# Patient Record
Sex: Female | Born: 1955
Health system: Southern US, Community
[De-identification: ages and names within clinical notes are randomized; demographics above are authoritative.]

## PROBLEM LIST (undated history)

## (undated) DIAGNOSIS — J449 Chronic obstructive pulmonary disease, unspecified: Secondary | ICD-10-CM

## (undated) DIAGNOSIS — C3490 Malignant neoplasm of unspecified part of unspecified bronchus or lung: Secondary | ICD-10-CM

## (undated) DIAGNOSIS — Z923 Personal history of irradiation: Secondary | ICD-10-CM

## (undated) DIAGNOSIS — C801 Malignant (primary) neoplasm, unspecified: Secondary | ICD-10-CM

## (undated) DIAGNOSIS — K56609 Unspecified intestinal obstruction, unspecified as to partial versus complete obstruction: Secondary | ICD-10-CM

## (undated) HISTORY — DX: Malignant neoplasm of unspecified part of unspecified bronchus or lung: C34.90

## (undated) HISTORY — PX: COLON SURGERY: SHX602

---

## 2003-01-25 ENCOUNTER — Inpatient Hospital Stay (HOSPITAL_COMMUNITY): Admission: AD | Admit: 2003-01-25 | Discharge: 2003-01-29 | Payer: Self-pay | Admitting: Emergency Medicine

## 2003-12-09 ENCOUNTER — Ambulatory Visit: Payer: Self-pay

## 2004-01-12 ENCOUNTER — Emergency Department: Payer: Self-pay | Admitting: Emergency Medicine

## 2004-01-27 ENCOUNTER — Emergency Department: Payer: Self-pay | Admitting: Emergency Medicine

## 2004-04-07 ENCOUNTER — Emergency Department: Payer: Self-pay | Admitting: Emergency Medicine

## 2004-07-10 ENCOUNTER — Emergency Department: Payer: Self-pay | Admitting: Emergency Medicine

## 2004-07-10 ENCOUNTER — Other Ambulatory Visit: Payer: Self-pay

## 2004-10-28 ENCOUNTER — Emergency Department: Payer: Self-pay | Admitting: Emergency Medicine

## 2004-11-18 ENCOUNTER — Emergency Department: Payer: Self-pay | Admitting: Emergency Medicine

## 2005-02-01 ENCOUNTER — Ambulatory Visit: Payer: Self-pay

## 2005-04-05 ENCOUNTER — Emergency Department: Payer: Self-pay | Admitting: Emergency Medicine

## 2005-04-15 ENCOUNTER — Emergency Department: Payer: Self-pay | Admitting: Emergency Medicine

## 2005-05-29 ENCOUNTER — Emergency Department: Payer: Self-pay | Admitting: Unknown Physician Specialty

## 2005-12-12 ENCOUNTER — Emergency Department: Payer: Self-pay | Admitting: Emergency Medicine

## 2006-03-08 ENCOUNTER — Emergency Department: Payer: Self-pay | Admitting: Emergency Medicine

## 2006-05-08 ENCOUNTER — Emergency Department: Payer: Self-pay | Admitting: Emergency Medicine

## 2006-12-18 ENCOUNTER — Ambulatory Visit: Payer: Self-pay

## 2007-02-23 ENCOUNTER — Emergency Department: Payer: Self-pay | Admitting: Internal Medicine

## 2007-05-01 ENCOUNTER — Emergency Department: Payer: Self-pay | Admitting: Emergency Medicine

## 2007-05-01 ENCOUNTER — Other Ambulatory Visit: Payer: Self-pay

## 2009-03-25 ENCOUNTER — Emergency Department: Payer: Self-pay | Admitting: Emergency Medicine

## 2010-02-14 ENCOUNTER — Encounter: Payer: Self-pay | Admitting: Family Medicine

## 2010-06-11 NOTE — Consult Note (Signed)
NAME:  [name] [name], [name] [name]                            ACCOUNT NO.:  [id number]   MEDICAL RECORD NO.:  [id number]                   PATIENT TYPE:  INP   LOCATION:  5727                                 FACILITY:  [facility]   PHYSICIAN:  Iva Boop, M.D. [facility]           DATE OF BIRTH:  1955-02-14   DATE OF CONSULTATION:  01/27/2003  DATE OF DISCHARGE:                                   CONSULTATION   REQUESTING PHYSICIAN:  Medical teaching service.   REASON FOR CONSULTATION:  Right lower quadrant abdominal pain.   HISTORY:  This is a 55 year old African-American woman that has had about a  two-week history of worsening abdominal pain in the right lower quadrant.  Obvious diarrhea, though she is telling me she is moving her bowels three to  four times a day now and normally was two and formed, and her bowel  movements now are loose.  She was having some sweats at night.  Her appetite  has been okay.  There is nausea and vomiting most of the time, though  sometimes when she eats she is nauseous.  There has been no bleeding or  melena.  She has a history of arthritis of the knees, sickle cell trait, and  bilateral tubal ligation.  She was admitted to the hospital with these  problems on January 1.  The initial says blood-tinged diarrhea, however.  She denied bleeding to me.  It looks like the history is inconsistent.  The  initial admit note talks about six to seven watery bowel movements per day.   She has not traveled.  She has no sick contacts, no fever or chills reported  other than these sweats.  There is no constipation.  She has never had  problems like this before, and her family history is negative for  inflammatory bowel disease or colon cancer.   A CT scan has demonstrated inflammatory process of the cecum and terminal  ileum.  The appendix was visualized.  The radiologist did not think that  appendicitis was ruled out.  Dr. Johna Sheriff, of surgery, has reviewed that and  believes that  the appendix is normal.  He does not think she has  appendicitis.   Today. E. coli 157, Shiga toxin test has returned positive or at least EHEC  toxin has returned positive.   MEDICATIONS:  At home, she was on no medications.   DRUG ALLERGIES:  ASPIRIN and CELEBREX make her itch.   HOSPITAL MEDICATIONS:  IV Cipro and Flagyl, nicotine patch, IV Protonix,  morphine as needed, Phenergan as needed.   FAMILY HISTORY:  As above.  A sister died of cancer and had lupus  apparently.   REVIEW OF SYSTEMS:  No weight change.  No GU symptoms.  Chronic knee pain,  muscle pain in the thighs.  No respiratory difficulty.  All other systems  appear negative at this time.   PHYSICAL EXAMINATION:  GENERAL:  A middle-aged black woman in no acute  distress.  VITAL SIGNS:  Temperature 98.8, blood pressure 122/50, pulse 50.  HEENT:  Eyes anicteric.  Mouth free of lesions.  NECK:  Supple.  CHEST:  Clear.  HEART:  S1, S2.  ABDOMEN:  Soft.  She is tender to even light touch in the right lower  quadrant, right mid quadrant, and suprapubic area.  There is no mass.  The  abdomen is soft and otherwise benign to palpation.  RECTAL:  Not performed.  Heme positive on several specimens.  EXTREMITIES:  No edema.  I see no rash.  NEUROLOGIC:  She appears alert and oriented x 3.   LABORATORY DATA:  White count today 8.2, hemoglobin 11.6, hematocrit 33.  Normal LFTs.  Drug screen positive for THC and cocaine.  Stool C. difficile  negative thus far.  Fecal leukocytes negative.  EHEC toxin is positive as  mentioned.  Alcohol level was less than 5.   Initial laboratories showed a calcium of 8.2, albumin 3.2, total protein  5.6.  Initial white count 10.9, hemoglobin 14.3.  Amylase and lipase normal  or low.  Urinalysis: Small amount of ketones, 21 to 50 white cells, moderate  leukocyte esterase.   IMPRESSION:  Inflammatory changes of the ileum and cecum, etiology unclear.  Differential still includes an  enterohemorrhagic colitis.  It also includes  Crohn's disease or ischemia.  This is a somewhat unusual area for ischemia,  but given her cocaine use, I think that increases the risk.   RECOMMENDATIONS AND PLAN:  They re-incubated her culture, so would like to  see if the stool culture grows anything prior to any colonoscopy, though she  may need a colonoscopy to sort this out.  Will follow up tomorrow.                                               Iva Boop, M.D. [facility]    CEG/MEDQ  D:  01/27/2003  T:  01/27/2003  Job:  [id number]   cc:   Medical Teaching Service   Cliffton Asters, M.D.  [address] [place]  [place] [address]  Fax: [phone]

## 2010-06-11 NOTE — Discharge Summary (Signed)
NAME:  [name], [name]                            ACCOUNT NO.:  [id number]   MEDICAL RECORD NO.:  [id number]                   PATIENT TYPE:  INP   LOCATION:  5727                                 FACILITY:  [facility]   PHYSICIAN:  Donald Pore, MD                    DATE OF BIRTH:  09-25-1955   DATE OF ADMISSION:  01/25/2003  DATE OF DISCHARGE:  01/29/2003                                 DISCHARGE SUMMARY   DISCHARGE DIAGNOSES:  Escherichia coli infection.   DISCHARGE MEDICATIONS:  None.   HISTORY OF PRESENT ILLNESS:  A 55 year old female presented to the ED with  worsening abdominal pain x 2 weeks.  No vomiting.  Positive nausea.  Positive diarrhea x 1 day.   HOSPITAL COURSE:  1. Abdominal pain.  Upon admission to the ED, the patient was started     empirically on antibiotics including Cipro and Flagyl.  Stools were     cultured prior to antibiotics.  She was placed on bowel rest and given     morphine sulfate for pain.  She was hydrated with IV fluids and     electrolyte status was monitored.  She was also evaluated for GC, and     Chlamydia, and possible pregnancy.  On January 25, 2003, we obtained a CT     scan which showed a inflammatory process of her right lower quadrant,     questionable inflammatory bowel disease versus colitis.  Surgical consult     was obtained.  The patient was placed on a clear liquid diet and     consulted Dr. Johna Sheriff.  Additionally, a GI consult was obtained.  She     was seen by Dr. Leone Payor.  __________  .  January 1 - January 27, 2003, Ms.     [name]'s stools were positive for __________  1 and 2 and negative for E.     coli 157/H7.  On the day of discharge, [name] [name] was medically improved.     She denied any diarrhea, and her right lower quadrant pain was completely     resolved.  The antibiotics were discontinued, and she was discharged to     home in stable condition.   1. Polysubstance abuse.  During her hospitalization, [name] [name] tested     positive  for cocaine use.  She denied any use of the substance.  She was     counseled and encouraged to refrain from abusing cocaine.  [name] [name] did     affirm a use of tobacco products and smoking cessation consult was     obtained.  She is discharged to home with instructions regarding smoking     cessation and on a nicotine patch.  Donald Pore, MD    HP/MEDQ  D:  03/28/2003  T:  03/30/2003  Job:  [id number]

## 2010-06-11 NOTE — Consult Note (Signed)
NAME:  [name], [name]                            ACCOUNT NO.:  [id number]   MEDICAL RECORD NO.:  [id number]                   PATIENT TYPE:  INP   LOCATION:  5727                                 FACILITY:  [facility]   PHYSICIAN:  Sharlet Salina T. Hoxworth, M.D.          DATE OF BIRTH:  02-02-1955   DATE OF CONSULTATION:  DATE OF DISCHARGE:                                   CONSULTATION   CHIEF COMPLAINT:  Right lower-quadrant abdominal pain.   HISTORY OF PRESENT ILLNESS:  I was asked to see [name] [name] by Dr.  Blanch Media.  She is a 55 year old black female who as admitted to the  Medical Teaching Service for abdominal pain.  She describes the onset of  right lower-quadrant abdominal pain about two weeks ago.  It was initially  mild, but has gradually increased in intensity.  It waxes and wanes.  She  describes an aching pain without radiation.  The pain steadily increases in  intensity.  She presented to the emergency room and was admitted on January 25, 2003.  She has had some nausea over the past week, but no vomiting.  She  currently actually is hungry.  Her bowel movements had been normal with no  diarrhea, melena or hematochezia until she had CT contrast which caused some  diarrhea.  She denies any chronic GI complaints or history of any similar  symptoms.  She has been on Cipro and Flagyl since admission. Her pain  remains about the same for the past 24 hours.   PAST MEDICAL HISTORY:  1. Status post tubal ligation.  2. She had been treated for kidney stones.  3. She has sickle cell trait.  4. She has been treated for arthritis involving her knees.   CURRENT MEDICATIONS:  None.   ALLERGIES:  ASPIRIN.   SOCIAL HISTORY:  She is widowed.  She works as a Social research officer, government.  She smokes  cigarettes daily and drinks alcohol occasionally.   FAMILY HISTORY:  Negative for inflammatory bowel disease or other GI  problems.  History of hypertension and diabetes in her father.  She had a  sister die  of cancer of unknown type and had lupus.   REVIEW OF SYSTEMS:  No weight change, no fever or chills.  SKIN:  Denies  rash, infection or skin lesions.  RESPIRATORY:  Denies shortness of breath,  cough or wheezing.  GU:  Denies urinary burning or frequency.  No abnormal  bleeding or discharge.  ORTHOPEDICS:  She has chronic pain in her joints and  muscle pain in her thighs.   PHYSICAL EXAMINATION:  VITAL SIGNS:  She is afebrile.  Vital signs are  within normal limits.  GENERAL:  She is a thin, well-developed black female who does not appear in  any distress.  SKIN:  Warm and dry without rash or infection.  HEENT:  No palpable masses or thyromegaly.  Sclerae nonicteric.  Nasopharynx  clear.  LYMPH NODES:  No cervical, supraclavicular or inguinal nodes palpable.  LUNGS:  Clear to auscultation without increased work of breathing.  CARDIAC:     Regular rate and rhythm without murmurs.  No edema.  ABDOMEN:  Bowel sounds are active, nondistended.  There is well localized  but marked right lower quadrant tenderness with guarding and some local  peritoneal signs.  The remainder of her abdomen is soft and nontender.  I  can not appreciate any mass, but she does guard.  No hepatosplenomegaly is  noted.  EXTREMITIES:  No joint swelling or inflammation.  NEUROLOGIC:  Alert.  Sensory exam is grossly normal.   LABORATORY DATA:  White count 10.9 on admission, now 8.4.  Hemoglobin 11.  Electrolytes, LFT's, lipase, amylase are all within normal limits.  Urinalysis showed 7-10 white cells and a few bacteria.  Stool negative for  white cells.  Stool culture is pending.   I reviewed her CT scan.  This shows extensive inflammatory changes involving  mostly the cecum, extending somewhat up the right colon and possibly  involving a little bit of the terminal ileum.  The appendix appears to be  visualized, has contrast within it and does not appear inflamed.   ASSESSMENT/PLAN:  Acute colitis, possibly  Crohn's disease, although the  distribution is more colonic at the terminal ileum.  She has not had chronic  symptoms.  She has however had arthritis which could indicate  extraintestinal manifestation.  She certainly could have infectious or  ischemic colitis.  I strongly doubt appendicitis based on the location of  the inflammation, the concourse and the appearance of the appendix on CT.  I  do not see that she has a surgical abdomen currently.  I agree with the  current management.  For further workup, I might consider GI consultation  for colonoscopy for small bowel follow through barium x-ray to further  evaluate if this does not resolve quickly.                                               Lorne Skeens. Hoxworth, M.D.    [name] [name]  D:  01/26/2003  T:  01/27/2003  Job:  [id number]

## 2011-05-12 ENCOUNTER — Ambulatory Visit: Payer: Self-pay | Admitting: Internal Medicine

## 2011-05-24 ENCOUNTER — Emergency Department: Payer: Self-pay | Admitting: Emergency Medicine

## 2012-02-07 ENCOUNTER — Emergency Department: Payer: Self-pay | Admitting: Emergency Medicine

## 2012-02-07 LAB — URINALYSIS, COMPLETE
Bilirubin,UR: NEGATIVE
Blood: NEGATIVE
Glucose,UR: NEGATIVE mg/dL (ref 0–75)
Ketone: NEGATIVE
Nitrite: NEGATIVE
Ph: 5 (ref 4.5–8.0)
Protein: NEGATIVE
RBC,UR: 3 /HPF (ref 0–5)
Specific Gravity: 1.014 (ref 1.003–1.030)
Squamous Epithelial: 2
WBC UR: 12 /HPF (ref 0–5)

## 2012-02-07 LAB — RAPID INFLUENZA A&B ANTIGENS

## 2012-02-09 LAB — URINE CULTURE

## 2012-07-31 ENCOUNTER — Ambulatory Visit: Payer: Self-pay | Admitting: Internal Medicine

## 2013-04-17 ENCOUNTER — Emergency Department: Payer: Self-pay | Admitting: Emergency Medicine

## 2013-06-14 ENCOUNTER — Emergency Department: Payer: Self-pay | Admitting: Emergency Medicine

## 2013-06-14 LAB — URINALYSIS, COMPLETE
Bacteria: NONE SEEN
Bilirubin,UR: NEGATIVE
Glucose,UR: NEGATIVE mg/dL (ref 0–75)
Ketone: NEGATIVE
Leukocyte Esterase: NEGATIVE
Nitrite: NEGATIVE
Ph: 8 (ref 4.5–8.0)
Protein: NEGATIVE
RBC,UR: NONE SEEN /HPF (ref 0–5)
Specific Gravity: 1.008 (ref 1.003–1.030)
Squamous Epithelial: 1
WBC UR: 1 /HPF (ref 0–5)

## 2013-06-14 LAB — COMPREHENSIVE METABOLIC PANEL
Albumin: 3.5 g/dL (ref 3.4–5.0)
Alkaline Phosphatase: 56 U/L
Anion Gap: 3 — ABNORMAL LOW (ref 7–16)
BUN: 6 mg/dL — ABNORMAL LOW (ref 7–18)
Bilirubin,Total: 0.3 mg/dL (ref 0.2–1.0)
Calcium, Total: 8.8 mg/dL (ref 8.5–10.1)
Chloride: 109 mmol/L — ABNORMAL HIGH (ref 98–107)
Co2: 29 mmol/L (ref 21–32)
Creatinine: 0.64 mg/dL (ref 0.60–1.30)
EGFR (African American): >60
EGFR (Non-African Amer.): >60
Glucose: 99 mg/dL (ref 65–99)
Osmolality: 279 (ref 275–301)
Potassium: 3.8 mmol/L (ref 3.5–5.1)
SGOT(AST): 23 U/L (ref 15–37)
SGPT (ALT): 14 U/L (ref 12–78)
Sodium: 141 mmol/L (ref 136–145)
Total Protein: 6.7 g/dL (ref 6.4–8.2)

## 2013-06-14 LAB — DRUG SCREEN, URINE

## 2013-06-14 LAB — CBC
HCT: 39.6 % (ref 35.0–47.0)
HGB: 13.4 g/dL (ref 12.0–16.0)
MCH: 34.7 pg — ABNORMAL HIGH (ref 26.0–34.0)
MCHC: 33.9 g/dL (ref 32.0–36.0)
MCV: 102 fL — ABNORMAL HIGH (ref 80–100)
Platelet: 296 10*3/uL (ref 150–440)
RBC: 3.86 10*6/uL (ref 3.80–5.20)
RDW: 12.6 % (ref 11.5–14.5)
WBC: 9.2 10*3/uL (ref 3.6–11.0)

## 2013-06-14 LAB — TROPONIN I
Troponin-I: 0.06 ng/mL — ABNORMAL HIGH
Troponin-I: 0.06 ng/mL — ABNORMAL HIGH

## 2013-06-20 ENCOUNTER — Emergency Department: Payer: Self-pay | Admitting: Emergency Medicine

## 2013-06-20 LAB — CBC
HCT: 38.2 % (ref 35.0–47.0)
HGB: 13 g/dL (ref 12.0–16.0)
MCH: 34.3 pg — ABNORMAL HIGH (ref 26.0–34.0)
MCHC: 34 g/dL (ref 32.0–36.0)
MCV: 101 fL — ABNORMAL HIGH (ref 80–100)
Platelet: 331 10*3/uL (ref 150–440)
RBC: 3.78 10*6/uL — ABNORMAL LOW (ref 3.80–5.20)
RDW: 12.4 % (ref 11.5–14.5)
WBC: 11 10*3/uL (ref 3.6–11.0)

## 2013-06-20 LAB — BASIC METABOLIC PANEL
Anion Gap: 9 (ref 7–16)
BUN: 13 mg/dL (ref 7–18)
Calcium, Total: 8.4 mg/dL — ABNORMAL LOW (ref 8.5–10.1)
Chloride: 109 mmol/L — ABNORMAL HIGH (ref 98–107)
Co2: 23 mmol/L (ref 21–32)
Creatinine: 0.6 mg/dL (ref 0.60–1.30)
EGFR (African American): >60
EGFR (Non-African Amer.): >60
Glucose: 117 mg/dL — ABNORMAL HIGH (ref 65–99)
Osmolality: 282 (ref 275–301)
Potassium: 3.6 mmol/L (ref 3.5–5.1)
Sodium: 141 mmol/L (ref 136–145)

## 2013-06-20 LAB — D-DIMER(ARMC): D-Dimer: 391 ng/ml

## 2013-06-20 LAB — TROPONIN I: Troponin-I: <0.02 ng/mL

## 2013-06-20 LAB — PRO B NATRIURETIC PEPTIDE: B-Type Natriuretic Peptide: 243 pg/mL — ABNORMAL HIGH (ref 0–125)

## 2013-08-01 ENCOUNTER — Ambulatory Visit: Payer: Self-pay | Admitting: Internal Medicine

## 2013-08-22 ENCOUNTER — Ambulatory Visit: Payer: Self-pay | Admitting: Internal Medicine

## 2013-09-28 ENCOUNTER — Emergency Department: Payer: Self-pay | Admitting: Emergency Medicine

## 2013-09-28 LAB — BASIC METABOLIC PANEL
Anion Gap: 10 (ref 7–16)
BUN: 10 mg/dL (ref 7–18)
Calcium, Total: 9.2 mg/dL (ref 8.5–10.1)
Chloride: 106 mmol/L (ref 98–107)
Co2: 23 mmol/L (ref 21–32)
Creatinine: 0.77 mg/dL (ref 0.60–1.30)
EGFR (African American): >60
EGFR (Non-African Amer.): >60
Glucose: 114 mg/dL — ABNORMAL HIGH (ref 65–99)
Osmolality: 277 (ref 275–301)
Potassium: 3.8 mmol/L (ref 3.5–5.1)
Sodium: 139 mmol/L (ref 136–145)

## 2013-09-28 LAB — PRO B NATRIURETIC PEPTIDE: B-Type Natriuretic Peptide: 255 pg/mL — ABNORMAL HIGH (ref 0–125)

## 2013-09-28 LAB — CBC
HCT: 44.3 % (ref 35.0–47.0)
HGB: 14.5 g/dL (ref 12.0–16.0)
MCH: 33 pg (ref 26.0–34.0)
MCHC: 32.7 g/dL (ref 32.0–36.0)
MCV: 101 fL — ABNORMAL HIGH (ref 80–100)
Platelet: 348 10*3/uL (ref 150–440)
RBC: 4.4 10*6/uL (ref 3.80–5.20)
RDW: 12.7 % (ref 11.5–14.5)
WBC: 16.4 10*3/uL — ABNORMAL HIGH (ref 3.6–11.0)

## 2013-09-28 LAB — TROPONIN I: Troponin-I: <0.02 ng/mL

## 2013-09-28 LAB — PROTIME-INR
INR: 1
Prothrombin Time: 12.6 secs (ref 11.5–14.7)

## 2013-09-29 LAB — URINALYSIS, COMPLETE
Bilirubin,UR: NEGATIVE
Blood: NEGATIVE
Glucose,UR: NEGATIVE mg/dL (ref 0–75)
Ketone: NEGATIVE
Nitrite: NEGATIVE
Ph: 5 (ref 4.5–8.0)
Protein: NEGATIVE
RBC,UR: 4 /HPF (ref 0–5)
Specific Gravity: 1.005 (ref 1.003–1.030)
Squamous Epithelial: 7
WBC UR: 55 /HPF (ref 0–5)

## 2013-09-29 LAB — DRUG SCREEN, URINE

## 2013-09-29 LAB — ETHANOL: Ethanol: <3 mg/dL

## 2013-10-11 ENCOUNTER — Emergency Department: Payer: Self-pay | Admitting: Emergency Medicine

## 2013-10-11 LAB — URINALYSIS, COMPLETE
Bilirubin,UR: NEGATIVE
Blood: NEGATIVE
Glucose,UR: NEGATIVE mg/dL (ref 0–75)
Ketone: NEGATIVE
Nitrite: NEGATIVE
Ph: 6 (ref 4.5–8.0)
Protein: NEGATIVE
RBC,UR: 3 /HPF (ref 0–5)
Specific Gravity: 1.017 (ref 1.003–1.030)
Squamous Epithelial: 1
WBC UR: 15 /HPF (ref 0–5)

## 2013-10-11 LAB — CBC WITH DIFFERENTIAL/PLATELET
Basophil #: 0.1 10*3/uL (ref 0.0–0.1)
Basophil %: 0.8 %
Eosinophil #: 0.1 10*3/uL (ref 0.0–0.7)
Eosinophil %: 0.9 %
HCT: 41.6 % (ref 35.0–47.0)
HGB: 13.7 g/dL (ref 12.0–16.0)
Lymphocyte #: 2.8 10*3/uL (ref 1.0–3.6)
Lymphocyte %: 26.6 %
MCH: 33 pg (ref 26.0–34.0)
MCHC: 32.9 g/dL (ref 32.0–36.0)
MCV: 101 fL — ABNORMAL HIGH (ref 80–100)
Monocyte #: 0.7 x10 3/mm (ref 0.2–0.9)
Monocyte %: 6.2 %
Neutrophil #: 7 10*3/uL — ABNORMAL HIGH (ref 1.4–6.5)
Neutrophil %: 65.5 %
Platelet: 275 10*3/uL (ref 150–440)
RBC: 4.13 10*6/uL (ref 3.80–5.20)
RDW: 12.8 % (ref 11.5–14.5)
WBC: 10.7 10*3/uL (ref 3.6–11.0)

## 2013-10-11 LAB — COMPREHENSIVE METABOLIC PANEL
Albumin: 3.3 g/dL — ABNORMAL LOW (ref 3.4–5.0)
Alkaline Phosphatase: 62 U/L
Anion Gap: 7 (ref 7–16)
BUN: 7 mg/dL (ref 7–18)
Bilirubin,Total: 0.3 mg/dL (ref 0.2–1.0)
Calcium, Total: 9.1 mg/dL (ref 8.5–10.1)
Chloride: 107 mmol/L (ref 98–107)
Co2: 26 mmol/L (ref 21–32)
Creatinine: 0.58 mg/dL — ABNORMAL LOW (ref 0.60–1.30)
EGFR (African American): >60
EGFR (Non-African Amer.): >60
Glucose: 103 mg/dL — ABNORMAL HIGH (ref 65–99)
Osmolality: 278 (ref 275–301)
Potassium: 3.4 mmol/L — ABNORMAL LOW (ref 3.5–5.1)
SGOT(AST): 9 U/L — ABNORMAL LOW (ref 15–37)
SGPT (ALT): 14 U/L
Sodium: 140 mmol/L (ref 136–145)
Total Protein: 6.7 g/dL (ref 6.4–8.2)

## 2013-10-11 LAB — LIPASE, BLOOD: Lipase: 97 U/L (ref 73–393)

## 2013-10-28 ENCOUNTER — Ambulatory Visit: Payer: Self-pay | Admitting: Internal Medicine

## 2013-11-04 ENCOUNTER — Emergency Department: Payer: Self-pay | Admitting: Emergency Medicine

## 2013-11-04 LAB — URINALYSIS, COMPLETE
Bacteria: NONE SEEN
Bilirubin,UR: NEGATIVE
Blood: NEGATIVE
Glucose,UR: NEGATIVE mg/dL (ref 0–75)
Ketone: NEGATIVE
Nitrite: NEGATIVE
Ph: 5 (ref 4.5–8.0)
Protein: NEGATIVE
RBC,UR: 18 /HPF (ref 0–5)
Specific Gravity: 1.025 (ref 1.003–1.030)
Squamous Epithelial: 4
WBC UR: 66 /HPF (ref 0–5)

## 2013-11-04 LAB — COMPREHENSIVE METABOLIC PANEL
Albumin: 3.3 g/dL — ABNORMAL LOW (ref 3.4–5.0)
Alkaline Phosphatase: 65 U/L
Anion Gap: 8 (ref 7–16)
BUN: 6 mg/dL — ABNORMAL LOW (ref 7–18)
Bilirubin,Total: 0.3 mg/dL (ref 0.2–1.0)
Calcium, Total: 8.5 mg/dL (ref 8.5–10.1)
Chloride: 105 mmol/L (ref 98–107)
Co2: 30 mmol/L (ref 21–32)
Creatinine: 0.61 mg/dL (ref 0.60–1.30)
EGFR (African American): >60
EGFR (Non-African Amer.): >60
Glucose: 168 mg/dL — ABNORMAL HIGH (ref 65–99)
Osmolality: 286 (ref 275–301)
Potassium: 3.1 mmol/L — ABNORMAL LOW (ref 3.5–5.1)
SGOT(AST): 14 U/L — ABNORMAL LOW (ref 15–37)
SGPT (ALT): 12 U/L — ABNORMAL LOW
Sodium: 143 mmol/L (ref 136–145)
Total Protein: 6.8 g/dL (ref 6.4–8.2)

## 2013-11-04 LAB — CBC
HCT: 39.7 % (ref 35.0–47.0)
HGB: 13.5 g/dL (ref 12.0–16.0)
MCH: 33.4 pg (ref 26.0–34.0)
MCHC: 33.9 g/dL (ref 32.0–36.0)
MCV: 99 fL (ref 80–100)
Platelet: 365 10*3/uL (ref 150–440)
RBC: 4.03 10*6/uL (ref 3.80–5.20)
RDW: 12.8 % (ref 11.5–14.5)
WBC: 8.4 10*3/uL (ref 3.6–11.0)

## 2013-11-04 LAB — LIPASE, BLOOD: Lipase: 124 U/L (ref 73–393)

## 2013-11-15 ENCOUNTER — Ambulatory Visit: Payer: Self-pay | Admitting: Internal Medicine

## 2014-02-20 ENCOUNTER — Ambulatory Visit: Payer: Self-pay | Admitting: Gastroenterology

## 2014-03-16 ENCOUNTER — Emergency Department: Payer: Self-pay | Admitting: Emergency Medicine

## 2014-03-18 ENCOUNTER — Observation Stay: Payer: Self-pay | Admitting: Internal Medicine

## 2014-04-11 ENCOUNTER — Emergency Department: Payer: Self-pay | Admitting: Emergency Medicine

## 2014-05-25 NOTE — Consult Note (Signed)
Pt ate corn, baked chicken, and cream potatoes.  I think she can go home tomorrow on carafate tid and PPI bid.   Since she had an EGD in 1/28 and is not actively bleeding I see no need to repeat the test at this time.  Electronic Signatures: Manya Silvas (MD)  (Signed on 25-Feb-16 18:05)  Authored  Last Updated: 25-Feb-16 18:05 by Manya Silvas (MD)

## 2014-05-25 NOTE — Consult Note (Signed)
PATIENT NAME:  [name] [name], [name] [name] MR#:  [id number] DATE OF BIRTH:  05-02-1955  DATE OF CONSULTATION:  03/20/2014  REFERRING PHYSICIAN:   CONSULTING PHYSICIAN:  Theodore Demark, NP  REASON FOR CONSULTATION: GI consult ordered by Dr. Anselm Jungling for evaluation of peptic ulcer disease/vomiting.   HISTORY OF PRESENT ILLNESS: Appreciate consult for a 59 year old [place] American woman with history of hypertension, COPD, polysubstance abuse follow-up, abdominal pain, nausea, vomiting, diarrhea, for evaluation of abdominal pain, nausea, vomiting. The patient reports abdominal pain, nausea, vomiting, diarrhea been ongoing for the last 3 months with accompanying weight loss of 15 pounds, underwent EGD and colonoscopy 02/20/2014 by Dr. Allen Norris with findings of normal esophagus to gastric ulcer, severe erosive gastritis, and duodenitis. Colonoscopy revealed a normal colon, diverticula and hemorrhoids. Biopsy reports have been obtained and demonstrating some Helicobacter pylori gastritis, unremarkable duodenal mucosa, unremarkable ileal mucosa, unremarkable colonic mucosa. There was no dysplasia or malignancy. She says she has been taking 20 mg of omeprazole daily. She denies all NSAIDs. Reports upper abdominal pain all last week. Smoked some cannabis Saturday. She thought it would help her pain, but states it did not. Her pain became worse and was now associated with emesis but not hematemesis, some diarrhea. States the diarrhea was black, last movement was on Sunday. No bowel movement since then. She says she is feeling better now. Her epigastric pain persists some. She has no other GI-related complaints. Do note that her urine drug screen was positive for cocaine, cannabis, and opiates. She admits to smoking marijuana, but states no knowledge of cocaine. Thinks that maybe one of her bags of marijuana was laced. Denies recent alcohol use. Hemoglobin was normal. BUN/creatinine normal. CT of abdomen and pelvis unremarkable.  Did have some pelvic congestion and uterine fibroids. She has been hemodynamically stable and is receiving proton pump inhibitor therapy, sucralfate, IV fluids, and ceftriaxone for a urinary tract infection.   PAST MEDICAL HISTORY: Alcohol abuse, migraines, hypertension, drug abuse, COPD, peptic ulcer disease.   FAMILY HISTORY: Father had colon cancer. Sister had lupus. Denies family history of liver disease or ulcers.   SOCIAL HISTORY: Smokes 3-4 cigarettes a day. States she rarely drinks alcohol now. Marijuana use as above. Lives alone.   ALLERGIES: ASPIRIN.  REVIEW OF SYSTEMS: Ten systems reviewed. Significant for fatigue, weakness, and what is written above; otherwise unremarkable.   HOME MEDICATIONS: Tramadol 50 mg q. 6 hours p.r.n., Norco 5/325 q. 6 hours p.r.n. Advair 150 one puff b.i.d., Flonase 1 spray once a day, lisinopril 20 mg p.o. daily, Lyrica 50 mg p.o. b.i.d., metoprolol 25 mg b.i.d., Zofran 4 mg q. 8 hours p.r.n., Prilosec 20 mg once a day, Seroquel 50 mg once a day, Ventolin 2 puffs q. 4 hours p.r.n., Zoloft 100 mg once a day.   MOST RECENT LABORATORY DATA: Glucose 90, BUN 8, creatinine 0.78, sodium 139, potassium 3.7, chloride 103, GFR greater than 60, lipase 59, total protein 7.2, albumin 3.6, total bilirubin 0.9, ALP 70, AST 12, ALT 9. Troponin less than 0.02. Urine drug screen positive for cocaine, marijuana, opiates. WBC 11.7, hemoglobin 14.8, hematocrit 44.1, platelet count 343,000, red cells normocytic with normal RDW. CT with no acute abnormalities.   PHYSICAL EXAMINATION: VITAL SIGNS: Most recent: Temperature 98.2, pulse 50, respiratory rate 18, blood pressure 158/64, oxygen saturation 97% on room air.  GENERAL: Well-appearing, middle-aged woman in no acute distress.  HEENT: Normocephalic, atraumatic. Conjunctivae pink. Sclerae are clear.  NECK: Supple. No thyromegaly or JVD.  CARDIAC: S1,  S2, RRR. No MRG.  CHEST: Respirations eupneic.  LUNGS: Clear.  ABDOMEN: Flat  abdomen. Bowel sounds x 4. Some epigastric tenderness. No guarding, rigidity, rebound tenderness, or other peritoneal signs. No hepatosplenomegaly or masses.  SKIN: Warm, dry, pink. No erythema, lesion, or rash.  MUSCULOSKELETAL: Gait steady. Strength 5/5, MAEW x 4.  NEUROLOGICAL: Alert and oriented x 3. Speech clear. No facial droop.  PSYCHIATRIC: Somewhat limited insight, pleasant, calm, cooperative.   IMPRESSION AND PLAN: Epigastric pain, questionable melena, known history of gastric ulcers. Will repeat hemoglobin. BUN and creatinine have been normal. Agree with proton pump inhibitor and sucralfate therapy. Did receive pathology reports. The patient additionally reported that she was treated with antibiotics for 2 weeks by Dr. Allen Norris for her Helicobacter pylori. Did counsel her on side effects of marijuana and cocaine. Strongly advise cessation of use and encouraged her on her alcohol reduction cessation. Did discuss repeating EGD with Dr. Vira Agar. This will not be necessary at the time and will not likely change her clinical management.   Thank you very much for this consult. These services were provided by Stephens November, MSN, [facility], in collaboration with Gaylyn Cheers, MD, with whom I have discussed this patient in full    ____________________________ Theodore Demark, NP chl:mw D: 03/20/2014 97:98:92 ET T: 03/20/2014 18:24:31 ET JOB#: [id number]  cc: Theodore Demark, NP, <Dictator> [place] SIGNED 03/27/2014 10:09

## 2014-05-25 NOTE — H&P (Signed)
PATIENT NAME:  [name] [name], [name] [name] MR#:  [id number] DATE OF BIRTH:  Dec 23, 1955  DATE OF ADMISSION:  03/18/2014  PRIMARY CARE PHYSICIAN: Cletis Athens, MD    GASTROENTEROLOGIST: Lucilla Lame, MD   CHIEF COMPLAINT: Abdominal pain, nausea, vomiting, weight loss.   HISTORY OF PRESENTING ILLNESS: A 59 year old African American female patient with history of depression, alcohol and tobacco abuse, hypertension, presents to the Emergency Room with complaints of recurrent bouts of abdominal pain, nausea, and vomiting. She mentions that her symptoms started around Thanksgiving. She has had recurrent abdominal pain, nausea, vomiting and recently had an EGD, colonoscopy which showed severe gastritis, duodenitis, with gastric ulcers without any bleeding, also had internal hemorrhoids. Was placed on PPIs. Was also treated for Helicobacter pylori with antibiotics for 2 weeks, but her symptoms have not improved and unable to keep any food or liquids down with dehydration and pain. The patient is being admitted to the hospitalist service.   The patient mentioned that she lost about 15 pounds since November. Biopsy results are not available. She mentions that she felt better while taking the antibiotics and worsened once they stopped the antibiotics.   Today, in the Emergency Room, the patient had a CT scan of the abdomen done, which showed nothing acute, no masses, no obstruction. Initially, the family requested that the patient be transferred to [facility], but due to unavailability of beds, the hospitalist team here has been consulted.   The patient also lives alone, has been extremely weak and needed 2 people to help her ambulate due to the generalized weakness.   PAST MEDICAL HISTORY: 1. Alcohol abuse. The patient used to drink more than 6 beers a day until recently. Now, this has decreased to once or twice a week.  2. Migraines.  3. Hypertension.  4. Drug abuse, including marijuana.  5. COPD.  6. Peptic ulcer disease.    FAMILY HISTORY: Hypertension.   SOCIAL HISTORY: The patient continues to smoke 3-4 cigarettes a day. Drinks mostly on weekends now. Uses marijuana. Lives alone, uses a cane for ambulation. Code status: Full code.   ALLERGIES: ASPIRIN.   REVIEW OF SYSTEMS:  CONSTITUTIONAL: Complains of fatigue, weakness, and weight loss.  EYES: No blurred vision, pain or icterus.  EARS, NOSE, AND THROAT: No tinnitus, ear pain, or hearing loss.  RESPIRATORY: No cough, wheeze, hemoptysis.  CARDIOVASCULAR: No chest pain, orthopnea, edema,  GASTROINTESTINAL: Has nausea, vomiting, abdominal pain.  GENITOURINARY: No dysuria, hematuria.  ENDOCRINE: No polyuria, nocturia, thyroid problems.  HEMATOLOGIC AND LYMPHATIC: No anemia, easy bruising, bleeding INTEGUMENTARY: No acne, rash, lesion.  MUSCULOSKELETAL: No back pain or arthritis.  NEUROLOGIC: No focal numbness, weakness.  PSYCHIATRIC: Has anxiety, depression.   HOME MEDICATIONS: 1. Tramadol 50 mg q. 6 hours as needed.  2. Acetaminophen/hydrocodone 325/5 one tablet every 6 hours as needed.  3. Advair Diskus 150, 1-2 inhaled 2 times a day.  4. Flonase 1 spray nasal once a day.  5. Lisinopril 20 mg daily.  6. Lyrica 50 mg oral 2 times a day.  7. Metoprolol tartrate 25 mg oral 2 times a day.  8. Zofran 4 mg oral every 8 hours as needed.  9. Prilosec 20 mg oral once a day.  10. Seroquel 50 mg oral once a day.  11. Ventolin 2 puffs inhaled 4 times a day as needed.  12. Zoloft 100 mg oral once a day.   PHYSICAL EXAMINATION: VITAL SIGNS: Temperature 99, pulse of 76, blood pressure 153/72, saturating 100% on room air.  GENERAL: Thin African American female patient lying in bed in mild distress secondary to her abdominal pain.  PSYCHIATRIC: Alert, awake, pleasant.  HEENT: Atraumatic, normocephalic. Oral mucosa are dry and pink. External ears and nose normal. No pallor. No icterus. Pupils are equal and reactive to light.  NECK: Supple. No thyromegaly. No  palpable lymph nodes. Trachea midline. No carotid bruits, no JVD.  CARDIOVASCULAR: S1, S2, without any murmurs. Peripheral pulses 2+. No edema.  RESPIRATORY: Normal work of breathing. Clear to auscultation on both sides.  GASTROINTESTINAL: Soft abdomen. Tenderness in the epigastric area without any rigidity or guarding. No hepatosplenomegaly palpable.  GENITOURINARY: No CVA tenderness or bladder distention.  SKIN: Warm and dry. No petechiae, rash, ulcers.  MUSCULOSKELETAL: No joint swelling, redness, effusion of the large joints.  LYMPHATIC: No cervical lymphadenopathy.   LABORATORY DATA AND IMAGING STUDIES: CT scan of the abdomen and pelvis with contrast showed no acute abnormality. There is some prominence of the left gonadal veins, similar to prior exam. Unchanged 1.4 cm fibroid, stable. Glucose of 90, BUN 8, creatinine 0.78, sodium 139, potassium 3.7, chloride 103. AST, ALT, alkaline phosphatase, bilirubin normal. Troponin less than 0.02. WBC 11.7, hemoglobin 14.8, platelets of 343,000.   ASSESSMENT AND PLAN: 1. Nausea, vomiting, abdominal pain secondary to the patient's gastritis, duodenitis, and peptic ulcer disease. At this time, her biopsy results are not available. She needs to follow up with Dr. Allen Norris regarding this, but CT does not show any mass or lymphadenopathy. Her symptoms are likely secondary from her severe findings on the EGD, but the patient has continued to smoke and drink alcohol. Also, her proton pump inhibitors were stopped a few days back. At this point, due to her dehydration, unable to keep any food or liquids down, and significant weakness, we will admit the patient under observation. We will get her started on PPIs, add sucralfate. We will put her on IV fluids. I will use scheduled Reglan for the next 24 hours. Get her on a regular diet, have physical therapy see the patient. Hopefully, If the patient's symptoms are improved, she can be discharged in the next 1-2 days. I have  had a long discussion with the patient and her caretaker at bedside, regarding her symptoms and the fact that the symptoms can take many days to even weeks to completely resolve, and she needs to quit alcohol and smoking and be compliant with her medications.  2. Uncontrolled hypertension. The patient has been unable to keep her medications down. We will restart her medications, use IV p.r.n. medications.  3. Depression. Continue home medication.  4. Tobacco abuse. The patient was counseled to quit smoking for greater than 3 minutes.  5. Deep vein thrombosis prophylaxis with Lovenox.   CODE STATUS: Full code.   TIME SPENT TODAY ON THIS CASE: 45 minutes again.    ____________________________ Leia Alf. Dub Maclellan, MD srs:mw D: 03/18/2014 14:46:43 ET T: 03/18/2014 15:31:40 ET JOB#: [id number]  cc: Alveta Heimlich R. Sherrie Marsan, MD, <Dictator> Cletis Athens, MD Lucilla Lame, MD Neita Carp MD ELECTRONICALLY SIGNED 03/18/2014 19:15

## 2014-05-25 NOTE — Discharge Summary (Signed)
PATIENT NAME:  [name] [name], [name] [name] MR#:  [id number] DATE OF BIRTH:  [birth year]  DATE OF ADMISSION:  03/18/2014 DATE OF DISCHARGE:  03/21/2014  DISCHARGE DIAGNOSES: 1.  Intractable vomiting. Known to have gastritis and peptic ulcer disease.  2.  Urinary tract infection.  3.  Hypertension.  4.  Weight loss and depression.  DISCHARGE MEDICATIONS: 1.  Zoloft 100 mg oral once a day.  2.  Ventolin inhalation 2 puffs 4 times a day as needed for shortness of breath.  3.  Seroquel 50 mg oral tablet once a day.  4.  Lisinopril 20 mg oral once a day.  5.  Fluticasone nasal spray once a day.  6.  Advair Diskus 100/50 mcg inhalation 2 times a day.  7.  Lyrica 50 mg oral capsule 2 times a day.  8.  Tramadol 50 mg oral every 4 hours as needed for pain.  9.  Metoprolol 25 mg 2 times a day.  10.  Acetaminophen/hydrocodone 325/5 mg oral every 6 hours as needed for pain.  11.  Ondansetron 4 mg oral tablet every 8 hours for nausea and vomiting.  12.  Ceftin 500 mg oral tablet 2 times a day for 3 days.  13.  Carafate 1 gram oral tablet 4 times a day before meals and at bedtime.  14.  Pantoprazole 40 mg oral delayed-release tablet 2 times a day.  15.  Hydralazine 25 mg every 8 hours.   DIET ON DISCHARGE: Low-sodium, low-fat, low-cholesterol diet, regular consistency.   ACTIVITY: As tolerated.   TIMEFRAME TO FOLLOWUP: Within 1 to 2 weeks in gastroenterology clinic.  HISTORY OF PRESENTING ILLNESS: A 58 year old African American female with history of depression, alcohol and tobacco abuse, and hypertension who presented to the Emergency Room with complaint of recurrent bouts of abdominal pain, nausea and vomiting. Started having symptoms around Thanksgiving. Had recurrent abdominal pain and nausea and recently had EGD and colonoscopy which showed severe gastritis, duodenitis and gastric ulcers without bleeding, done by Dr. Allen Norris. Had internal hemorrhoids. She was placed on PPI and also treated for Helicobacter  pylori with an antibiotic for 2 weeks. Symptoms had not improved and she was unable to keep any food or liquid down, presented with dehydration and pain. She said that she lost almost 15 pounds weight since November. Biopsy results were not available on admission. She felt better while taking antibiotic but worsened once stopping antibiotic. In the Emergency Room, CAT scan of abdomen done which showed nothing acute. No mass or obstruction. The patient also had generalized weakness and needed some assistance to ambulate before admission.   HOSPITAL COURSE AND STAY:  1.  Vomiting and weight loss due to gastritis and peptic ulcer disease, recently diagnosed by EGD. The patient was admitted and started on supportive care for pain and nausea management and was seen by a GI doctor. He suggested not to do any intervention at this time as that was done recently within the last 1 month and advised to continue the treatment. The patient was not aware about dietary restrictions. She was still drinking alcohol and eating spicy food and continued smoking. I counseled her extensively repeatedly about these issues and give her stomach some rest, have some mild food and no alcohol to heal from her gastritis and peptic ulcer disease. She agreed for that and advised to follow in a GI clinic in a week or 2. Meanwhile, her symptoms resolved with symptomatic treatment and discharged on continued PPI.  2.  Uncontrolled hypertension.  Initially she was unable to keep oral medications down, but later on with symptomatic management restarted on the medication and that was stable.  3.  Depression. Continued home medication.  4.  Smoking. She was counseled to quit smoking.  5.  Deep venous thrombosis done with Lovenox.   CONSULTS [place]: Gastroenterology with Dr. Vira Agar.   IMPORTANT DIAGNOSTIC DATA: Troponin less than 0.02. WBC 15.7, hemoglobin 14.9, platelet count 350,000, and MCV 98.   Glucose 142, BUN 9, creatinine  0.74, sodium 139, potassium 3.9, chloride 104 and CO2 29.   CT abdomen and pelvis with contrast shows no acute abnormality in abdomen and pelvis, prominence of left gonadal veins similar to prior exam, can be seen in setting of pelvic congestion syndrome, fundal fibroid again noted.  Lipase 59. Urinalysis was positive for 40 WBCs and 3+ leukocyte esterase. Toxicology was positive for cocaine and opiate plus cannabinoid in her urine. Urine culture did not grow any bacteria. Hemoglobin remained stable, on the 25th of February 11.7.   TOTAL TIME SPENT ON THIS DISCHARGE: 40 minutes.  ____________________________ Ceasar Lund Anselm Jungling, MD vgv:sb D: 03/25/2014 10:38:56 ET T: 03/25/2014 10:56:12 ET JOB#: [id number]  cc: Ceasar Lund. Anselm Jungling, MD, <Dictator> Manya Silvas, MD Cletis Athens, MD Lucilla Lame, MD Rosalio Macadamia [facility] MD ELECTRONICALLY SIGNED 04/07/2014 13:00

## 2014-05-25 NOTE — Consult Note (Signed)
Brief Consult Note: Diagnosis: abdominal pain, NVD.   Patient was seen by consultant.   Consult note dictated.   Comments: Appreciate consult for 59 y/o [place] American woman with history of htn, copd, polysubstance abuse, abdominal pain/NVD, for evaluation of abdominal pain/NVD. The abdominal pain and NVD have been ongoing for the last 48mand accompanied with weight loss of 15lb. Underwent EGD and colonsocopy 02/20/14 by Dr WAllen Norrisw/the findings of a normal esophagus, 2 gastric ulcers/severe erosive gastritis, and duodenitis. Colonoscopy revealed a normal TI, diverticula, and hemorrhoids. There are no biopsy reports available. Has been taking Omeprazole '20mg'$  po daily. Denies all NSAIDs. Reports upper abd pain all week, smoked some cannabis Sat as she thought is would help her pain, but didn't- pain became worse and now associated with emesis (not hematemeis) and diarrhea. States diarrhea was black- last was Sunday. No bowel movements since then. Feeling better now- epigastric pain persists some. No other GI complaints. Do note that her UDS was positive for cocaine, cannabis, and opiates. Admits to smoking marijuana but states no knowledge of cocaine- thinks maybe one of her bags of marijuana was laced. Denies recent etoh use. hgb normal. bun/creatinine normal. CT of abdomen/pelvis unremarkable- did have some pelvic congestion and uterine fibroid. hemodynamically stable. receiving PPI and sucralfate, IVF and ceftriaxone for UTI. Impression and plan: Epigastric pain. Possible melena.  Known history of gastric ulcers. Will repeat hgb. BUN & creatinine have been normal. Agree with ppi and sucralfate therapy. Would like biopsy reports. Counselled on side effects of marijuana and cocaine. Strongly advised cessation of use. Encouraged her on her etoh reduction/cessation. May benefit from repeat EGD for luminal reassessment- will d/w Dr ETiffany Kocher  Electronic Signatures: LStephens NovemberH (NP)  (Signed 25-Feb-16  13:25)  Authored: Brief Consult Note   Last Updated: 25-Feb-16 13:25 by LTheodore Demark(NP)

## 2014-05-26 ENCOUNTER — Emergency Department: Payer: Medicare Other

## 2014-05-26 ENCOUNTER — Encounter: Payer: Self-pay | Admitting: *Deleted

## 2014-05-26 ENCOUNTER — Other Ambulatory Visit: Payer: Self-pay

## 2014-05-26 ENCOUNTER — Inpatient Hospital Stay
Admission: EM | Admit: 2014-05-26 | Discharge: 2014-06-02 | DRG: 377 | Disposition: A | Discharge status: Home Health | Payer: Medicare Other | Attending: Specialist | Admitting: Specialist

## 2014-05-26 DIAGNOSIS — E876 Hypokalemia: Secondary | ICD-10-CM | POA: Diagnosis present

## 2014-05-26 DIAGNOSIS — R64 Cachexia: Secondary | ICD-10-CM | POA: Diagnosis present

## 2014-05-26 DIAGNOSIS — F1410 Cocaine abuse, uncomplicated: Secondary | ICD-10-CM | POA: Diagnosis present

## 2014-05-26 DIAGNOSIS — Z79899 Other long term (current) drug therapy: Secondary | ICD-10-CM

## 2014-05-26 DIAGNOSIS — E43 Unspecified severe protein-calorie malnutrition: Secondary | ICD-10-CM | POA: Diagnosis present

## 2014-05-26 DIAGNOSIS — K2900 Acute gastritis without bleeding: Secondary | ICD-10-CM | POA: Diagnosis present

## 2014-05-26 DIAGNOSIS — K921 Melena: Principal | ICD-10-CM | POA: Diagnosis present

## 2014-05-26 DIAGNOSIS — K912 Postsurgical malabsorption, not elsewhere classified: Secondary | ICD-10-CM | POA: Diagnosis present

## 2014-05-26 DIAGNOSIS — K551 Chronic vascular disorders of intestine: Secondary | ICD-10-CM | POA: Diagnosis present

## 2014-05-26 DIAGNOSIS — Z681 Body mass index (BMI) 19 or less, adult: Secondary | ICD-10-CM

## 2014-05-26 DIAGNOSIS — F17210 Nicotine dependence, cigarettes, uncomplicated: Secondary | ICD-10-CM | POA: Diagnosis present

## 2014-05-26 DIAGNOSIS — D649 Anemia, unspecified: Secondary | ICD-10-CM | POA: Diagnosis present

## 2014-05-26 DIAGNOSIS — K649 Unspecified hemorrhoids: Secondary | ICD-10-CM | POA: Diagnosis present

## 2014-05-26 DIAGNOSIS — N390 Urinary tract infection, site not specified: Secondary | ICD-10-CM | POA: Diagnosis not present

## 2014-05-26 DIAGNOSIS — R109 Unspecified abdominal pain: Secondary | ICD-10-CM | POA: Diagnosis present

## 2014-05-26 DIAGNOSIS — J449 Chronic obstructive pulmonary disease, unspecified: Secondary | ICD-10-CM | POA: Diagnosis present

## 2014-05-26 DIAGNOSIS — Z888 Allergy status to other drugs, medicaments and biological substances status: Secondary | ICD-10-CM

## 2014-05-26 DIAGNOSIS — Z931 Gastrostomy status: Secondary | ICD-10-CM

## 2014-05-26 DIAGNOSIS — K5900 Constipation, unspecified: Secondary | ICD-10-CM | POA: Diagnosis present

## 2014-05-26 HISTORY — DX: Chronic obstructive pulmonary disease, unspecified: J44.9

## 2014-05-26 HISTORY — DX: Unspecified intestinal obstruction, unspecified as to partial versus complete obstruction: K56.609

## 2014-05-26 LAB — CBC WITH DIFFERENTIAL/PLATELET
Basophils Absolute: 0.1 10*3/uL (ref 0–0.1)
Basophils Relative: 0 %
Eosinophils Absolute: 0.1 10*3/uL (ref 0–0.7)
Eosinophils Relative: 1 %
HCT: 28 % — ABNORMAL LOW (ref 35.0–47.0)
Hemoglobin: 9.2 g/dL — ABNORMAL LOW (ref 12.0–16.0)
Lymphocytes Relative: 38 %
Lymphs Abs: 4.4 10*3/uL — ABNORMAL HIGH (ref 1.0–3.6)
MCH: 31.5 pg (ref 26.0–34.0)
MCHC: 32.9 g/dL (ref 32.0–36.0)
MCV: 95.8 fL (ref 80.0–100.0)
Monocytes Absolute: 0.8 10*3/uL (ref 0.2–0.9)
Monocytes Relative: 7 %
Neutro Abs: 6.4 10*3/uL (ref 1.4–6.5)
Neutrophils Relative %: 54 %
Platelets: 395 10*3/uL (ref 150–440)
RBC: 2.93 MIL/uL — ABNORMAL LOW (ref 3.80–5.20)
RDW: 14.8 % — ABNORMAL HIGH (ref 11.5–14.5)
WBC: 11.8 10*3/uL — ABNORMAL HIGH (ref 3.6–11.0)

## 2014-05-26 LAB — COMPREHENSIVE METABOLIC PANEL
ALT: 33 U/L (ref 14–54)
AST: 24 U/L (ref 15–41)
Albumin: 2.6 g/dL — ABNORMAL LOW (ref 3.5–5.0)
Alkaline Phosphatase: 78 U/L (ref 38–126)
Anion gap: 6 (ref 5–15)
BUN: 16 mg/dL (ref 6–20)
CO2: 27 mmol/L (ref 22–32)
Calcium: 7.9 mg/dL — ABNORMAL LOW (ref 8.9–10.3)
Chloride: 104 mmol/L (ref 101–111)
Creatinine, Ser: <0.3 mg/dL — ABNORMAL LOW (ref 0.44–1.00)
Glucose, Bld: 97 mg/dL (ref 65–99)
Potassium: 3.8 mmol/L (ref 3.5–5.1)
Sodium: 137 mmol/L (ref 135–145)
Total Bilirubin: 0.3 mg/dL (ref 0.3–1.2)
Total Protein: 5.8 g/dL — ABNORMAL LOW (ref 6.5–8.1)

## 2014-05-26 LAB — LIPASE, BLOOD: Lipase: 40 U/L (ref 22–51)

## 2014-05-26 MED ORDER — SODIUM CHLORIDE 0.9 % IV SOLN
INTRAVENOUS | Status: DC
  Administered 2014-05-26 – 2014-05-31 (×8): via INTRAVENOUS

## 2014-05-26 MED ORDER — ONDANSETRON HCL 4 MG/2ML IJ SOLN
4.0000 mg | Freq: Four times a day (QID) | INTRAMUSCULAR | Status: DC | PRN
  Administered 2014-05-27 – 2014-05-30 (×5): 4 mg via INTRAVENOUS
  Filled 2014-05-26 (×5): qty 2

## 2014-05-26 MED ORDER — VITAMIN B-12 1000 MCG PO TABS
1000.0000 ug | ORAL_TABLET | Freq: Every day | ORAL | Status: DC
  Administered 2014-05-26 – 2014-06-02 (×8): 1000 ug via ORAL
  Filled 2014-05-26 (×8): qty 1

## 2014-05-26 MED ORDER — ALUM & MAG HYDROXIDE-SIMETH 200-200-20 MG/5ML PO SUSP
30.0000 mL | Freq: Four times a day (QID) | ORAL | Status: DC | PRN
  Administered 2014-05-30: 08:00:00 30 mL via ORAL
  Filled 2014-05-26: qty 30

## 2014-05-26 MED ORDER — PANTOPRAZOLE SODIUM 40 MG PO TBEC
40.0000 mg | DELAYED_RELEASE_TABLET | Freq: Every day | ORAL | Status: DC
  Administered 2014-05-26 – 2014-05-29 (×4): 40 mg via ORAL
  Filled 2014-05-26 (×4): qty 1

## 2014-05-26 MED ORDER — CLOPIDOGREL BISULFATE 75 MG PO TABS
75.0000 mg | ORAL_TABLET | Freq: Every day | ORAL | Status: DC
  Administered 2014-05-26 – 2014-06-02 (×8): 75 mg via ORAL
  Filled 2014-05-26 (×8): qty 1

## 2014-05-26 MED ORDER — ACETAMINOPHEN 160 MG/5ML PO SOLN
650.0000 mg | Freq: Four times a day (QID) | ORAL | Status: DC | PRN
  Administered 2014-05-28: 10:00:00 640 mg via ORAL
  Administered 2014-05-29 – 2014-05-31 (×4): 650 mg via ORAL
  Filled 2014-05-26 (×5): qty 20.3

## 2014-05-26 MED ORDER — ONDANSETRON HCL 4 MG PO TABS
4.0000 mg | ORAL_TABLET | Freq: Four times a day (QID) | ORAL | Status: DC | PRN
  Filled 2014-05-26: qty 1

## 2014-05-26 MED ORDER — PREGABALIN 75 MG PO CAPS
75.0000 mg | ORAL_CAPSULE | Freq: Two times a day (BID) | ORAL | Status: DC
  Administered 2014-05-26 – 2014-06-02 (×14): 75 mg via ORAL
  Filled 2014-05-26 (×14): qty 1

## 2014-05-26 MED ORDER — BUDESONIDE-FORMOTEROL FUMARATE 160-4.5 MCG/ACT IN AERO
2.0000 | INHALATION_SPRAY | Freq: Two times a day (BID) | RESPIRATORY_TRACT | Status: DC
  Administered 2014-05-26 – 2014-06-02 (×8): 2 via RESPIRATORY_TRACT
  Filled 2014-05-26: qty 6

## 2014-05-26 MED ORDER — ACETAMINOPHEN 650 MG RE SUPP: 650.0000 mg | Freq: Four times a day (QID) | RECTAL | Status: DC | PRN

## 2014-05-26 MED ORDER — ONDANSETRON HCL 4 MG PO TABS: 8.0000 mg | ORAL_TABLET | Freq: Four times a day (QID) | ORAL | Status: DC | PRN

## 2014-05-26 MED ORDER — DICYCLOMINE HCL 10 MG PO CAPS
10.0000 mg | ORAL_CAPSULE | Freq: Four times a day (QID) | ORAL | Status: DC | PRN
  Administered 2014-05-27 – 2014-05-28 (×2): 10 mg via ORAL
  Filled 2014-05-26 (×2): qty 1

## 2014-05-26 MED ORDER — CYCLOSPORINE 0.05 % OP EMUL
1.0000 [drp] | Freq: Two times a day (BID) | OPHTHALMIC | Status: DC
  Administered 2014-05-26 – 2014-05-29 (×6): 1 [drp] via OPHTHALMIC
  Administered 2014-05-30: 11:00:00 via OPHTHALMIC
  Administered 2014-05-31 – 2014-06-01 (×4): 1 [drp] via OPHTHALMIC
  Administered 2014-06-02: 10:00:00 8 [drp] via OPHTHALMIC
  Filled 2014-05-26 (×16): qty 1

## 2014-05-26 MED ORDER — IOHEXOL 300 MG/ML  SOLN
80.0000 mL | Freq: Once | INTRAMUSCULAR | Status: AC | PRN
  Administered 2014-05-26: 80 mL via INTRAVENOUS

## 2014-05-26 MED ORDER — IOHEXOL 300 MG/ML  SOLN
50.0000 mL | Freq: Once | INTRAMUSCULAR | Status: AC | PRN
  Administered 2014-05-26: 50 mL via ORAL

## 2014-05-26 MED ORDER — ACETAMINOPHEN 325 MG PO TABS
650.0000 mg | ORAL_TABLET | Freq: Four times a day (QID) | ORAL | Status: DC | PRN
  Administered 2014-05-27 – 2014-05-28 (×4): 650 mg via ORAL
  Filled 2014-05-26 (×6): qty 2

## 2014-05-26 NOTE — H&P (Signed)
Patient Demographics  [name] [name], is a 59 y.o. female  MRN: [id number]   DOB - 07/05/1955  Admit Date - 05/26/2014  Outpatient Primary MD for the patient is MASOUD,JAVED, MD    Chief Complaint  Patient presents with  . Abdominal Pain     HPI  [name] [name]  is a 59 y.o. female with past medical history significant for bowel obstruction status post recent surgery at [facility], COPD, and tobacco dependence who presents with the above complaint. Patient states over the past 2 days she's had intense abdominal pain which is cramping in nature associated with diarrhea nausea and vomiting. She's also noticed dark-colored stools. In the ER her guaiac test was positive. Patient actually wanted be transferred to [facility]. There are no beds the [facility] per Dr. Rip Harbour the ER physician. She will be admitted to the hospitalist service. Of interest, patient had surgery [facility] for ischemic bowel and she is currently on TPN and she also has a G-tube placed which she says they are not using. She says that she is supposed to have TPN for the rest of her life.    Review of Systems     Constitutional: Negative for fever, chills and malaise/fatigue.  HENT: Negative for ear pain, nosebleeds and sore throat.   Eyes: Negative for blurred vision, double vision and pain.  Respiratory: Negative for cough, hemoptysis, shortness of breath and wheezing.   Cardiovascular: Negative for chest pain, palpitations, orthopnea and PND.  Gastrointestinal: Positive nausea, vomiting, cramp like abdominal pain, diarrhea, no constipation and positive blood in stool.  Genitourinary: Negative for dysuria, urgency and hematuria.  Musculoskeletal: Negative for myalgias and back pain.  Skin: Negative for rash.  Neurological: Negative for dizziness, tremors, speech change, focal  weakness, seizures and headaches.  Endo/Heme/Allergies: Negative for polydipsia. Does not bruise/bleed easily.  Psychiatric/Behavioral: Negative for depression and hallucinations. The patient is not nervous/anxious.    Past medical History Past Medical History  Diagnosis Date  . COPD (chronic obstructive pulmonary disease)   . Bowel obstruction     x1 month ago      Past Surgical History No past surgical history on file.  Social History History  Substance Use Topics  . Smoking status: Current Every Day Smoker -- 1.00 packs/day    Types: Cigarettes  . Smokeless tobacco: Not on file  . Alcohol Use: No      Family History No family history on file.   Prior to Admission medications   Medication Sig Start Date End Date Taking? Authorizing Provider  acetaminophen (TYLENOL) 160 MG/5ML solution Take 649.6 mg by mouth every 6 (six) hours as needed for mild pain.   Yes Historical Provider, MD  albuterol (ACCUNEB) 1.25 MG/3ML nebulizer solution Take 1 ampule by nebulization 3 (three) times daily as needed for wheezing.   Yes Historical Provider, MD  antiseptic oral rinse (BIOTENE) LIQD Take 5 mLs by mouth as needed for dry mouth.  Yes Historical Provider, MD  budesonide-formoterol (SYMBICORT) 160-4.5 MCG/ACT inhaler Inhale 2 puffs into the lungs 2 (two) times daily.   Yes Historical Provider, MD  clopidogrel (PLAVIX) 75 MG tablet Take 75 mg by mouth daily.   Yes Historical Provider, MD  cyanocobalamin 1000 MCG tablet Take 1,000 mcg by mouth daily.   Yes Historical Provider, MD  cycloSPORINE (RESTASIS) 0.05 % ophthalmic emulsion Place 1 drop into both eyes every 12 (twelve) hours.   Yes Historical Provider, MD  dextrose 10 % infusion Inject 70 mL/hr into the vein continuous.   Yes Historical Provider, MD  dicyclomine (BENTYL) 10 MG capsule Take 10 mg by mouth 4 (four) times daily as needed for spasms.   Yes Historical Provider, MD  ergocalciferol (DRISDOL) 8000 UNIT/ML drops Take 50,400  Units by mouth every 7 (seven) days.   Yes Historical Provider, MD  fluticasone (FLONASE) 50 MCG/ACT nasal spray Place 2 sprays into both nostrils daily as needed for rhinitis.   Yes Historical Provider, MD  HEPARIN LOCK FLUSH IV Inject 5 mLs into the vein daily.   Yes Historical Provider, MD  loperamide (IMODIUM) 2 MG capsule Take 2 mg by mouth 4 (four) times daily as needed for diarrhea or loose stools.   Yes Historical Provider, MD  omeprazole (PRILOSEC) 20 MG capsule Take 20 mg by mouth every morning.   Yes Historical Provider, MD  ondansetron (ZOFRAN) 8 MG tablet Take 8 mg by mouth every 6 (six) hours as needed for nausea or vomiting.   Yes Historical Provider, MD  pregabalin (LYRICA) 75 MG capsule Take 75 mg by mouth 2 (two) times daily.   Yes Historical Provider, MD  Sodium Chloride Flush (NORMAL SALINE FLUSH IV) Inject 10 mLs into the vein every 12 (twelve) hours.   Yes Historical Provider, MD  trolamine salicylate (ASPERCREME) 10 % cream Apply 1 application topically every 8 (eight) hours as needed (for lower back pain).   Yes Historical Provider, MD    Allergies  Allergen Reactions  . Aspirin Itching    Physical Exam  Vitals  Blood pressure 138/67, pulse 90, temperature 98.6 F (37 C), temperature source Oral, resp. rate 22, height '5\' 6"'$  (1.676 m), weight 39.009 kg (86 lb), SpO2 97 %.    Constitutional:  Thin and frail HENT:  Head: Normocephalic and atraumatic.  Mouth/Throat: Oropharynx is clear and dry  Eyes: Pupils are equal, round, and reactive to light.  Neck: Normal range of motion. Neck supple. No JVD present. No tracheal deviation present. No thyromegaly present.  Cardiovascular: Normal rate, regular rhythm and normal heart sounds.  Exam reveals no gallop.   No murmur heard. Respiratory: Effort normal and breath sounds normal. No respiratory distress. No wheezing, crackles, rales. GI: Bowel sounds are positive she has diffuse tenderness there is no rebound or  guarding  Musculoskeletal:No edema or tenderness. She has a G-tube placed her G-tube does not look to be infected.  Neurological: Alert and oriented to person, place, and time. No cranial nerve deficit. Coordination normal.  Skin: Skin is warm and dry. No rash noted.  she has a central line for TPN.  Psychiatric:  Normal mood and affect.     Data Review  CBC  Recent Labs Lab 05/26/14 1239  WBC 11.8*  HGB 9.2*  HCT 28.0*  PLT 395  MCV 95.8  MCH 31.5  MCHC 32.9  RDW 14.8*  LYMPHSABS 4.4*  MONOABS 0.8  EOSABS 0.1  BASOSABS 0.1   ------------------------------------------------------------------------------------------------------------------  Chemistries  Recent Labs Lab 05/26/14 1239  NA 137  K 3.8  CL 104  CO2 27  GLUCOSE 97  BUN 16  CREATININE <0.30*  CALCIUM 7.9*  AST 24  ALT 33  ALKPHOS 78  BILITOT 0.3   ------------------------------------------------------------------------------------------------------------------ ------------------------------------------------------------------------------------------------------------------ --------------------------------------------------------------------  Imaging results:  CT of the abdomen was performed which shows no acute changes.    EKG: Sinus rhythm with PACs no ST elevation or depression Assessment & Plan  1. Abdominal pain: Patient had recent bowel surgery for ischemic bowel. Her CT of the abdomen was not acute. Her abdominal pain may be related to gastroenteritis associated with her nausea,vomiting, diarrhea which she complains about for the past 3 days. Patient will be placed under observation status. We'll continue supportive care with IV fluids and pain medications. We will continue with serial abdominal examinations.  2. Nutrition: Patient currently has a G-tube placed as well as central line for TPN. I counseled the dietary to help with TPN supplementation. She is also taking a diet by mouth. I  have written for a regular diet as per her request.   3. COPD: Seems to be stable at this time.  4. Tobacco dependence patient encouraged to stop smoking she smokes about 1 cigarette every 2 days. She does not want a nicotine patch she does not want to quit smoking. Patient was counseled for 3 minutes.  DVT Prophylaxis SCDs and TED hose   Family Communication: Admission, patients condition and plan of care including tests being ordered have been discussed with the patient and family who indicate understanding and agree with the plan and Code Status.  If patient remains in the hospital more than 1 day patient's family would like to continue with transfer to [facility].  Code Status full Time spent in minutes : 104  Nicoletta Hush, MD

## 2014-05-26 NOTE — ED Notes (Signed)
Pt being transported upstairs to 113 via ED Medic at this time.

## 2014-05-26 NOTE — ED Provider Notes (Signed)
CSN: [id number]     Arrival date & time 05/26/14  1102 History   First MD Initiated Contact with Patient 05/26/14 1114     Chief Complaint  Patient presents with  . Abdominal Pain     (Consider location/radiation/quality/duration/timing/severity/associated sxs/prior Treatment) HPI  Past Medical History  Diagnosis Date  . COPD (chronic obstructive pulmonary disease)   . Bowel obstruction     x1 month ago   No past surgical history on file. No family history on file. History  Substance Use Topics  . Smoking status: Current Every Day Smoker -- 1.00 packs/day    Types: Cigarettes  . Smokeless tobacco: Not on file  . Alcohol Use: No   OB History    No data available     Review of Systems    Allergies  Aspirin  Home Medications   Prior to Admission medications   Medication Sig Start Date End Date Taking? Authorizing Provider  acetaminophen (TYLENOL) 160 MG/5ML solution Take 649.6 mg by mouth every 6 (six) hours as needed for mild pain.   Yes Historical Provider, MD  albuterol (ACCUNEB) 1.25 MG/3ML nebulizer solution Take 1 ampule by nebulization 3 (three) times daily as needed for wheezing.   Yes Historical Provider, MD  antiseptic oral rinse (BIOTENE) LIQD Take 5 mLs by mouth as needed for dry mouth.    Yes Historical Provider, MD  budesonide-formoterol (SYMBICORT) 160-4.5 MCG/ACT inhaler Inhale 2 puffs into the lungs 2 (two) times daily.   Yes Historical Provider, MD  clopidogrel (PLAVIX) 75 MG tablet Take 75 mg by mouth daily.   Yes Historical Provider, MD  cyanocobalamin 1000 MCG tablet Take 1,000 mcg by mouth daily.   Yes Historical Provider, MD  cycloSPORINE (RESTASIS) 0.05 % ophthalmic emulsion Place 1 drop into both eyes every 12 (twelve) hours.   Yes Historical Provider, MD  dextrose 10 % infusion Inject 70 mL/hr into the vein continuous.   Yes Historical Provider, MD  dicyclomine (BENTYL) 10 MG capsule Take 10 mg by mouth 4 (four) times daily as needed for spasms.    Yes Historical Provider, MD  ergocalciferol (DRISDOL) 8000 UNIT/ML drops Take 50,400 Units by mouth every 7 (seven) days.   Yes Historical Provider, MD  fluticasone (FLONASE) 50 MCG/ACT nasal spray Place 2 sprays into both nostrils daily as needed for rhinitis.   Yes Historical Provider, MD  HEPARIN LOCK FLUSH IV Inject 5 mLs into the vein daily.   Yes Historical Provider, MD  loperamide (IMODIUM) 2 MG capsule Take 2 mg by mouth 4 (four) times daily as needed for diarrhea or loose stools.   Yes Historical Provider, MD  omeprazole (PRILOSEC) 20 MG capsule Take 20 mg by mouth every morning.   Yes Historical Provider, MD  ondansetron (ZOFRAN) 8 MG tablet Take 8 mg by mouth every 6 (six) hours as needed for nausea or vomiting.   Yes Historical Provider, MD  pregabalin (LYRICA) 75 MG capsule Take 75 mg by mouth 2 (two) times daily.   Yes Historical Provider, MD  Sodium Chloride Flush (NORMAL SALINE FLUSH IV) Inject 10 mLs into the vein every 12 (twelve) hours.   Yes Historical Provider, MD  trolamine salicylate (ASPERCREME) 10 % cream Apply 1 application topically every 8 (eight) hours as needed (for lower back pain).   Yes Historical Provider, MD   BP 138/67 mmHg  Pulse 90  Temp(Src) 98.6 F (37 C) (Oral)  Resp 22  Ht '5\' 6"'$  (1.676 m)  Wt 86 lb (39.009 kg)  BMI 13.89 kg/m2  SpO2 97%  LMP  Physical Exam  ED Course  Procedures (including critical care time) Labs Review Labs Reviewed  COMPREHENSIVE METABOLIC PANEL - Abnormal; Notable for the following:    Creatinine, Ser <0.30 (*)    Calcium 7.9 (*)    Total Protein 5.8 (*)    Albumin 2.6 (*)    All other components within normal limits  CBC WITH DIFFERENTIAL/PLATELET - Abnormal; Notable for the following:    WBC 11.8 (*)    RBC 2.93 (*)    Hemoglobin 9.2 (*)    HCT 28.0 (*)    RDW 14.8 (*)    Lymphs Abs 4.4 (*)    All other components within normal limits  LIPASE, BLOOD    Imaging Review Ct Abdomen Pelvis W  Contrast  05/26/2014   CLINICAL DATA:  Acute onset abdominal pain 05/25/2014. Patient status post gastrostomy tube placement 1 month ago now with sharp pain about the tube. History of bowel obstruction.  EXAM: CT ABDOMEN AND PELVIS WITH CONTRAST  TECHNIQUE: Multidetector CT imaging of the abdomen and pelvis was performed using the standard protocol following bolus administration of intravenous contrast.  CONTRAST:  80 mL OMNIPAQUE IOHEXOL 300 MG/ML  SOLN  COMPARISON:  CT abdomen and pelvis 03/17/2014 and 09/29/2013.  FINDINGS: The lung bases appear emphysematous but are clear. There is no pleural or pericardial effusion.  Gastrostomy tube is in place with the tip in the stomach. No fluid collection about the tube is identified. There is some thickening of the walls of the stomach just distal to the tube on early phase imaging which appears resolve on delayed imaging. The small and large bowel are unremarkable. The appendix is normal in appearance. Mild mesenteric edema is identified. There is no focal fluid collection.  The gallbladder is completely decompressed but otherwise unremarkable. Minimal intrahepatic biliary ductal prominence is unchanged. The pancreas, adrenal glands, spleen and kidneys appear normal. Linear areas of high density just anterior to the lower pole of the left kidney and centrally in the pelvis may be postoperative in nature and are not seen on the prior examination. No lymphadenopathy is identified. Mild subcutaneous edema is seen throughout. The patient is status post tubal ligation. Fundal fibroid is identified as on the prior exam.  No focal bony abnormality is identified.  IMPRESSION: Gastrostomy tube is in good position without evidence of complicating feature.  Thickening of the walls of stomach just distal to the tip of the tube nearly completely resolves on delayed imaging but could reflect gastritis.  Mesenteric and body wall edema compatible with anasarca.   Electronically Signed    By: Inge Rise M.D.   On: 05/26/2014 14:17   Dg Chest Portable 1 View  05/26/2014   CLINICAL DATA:  59 year old female central line placement. Initial encounter.  EXAM: PORTABLE CHEST - 1 VIEW  COMPARISON:  09/29/2013 and earlier.  FINDINGS: Portable AP upright view at 1154 hrs. Double lumen tunneled type right chest catheter is in place. Catheter tip projects at the lower SVC level about 1 vertebral body below the carina. No pneumothorax. Normal cardiac size and mediastinal contours. Allowing for portable technique, the lungs are clear. Partially visible tubing projecting in the epigastrium.  IMPRESSION: Right chest tunneled type central line in place, tip at the SVC level.  No acute cardiopulmonary abnormality.   Electronically Signed   By: Genevie Ann M.D.   On: 05/26/2014 12:05     EKG Interpretation None  MDM   Final diagnoses:  Abdominal pain, unspecified abdominal location  Gastrointestinal hemorrhage with melena  Anemia, unspecified anemia type        Nena Polio, MD 05/26/14 1729

## 2014-05-26 NOTE — ED Notes (Addendum)
Pt's caretaker verbalized desire to be transferred to [facility] for further treatments, MD malinda made aware, MD malinda verbalized understanding and has contacted [facility]. Pt and caretaker made aware, verbalized understanding. Pt and caretaker verbalized no current needs at this time. Pt ambulated to bathroom with no concerns. Vitals wnl, no acute distress noted.

## 2014-05-26 NOTE — ED Notes (Signed)
Pt to ED from [place] [facility] with sudden onset of abd pain that began yesterday. Pt has Gtube placed x 1 month ago due to bowel obstruction, and pt c/c of sharp shooting pain 10/10 around the gtube. Pt states presence of nausea but denies vomiting or diarrhea. Pt also has central line placed right clavicular ide used for D10 infusions when Gtube feedings are not being infused. Vitals wnl on arrival, no acute distress noted.

## 2014-05-26 NOTE — ED Provider Notes (Signed)
[facility] Emergency Department Provider Note    ____________________________________________  Time seen: ----------------------------------------- 11:27 AM on 05/26/2014 -----------------------------------------    I have reviewed the triage vital signs and the nursing notes.   HISTORY  Chief Complaint Abdominal Pain       HPI [name] [name] is a 59 y.o. female who complains of abdominal pain starting night the pain is in the middle and lower abdomen and then she added that it also is in the upper abdomen pain appears to have been constant since onset and is moderate in nature pain is crampy feels like the pain she had with her previous bowel obstruction think seems to make it better or worse she denies any nausea vomiting diarrhea she said she did have a temperature of 99 earlier today patient did add that she had breakfast this morning without any difficulty but that the food seemed to get stuck just below her ribs and has been hurting there ever since    Past Medical History  Diagnosis Date  . COPD (chronic obstructive pulmonary disease)   . Bowel obstruction     x1 month ago    There are no active problems to display for this patient.   No past surgical history on file.  No current outpatient prescriptions on file.  Allergies Aspirin  No family history on file.  Social History History  Substance Use Topics  . Smoking status: Current Every Day Smoker -- 1.00 packs/day    Types: Cigarettes  . Smokeless tobacco: Not on file  . Alcohol Use: No    Review of Systems          Review of systems is negative except for as mentioned in the history present illness  10-point ROS otherwise negative.  ____________________________________________   PHYSICAL EXAM:  VITAL SIGNS: ED Triage Vitals  Enc Vitals Group     BP 05/26/14 1110 115/75 mmHg     Pulse Rate 05/26/14 1110 100     Resp --      Temp 05/26/14 1125 98.6 F (37 C)      Temp Source 05/26/14 1125 Oral     SpO2 05/26/14 1110 99 %     Weight 05/26/14 1110 86 lb (39.009 kg)     Height 05/26/14 1110 '5\' 6"'$  (1.676 m)     Head Cir --      Peak Flow --      Pain Score 05/26/14 1111 10     Pain Loc --      Pain Edu? --      Excl. in [place]? --      Constitutional: Alert and oriented. Well appearing and in no distress. Eyes: Conjunctivae are normal. PERRL. Normal extraocular movements. ENT   Head: Normocephalic and atraumatic.   Nose: No congestion/rhinnorhea.   Mouth/Throat: Mucous membranes are moist.   Neck: No stridor. Hematological/Lymphatic/Immunilogical: No cervical lymphadenopathy. Cardiovascular: Normal rate, regular rhythm. Normal and symmetric distal pulses are present in all extremities. No murmurs, rubs, or gallops. Respiratory: Normal respiratory effort without tachypnea nor retractions. Breath sounds are clear and equal bilaterally. No wheezes/rales/rhonchi. Gastrointestinal: Soft abdomen is tender to palpation and percussion in the right lower quadrant right upper quadrant and immediately in the epigastric area there is no tenderness on the left side of the abdomen there is a large midline surgical scar there is a G-tube placed which looks good additionally there is a central line in the right chest site looks good as well Genitourinary: Genitourinary  exam is deferred Musculoskeletal: Nontender with normal range of motion in all extremities. No joint effusions.  No lower extremity tenderness nor edema. Neurologic:  Normal speech and language. No gross focal neurologic deficits are appreciated. Speech is normal. No gait instability. Skin:  Skin is warm, dry and intact. No rash noted. Psychiatric: Mood and affect are normal. Speech and behavior are normal. Patient exhibits appropriate insight and judgment. Rectal exam: Nontender stool present Hemoccult  positive  ____________________________________________   EKG   ____________________________________________    RADIOLOGY  CT abdomen shows no acute disease per the radiology report  ____________________________________________   PROCEDURES  Procedure(s) performed: None  Critical Care performed: No  ____________________________________________   INITIAL IMPRESSION / ASSESSMENT AND PLAN / ED COURSE  Pertinent labs & imaging results that were available during my care of the patient were reviewed by me and considered in my medical decision making (see chart for details).  Labs reviewed showed that her H&H has dropped from 13.9 and 9 in the last couple months, fine with her abdominal pain history of gastritis and peptic ulcer disease and Hemoccult positive stool these need to want to watch her in the hospital at least overnight patient requests to go to [facility]  ____________________________________________   FINAL CLINICAL IMPRESSION(S) / ED DIAGNOSES  Final diagnoses:  Abdominal pain, unspecified abdominal location  Gastrointestinal hemorrhage with melena  Anemia, unspecified anemia type     Nena Polio, MD 05/26/14 1731

## 2014-05-26 NOTE — ED Notes (Signed)
RN waiting for chest xray to confirm placement of central line due to pt's request to use central line for treatment during this visit.

## 2014-05-27 DIAGNOSIS — E43 Unspecified severe protein-calorie malnutrition: Secondary | ICD-10-CM | POA: Diagnosis present

## 2014-05-27 LAB — BASIC METABOLIC PANEL
Anion gap: 5 (ref 5–15)
BUN: 8 mg/dL (ref 6–20)
CO2: 26 mmol/L (ref 22–32)
Calcium: 8 mg/dL — ABNORMAL LOW (ref 8.9–10.3)
Chloride: 107 mmol/L (ref 101–111)
Creatinine, Ser: <0.3 mg/dL — ABNORMAL LOW (ref 0.44–1.00)
Glucose, Bld: 90 mg/dL (ref 65–99)
Potassium: 3.6 mmol/L (ref 3.5–5.1)
Sodium: 138 mmol/L (ref 135–145)

## 2014-05-27 LAB — C DIFFICILE QUICK SCREEN W PCR REFLEX
C Diff antigen: NEGATIVE
C Diff interpretation: NEGATIVE
C Diff toxin: NEGATIVE

## 2014-05-27 MED ORDER — ENSURE ENLIVE PO LIQD
237.0000 mL | Freq: Two times a day (BID) | ORAL | Status: DC
  Administered 2014-05-27 – 2014-05-28 (×3): 237 mL via ORAL

## 2014-05-27 MED ORDER — FREE WATER
60.0000 mL | Freq: Three times a day (TID) | Status: DC
  Administered 2014-05-28 – 2014-05-31 (×8): 60 mL

## 2014-05-27 NOTE — Progress Notes (Addendum)
Initial Nutrition Assessment  DOCUMENTATION CODES:  Severe malnutrition in context of chronic illness  INTERVENTION: Medical Nutrition Supplement: Ensure Enlive (each supplement provides 350kcal and 20 grams of protein)  Coordination of care: Spoke with Dr. Marthann Schiller and not planning on restarting TPN at this time but pt to resume TPN when discharged back to facility.  If pt to be inpatient for sometime will reconsult RD per MD  NUTRITION DIAGNOSIS:  Impaired nutrient utilization related to altered GI function as evidenced by moderate depletions of muscle mass, severe depletion of muscle mass.    GOAL:  Patient will meet greater than or equal to 90% of their needs    MONITOR:  Energy intake:  Electrolyte and renal profile Glucose profile Digestive system  REASON FOR ASSESSMENT:  Consult New TPN/TNA  ASSESSMENT:  Pt admitted with abdominal pain, nausea.  Recent admission at [facility] for ischemic bowel, s/p surgery at the end of March per pt.  Pt reports that because of amount of intestines removed MD said that she would need to be on TPN for life.  Also has PEG tube that is not in use per pt and will be removed soon.  PMHx: HTN, COPD, bowel obstruction, recent surgery at [facility], DM, CHF, cancer, mutiple myeloma, renal insufficency  Pt reports ate 100% of oatmeal , bacon, toast with jelly and butter this am for breakfast. Reports intake at facility has been poor for the past 3 weeks secondary to does not like the food and having frequent diarrhea when eating the food.  Labs: reviewed Medications: NS at 135m/hr  Height:  Ht Readings from Last 1 Encounters:  05/26/14 '5\' 6"'$  (1.676 m)    Weight: Pt reports wt has been up and down, unable to quantify information.  Wt Readings from Last 1 Encounters:  05/26/14 86 lb (39.009 kg)       Wt Readings from Last 10 Encounters:  05/26/14 86 lb (39.009 kg)    BMI:  Body mass index is 13.89 kg/(m^2).  Estimated Nutritional  Needs:  Kcal:  1593-1857 kcals BEE 1191 kcals (IF 1.0-1.2, AF 1.3) Using IBW of 59kg  Protein:  71-89 gm/d (1.2-1.5 gm/kg) Using IBW of 59kg  Fluid:  1475-17718md (25-3078mg) Using IBW of 59kg  Skin:  Reviewed, no issues  Diet Order:  Diet regular Room service appropriate?: Yes; Fluid consistency:: Thin  EDUCATION NEEDS:  No education needs identified at this time   Intake/Output Summary (Last 24 hours) at 05/27/14 1137 Last data filed at 05/27/14 0936  Gross per 24 hour  Intake    120 ml  Output    350 ml  Net   -230 ml    Last BM:  5/3 noted  High level of care  Bartlomiej Jenkinson B. AllZenia ResidesD,SmootDNChesterfieldager)

## 2014-05-27 NOTE — Progress Notes (Signed)
[facility] [place] NAME: [name] [name]    MR#:  [id number]  DATE OF BIRTH:  Sep 06, 1955  SUBJECTIVE:  CHIEF COMPLAINT:   Chief Complaint  Patient presents with  . Abdominal Pain   very less pain today, tolerated diet.  REVIEW OF SYSTEMS:  CONSTITUTIONAL: No fever, fatigue or weakness.  EYES: No blurred or double vision.  EARS, NOSE, AND THROAT: No tinnitus or ear pain.  RESPIRATORY: No cough, shortness of breath, wheezing or hemoptysis.  CARDIOVASCULAR: No chest pain, orthopnea, edema.  GASTROINTESTINAL: No nausea, vomiting, diarrhea or abdominal pain.  GENITOURINARY: No dysuria, hematuria.  ENDOCRINE: No polyuria, nocturia,  HEMATOLOGY: No anemia, easy bruising or bleeding SKIN: No rash or lesion. MUSCULOSKELETAL: No joint pain or arthritis.   NEUROLOGIC: No tingling, numbness, weakness.  PSYCHIATRY: No anxiety or depression.   DRUG ALLERGIES:   Allergies  Allergen Reactions  . Aspirin Itching    VITALS:  Blood pressure 162/63, pulse 95, temperature 99 F (37.2 C), temperature source Oral, resp. rate 18, height '5\' 6"'$  (1.676 m), weight 86 lb (39.009 kg), SpO2 100 %.  PHYSICAL EXAMINATION:  GENERAL:  59 y.o.-year-old patient lying in the bed with no acute distress. cachectic EYES: Pupils equal, round, reactive to light and accommodation. No scleral icterus. Extraocular muscles intact.  HEENT: Head atraumatic, normocephalic. Oropharynx and nasopharynx clear.  NECK:  Supple, no jugular venous distention. No thyroid enlargement, no tenderness.  LUNGS: Normal breath sounds bilaterally, no wheezing, rales,rhonchi or crepitation. No use of accessory muscles of respiration.  CARDIOVASCULAR: S1, S2 normal. No murmurs, rubs, or gallops.  ABDOMEN: Soft, nontender, nondistended. Bowel sounds present. No organomegaly or mass.     PEG tube in place. EXTREMITIES: No pedal edema, cyanosis, or clubbing.  NEUROLOGIC: Cranial nerves II  through XII are intact. Muscle strength 5/5 in all extremities. Sensation intact. Gait not checked.  PSYCHIATRIC: The patient is alert and oriented x 3.  SKIN: No obvious rash, lesion, or ulcer.    LABORATORY PANEL:   CBC  Recent Labs Lab 05/26/14 1239  WBC 11.8*  HGB 9.2*  HCT 28.0*  PLT 395   ------------------------------------------------------------------------------------------------------------------  Chemistries   Recent Labs Lab 05/26/14 1239 05/27/14 0458  NA 137 138  K 3.8 3.6  CL 104 107  CO2 27 26  GLUCOSE 97 90  BUN 16 8  CREATININE <0.30* <0.30*  CALCIUM 7.9* 8.0*  AST 24  --   ALT 33  --   ALKPHOS 78  --   BILITOT 0.3  --    ------------------------------------------------------------------------------------------------------------------  Cardiac Enzymes No results for input(s): TROPONINI in the last 168 hours. ------------------------------------------------------------------------------------------------------------------  RADIOLOGY:  Ct Abdomen Pelvis W Contrast  05/26/2014   CLINICAL DATA:  Acute onset abdominal pain 05/25/2014. Patient status post gastrostomy tube placement 1 month ago now with sharp pain about the tube. History of bowel obstruction.  EXAM: CT ABDOMEN AND PELVIS WITH CONTRAST  TECHNIQUE: Multidetector CT imaging of the abdomen and pelvis was performed using the standard protocol following bolus administration of intravenous contrast.  CONTRAST:  80 mL OMNIPAQUE IOHEXOL 300 MG/ML  SOLN  COMPARISON:  CT abdomen and pelvis 03/17/2014 and 09/29/2013.  FINDINGS: The lung bases appear emphysematous but are clear. There is no pleural or pericardial effusion.  Gastrostomy tube is in place with the tip in the stomach. No fluid collection about the tube is identified. There is some thickening of the walls of the stomach just distal to the  tube on early phase imaging which appears resolve on delayed imaging. The small and large bowel are  unremarkable. The appendix is normal in appearance. Mild mesenteric edema is identified. There is no focal fluid collection.  The gallbladder is completely decompressed but otherwise unremarkable. Minimal intrahepatic biliary ductal prominence is unchanged. The pancreas, adrenal glands, spleen and kidneys appear normal. Linear areas of high density just anterior to the lower pole of the left kidney and centrally in the pelvis may be postoperative in nature and are not seen on the prior examination. No lymphadenopathy is identified. Mild subcutaneous edema is seen throughout. The patient is status post tubal ligation. Fundal fibroid is identified as on the prior exam.  No focal bony abnormality is identified.  IMPRESSION: Gastrostomy tube is in good position without evidence of complicating feature.  Thickening of the walls of stomach just distal to the tip of the tube nearly completely resolves on delayed imaging but could reflect gastritis.  Mesenteric and body wall edema compatible with anasarca.   Electronically Signed   By: Inge Rise M.D.   On: 05/26/2014 14:17   Dg Chest Portable 1 View  05/26/2014   CLINICAL DATA:  58 year old female central line placement. Initial encounter.  EXAM: PORTABLE CHEST - 1 VIEW  COMPARISON:  09/29/2013 and earlier.  FINDINGS: Portable AP upright view at 1154 hrs. Double lumen tunneled type right chest catheter is in place. Catheter tip projects at the lower SVC level about 1 vertebral body below the carina. No pneumothorax. Normal cardiac size and mediastinal contours. Allowing for portable technique, the lungs are clear. Partially visible tubing projecting in the epigastrium.  IMPRESSION: Right chest tunneled type central line in place, tip at the SVC level.  No acute cardiopulmonary abnormality.   Electronically Signed   By: Genevie Ann M.D.   On: 05/26/2014 12:05    EKG:   Orders placed or performed during the hospital encounter of 05/26/14  . ED EKG  . ED EKG     ASSESSMENT AND PLAN:   *  Abdominal pain       Likely Gastritis- resolved now, eating regular food, CT no acute findings.        *  Protein-calorie malnutrition, severe       As per pt- she was on TPN daily- on top of her regular diet- to help with nutrition.        Will get Dietary consult to help with decide TPN as out pt- as pt request to go home    * COPD: Seems to be stable at this time. Symbicort.  * Tobacco dependence    councelled to quit for 4 min.        * generalized weakness     Was in rehab before admission, want to go home.     Spoke to [place]- and got PT eval to decide.  All the records are reviewed and case discussed with Care Management/Social Workerr. Management plans discussed with the patient, family and they are in agreement.  CODE STATUS: full code  TOTAL TIME TAKING CARE OF THIS PATIENT: 35 minutes.   POSSIBLE D/C IN 1-2 DAYS, DEPENDING ON CLINICAL CONDITION.   Vaughan Basta M.D on 05/27/2014 at 9:50 PM  Between 7am to 6pm - Pager - [phone]  After 6pm go to [email or web address] - password [email or web address] [facility]  Office  [phone]  CC: Primary care physician; Cletis Athens, MD

## 2014-05-27 NOTE — Clinical Social Work Note (Signed)
Clinical Social Work Assessment  Patient Details  Name: [name] [name] MRN: [id number] Date of Birth: 1955/10/25  Date of referral:  05/27/14               Reason for consult:  Facility Placement                Permission sought to share information with:  Facility Sport and exercise psychologist, Family Supports Permission granted to share information::  Yes, Verbal Permission Granted  Name::      (RNCM)  Agency::     Relationship::     Contact Information:     Housing/Transportation Living arrangements for the past 2 months:  [place] of Information:  Patient Patient Interpreter Needed:  None Criminal Activity/Legal Involvement Pertinent to Current Situation/Hospitalization:  No - Comment as needed Significant Relationships:  Adult Children, Siblings, Other(Comment) Lives with:  Facility Resident Do you feel safe going back to the place where you live?  Yes Need for family participation in patient care:  No (Coment)  Care giving concerns:  Patient admitted from [facility] SNF rehab- she reports being there for about 2 weeks.   Social Worker assessment / plan:  CSW met with patient who is eating a sub from [employer]- "I'm starving". She reports that she has been at SNF level for about 2 weeks- prior to that, she was at [facility] discussed plans for return at dc- she is declining plans to return, stating she has family who will assist her at home- she reports [facility]   Employment status:  Retired Forensic scientist:  Medicare PT Recommendations:  Not assessed at this time Information / Referral to community resources:     Patient/Family's Response to care:  Patient quite adamant about returning home   Patient/Family's Understanding of and Emotional Response to Diagnosis, Current Treatment, and Prognosis:  Patient feels she is doing well enough to return to her home and not to return to SNF at dc. Recommend PT eval prior to dc for assessment of home needs  including HH and DME.  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  CSW has advised RNCM of this and she will proceed for HH/DME orders.  Emotional Assessment Appearance:  Appears stated age Attitude/Demeanor/Rapport:    Affect (typically observed):  Accepting, Appropriate Orientation:  Oriented to Self, Oriented to Place, Oriented to  Time, Oriented to Situation Alcohol / Substance use:  Other Psych involvement (Current and /or in the community):  No (Comment)  Discharge Needs  Concerns to be addressed:    Readmission within the last 30 days:    Current discharge risk:  Physical Impairment Barriers to Discharge:   Eduard Clos, LCSWA DP)   Ludwig Clarks, LCSW 05/27/2014, 1:48 PM

## 2014-05-27 NOTE — Clinical Social Work Note (Signed)
Clinical Social Work Assessment  Patient Details  Name: [name] [name] MRN: [id number] Date of Birth: 20-Oct-1955  Date of referral:  05/27/14               Reason for consult:  Facility Placement                Permission sought to share information with:  Facility Sport and exercise psychologist, Family Supports Permission granted to share information::  Yes, Verbal Permission Granted  Name::      (RNCM)  Agency::     Relationship::     Contact Information:     Housing/Transportation Living arrangements for the past 2 months:  [place] of Information:  Patient Patient Interpreter Needed:  None Criminal Activity/Legal Involvement Pertinent to Current Situation/Hospitalization:  No - Comment as needed Significant Relationships:  Adult Children, Siblings, Other(Comment) Lives with:  Facility Resident Do you feel safe going back to the place where you live?  Yes Need for family participation in patient care:  No (Coment)  Care giving concerns: none   Facilities manager / plan:   CSW has advised RNCM of patient's wishes to go home at dc instead of return to SNF- PT eval is recommended for home needs.   Employment status:  Retired Forensic scientist:  Medicare PT Recommendations:  Not assessed at this time Information / Referral to community resources:     Patient/Family's Response to care:  Pleased with care and is anxious to go home.  Patient/Family's Understanding of and Emotional Response to Diagnosis, Current Treatment, and Prognosis:   Patient is pleasant and appreciative of CSW visit- she does want to go home at dc and reports her family can assist with her care/needs.  Emotional Assessment Appearance:  Appears stated age Attitude/Demeanor/Rapport:    Affect (typically observed):  Accepting, Appropriate Orientation:  Oriented to Self, Oriented to Place, Oriented to  Time, Oriented to Situation Alcohol / Substance use:  Other Psych involvement  (Current and /or in the community):  No (Comment)  Discharge Needs  Concerns to be addressed:   home needs  Readmission within the last 30 days:   NO Current discharge risk:  Physical Impairment Barriers to Discharge:   Eduard Clos, LCSWA DP)   Ludwig Clarks, LCSW 05/27/2014, 2:02 PM

## 2014-05-27 NOTE — Plan of Care (Addendum)
Problem: Discharge Progression Outcomes Goal: Discharge plan in place and appropriate Individualization of Care 1. Like to be called [name] [name] 2. Currently living at [facility] for rehab 3. In a substance abuse program 4. Hx of bowel obstruction and COPD, controlled by home medications Goal: Other Discharge Outcomes/Goals Outcome: Progressing Plan of Care progress to goal for:  Pain-pt given prn meds for pain with improvement Hemodynamically-NS '@100ml'$ /hr Complications-pt had no c/o this shift Diet-pt tolerated food brought in by support person Activity-pt up to bathroom with assistance

## 2014-05-28 DIAGNOSIS — K912 Postsurgical malabsorption, not elsewhere classified: Secondary | ICD-10-CM | POA: Diagnosis present

## 2014-05-28 DIAGNOSIS — Z931 Gastrostomy status: Secondary | ICD-10-CM | POA: Diagnosis not present

## 2014-05-28 DIAGNOSIS — F1410 Cocaine abuse, uncomplicated: Secondary | ICD-10-CM | POA: Diagnosis present

## 2014-05-28 DIAGNOSIS — K5900 Constipation, unspecified: Secondary | ICD-10-CM | POA: Diagnosis present

## 2014-05-28 DIAGNOSIS — J449 Chronic obstructive pulmonary disease, unspecified: Secondary | ICD-10-CM | POA: Diagnosis present

## 2014-05-28 DIAGNOSIS — N390 Urinary tract infection, site not specified: Secondary | ICD-10-CM | POA: Diagnosis not present

## 2014-05-28 DIAGNOSIS — E876 Hypokalemia: Secondary | ICD-10-CM | POA: Diagnosis present

## 2014-05-28 DIAGNOSIS — K649 Unspecified hemorrhoids: Secondary | ICD-10-CM | POA: Diagnosis present

## 2014-05-28 DIAGNOSIS — Z681 Body mass index (BMI) 19 or less, adult: Secondary | ICD-10-CM | POA: Diagnosis not present

## 2014-05-28 DIAGNOSIS — Z888 Allergy status to other drugs, medicaments and biological substances status: Secondary | ICD-10-CM | POA: Diagnosis not present

## 2014-05-28 DIAGNOSIS — R109 Unspecified abdominal pain: Secondary | ICD-10-CM | POA: Diagnosis present

## 2014-05-28 DIAGNOSIS — E43 Unspecified severe protein-calorie malnutrition: Secondary | ICD-10-CM | POA: Diagnosis present

## 2014-05-28 DIAGNOSIS — K921 Melena: Secondary | ICD-10-CM | POA: Diagnosis present

## 2014-05-28 DIAGNOSIS — Z79899 Other long term (current) drug therapy: Secondary | ICD-10-CM | POA: Diagnosis not present

## 2014-05-28 DIAGNOSIS — K551 Chronic vascular disorders of intestine: Secondary | ICD-10-CM | POA: Diagnosis present

## 2014-05-28 DIAGNOSIS — F17210 Nicotine dependence, cigarettes, uncomplicated: Secondary | ICD-10-CM | POA: Diagnosis present

## 2014-05-28 DIAGNOSIS — D649 Anemia, unspecified: Secondary | ICD-10-CM | POA: Diagnosis present

## 2014-05-28 DIAGNOSIS — R64 Cachexia: Secondary | ICD-10-CM | POA: Diagnosis present

## 2014-05-28 DIAGNOSIS — K2900 Acute gastritis without bleeding: Secondary | ICD-10-CM | POA: Diagnosis present

## 2014-05-28 LAB — CBC
HCT: 28.5 % — ABNORMAL LOW (ref 35.0–47.0)
Hemoglobin: 9.4 g/dL — ABNORMAL LOW (ref 12.0–16.0)
MCH: 31.4 pg (ref 26.0–34.0)
MCHC: 33.1 g/dL (ref 32.0–36.0)
MCV: 94.9 fL (ref 80.0–100.0)
Platelets: 376 10*3/uL (ref 150–440)
RBC: 3.01 MIL/uL — ABNORMAL LOW (ref 3.80–5.20)
RDW: 14.8 % — ABNORMAL HIGH (ref 11.5–14.5)
WBC: 15.2 10*3/uL — ABNORMAL HIGH (ref 3.6–11.0)

## 2014-05-28 LAB — URINALYSIS COMPLETE WITH MICROSCOPIC (ARMC ONLY)
Bacteria, UA: NONE SEEN
Bilirubin Urine: NEGATIVE
Glucose, UA: NEGATIVE mg/dL
Hgb urine dipstick: NEGATIVE
Nitrite: NEGATIVE
Protein, ur: NEGATIVE mg/dL
Specific Gravity, Urine: 1.014 (ref 1.005–1.030)
pH: 6 (ref 5.0–8.0)

## 2014-05-28 MED ORDER — SODIUM CHLORIDE 0.9 % IJ SOLN
10.0000 mL | Freq: Two times a day (BID) | INTRAMUSCULAR | Status: DC
  Administered 2014-05-29 – 2014-06-01 (×3): 20 mL
  Administered 2014-06-01 – 2014-06-02 (×2): 10 mL

## 2014-05-28 MED ORDER — SENNOSIDES-DOCUSATE SODIUM 8.6-50 MG PO TABS
1.0000 | ORAL_TABLET | Freq: Two times a day (BID) | ORAL | Status: DC
  Administered 2014-05-28 – 2014-06-02 (×7): 1 via ORAL
  Filled 2014-05-28 (×9): qty 1

## 2014-05-28 MED ORDER — SODIUM CHLORIDE 0.9 % IJ SOLN
10.0000 mL | Freq: Two times a day (BID) | INTRAMUSCULAR | Status: DC
  Administered 2014-05-28: 12:00:00 10 mL via INTRAVENOUS

## 2014-05-28 MED ORDER — BISACODYL 10 MG RE SUPP
10.0000 mg | Freq: Every day | RECTAL | Status: DC
Start: 2014-05-28 — End: 2014-06-02
  Administered 2014-05-28: 10 mg via RECTAL
  Filled 2014-05-28 (×4): qty 1

## 2014-05-28 MED ORDER — CEPHALEXIN 500 MG PO CAPS
500.0000 mg | ORAL_CAPSULE | Freq: Two times a day (BID) | ORAL | Status: DC
  Administered 2014-05-28 – 2014-05-29 (×3): 500 mg via ORAL
  Filled 2014-05-28 (×3): qty 1

## 2014-05-28 MED ORDER — SODIUM CHLORIDE 0.9 % IJ SOLN: 10.0000 mL | INTRAMUSCULAR | Status: DC | PRN

## 2014-05-28 NOTE — Progress Notes (Signed)
[place] [facility] [place] NAME: [name] [name]    MR#:  [id number]  DATE OF BIRTH:  Dec 26, 1955  SUBJECTIVE:  CHIEF COMPLAINT:   Chief Complaint  Patient presents with  . Abdominal Pain    Pt eating well although this am has cramping pain in her abdomen and feels constipated. Has h/o hemorrhoids. Fever of 103 this am. C/o dysuria  REVIEW OF SYSTEMS:  CONSTITUTIONAL: No fever, fatigue or weakness.  EYES: No blurred or double vision.  EARS, NOSE, AND THROAT: No tinnitus or ear pain.  RESPIRATORY: No cough, shortness of breath, wheezing or hemoptysis.  CARDIOVASCULAR: No chest pain, orthopnea, edema.  GASTROINTESTINAL: No nausea, vomiting, diarrhea or abdominal pain.  GENITOURINARY: No dysuria, hematuria.  ENDOCRINE: No polyuria, nocturia,  HEMATOLOGY: No anemia, easy bruising or bleeding SKIN: No rash or lesion. MUSCULOSKELETAL: No joint pain or arthritis.   NEUROLOGIC: No tingling, numbness, weakness.  PSYCHIATRY: No anxiety or depression.   DRUG ALLERGIES:   Allergies  Allergen Reactions  . Aspirin Itching    VITALS:  Blood pressure 162/57, pulse 129, temperature 103 F (39.4 C), temperature source Oral, resp. rate 18, height '5\' 6"'$  (1.676 m), weight 39.009 kg (86 lb), SpO2 99 %.  PHYSICAL EXAMINATION:  GENERAL:  59 y.o.-year-old patient lying in the bed with no acute distress. cachectic EYES: Pupils equal, round, reactive to light and accommodation. No scleral icterus. Extraocular muscles intact.  HEENT: Head atraumatic, normocephalic. Oropharynx and nasopharynx clear.  NECK:  Supple, no jugular venous distention. No thyroid enlargement, no tenderness.  LUNGS: Normal breath sounds bilaterally, no wheezing, rales,rhonchi or crepitation. No use of accessory muscles of respiration.  CARDIOVASCULAR: S1, S2 normal. No murmurs, rubs, or gallops.  ABDOMEN: Soft, lower abdomen tender, nondistended. Bowel sounds present. No  organomegaly or mass.     PEG tube in place. EXTREMITIES: No pedal edema, cyanosis, or clubbing.  NEUROLOGIC: Cranial nerves II through XII are intact. Muscle strength 5/5 in all extremities. Sensation intact. Gait not checked.  PSYCHIATRIC: The patient is alert and oriented x 3.  SKIN: No obvious rash, lesion, or ulcer.    LABORATORY PANEL:   CBC  Recent Labs Lab 05/28/14 0601  WBC 15.2*  HGB 9.4*  HCT 28.5*  PLT 376   ------------------------------------------------------------------------------------------------------------------  Chemistries   Recent Labs Lab 05/26/14 1239 05/27/14 0458  NA 137 138  K 3.8 3.6  CL 104 107  CO2 27 26  GLUCOSE 97 90  BUN 16 8  CREATININE <0.30* <0.30*  CALCIUM 7.9* 8.0*  AST 24  --   ALT 33  --   ALKPHOS 78  --   BILITOT 0.3  --    ------------------------------------------------------------------------------------------------------------------  Cardiac Enzymes No results for input(s): TROPONINI in the last 168 hours. ------------------------------------------------------------------------------------------------------------------  RADIOLOGY:  Ct Abdomen Pelvis W Contrast  05/26/2014   CLINICAL DATA:  Acute onset abdominal pain 05/25/2014. Patient status post gastrostomy tube placement 1 month ago now with sharp pain about the tube. History of bowel obstruction.  EXAM: CT ABDOMEN AND PELVIS WITH CONTRAST  TECHNIQUE: Multidetector CT imaging of the abdomen and pelvis was performed using the standard protocol following bolus administration of intravenous contrast.  CONTRAST:  80 mL OMNIPAQUE IOHEXOL 300 MG/ML  SOLN  COMPARISON:  CT abdomen and pelvis 03/17/2014 and 09/29/2013.  FINDINGS: The lung bases appear emphysematous but are clear. There is no pleural or pericardial effusion.  Gastrostomy tube is in place with the tip in the stomach.  No fluid collection about the tube is identified. There is some thickening of the walls of the  stomach just distal to the tube on early phase imaging which appears resolve on delayed imaging. The small and large bowel are unremarkable. The appendix is normal in appearance. Mild mesenteric edema is identified. There is no focal fluid collection.  The gallbladder is completely decompressed but otherwise unremarkable. Minimal intrahepatic biliary ductal prominence is unchanged. The pancreas, adrenal glands, spleen and kidneys appear normal. Linear areas of high density just anterior to the lower pole of the left kidney and centrally in the pelvis may be postoperative in nature and are not seen on the prior examination. No lymphadenopathy is identified. Mild subcutaneous edema is seen throughout. The patient is status post tubal ligation. Fundal fibroid is identified as on the prior exam.  No focal bony abnormality is identified.  IMPRESSION: Gastrostomy tube is in good position without evidence of complicating feature.  Thickening of the walls of stomach just distal to the tip of the tube nearly completely resolves on delayed imaging but could reflect gastritis.  Mesenteric and body wall edema compatible with anasarca.   Electronically Signed   By: Inge Rise M.D.   On: 05/26/2014 14:17   Dg Chest Portable 1 View  05/26/2014   CLINICAL DATA:  59 year old female central line placement. Initial encounter.  EXAM: PORTABLE CHEST - 1 VIEW  COMPARISON:  09/29/2013 and earlier.  FINDINGS: Portable AP upright view at 1154 hrs. Double lumen tunneled type right chest catheter is in place. Catheter tip projects at the lower SVC level about 1 vertebral body below the carina. No pneumothorax. Normal cardiac size and mediastinal contours. Allowing for portable technique, the lungs are clear. Partially visible tubing projecting in the epigastrium.  IMPRESSION: Right chest tunneled type central line in place, tip at the SVC level.  No acute cardiopulmonary abnormality.   Electronically Signed   By: Genevie Ann M.D.   On:  05/26/2014 12:05     ASSESSMENT AND PLAN:   *  Abdominal pain       Likely Gastritis- eating regular food, CT no acute findings.  *Constipation with h/o hemorrhoids -senna -dulcolax today  *fever 103 with dysurai and UA wth WBC and LE+ -keflex for 5 days        *  Protein-calorie malnutrition, severe       As per pt- she was on TPN daily- on top of her regular diet- to help with nutrition.        Will get Dietary consult to help with decide TPN as out pt- as pt request to go home    * COPD: Seems to be stable at this time. Symbicort.  * Tobacco dependence    councelled to quit for 4 min.        * generalized weakness     Was in rehab before admission, want to go home.     Spoke to [facility]- and got PT eval to decide.     Arrange HH to help with TPN  All the records are reviewed and case discussed with Care Management/Social Workerr. Management plans discussed with the patient, family and they are in agreement.  CODE STATUS: full code  TOTAL TIME TAKING CARE OF THIS PATIENT: 35 minutes.   POSSIBLE D/C IN 1-2 DAYS, DEPENDING ON CLINICAL CONDITION.   Alzina Golda M.D on 05/28/2014 at 8:23 AM  Between 7am to 6pm - Pager - [phone]  After 6pm go to [email or web address] -  password [email or web address] [facility]  [facility]  Office  [phone]  CC: Primary care physician; Cletis Athens, MD

## 2014-05-28 NOTE — Progress Notes (Signed)
PT Cancellation Note  Patient Details Name: [name] [name] MRN: [id number] DOB: 29-Aug-1955   Cancelled Treatment:    Reason Eval/Treat Not Completed: Medical issues which prohibited therapy;Patient not medically ready (RN reports new vitals not established yet since tachy/fever.) Will attempt PT Eval at later date/time per RN request.    Jesenya Bowditch C 05/28/2014, 11:09 AM   Etta Grandchild, PT, DPT, BM 11:09 AM  05/28/2014

## 2014-05-28 NOTE — Progress Notes (Addendum)
Spoke with Dr. Reece Levy about pt c/o of burning when voiding.  Pt also has a fever of 103. MD to put in order for Urine Comp.  Dr. Reece Levy would like rounding MD to follow up.  No blood culture ordered at this time.

## 2014-05-28 NOTE — Care Management (Signed)
Would like to go home when discharged not back to [facility] when discharged, Would like to home [facility] for services. [phone]). [name] has TPN.  Dietitian updated. [phone]) Shelbie Ammons RN MSN Care Management [phone]

## 2014-05-28 NOTE — Plan of Care (Addendum)
Problem: Discharge Progression Outcomes Goal: Other Discharge Outcomes/Goals Outcome: Not Progressing Plan of Care progress to goal for:  Pain-pt given prn meds for pain with improvement Hemodynamically-NS '@100ml'$ /hr, pt continue to have increased bowel movements Complications-pt continue to have increased bowel movements, c-diff negative, pt fever 103 and c/o burning when voiding. Diet-pt ate to much earlier in the day and has been not feeling well. Activity-pt up to bathroom with assistance

## 2014-05-28 NOTE — Progress Notes (Addendum)
Nutrition Follow-up  DOCUMENTATION CODES:  Severe malnutrition in context of chronic illness  INTERVENTION:  Medical Nutrition Supplement Therapy:  Ensure Enlive (each supplement provides 350kcal and 20 grams of protein)  PN: Spoke with MD, Posey Pronto.  Agreeable to not start TPN today. Spoke with Hassan Rowan, Care Management and provided recommendations for TPN once discharged home.   Recommend 5%AA/20%Dextrose TPN at 16m/hr for 24 hours with 20% lipids twice a week; to provide 1447kcals and 66g protein daily (85% of estimated energy needs using IBW and 93% of protein needs using IBW), with MVI and trace elements according to pharmacist. Recommend pharmacist and dietitian to follow with home health agent for further management. MD Patel aware.  Coordination of Care: pt may benefit from checking Phosphorus and Magnesium levels as well  NUTRITION DIAGNOSIS:  Impaired nutrient utilization related to altered GI function as evidenced by moderate depletions of muscle mass, severe depletion of muscle mass.  GOAL:  Patient will meet greater than or equal to 90% of their needs; pt eating 100% of meals this am  MONITOR:  PO intake, Other (Comment)  REASON FOR ASSESSMENT:  Consult New TPN/TNA  ASSESSMENT: [facility]. RD [facility] was 80g AA, 200g Dextrose and 45g lipids providing 1450kcals and 80g protein daily; TPN scheduled for 12 hours daily at 1655mhr from midnight to noon. RD also reports pt labs were WDL this past Monday 5/2.  Medications: NS at 10054mr, vitamin B12, 82m53mee water TID via PEG tube, Protonix, Senna  Labs: Electrolyte and Renal Profile:   Recent Labs Lab 05/26/14 1239 05/27/14 0458  BUN 16 8  CREATININE <0.30* <0.30*  NA 137 138  K 3.8 3.6   Glucose Profile: No results for input(s): GLUCAP in the last 72 hours.   Protein Profile:  Recent Labs Lab  05/26/14 1239  ALBUMIN 2.6*    Height:  Ht Readings from Last 1 Encounters:  05/26/14 '5\' 6"'$  (1.676 m)    Weight:  Wt Readings from Last 1 Encounters:  05/26/14 86 lb (39.009 kg)    Ideal Body Weight:   59kg  Wt Readings from Last 10 Encounters:  05/26/14 86 lb (39.009 kg)    BMI:  Body mass index is 13.89 kg/(m^2).  Estimated Nutritional Needs:  Kcal:  17033810-1751ls/d BEE 1191 kcals (IF 1.1-1.3, AF 1.3) Using IBW of 59kg  Protein:  71-89 gm/d (1.2-1.5 gm/kg) Using IBW of 59kg  Fluid:  1475-1770ml75m25-30ml/63mUsing IBW of 59kg  Skin:  Reviewed, no issues  Diet Order:  Diet regular Room service appropriate?: Yes; Fluid consistency:: Thin  Intake/Output Summary (Last 24 hours) at 05/28/14 1548 Last data filed at 05/28/14 1300  Gross per 24 hour  Intake      0 ml  Output    300 ml  Net   -300 ml    Last BM:  Loose stools documented  HIGH Care Level  AllysoDwyane LuoLDN Pager ([phone]) [phone]

## 2014-05-29 ENCOUNTER — Inpatient Hospital Stay: Payer: Medicare Other

## 2014-05-29 LAB — CBC
HCT: 23.4 % — ABNORMAL LOW (ref 35.0–47.0)
HCT: 23.1 % — ABNORMAL LOW (ref 35.0–47.0)
Hemoglobin: 7.5 g/dL — ABNORMAL LOW (ref 12.0–16.0)
Hemoglobin: 7.2 g/dL — ABNORMAL LOW (ref 12.0–16.0)
MCH: 30.7 pg (ref 26.0–34.0)
MCH: 30.1 pg (ref 26.0–34.0)
MCHC: 32.1 g/dL (ref 32.0–36.0)
MCHC: 31.3 g/dL — ABNORMAL LOW (ref 32.0–36.0)
MCV: 95.5 fL (ref 80.0–100.0)
MCV: 96 fL (ref 80.0–100.0)
Platelets: 324 10*3/uL (ref 150–440)
Platelets: 323 10*3/uL (ref 150–440)
RBC: 2.45 MIL/uL — ABNORMAL LOW (ref 3.80–5.20)
RBC: 2.41 MIL/uL — ABNORMAL LOW (ref 3.80–5.20)
RDW: 14.8 % — ABNORMAL HIGH (ref 11.5–14.5)
RDW: 14.9 % — ABNORMAL HIGH (ref 11.5–14.5)
WBC: 19.6 10*3/uL — ABNORMAL HIGH (ref 3.6–11.0)
WBC: 19.4 10*3/uL — ABNORMAL HIGH (ref 3.6–11.0)

## 2014-05-29 LAB — BASIC METABOLIC PANEL
Anion gap: 5 (ref 5–15)
BUN: 7 mg/dL (ref 6–20)
CO2: 25 mmol/L (ref 22–32)
Calcium: 7.9 mg/dL — ABNORMAL LOW (ref 8.9–10.3)
Chloride: 106 mmol/L (ref 101–111)
Creatinine, Ser: 0.34 mg/dL — ABNORMAL LOW (ref 0.44–1.00)
GFR calc Af Amer: >60 mL/min (ref >60)
GFR calc non Af Amer: >60 mL/min (ref >60)
Glucose, Bld: 75 mg/dL (ref 65–99)
Potassium: 3 mmol/L — ABNORMAL LOW (ref 3.5–5.1)
Sodium: 136 mmol/L (ref 135–145)

## 2014-05-29 LAB — PREPARE RBC (CROSSMATCH)

## 2014-05-29 LAB — GLUCOSE, CAPILLARY: Glucose-Capillary: 104 mg/dL — ABNORMAL HIGH (ref 70–99)

## 2014-05-29 LAB — MAGNESIUM: Magnesium: 1.5 mg/dL — ABNORMAL LOW (ref 1.7–2.4)

## 2014-05-29 MED ORDER — PROMETHAZINE HCL 25 MG/ML IJ SOLN
INTRAMUSCULAR | Status: AC
  Administered 2014-05-29: 25 mg via INTRAVENOUS
  Filled 2014-05-29: qty 1

## 2014-05-29 MED ORDER — TRACE MINERALS CR-CU-MN-SE-ZN 10-1000-500-60 MCG/ML IV SOLN
INTRAVENOUS | Status: AC
  Administered 2014-05-29: 19:00:00 via INTRAVENOUS
  Filled 2014-05-29: qty 720

## 2014-05-29 MED ORDER — ALUM & MAG HYDROXIDE-SIMETH 200-200-20 MG/5ML PO SUSP: 30.0000 mL | Freq: Four times a day (QID) | ORAL | Status: DC | PRN

## 2014-05-29 MED ORDER — MORPHINE SULFATE 2 MG/ML IJ SOLN
2.0000 mg | INTRAMUSCULAR | Status: DC | PRN
  Administered 2014-05-29 – 2014-05-31 (×10): 2 mg via INTRAVENOUS
  Filled 2014-05-29 (×10): qty 1

## 2014-05-29 MED ORDER — CEPHALEXIN 500 MG PO CAPS: 500.0000 mg | ORAL_CAPSULE | Freq: Two times a day (BID) | ORAL | Status: DC

## 2014-05-29 MED ORDER — INSULIN ASPART 100 UNIT/ML ~~LOC~~ SOLN
0.0000 [IU] | SUBCUTANEOUS | Status: DC
  Administered 2014-05-30 (×2): 1 [IU] via SUBCUTANEOUS
  Administered 2014-05-31: 2 [IU] via SUBCUTANEOUS
  Administered 2014-05-31: 1 [IU] via SUBCUTANEOUS
  Administered 2014-06-01: 05:00:00 2 [IU] via SUBCUTANEOUS
  Administered 2014-06-01: 1 [IU] via SUBCUTANEOUS
  Filled 2014-05-29: qty 1
  Filled 2014-05-29: qty 2
  Filled 2014-05-29 (×3): qty 1
  Filled 2014-05-29: qty 2

## 2014-05-29 MED ORDER — ONDANSETRON HCL 4 MG PO TABS: 4.0000 mg | ORAL_TABLET | Freq: Four times a day (QID) | ORAL | Status: DC | PRN

## 2014-05-29 MED ORDER — PROMETHAZINE HCL 25 MG/ML IJ SOLN
25.0000 mg | Freq: Four times a day (QID) | INTRAMUSCULAR | Status: DC | PRN
  Administered 2014-05-29: 25 mg via INTRAVENOUS

## 2014-05-29 MED ORDER — SODIUM CHLORIDE 0.9 % IV SOLN: Freq: Once | INTRAVENOUS | Status: DC

## 2014-05-29 MED ORDER — FREE WATER: 60.0000 mL | Freq: Three times a day (TID) | Status: DC

## 2014-05-29 MED ORDER — PIPERACILLIN-TAZOBACTAM 3.375 G IVPB
3.3750 g | Freq: Three times a day (TID) | INTRAVENOUS | Status: DC
  Administered 2014-05-29 – 2014-06-01 (×9): 3.375 g via INTRAVENOUS
  Filled 2014-05-29 (×13): qty 50

## 2014-05-29 MED ORDER — FLUTICASONE PROPIONATE 50 MCG/ACT NA SUSP
2.0000 | Freq: Every day | NASAL | Status: DC
  Administered 2014-05-29 – 2014-06-02 (×5): 2 via NASAL
  Filled 2014-05-29: qty 16

## 2014-05-29 NOTE — Consult Note (Signed)
Reason for Consult: Abdominal pain Referring Physician: Gus Height Patel,MD.  [name] [name] is an 59 y.o. female.  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 She had laparotomy with bowel resection and followed by second look laparotomy followed by anastomosis of small bowel to the second portion of the duodenum. She does have an ileocecal valve. From what I can tell she has probably 70 cm of small intestine left. She was discharged to rehabilitation on total parenteral nutrition. She was admitted to this hospital on May 2 with abdominal pain. She's developed melena and bloody stools and had a drop in her hemoglobin. She is currently getting 1 unit of blood transfusion tonight. CT scan of the abdomen and pelvis was personally reviewed. No specific findings other than possible gastritis around the G-tube site was noted. There are no signs of bowel obstruction or pneumatosis. During her hospitalization she's also had a fever which has been attributed to possible urinary tract infection versus line sepsis.  Past medical history significant for COPD, cocaine use, SMA syndrome and ischemic bowel.  Past surgical history significant for expiratory laparotomy with bowel resection followed by second look laparotomy further bowel resection and open cholecystectomy.  History reviewed. No pertinent family history.  Social History:  reports that she has been smoking Cigarettes.  She has been smoking about 1.00 pack per day. She does not have any smokeless tobacco history on file. She reports that she does not drink alcohol or use illicit drugs. there is a history of cocaine use   Allergies:  Allergies  Allergen Reactions  . Aspirin Itching   Current medications ;  include TPN,  Tylenol, no clicks, Plavix, Restasis drops, dental, ensure, fluticasone, sliding scale insulin, morphine, Zofran, Protonix, Zosyn, Lyrica, Phenergan, Senokot S, Vit B12.  CBC Latest Ref Rng 05/29/2014 05/29/2014 05/28/2014  WBC 3.6 - 11.0 K/uL 19.4(H) 19.6(H) 15.2(H)  Hemoglobin 12.0 - 16.0 g/dL 7.2(L) 7.5(L) 9.4(L)  Hematocrit 35.0 - 47.0 % 23.1(L) 23.4(L) 28.5(L)  Platelets 150 - 440 K/uL 323 324 376     Electrolytes are unremarkable except for hypokalemia. Urinalysis is unremarkable.  Review of CT scan of the abdomen and pelvis and other x-rays are personally performed on the PACS monitor and agree with their interpretation.  Review of Systems  Constitutional: Positive for fever, chills and weight loss.  HENT: Negative.   Eyes: Negative.   Respiratory: Negative.   Cardiovascular: Negative.   Gastrointestinal: Positive for nausea, vomiting, abdominal pain, blood in stool and melena.  Genitourinary: Positive for dysuria.  Musculoskeletal: Negative.   Skin: Negative for itching and rash.  Neurological: Positive for weakness.  Endo/Heme/Allergies: Negative.   Psychiatric/Behavioral: Negative.     Blood pressure 146/55, pulse 100, temperature 99.1 F (37.3 C), temperature source Oral, resp. rate 18, height '5\' 6"'$  (1.676 m), weight 39.009 kg (86 lb), SpO2 100 %.     Physical Exam  Constitutional: She is oriented to person, place, and time.  Cachectic black female in no obvious distress  HENT:  Head: Normocephalic.  Eyes: Conjunctivae are normal. Pupils are equal, round, and reactive to light.  Neck: Normal range of motion.  Cardiovascular: Normal rate, regular rhythm and normal heart sounds.   Respiratory: Effort normal and breath sounds normal. No respiratory distress. She has no wheezes.  GI:  Soft. She exhibits no distension and no mass. There is tenderness. There is no rebound and no guarding.  There is a G-tube in place in the left upper quadrant there is a healed midline  incision with no evidence of hernia. There is no distention no peritoneal signs are noted. The abdomen is scaphoid.  Neurological: She is oriented to person, place, and time.  Skin: Skin is warm.  Psychiatric: She has a normal mood and affect. Her behavior is normal. Judgment and thought content normal.    Assessment/Plan:  59 year-old black female with abdominal pain and likely gastritis. She had a recent massive small bowel resection secondary to intestinal ischemia and SMA syndrome. She also had a cholecystectomy and additional bowel resection at a second look laparotomy following an episode of acute intestinal ischemia.  Review of the CT scan demonstrates no evidence of postoperative fluid collection leak bowel obstruction or abscess. I believe she has had a drop in her hemoglobin likely secondary to acute gastritis. I would transfuse her as we are and perform an upper endoscopy if we are able to. The patient and family have requested return to [facility] for further care and transfer at this time is pending bed availability. I agree with continued TPN supplementation. Overall poor prognosis long-term.  I see no indication for acute surgical intervention at this time.   Yajaira Doffing, [place] 05/29/2014, 8:56 PM

## 2014-05-29 NOTE — Plan of Care (Signed)
Problem: Discharge Progression Outcomes Goal: Discharge plan in place and appropriate Outcome: Progressing C/o lower abd crampsx1, tylenol given with good effect. No c/o n/v. 1xliquid, brown stool during the shift. Per lab Hgb this am 7.5, results not posted. Paged MD if to order Hgb rechecked, no response yet. Day nurse notified to follow up. No signs of bleeding.

## 2014-05-29 NOTE — Progress Notes (Signed)
Nutrition Follow-up  DOCUMENTATION CODES:  Severe malnutrition in context of chronic illness  INTERVENTION:  PN: Verbal request per MD, Posey Pronto to initiate TPN for pt today.  Recommend 5%AA/20%Dextrose TPN at 58m/hr for 24 hours with 20% lipids twice a week; to provide 1447kcals and 66g protein daily (85% of estimated energy needs using IBW and 93% of protein needs using IBW), with standard MVI and trace elements added. Will start TPN at 353mhr and monitor electrolytes with am TPN panel. Pharmacy consulted to aid with electrolytes and glucose. Will also request daily weights, I&O monitoring and FSBS q6hr after initiation.  Medical Nutrition supplement Therapy: Ensure Enlive (each supplement provides 350kcal and 20 grams of protein)  NUTRITION DIAGNOSIS:  Impaired nutrient utilization related to altered GI function as evidenced by moderate depletions of muscle mass, severe depletion of muscle mass, ongoing  GOAL:  Patient will meet greater than or equal to 90% of their needs via PN and po intake. Goal for pt to also tolerate initiation of TPN  MONITOR:  PO intake, Other (Comment)  PN  REASON FOR ASSESSMENT:  Consult New TPN/TNA  ASSESSMENT:  [facility] MD, PaPosey Prontoequesting TPN initiation. Pt with abdominal pain and melena.  Labs:  Pending for today   Medications: NS at 10033mr, vitamin B12, 71m46mee water via PEG tube, Protonix Height:  Ht Readings from Last 1 Encounters:  05/26/14 '5\' 6"'$  (1.676 m)    Weight:  Wt Readings from Last 1 Encounters:  05/26/14 86 lb (39.009 kg)    Ideal Body Weight:     Wt Readings from Last 10 Encounters:  05/26/14 86 lb (39.009 kg)    BMI:  Body mass index is 13.89 kg/(m^2).  Estimated Nutritional Needs:  Kcal:  17037564-3329ls/d BEE 1191 kcals (IF 1.1-1.3, AF 1.3) Using IBW of 59kg  Protein:  71-89 gm/d (1.2-1.5 gm/kg) Using IBW of 59kg  Fluid:  1475-1770ml19m25-30ml/21mUsing IBW of  59kg  Skin:  Reviewed, no issues  Diet Order:  Diet regular Room service appropriate?: Yes; Fluid consistency:: Thin .TPN (CLINIMIX-E) Adult  EDUCATION NEEDS:  No education needs identified at this time   Intake/Output Summary (Last 24 hours) at 05/29/14 1349 Last data filed at 05/29/14 1016  Gross per 24 hour  Intake   5962 ml  Output    251 ml  Net   5711 ml    Last BM:  Multiple loose stools  HIGH Care Level   AllysoDwyane LuoLDN Pager [phone]

## 2014-05-29 NOTE — Evaluation (Addendum)
Physical Therapy Evaluation Patient Details Name: [name] [name] MRN: [id number] DOB: [birth year] Today's Date: 05/29/2014   History of Present Illness  Pt is a slender 59yo black female who presents with malaise and abdominal pain. Pt was admitted at [facility] last month for 27 days, where she underwent abdominal GI surgery. Pt DC to [facility] for rehab for general strengthening, and arrived at [facility] after 10 days with additional abdominal pain.  Clinical Impression  Pt presenting with general distress, including flat affect, localized abdominal pain, mild fever, tachycardia, and malaise. Pt agreeable to minimal activity with PT for basis of assessment. Pt ModI with bed mobility and transfers, and amb with SPC at supervision, reporting legs to feel strong, but limited by c/o pain. Pt presenting with impairment of activity tolerance and pain, limiting indep in ADL, IADL, and mobility. Pt will benefit from skilled intervention to restore to prior level of function, and to decrease caregiver burden.       Follow Up Recommendations Home health PT;Supervision - Intermittent    Equipment Recommendations       Recommendations for Other Services       Precautions / Restrictions Precautions Precautions: Fall Restrictions Weight Bearing Restrictions: No      Mobility  Bed Mobility Overal bed mobility: Modified Independent                Transfers Overall transfer level: Modified independent Equipment used: Straight cane                Ambulation/Gait Ambulation/Gait assistance: Supervision Ambulation Distance (Feet): 30 Feet Assistive device: Straight cane Gait Pattern/deviations: Antalgic;Trunk flexed   Gait velocity interpretation: <1.8 ft/sec, indicative of risk for recurrent falls    Stairs            Wheelchair Mobility    Modified Rankin (Stroke Patients Only)       Balance Overall balance assessment: No apparent balance deficits (not formally  assessed)                                           Pertinent Vitals/Pain Pain Location: Abdomen; pt does not rate pain.  Pain Intervention(s): Limited activity within patient's tolerance;Monitored during session    [place] expects to be discharged to:: Private residence Living Arrangements: Alone Available Help at Discharge: Family;Available 24 hours/day (Brother able to provide 24/7) Type of Home: House Home Access: Level entry     Home Layout: One level Home Equipment: Cane - single point      Prior Function Level of Independence: Independent with assistive device(s)   Gait / Transfers Assistance Needed: ModI for limited community ambulation with SPC.   ADL's / Homemaking Assistance Needed: Unable to perform. In [place] at [facility].         Hand Dominance   Dominant Hand: Left    Extremity/Trunk Assessment   Upper Extremity Assessment: Overall WFL for tasks assessed           Lower Extremity Assessment: Overall WFL for tasks assessed      Cervical / Trunk Assessment: Kyphotic  Communication      Cognition Arousal/Alertness: Lethargic Behavior During Therapy: WFL for tasks assessed/performed;Flat affect Overall Cognitive Status: Within Functional Limits for tasks assessed                      General Comments  Exercises        Assessment/Plan     PT Assessment    PT Diagnosis Difficulty walking;Generalized weakness;Acute pain   PT Problem List    PT Treatment Interventions     PT Goals (Current goals can be found in the Care Plan section) Acute Rehab PT Goals Patient Stated Goal: DC to home with help from brother  PT Goal Formulation: With patient Time For Goal Achievement: 06/12/14 Potential to Achieve Goals: Fair    Frequency Min 2X/week   Barriers to discharge        Co-evaluation               End of Session Equipment Utilized During Treatment:  (no gait belt used  secondary to c/o of abd pain, G tube) Activity Tolerance: Patient limited by pain;Patient limited by lethargy Patient left: in bed;with call bell/phone within reach;with bed alarm set Nurse Communication: Other (comment)         Time: 5747-3403 PT Time Calculation (min) (ACUTE ONLY): 12 min   Charges:   PT Evaluation $Initial PT Evaluation Tier I: 1 Procedure     PT G Codes:        Jadrien Narine C June 27, 2014, 4:01 PM  Etta Grandchild, PT, DPT, BM  Addendum: Updated Frequency for PT treatment Etta Grandchild, PT, DPT, BM

## 2014-05-29 NOTE — Progress Notes (Signed)
Patient discussed in unit rounds with RN and have also spoken with PT. Patient is planning to go home at dc and not back to SNF bed- per MD, they are also attempting to get her transferred to [facility] if bed available. CSW will sign off at this time- please reconsult if CSW needs arise.     Eduard Clos, MSW, Latanya Presser

## 2014-05-29 NOTE — Progress Notes (Signed)
Paged hospitalist: Per lab Hgb this am is 7.5, yesterday was 9.4. No signs of bleed. Result is not posted yet by the lab. Do you want to order a recheck Hgb? Waiting for a call back.

## 2014-05-29 NOTE — Discharge Summary (Signed)
[facility] [place] NAME: Anasofia Micallef    MR#:  [id number]  DATE OF BIRTH:  1956-01-14  DATE OF ADMISSION:  05/26/2014 ADMITTING PHYSICIAN: Bettey Costa, MD  DATE OF DISCHARGE: 05/29/14  PRIMARY CARE PHYSICIAN: MASOUD,JAVED, MD    ADMISSION DIAGNOSIS:  Gastrointestinal hemorrhage with melena [K92.1] Abdominal pain, unspecified abdominal location [R10.9] Anemia, unspecified anemia type [D64.9]  DISCHARGE DIAGNOSIS:  Generalzied abdominal pain H/o chronic mesenteric ischemia s/p recent exploratory laparotomy at [facility] April 2016 -severe protein calorie malnutrition   SECONDARY DIAGNOSIS:   Past Medical History  Diagnosis Date  . COPD (chronic obstructive pulmonary disease)   . Bowel obstruction     x1 month ago    HOSPITAL COURSE:   * Generalized Abdominal pain - CT no acute findings on admission.  -recently underwent exploratory laparotomy at [facility] for Chronic mesenteric ischemia. Had G-tube place. Not any but gets TPN for nutrition -prn morphine for pain -Xray KUB negative -pt and family requesting transfer to [facility]. Spoke with Dr Nettie Elm. Accepted pt. Await bed availability and transfer -Surgical consult with Dr Elliot Cousin  *Constipation with h/o hemorrhoids -senna and -dulcolax y'day -pt reports some bloody stools but none seen by RN  *fever 103 with dysurai and UA wth WBC and LE+ -keflex for 5 days   * Protein-calorie malnutrition, severe As per pt- she was on TPN daily- on top of her regular diet- to help with nutrition. Dietary consult to help with TPN   * COPD: Seems to be stable at this time. Symbicort.  * Tobacco dependence  councelled to quit for 4 min.   * generalized weakness  Was in rehab before admission, want to go home.  Spoke to [place]- and got PT eval to decide.  *awaiting transfer to [place]:   fair  CONSULTS OBTAINED:     DRUG ALLERGIES:   Allergies   Allergen Reactions  . Aspirin Itching    DISCHARGE MEDICATIONS:   Current Discharge Medication List    START taking these medications   Details  alum & mag hydroxide-simeth (MAALOX/MYLANTA) 200-200-20 MG/5ML suspension Take 30 mLs by mouth every 6 (six) hours as needed for indigestion or heartburn (dyspepsia). Qty: 355 mL, Refills: 0    cephALEXin (KEFLEX) 500 MG capsule Take 1 capsule (500 mg total) by mouth every 12 (twelve) hours. Qty: 14 capsule, Refills: 0    !! ondansetron (ZOFRAN) 4 MG tablet Take 1 tablet (4 mg total) by mouth every 6 (six) hours as needed for nausea. Qty: 20 tablet, Refills: 0    Water For Irrigation, Sterile (FREE WATER) SOLN Place 60 mLs into feeding tube every 8 (eight) hours. Qty: 60 mL, Refills: 0     !! - Potential duplicate medications found. Please discuss with provider.    CONTINUE these medications which have NOT CHANGED   Details  acetaminophen (TYLENOL) 160 MG/5ML solution Take 649.6 mg by mouth every 6 (six) hours as needed for mild pain.    albuterol (ACCUNEB) 1.25 MG/3ML nebulizer solution Take 1 ampule by nebulization 3 (three) times daily as needed for wheezing.    antiseptic oral rinse (BIOTENE) LIQD Take 5 mLs by mouth as needed for dry mouth.     budesonide-formoterol (SYMBICORT) 160-4.5 MCG/ACT inhaler Inhale 2 puffs into the lungs 2 (two) times daily.    clopidogrel (PLAVIX) 75 MG tablet Take 75 mg by mouth daily.    cyanocobalamin 1000 MCG tablet Take 1,000 mcg by mouth daily.  cycloSPORINE (RESTASIS) 0.05 % ophthalmic emulsion Place 1 drop into both eyes every 12 (twelve) hours.    dextrose 10 % infusion Inject 70 mL/hr into the vein continuous.    dicyclomine (BENTYL) 10 MG capsule Take 10 mg by mouth 4 (four) times daily as needed for spasms.    ergocalciferol (DRISDOL) 8000 UNIT/ML drops Take 50,400 Units by mouth every 7 (seven) days.    fluticasone (FLONASE) 50 MCG/ACT nasal spray Place 2 sprays into both  nostrils daily as needed for rhinitis.    HEPARIN LOCK FLUSH IV Inject 5 mLs into the vein daily.    loperamide (IMODIUM) 2 MG capsule Take 2 mg by mouth 4 (four) times daily as needed for diarrhea or loose stools.    omeprazole (PRILOSEC) 20 MG capsule Take 20 mg by mouth every morning.    !! ondansetron (ZOFRAN) 8 MG tablet Take 8 mg by mouth every 6 (six) hours as needed for nausea or vomiting.    pregabalin (LYRICA) 75 MG capsule Take 75 mg by mouth 2 (two) times daily.    Sodium Chloride Flush (NORMAL SALINE FLUSH IV) Inject 10 mLs into the vein every 12 (twelve) hours.    trolamine salicylate (ASPERCREME) 10 % cream Apply 1 application topically every 8 (eight) hours as needed (for lower back pain).     !! - Potential duplicate medications found. Please discuss with provider.       DISCHARGE INSTRUCTIONS:    Transfer to [facility]  If you experience worsening of your admission symptoms, develop shortness of breath, life threatening emergency, suicidal or homicidal thoughts you must seek medical attention immediately by calling 911 or calling your MD immediately  if symptoms less severe.  You Must read complete instructions/literature along with all the possible adverse reactions/side effects for all the Medicines you take and that have been prescribed to you. Take any new Medicines after you have completely understood and accept all the possible adverse reactions/side effects.   Please note  You were cared for by a hospitalist during your hospital stay. If you have any questions about your discharge medications or the care you received while you were in the hospital after you are discharged, you can call the unit and asked to speak with the hospitalist on call if the hospitalist that took care of you is not available. Once you are discharged, your primary care physician will handle any further medical issues. Please note that NO REFILLS for any discharge medications will be authorized  once you are discharged, as it is imperative that you return to your primary care physician (or establish a relationship with a primary care physician if you do not have one) for your aftercare needs so that they can reassess your need for medications and monitor your lab values.    Today   SUBJECTIVE   C/o abdominal pain with bloody stools. Got bowel prep y'day   VITAL SIGNS:  Blood pressure 138/47, pulse 114, temperature 100.3 F (37.9 C), temperature source Oral, resp. rate 18, height '5\' 6"'$  (1.676 m), weight 39.009 kg (86 lb), SpO2 100 %.  I/O:   Intake/Output Summary (Last 24 hours) at 05/29/14 1210 Last data filed at 05/29/14 1016  Gross per 24 hour  Intake   5962 ml  Output    251 ml  Net   5711 ml    PHYSICAL EXAMINATION:  GENERAL:  59 y.o.-year-old patient lying in the bed with no acute distress.  EYES: Pupils equal, round, reactive to light  and accommodation. No scleral icterus. Extraocular muscles intact.  HEENT: Head atraumatic, normocephalic. Oropharynx and nasopharynx clear.  NECK:  Supple, no jugular venous distention. No thyroid enlargement, no tenderness.  LUNGS: Normal breath sounds bilaterally, no wheezing, rales,rhonchi or crepitation. No use of accessory muscles of respiration.  CARDIOVASCULAR: S1, S2 normal. No murmurs, rubs, or gallops.  ABDOMEN: tenderness + Bowel sounds+ no organomegaly EXTREMITIES: No pedal edema, cyanosis, or clubbing.  NEUROLOGIC: Cranial nerves II through XII are intact. Muscle strength 5/5 in all extremities. Sensation intact. Gait not checked.  PSYCHIATRIC: The patient is alert and oriented x 3.  SKIN: No obvious rash, lesion, or ulcer.   DATA REVIEW:   CBC  Recent Labs Lab 05/28/14 0601  WBC 15.2*  HGB 9.4*  HCT 28.5*  PLT 376    Chemistries   Recent Labs Lab 05/26/14 1239 05/27/14 0458  NA 137 138  K 3.8 3.6  CL 104 107  CO2 27 26  GLUCOSE 97 90  BUN 16 8  CREATININE <0.30* <0.30*  CALCIUM 7.9* 8.0*  AST  24  --   ALT 33  --   ALKPHOS 78  --   BILITOT 0.3  --     Cardiac Enzymes No results for input(s): TROPONINI in the last 168 hours.  Microbiology Results  Results for orders placed or performed during the hospital encounter of 05/26/14  C difficile quick scan w PCR reflex [facility])     Status: None   Collection Time: 05/27/14  5:05 PM  Result Value Ref Range Status   C Diff antigen NEGATIVE  Final   C Diff toxin NEGATIVE  Final   C Diff interpretation Negative for C. difficile  Final    RADIOLOGY:  Dg Abd 1 View  05/29/2014   CLINICAL DATA:  Abdominal tenderness, diarrhea, history of bowel obstruction with surgery  EXAM: ABDOMEN - 1 VIEW  COMPARISON:  CT abdomen pelvis dated 05/26/2014  FINDINGS: Nonobstructive bowel gas pattern.  Gastrostomy tube overlies stomach.  Tubal ligation clips overlying the pelvis.  IMPRESSION: No evidence of bowel obstruction.  Gastrostomy tube overlies the stomach.   Electronically Signed   By: Julian Hy M.D.   On: 05/29/2014 11:13   Management plans discussed with the patient, family and they are in agreement.  CODE STATUS:     Code Status Orders        Start     Ordered   05/26/14 1912  Full code   Continuous     05/26/14 1912      TOTAL TIME TAKING CARE OF THIS PATIENT:35 minutes.    Tateanna Bach M.D on 05/29/2014 at 12:10 PM  Between 7am to 6pm - Pager - [phone] After 6pm go to [email or web address] - password [email or web address] [facility]  Office  [phone]  CC: Primary care physician; Cletis Athens, MD

## 2014-05-29 NOTE — Progress Notes (Signed)
PARENTERAL NUTRITION CONSULT NOTE - INITIAL  Pharmacy Consult for TPN Indication: Bowel Resection/ Protein Calorie Malnutrition  Allergies  Allergen Reactions   Aspirin Itching    Patient Measurements: Height: '5\' 6"'$  (167.6 cm) Weight: 86 lb (39.009 kg) IBW/kg (Calculated) : 59.3  Usual Weight: 39 kg  Vital Signs: Temp: 101.3 F (38.5 C) (05/05 1244) Temp Source: Oral (05/05 1244) BP: 156/59 mmHg (05/05 1244) Pulse Rate: 61 (05/05 1244) Intake/Output from previous day: 05/04 0701 - 05/05 0700 In: 4472 [P.O.:120; I.V.:4352] Out: 250 [Urine:250] Intake/Output from this shift: Total I/O In: 1490 [I.V.:1450; NG/GT:40] Out: 1 [Stool:1]  Labs:  Recent Labs  05/28/14 0601  WBC 15.2*  HGB 9.4*  HCT 28.5*  PLT 376     Recent Labs  05/27/14 0458  NA 138  K 3.6  CL 107  CO2 26  GLUCOSE 90  BUN 8  CREATININE <0.30*  CALCIUM 8.0*   CrCl cannot be calculated (Patient has no serum creatinine result on file.).   No results for input(s): GLUCAP in the last 72 hours.  Medical History: Past Medical History  Diagnosis Date   COPD (chronic obstructive pulmonary disease)    Bowel obstruction     x1 month ago       Insulin Requirements in the past 24 hours:  N/A - sliding scale insulin started 5/5 @ 1215.   Current Nutrition:  Thin fluids, eating 100% of meals  Assessment: Current orders for Clinimix 5/20 @ 55 mL/hr with 20% lipids 2x/weekly are appropriate. MVI and trace elements already added to order. Appropriate labs ordered for 5/6. Scheduled to start 5/5 @ 1800.  Nutritional Goals:  1703 - 2012 kCal/d (using IBW of 59 kg), 71-89 grams of protein per day  Plan:  Continue current orders. Patient is awaiting bed at [facility] for transfer.  Abbott Pao 05/29/2014,12:57 PM

## 2014-05-29 NOTE — Progress Notes (Signed)
Pt febrile with temp of 100.4, WBC count elevated, HR 114, SIRS criteria met. Pt having severe abd pain with guarding and tenderness, BM with dark stool present. Mds notified, awaiting call. Will continue to monitor.

## 2014-05-29 NOTE — Progress Notes (Signed)
Pt's hgb dropped to 7.2. She did report  Bloody stools which were not seen by RN or documented -hgb was 9.4 and now 7.2 -wbc 19.4 -will start IV zosyn -called and left msg for Dr Elliot Cousin to call me back. Surgical consult placed -consider BT if cont to have blood in stools

## 2014-05-29 NOTE — Progress Notes (Signed)
Pt's nurse left after lunch unexpectedly, took over care at 16:00 without receiving report, pt is alert and oriented, pt c/o abdominal pain and headache improved with prn medications, nausea alleviated with zofran, on room air, IV fluids infusing, labs sent off for blood transfusion, resting in bed quietly.

## 2014-05-29 NOTE — Progress Notes (Deleted)
I am assuming care from Dr. Leanora Cover.  I personally reviewed the history physical examination laboratory values. The patient's examination and history is most consistent with acute appendicitis with localized perforation.  I discussed with her and her family in detail laparoscopic appendectomy with possibility of an open procedure including bowel resection.  She understands and wishes to proceed and all of her questions were answered.

## 2014-05-29 NOTE — Progress Notes (Signed)
PT Cancellation Note  Patient Details Name: [name] [name] MRN: [id number] DOB: 06/16/1955   Cancelled Treatment:    Reason Eval/Treat Not Completed: Medical issues which prohibited therapy;Patient not medically ready (Emesis began s/p entry to room; RN requested hold. )   Martena Emanuele C 05/29/2014, 11:36 AM  Etta Grandchild, PT, DPT, BM

## 2014-05-30 LAB — CBC
HCT: 27.1 % — ABNORMAL LOW (ref 35.0–47.0)
Hemoglobin: 8.9 g/dL — ABNORMAL LOW (ref 12.0–16.0)
MCH: 30.7 pg (ref 26.0–34.0)
MCHC: 32.8 g/dL (ref 32.0–36.0)
MCV: 93.6 fL (ref 80.0–100.0)
Platelets: 302 10*3/uL (ref 150–440)
RBC: 2.9 MIL/uL — ABNORMAL LOW (ref 3.80–5.20)
RDW: 15.3 % — ABNORMAL HIGH (ref 11.5–14.5)
WBC: 13.4 10*3/uL — ABNORMAL HIGH (ref 3.6–11.0)

## 2014-05-30 LAB — GLUCOSE, CAPILLARY
Glucose-Capillary: 117 mg/dL — ABNORMAL HIGH (ref 70–99)
Glucose-Capillary: 125 mg/dL — ABNORMAL HIGH (ref 70–99)
Glucose-Capillary: 127 mg/dL — ABNORMAL HIGH (ref 70–99)
Glucose-Capillary: 130 mg/dL — ABNORMAL HIGH (ref 70–99)
Glucose-Capillary: 131 mg/dL — ABNORMAL HIGH (ref 70–99)
Glucose-Capillary: 140 mg/dL — ABNORMAL HIGH (ref 70–99)
Glucose-Capillary: 148 mg/dL — ABNORMAL HIGH (ref 70–99)
Glucose-Capillary: 159 mg/dL — ABNORMAL HIGH (ref 70–99)

## 2014-05-30 LAB — HEMOGLOBIN AND HEMATOCRIT, BLOOD
HCT: 26.9 % — ABNORMAL LOW (ref 35.0–47.0)
Hemoglobin: 8.9 g/dL — ABNORMAL LOW (ref 12.0–16.0)

## 2014-05-30 LAB — BASIC METABOLIC PANEL
Anion gap: 9 (ref 5–15)
BUN: 7 mg/dL (ref 6–20)
CO2: 25 mmol/L (ref 22–32)
Calcium: 8 mg/dL — ABNORMAL LOW (ref 8.9–10.3)
Chloride: 107 mmol/L (ref 101–111)
Creatinine, Ser: 0.34 mg/dL — ABNORMAL LOW (ref 0.44–1.00)
GFR calc Af Amer: >60 mL/min (ref >60)
GFR calc non Af Amer: >60 mL/min (ref >60)
Glucose, Bld: 137 mg/dL — ABNORMAL HIGH (ref 65–99)
Potassium: 3.1 mmol/L — ABNORMAL LOW (ref 3.5–5.1)
Sodium: 141 mmol/L (ref 135–145)

## 2014-05-30 LAB — PREALBUMIN: Prealbumin: 5.8 mg/dL — ABNORMAL LOW (ref 18–38)

## 2014-05-30 LAB — MAGNESIUM: Magnesium: 1.5 mg/dL — ABNORMAL LOW (ref 1.7–2.4)

## 2014-05-30 LAB — TRIGLYCERIDES: Triglycerides: 107 mg/dL (ref <150)

## 2014-05-30 LAB — PHOSPHORUS: Phosphorus: 2.2 mg/dL — ABNORMAL LOW (ref 2.5–4.6)

## 2014-05-30 MED ORDER — TRACE MINERALS CR-CU-MN-SE-ZN 10-1000-500-60 MCG/ML IV SOLN
INTRAVENOUS | Status: AC
  Administered 2014-05-30: 17:00:00 via INTRAVENOUS
  Filled 2014-05-30: qty 1320

## 2014-05-30 MED ORDER — MAGNESIUM SULFATE 2 GM/50ML IV SOLN
2.0000 g | Freq: Once | INTRAVENOUS | Status: AC
  Administered 2014-05-30: 18:00:00 2 g via INTRAVENOUS
  Filled 2014-05-30: qty 50

## 2014-05-30 MED ORDER — PANTOPRAZOLE SODIUM 40 MG IV SOLR
40.0000 mg | Freq: Two times a day (BID) | INTRAVENOUS | Status: DC
  Administered 2014-05-30 – 2014-06-02 (×7): 40 mg via INTRAVENOUS
  Filled 2014-05-30 (×7): qty 40

## 2014-05-30 MED ORDER — POTASSIUM PHOSPHATES 15 MMOLE/5ML IV SOLN
10.0000 meq | Freq: Once | INTRAVENOUS | Status: AC
  Administered 2014-05-30: 11:00:00 10 meq via INTRAVENOUS
  Filled 2014-05-30: qty 2.27

## 2014-05-30 NOTE — Progress Notes (Signed)
[name] [facility] [place] NAME: Eleah Lahaie    MR#:  [id number]  DATE OF BIRTH:  1955/04/08  SUBJECTIVE:  Abdominal pain improving. No more bloody stools. No vomiting. Tolerating FLD  REVIEW OF SYSTEMS:   Review of Systems  Constitutional: Positive for malaise/fatigue. Negative for fever, chills and diaphoresis.  HENT: Negative for congestion.   Eyes: Negative for blurred vision and pain.  Respiratory: Positive for cough. Negative for hemoptysis and shortness of breath.   Cardiovascular: Negative for chest pain, palpitations and orthopnea.  Gastrointestinal: Positive for nausea, abdominal pain and melena. Negative for heartburn.  Genitourinary: Negative for frequency and flank pain.  Musculoskeletal: Negative for back pain.  Neurological: Positive for weakness. Negative for dizziness, tingling, tremors, focal weakness and headaches.  Psychiatric/Behavioral: Negative for depression. The patient is not nervous/anxious.   All other systems reviewed and are negative.  Nutrition: po diet and TPN. Has PEG tube but not needing G tube feeling Tolerating PT: to see Tolerating diet: yes  DRUG ALLERGIES:   Allergies  Allergen Reactions  . Aspirin Itching    VITALS:  Blood pressure 161/63, pulse 102, temperature 100.1 F (37.8 C), temperature source Oral, resp. rate 20, height '5\' 6"'$  (1.676 m), weight 40.325 kg (88 lb 14.4 oz), SpO2 100 %.  PHYSICAL EXAMINATION:  GENERAL:  59 y.o.-year-old patient lying in the bed with no acute distress.  EYES: Pupils equal, round, reactive to light and accommodation. No scleral icterus. Extraocular muscles intact.  HEENT: Head atraumatic, normocephalic. Oropharynx and nasopharynx clear.  NECK:  Supple, no jugular venous distention. No thyroid enlargement, no tenderness.  LUNGS: Normal breath sounds bilaterally, no wheezing, rales,rhonchi or crepitation. No use of accessory muscles of respiration.   CARDIOVASCULAR: S1, S2 normal. No murmurs, rubs, or gallops.  ABDOMEN: Soft, nontender, nondistended. Bowel sounds present. No organomegaly or mass.  EXTREMITIES: No pedal edema, cyanosis, or clubbing.  NEUROLOGIC: Cranial nerves II through XII are intact. Muscle strength 5/5 in all extremities. Sensation intact. Gait not checked.  PSYCHIATRIC: The patient is alert and oriented x 3.  SKIN: No obvious rash, lesion, or ulcer.    LABORATORY PANEL:   CBC  Recent Labs Lab 05/29/14 1152 05/30/14 0300 05/30/14 0648  WBC 19.4*  --  13.4*  HGB 7.2* QUESTIONABLE IDENTIFICATION / INCORRECTLY LABELED SPECIMEN 8.9*  8.9*  HCT 23.1* QUESTIONABLE IDENTIFICATION / INCORRECTLY LABELED SPECIMEN 27.1*  26.9*  PLT 323  --  302    RADIOLOGY:  Dg Abd 1 View  05/29/2014   CLINICAL DATA:  Abdominal tenderness, diarrhea, history of bowel obstruction with surgery  EXAM: ABDOMEN - 1 VIEW  COMPARISON:  CT abdomen pelvis dated 05/26/2014  FINDINGS: Nonobstructive bowel gas pattern.  Gastrostomy tube overlies stomach.  Tubal ligation clips overlying the pelvis.  IMPRESSION: No evidence of bowel obstruction.  Gastrostomy tube overlies the stomach.   Electronically Signed   By: Julian Hy M.D.   On: 05/29/2014 11:13   ASSESSMENT AND PLAN:   * Generalized Abdominal pain -CT no acute findings on admission.  -recently underwent exploratory laparotomy at [facility] for Chronic mesenteric ischemia. Had G-tube place not TF feeding though. Has TPN nightly per [facility] recommendations -prn morphine for pain -Xray KUB negative -pt and family requesting transfer to [facility] ([place] Spoke with Dr Nettie Elm. Accepted pt. Await bed availability and transfer -Surgical consult with Dr mark Marina Gravel noted  *Acute gastritis with bloody/dark stools and drop in hgb 9.2-->7.2 -s/p BT 1 unit on  may 5th -hgb 8.9 -Dr Gustavo Lah to see pt -IV PPI bid  *fever 103 with dysurai and UA wth WBC and LE+, leucocytosis  -on IV  zosyn -15.2--.19.6-->19.4-->13.4   * Protein-calorie malnutrition, severe As per pt- she was on TPN daily- on top of her regular diet- to help with nutrition. Dietary consult to help with TPN   * COPD: Seems to be stable at this time. Symbicort.  * Tobacco dependence  councelled to quit for 4 min.   * generalized weakness Was in rehab before admission, want to go home. Spoke to [facility]- and got PT eval to decide.     All the records are reviewed and case discussed with Care Management/Social Workerr. Management plans discussed with the patient, family and they are in agreement.  CODE STATUS: full code  TOTAL TIME TAKING CARE OF THIS PATIENT: 35 mins.   Pending transfer to [facility]   Rebbecca Osuna M.D on 05/30/2014 at 1:47 PM  Between 7am to 6pm - Pager - [phone] After 6pm go to [email or web address] - password [email or web address] [facility]  Office  [phone]  CC: Primary care physician; Cletis Athens, MD

## 2014-05-30 NOTE — Plan of Care (Addendum)
Problem: Discharge Progression Outcomes Goal: Activity appropriate for discharge plan Pt is alert and oriented x 4, up to bsc with one assist, bm in am, pt c/o abdominal pain improved slightly with morphine, c/o nausea improved with zofran, poor appetite, ensures discontinued per pt request, son at bedside, receiving iv magnesium and potassium replacement, rate of TPN increased.Hgb is stable at 8.9, wbc is slightly elevated, low grade temp throughout shif tylenol givent. Gtube flushed, still awaiting bed at [facility], GI consulted with patient.

## 2014-05-30 NOTE — Progress Notes (Signed)
Nutrition Follow-up  DOCUMENTATION CODES:  Severe malnutrition in context of chronic illness  INTERVENTION:  PN: Recommend increasing TPN to goal rate today of 15m/hr.   Medical Nutrition Supplement: Continue ensure enlive for added nutrition Meals and snack: Cater to pt prefences  NUTRITION DIAGNOSIS:  Impaired nutrient utilization related to altered GI function as evidenced by moderate depletions of muscle mass, severe depletion of muscle mass, ongoing.    GOAL:  Patient will meet greater than or equal to 90% of their needs via PN and po intake, ongoing    MONITOR:  Meals and snacks: PN: Electrolyte and renal profile Glucose profile   ASSESSMENT:  Pt tolerating TPN at this time and po diet  Labs: Electrolyte and Renal Profile:    Recent Labs Lab 05/27/14 0458 05/29/14 1559 05/30/14 0300 05/30/14 0648  BUN 8 7  --  7  CREATININE <0.30* 0.34*  --  0.34*  NA 138 136  --  141  K 3.6 3.0*  --  3.1*  MG  --  1.5*  --  1.5*  PHOS  --   --  2.2*  --    Glucose Profile:  Recent Labs  05/30/14 0621 05/30/14 0953 05/30/14 1117  GLUCAP 127* 140* 148*    Height:  Ht Readings from Last 1 Encounters:  05/26/14 '5\' 6"'$  (1.676 m)    Weight:  Wt Readings from Last 1 Encounters:  05/30/14 88 lb 14.4 oz (40.325 kg)       Wt Readings from Last 10 Encounters:  05/30/14 88 lb 14.4 oz (40.325 kg)    BMI:  Body mass index is 14.36 kg/(m^2).  Estimated Nutritional Needs:  Kcal:  14383-8184kcals/d BEE 1191 kcals (IF 1.1-1.3, AF 1.3) Using IBW of 59kg  Protein:  71-89 gm/d (1.2-1.5 gm/kg) Using IBW of 59kg  Fluid:  1475-17775md (25-3078mg) Using IBW of 59kg  Skin:  Reviewed, no issues  Diet Order:  Diet regular Room service appropriate?: Yes; Fluid consistency:: Thin .TPN (CLINIMIX-E) Adult .TPN (CLINIMIX-E) Adult  EDUCATION NEEDS:  No education needs identified at this time   Intake/Output Summary (Last 24 hours) at 05/30/14 1426 Last data  filed at 05/30/14 1300  Gross per 24 hour  Intake 1366.5 ml  Output    500 ml  Net  866.5 ml    Last BM:  Loose stool noted  High care leve  Laurelyn Terrero B. AllZenia ResidesD,ChurchillDNPillowager)

## 2014-05-30 NOTE — Consult Note (Signed)
GI Inpatient Consult Note Lollie Sails MD  Reason for Consult: Melena/GI bleeding   Attending Requesting Consult: Dr. Fritzi Mandes  Outpatient Primary Physician:   History of Present Illness: [name] [name] is a 59 y.o. female who was admitted to the hospital on 05/26/2014 with nausea vomiting and diarrhea. She has a very complex medical history. About a year ago she developed problems with nausea vomiting and diarrhea. She was seen several times at [facility]. He went to [facility] in March with same symptoms and was admitted to the hospital. I'm she was felt to have a possible. 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 She was discharged from [facility] on 05/12/2014 and went to rehabilitation facility in [place]. He redeveloped symptoms and was brought to [facility]. Apparently the day of or the day after admission she began to develop bloody stools. These continued through yesterday first being more reddish then changing to black. Her last bowel movement was today and this was yellowish in color. She has been transfused. She is hemodynamically stable. There is no ongoing evidence of acute GI bleeding.  Her main complaint today is that of dysuria.  Patient does eat some soft solid foods orally, she does have a PEG tube in place that is not used, most of her caloric intake is by TPN. She has a short bowel from her surgery.    Past Medical History:  Past Medical History  Diagnosis Date  . COPD (chronic obstructive pulmonary disease)   . Bowel obstruction     x1 month ago    Problem List: Patient Active Problem List   Diagnosis Date Noted  . Protein-calorie malnutrition, severe 05/27/2014  . Abdominal pain 05/26/2014     Past Surgical History: History reviewed. No pertinent past surgical history.  Allergies: Allergies  Allergen Reactions  . Aspirin Itching    Home Medications: Prescriptions prior to admission  Medication Sig Dispense Refill Last Dose  . acetaminophen (TYLENOL) 160 MG/5ML solution Take 649.6 mg by mouth every 6 (six) hours as needed for mild pain.   unknown at unknown  . albuterol (ACCUNEB) 1.25 MG/3ML nebulizer solution Take 1 ampule by nebulization 3 (three) times daily as needed for wheezing.   unknown at unknown  . antiseptic oral rinse (BIOTENE) LIQD Take 5 mLs by mouth as needed for dry mouth.    unknown at unknown  . budesonide-formoterol (SYMBICORT) 160-4.5 MCG/ACT inhaler Inhale 2 puffs into the lungs 2 (two) times daily.   unknown at unknown  . clopidogrel (PLAVIX) 75 MG tablet Take 75 mg by mouth daily.   unknown at unknown  . cyanocobalamin 1000 MCG tablet Take 1,000 mcg by mouth daily.   unknown at unknown  . cycloSPORINE (RESTASIS) 0.05 % ophthalmic emulsion Place 1 drop into both eyes every 12 (twelve) hours.   unknown at unknown  . dextrose 10 % infusion Inject 70 mL/hr into the vein continuous.   unknown at unknown  . dicyclomine (BENTYL) 10 MG capsule Take 10 mg by mouth 4 (four) times daily as needed for spasms.   unknown at unknown  . ergocalciferol (DRISDOL) 8000 UNIT/ML drops Take 50,400 Units by mouth every 7 (seven) days.   unknown at unknown  . fluticasone (FLONASE) 50 MCG/ACT nasal  spray Place 2 sprays into both nostrils daily as needed for rhinitis.   unknown at unknown  . HEPARIN LOCK FLUSH IV Inject 5 mLs into the vein daily.   unknown at unknown  . loperamide (IMODIUM) 2 MG capsule Take 2 mg by mouth 4 (four) times daily as needed for diarrhea or loose stools.   unknown at unknown  . omeprazole (PRILOSEC) 20 MG capsule Take 20 mg by mouth every morning.   unknown at unknown  . ondansetron (ZOFRAN) 8 MG tablet Take 8 mg by mouth every 6 (six) hours as needed for  nausea or vomiting.   unknown at unknown  . pregabalin (LYRICA) 75 MG capsule Take 75 mg by mouth 2 (two) times daily.   unknown at unknown  . Sodium Chloride Flush (NORMAL SALINE FLUSH IV) Inject 10 mLs into the vein every 12 (twelve) hours.   unknown at unknown  . trolamine salicylate (ASPERCREME) 10 % cream Apply 1 application topically every 8 (eight) hours as needed (for lower back pain).   unknown at unknown   Home medication reconciliation was completed with the patient.   Scheduled Inpatient Medications:   . sodium chloride   Intravenous Once  . bisacodyl  10 mg Rectal Daily  . budesonide-formoterol  2 puff Inhalation BID  . clopidogrel  75 mg Oral Daily  . cycloSPORINE  1 drop Both Eyes Q12H  . fluticasone  2 spray Each Nare Daily  . free water  60 mL Per Tube 3 times per day  . insulin aspart  0-9 Units Subcutaneous 6 times per day  . magnesium sulfate 1 - 4 g bolus IVPB  2 g Intravenous Once  . pantoprazole (PROTONIX) IV  40 mg Intravenous Q12H  . piperacillin-tazobactam (ZOSYN)  IV  3.375 g Intravenous 3 times per day  . potassium phosphate IVPB (mEq)  10 mEq Intravenous Once  . pregabalin  75 mg Oral BID  . senna-docusate  1 tablet Oral BID  . sodium chloride  10-40 mL Intracatheter Q12H  . cyanocobalamin  1,000 mcg Oral Daily    Continuous Inpatient Infusions:   . Marland KitchenTPN (CLINIMIX-E) Adult 30 mL/hr at 05/29/14 1854  . Marland KitchenTPN (CLINIMIX-E) Adult    . sodium chloride 100 mL/hr at 05/30/14 0313    PRN Inpatient Medications:  acetaminophen, acetaminophen **OR** acetaminophen, alum & mag hydroxide-simeth, dicyclomine, morphine injection, ondansetron **OR** ondansetron (ZOFRAN) IV, promethazine, sodium chloride  Family History: family history is not on file.   GI Family History: Father with colon cancer, uncle with peptic ulcer disease.   Social History:   reports that she has been smoking Cigarettes.  She has been smoking about 1.00 pack per day. She does not have any  smokeless tobacco history on file. She reports that she does not drink alcohol or use illicit drugs. The patient denies ETOH, tobacco, or drug use. She quit using these substances including cocaine and marijuana as well as alcohol about 5 or 6 months ago.  ROS  Review of Systems: Per admission history and physical agree with same Physical Examination: BP 161/63 mmHg  Pulse 102  Temp(Src) 100.1 F (37.8 C) (Oral)  Resp 20  Ht '5\' 6"'$  (1.676 m)  Wt 40.325 kg (88 lb 14.4 oz)  BMI 14.36 kg/m2  SpO2 100%  LMP  Gen: Thin, alert and oriented no acute distress. HEENT: Normocephalic atraumatic eyes are anicteric Neck: No JVD Chest: Clear to auscultation CV: Regular rate and rhythm without rub or gallop Abd: Soft bowel  tenderness across the lower abdomen. It is a PEG tube in place in the left upper quadrant. It is no inflammation around this. There is a midline incision, this reflective of her recent surgical history. There are no apparent masses. Ext: No clubbing cyanosis or edema Skin: Other:  Data: Lab Results  Component Value Date   WBC 13.4* 05/30/2014   HGB 8.9* 05/30/2014   HGB 8.9* 05/30/2014   HCT 26.9* 05/30/2014   HCT 27.1* 05/30/2014   MCV 93.6 05/30/2014   PLT 302 05/30/2014    Recent Labs Lab 05/29/14 1152 05/30/14 0300 05/30/14 0648  HGB 7.2* QUESTIONABLE IDENTIFICATION / INCORRECTLY LABELED SPECIMEN 8.9*  8.9*   Lab Results  Component Value Date   NA 141 05/30/2014   K 3.1* 05/30/2014   CL 107 05/30/2014   CO2 25 05/30/2014   BUN 7 05/30/2014   CREATININE 0.34* 05/30/2014   Lab Results  Component Value Date   ALT 33 05/26/2014   AST 24 05/26/2014   ALKPHOS 78 05/26/2014   BILITOT 0.3 05/26/2014   No results for input(s): APTT, INR, PTT in the last 168 hours.  STUDIES: Dg Abd 1 View  05/29/2014   CLINICAL DATA:  Abdominal tenderness, diarrhea, history of bowel obstruction with surgery  EXAM: ABDOMEN - 1 VIEW  COMPARISON:  CT abdomen pelvis dated  05/26/2014  FINDINGS: Nonobstructive bowel gas pattern.  Gastrostomy tube overlies stomach.  Tubal ligation clips overlying the pelvis.  IMPRESSION: No evidence of bowel obstruction.  Gastrostomy tube overlies the stomach.   Electronically Signed   By: Julian Hy M.D.   On: 05/29/2014 11:13    Assessment/Plan: GI bleeding status post extensive gastrointestinal surgery currently with short bowel syndrome, this secondary to intestinal ischemia. Recent surgery at [facility] as noted above. She is currently hemodynamically stable with no evidence of ongoing bleeding with her in the globin stable as well status post transfusion of one unit of packed cells. He is currently on IV proton pump inhibitor twice daily.  I called and talked with her surgeon at [facility] Dr. Cathie Olden. He has stated that he felt that if we needed to do luminal evaluation urgently this would be acceptable with her current status. However he agreed on holding off if there was no ongoing bleeding. She is currently on a list for transfer back to [facility]. Recommend serial hemoglobin and transfuse as needed, if there is evidence of repeat bleeding would recommend EGD. Continue IV PPI. Would recommend changing diet orally active to clear liquids for now. We'll follow with you  Recommendations: As above  Thank you for the consult. Please call with questions or concerns.  Lollie Sails, MD  05/30/2014 1:32 PM

## 2014-05-30 NOTE — Plan of Care (Signed)
Problem: Discharge Progression Outcomes Goal: Discharge plan in place and appropriate Individualization of Care:  1. Like to be called [name] [name]. 2. Currently living at [facility] for rehab. 3. In a substance abuse program. 4. Hx of bowel obstruction and COPD, controlled by home medications. Goal: Other Discharge Outcomes/Goals Plan of care progress to goal:  Patient c/o lower abd pain once, IV Morphine given with relief. No active signs of bleeding. 1 unit of pRBCs infused for Hemoglobin of 7.2 - tolerated well. Hemoglobin to be drawn this AM. TPN infusing at 8m/hr.

## 2014-05-30 NOTE — Progress Notes (Signed)
Physical Therapy Treatment Patient Details Name: TEARSA KOWALEWSKI MRN: [id number] DOB: [birth year] Today's Date: 05/30/2014    History of Present Illness Pt is a slender 59yo black female who presents with malaise and abdominal pain. Pt was admitted at [facility] last month for 27 days, where she underwent abdominal GI surgery. Pt DC to [place] for rehab for general strengthening, and arrived at [facility] after 10 days with additional abdominal pain.  Pt continues to show progress toward goals as evidenced by increased distance and speed of ambulation, as well as decreased c/o pain, and absence of flat affect. Pt greatest impairments remain strength and and activity tolerance, limiting indep in IADL and AMB. Pt will continue to benefit from skilled intervention to restore to PLOF.   PT Comments      Follow Up Recommendations  Home health PT;Supervision - Intermittent     Equipment Recommendations       Recommendations for Other Services       Precautions / Restrictions Precautions Precautions: Fall Restrictions Weight Bearing Restrictions: No    Mobility  Bed Mobility Overal bed mobility: Modified Independent                Transfers Overall transfer level: Modified independent Equipment used: Straight cane                Ambulation/Gait Ambulation/Gait assistance: Supervision Ambulation Distance (Feet): 900 Feet Assistive device: Straight cane Gait Pattern/deviations: WFL(Within Functional Limits) Gait velocity: 2.66 ft/sec Gait velocity interpretation: >2.62 ft/sec, indicative of independent community ambulator General Gait Details: Tolerating amb well, expressing feeling better getting out of bed.    Stairs            Wheelchair Mobility    Modified Rankin (Stroke Patients Only)       Balance Overall balance assessment: Modified Independent                                  Cognition Arousal/Alertness: Awake/alert Behavior  During Therapy: WFL for tasks assessed/performed Overall Cognitive Status: Within Functional Limits for tasks assessed                      Exercises      General Comments        Pertinent Vitals/Pain Pain Assessment: No/denies pain Pain Intervention(s): Premedicated before session;Monitored during session    Home Living                      Prior Function            PT Goals (current goals can now be found in the care plan section) Acute Rehab PT Goals Patient Stated Goal: DC to home with help from brother  PT Goal Formulation: With patient Time For Goal Achievement: 06/12/14 Potential to Achieve Goals: Good Progress towards PT goals: Progressing toward goals    Frequency  Min 2X/week    PT Plan Current plan remains appropriate    Co-evaluation             End of Session Equipment Utilized During Treatment: Gait belt Activity Tolerance: Patient tolerated treatment well;No increased pain Patient left: in bed;with call bell/phone within reach;with bed alarm set     Time: 8563-1497 PT Time Calculation (min) (ACUTE ONLY): 16 min  Charges:  $Therapeutic Activity: 8-22 mins  G Codes:      Arin Peral C 2014/06/16, 9:18 AM  Etta Grandchild, PT, DPT, BM

## 2014-05-30 NOTE — Progress Notes (Signed)
PARENTERAL NUTRITION CONSULT NOTE - FOLLOW UP  Pharmacy Consult for TPN Indication: protein-calorie malnutrition  Allergies  Allergen Reactions   Aspirin Itching    Patient Measurements: Height: '5\' 6"'$  (167.6 cm) Weight: 88 lb 14.4 oz (40.325 kg) IBW/kg (Calculated) : 59.3   Vital Signs: Temp: 99.2 F (37.3 C) (05/06 0535) Temp Source: Oral (05/06 0535) BP: 161/66 mmHg (05/06 0535) Pulse Rate: 105 (05/06 0535) Intake/Output from previous day: 05/05 0701 - 05/06 0700 In: 3036.5 [P.O.:480; I.V.:1790; Blood:325; NG/GT:40; IV Piggyback:50; TPN:351.5] Out: 501 [Urine:500; Stool:1] Intake/Output from this shift:    Labs:  Recent Labs  05/28/14 0601 05/29/14 0540 05/29/14 1152 05/30/14 0300 05/30/14 0648  WBC 15.2* 19.6* 19.4*  --   --   HGB 9.4* 7.5* 7.2* QUESTIONABLE IDENTIFICATION / INCORRECTLY LABELED SPECIMEN 8.9*  HCT 28.5* 23.4* 23.1* QUESTIONABLE IDENTIFICATION / INCORRECTLY LABELED SPECIMEN 26.9*  PLT 376 324 323  --   --      Recent Labs  05/29/14 1559 05/30/14 0300 05/30/14 0648  NA 136  --   --   K 3.0*  --   --   CL 106  --   --   CO2 25  --   --   GLUCOSE 75  --   --   BUN 7  --   --   CREATININE 0.34*  --   --   CALCIUM 7.9*  --   --   MG 1.5*  --   --   PHOS  --  2.2*  --   TRIG  --   --  107   Estimated Creatinine Clearance: 49.4 mL/min (by C-G formula based on Cr of 0.34).    Recent Labs  05/30/14 0509 05/30/14 0600 05/30/14 0621  GLUCAP 125* 131* 127*    Medications:  Zosyn 3.375 g q8h for intra-abdominal infection NS at 100 mL/h Cyanocobalamin 1000 mcg PO daily Pantoprazole 40 mg PO daily  Insulin Requirements in the past 24 hours: 1 unit  Current Nutrition: Clinimix 5/20 with MVI and trace at 30 mL/hr over 24 hours, 20% lipids twice weekly, Ensure Enlive supplementation, regular diet  Assessment: electrolytes require supplementation  Nutritional Goals:  1703 - 2012 kCal, 71 - 89 grams of protein per day (based on IBW  of 59 kg)  Plan:  Supplement 10 mEq potassium phosphate and 2 g magnesium IV with follow-up labs 5/7 AM.  [name] [name] 05/30/2014,9:03 AM

## 2014-05-31 LAB — GLUCOSE, CAPILLARY
Glucose-Capillary: 109 mg/dL — ABNORMAL HIGH (ref 70–99)
Glucose-Capillary: 113 mg/dL — ABNORMAL HIGH (ref 70–99)
Glucose-Capillary: 139 mg/dL — ABNORMAL HIGH (ref 70–99)
Glucose-Capillary: 141 mg/dL — ABNORMAL HIGH (ref 70–99)
Glucose-Capillary: 144 mg/dL — ABNORMAL HIGH (ref 70–99)
Glucose-Capillary: 159 mg/dL — ABNORMAL HIGH (ref 70–99)

## 2014-05-31 LAB — PHOSPHORUS: Phosphorus: 3 mg/dL (ref 2.5–4.6)

## 2014-05-31 LAB — MAGNESIUM: Magnesium: 1.9 mg/dL (ref 1.7–2.4)

## 2014-05-31 MED ORDER — FREE WATER
60.0000 mL | Freq: Three times a day (TID) | Status: DC
  Administered 2014-05-31 – 2014-06-02 (×7): 60 mL

## 2014-05-31 MED ORDER — BOOST / RESOURCE BREEZE PO LIQD
1.0000 | Freq: Two times a day (BID) | ORAL | Status: DC
  Administered 2014-05-31 – 2014-06-02 (×3): 1 via ORAL

## 2014-05-31 MED ORDER — TRACE MINERALS CR-CU-MN-SE-ZN 10-1000-500-60 MCG/ML IV SOLN
INTRAVENOUS | Status: AC
  Administered 2014-05-31: 19:00:00 via INTRAVENOUS
  Filled 2014-05-31: qty 1320

## 2014-05-31 NOTE — Plan of Care (Addendum)
Problem: Discharge Progression Outcomes Goal: Other Discharge Outcomes/Goals Outcome: Not Progressing Pt c/o of severe lower abd pain, morphine given with good effect. No n/v. TPN infusing, poor intake.

## 2014-05-31 NOTE — Progress Notes (Addendum)
Nutrition Follow-up  DOCUMENTATION CODES:  Severe malnutrition in context of chronic illness  INTERVENTION:  PN: Recommend continuing TPN to goal rate today of 4m/hr. Writer notes MD Sainani discontinued additional IVF. Medical Nutrition Supplement: Ensure Enlive d/c as pt does not like.  Will send Boost Breeze BID for pt to try as pt on CLdiet order. Meals and snack: Cater to pt preferences on CL diet order.  NUTRITION DIAGNOSIS:  Impaired nutrient utilization related to altered GI function as evidenced by moderate depletions of muscle mass, severe depletion of muscle mass, ongoing  GOAL:  Patient will meet greater than or equal to 90% of their needs via PN and po intake, goal for diet advancement back to solids as medically able  MONITOR:  Energy Intake PN Electrolyte and renal profile Glucose profile  ASSESSMENT:  [facility] Having abdominal pain, diet order downgraded.  Medicatiosn: reviewed  Labs: Electrolyte and Renal Profile:    Recent Labs Lab 05/27/14 0458 05/29/14 1559 05/30/14 0300 05/30/14 0648 05/31/14 0624  BUN 8 7  --  7  --   CREATININE <0.30* 0.34*  --  0.34*  --   NA 138 136  --  141  --   K 3.6 3.0*  --  3.1*  --   MG  --  1.5*  --  1.5* 1.9  PHOS  --   --  2.2*  --  3.0   Glucose Profile:  Recent Labs  05/31/14 0532 05/31/14 0753 05/31/14 1110  GLUCAP 159* 113* 144*    Height:  Ht Readings from Last 1 Encounters:  05/26/14 '5\' 6"'$  (1.676 m)    Weight:  Wt Readings from Last 1 Encounters:  05/31/14 90 lb 4.8 oz (40.96 kg)    Wt Readings from Last 10 Encounters:  05/31/14 90 lb 4.8 oz (40.96 kg)    BMI:  Body mass index is 14.58 kg/(m^2).  Estimated Nutritional Needs:  Kcal:  13568-6168kcals/d BEE 1191 kcals (IF 1.1-1.3, AF 1.3) Using IBW of 59kg  Protein:  71-89 gm/d (1.2-1.5 gm/kg) Using IBW of 59kg  Fluid:  1475-17773md (25-3074mg) Using IBW of 59kg  Skin:  Reviewed, no issues  Diet Order:   .TPN (CLINIMIX-E) Adult Diet clear liquid Room service appropriate?: Yes; Fluid consistency:: Thin; Fluid restriction:: Other (see comments) .TPN (CLINIMIX-E) Adult  EDUCATION NEEDS:  No education needs identified at this time   Intake/Output Summary (Last 24 hours) at 05/31/14 1307 Last data filed at 05/31/14 1119  Gross per 24 hour  Intake    240 ml  Output    700 ml  Net   -460 ml    Last BM:  Loose stool this am  HIGH Care Level  AllDwyane LuoD, LDN Pager [phone]

## 2014-05-31 NOTE — Consult Note (Signed)
Subjective: Patient seen for GI bleeding. Presentation today patient denies any emesis although she has had some nausea today well as intermittent abdominal pain. He has had no melena. She states that the intermittent abdominal pain was worse when she tried taking some clear liquids.  Objective: Vital signs in last 24 hours: Temp:  [98.3 F (36.8 C)-101.3 F (38.5 C)] 98.8 F (37.1 C) (05/07 1700) Pulse Rate:  [79-100] 100 (05/07 1421) Resp:  [20] 20 (05/07 1421) BP: (123-143)/(39-52) 137/52 mmHg (05/07 1421) SpO2:  [100 %] 100 % (05/07 1421) Weight:  [40.96 kg (90 lb 4.8 oz)] 40.96 kg (90 lb 4.8 oz) (05/07 0727) Blood pressure 137/52, pulse 100, temperature 98.8 F (37.1 C), temperature source Oral, resp. rate 20, height '5\' 6"'$  (1.676 m), weight 40.96 kg (90 lb 4.8 oz), SpO2 100 %.   Intake/Output from previous day: 05/06 0701 - 05/07 0700 In: 300 [P.O.:240] Out: 300 [Urine:300]  Intake/Output this shift: Total I/O In: 1000 [Other:120; NG/GT:120; IV Piggyback:100; TPN:660] Out: 650 [Urine:650]   General appearance:  Thin, no acute distress Resp:  Clear to auscultation Cardio:  Regular rate and rhythm GI:  Tender to palpation throughout the lower abdomen, less so in the upper abdomen. No masses or rebound. Extremities:  No clubbing cyanosis or edema   Lab Results: Results for orders placed or performed during the hospital encounter of 05/26/14 (from the past 24 hour(s))  Glucose, capillary     Status: Abnormal   Collection Time: 05/30/14  8:58 PM  Result Value Ref Range   Glucose-Capillary 159 (H) 70 - 99 mg/dL   Comment 1 Notify RN   Glucose, capillary     Status: Abnormal   Collection Time: 05/31/14 12:06 AM  Result Value Ref Range   Glucose-Capillary 141 (H) 70 - 99 mg/dL   Comment 1 Notify RN   Glucose, capillary     Status: Abnormal   Collection Time: 05/31/14  5:32 AM  Result Value Ref Range   Glucose-Capillary 159 (H) 70 - 99 mg/dL  Magnesium     Status: None    Collection Time: 05/31/14  6:24 AM  Result Value Ref Range   Magnesium 1.9 1.7 - 2.4 mg/dL  Phosphorus     Status: None   Collection Time: 05/31/14  6:24 AM  Result Value Ref Range   Phosphorus 3.0 2.5 - 4.6 mg/dL  Glucose, capillary     Status: Abnormal   Collection Time: 05/31/14  7:53 AM  Result Value Ref Range   Glucose-Capillary 113 (H) 70 - 99 mg/dL  Glucose, capillary     Status: Abnormal   Collection Time: 05/31/14 11:10 AM  Result Value Ref Range   Glucose-Capillary 144 (H) 70 - 99 mg/dL  Glucose, capillary     Status: Abnormal   Collection Time: 05/31/14  4:31 PM  Result Value Ref Range   Glucose-Capillary 139 (H) 70 - 99 mg/dL      Recent Labs  05/29/14 0540 05/29/14 1152 05/30/14 0300 05/30/14 0648  WBC 19.6* 19.4*  --  13.4*  HGB 7.5* 7.2* QUESTIONABLE IDENTIFICATION / INCORRECTLY LABELED SPECIMEN 8.9*  8.9*  HCT 23.4* 23.1* QUESTIONABLE IDENTIFICATION / INCORRECTLY LABELED SPECIMEN 27.1*  26.9*  PLT 324 323  --  302   BMET  Recent Labs  05/29/14 1559 05/30/14 0648  NA 136 141  K 3.0* 3.1*  CL 106 107  CO2 25 25  GLUCOSE 75 137*  BUN 7 7  CREATININE 0.34* 0.34*  CALCIUM 7.9* 8.0*  LFT No results for input(s): PROT, ALBUMIN, AST, ALT, ALKPHOS, BILITOT, BILIDIR, IBILI in the last 72 hours. PT/INR No results for input(s): LABPROT, INR in the last 72 hours. Hepatitis Panel No results for input(s): HEPBSAG, HCVAB, HEPAIGM, HEPBIGM in the last 72 hours. C-Diff No results for input(s): CDIFFTOX in the last 72 hours. No results for input(s): CDIFFPCR in the last 72 hours.   Studies/Results: No results found.  Scheduled Inpatient Medications:   . sodium chloride   Intravenous Once  . bisacodyl  10 mg Rectal Daily  . budesonide-formoterol  2 puff Inhalation BID  . clopidogrel  75 mg Oral Daily  . cycloSPORINE  1 drop Both Eyes Q12H  . feeding supplement (RESOURCE BREEZE)  1 Container Oral BID WC  . fluticasone  2 spray Each Nare Daily  .  free water  60 mL Per Tube 3 times per day  . insulin aspart  0-9 Units Subcutaneous 6 times per day  . pantoprazole (PROTONIX) IV  40 mg Intravenous Q12H  . piperacillin-tazobactam (ZOSYN)  IV  3.375 g Intravenous 3 times per day  . pregabalin  75 mg Oral BID  . senna-docusate  1 tablet Oral BID  . sodium chloride  10-40 mL Intracatheter Q12H  . cyanocobalamin  1,000 mcg Oral Daily    Continuous Inpatient Infusions:   . Marland KitchenTPN (CLINIMIX-E) Adult      PRN Inpatient Medications:  acetaminophen, acetaminophen **OR** acetaminophen, alum & mag hydroxide-simeth, dicyclomine, morphine injection, ondansetron **OR** ondansetron (ZOFRAN) IV, promethazine, sodium chloride  Miscellaneous:   Assessment:  1. GI bleeding in the setting of subtotal small intestine removal due to ischemia. Short Bowel syndrome. She has been hemodynamically stable and has shown no evidence of recurrent melena for 2 days. Currently on IV PPI. Some increased abdominal discomfort today after eating.  Plan:  1. continue IV PPI as you are 2. Awaiting transfer to [facility], currently delayed due to bed availability 3. Hold by mouth diet at clear liquids, no red, for now. patient seems most of her caloric intake from TPN. 4. Discussed with Dr. Verdell Carmine.  Lollie Sails MD 05/31/2014, 6:20 PM

## 2014-05-31 NOTE — Plan of Care (Addendum)
Problem: Discharge Progression Outcomes Goal: Other Discharge Outcomes/Goals Outcome: Progressing Pt remains on clear liquid diet, no emesis, no c/o nausea, tolerating diet. Pt is up to bsc with stand by assist, pt continues on room air, TPN infusing, IV fluids d/c, receiving IV antibiotics, fever throughout shift, administered tylenol. C.o pain improved with morphine. Dr. Verdell Carmine and Dr. Gustavo Lah confirmed that pt is still appropriate for transfer to [facility], Dr. Verdell Carmine contacted [facility] via telephone and pt is still on list. Bm throughtou shift.

## 2014-05-31 NOTE — Progress Notes (Signed)
[place] at [place] NAME: [name] [name]    MR#:  [id number]  DATE OF BIRTH:  1955/03/22  SUBJECTIVE:  Abdominal pain improving. No more bloody stools. No vomiting. Tolerating FLD  REVIEW OF SYSTEMS:   Review of Systems  Constitutional: Negative for fever and chills.  HENT: Negative for tinnitus.   Respiratory: Negative for cough and shortness of breath.   Cardiovascular: Negative for chest pain, orthopnea and PND.  Gastrointestinal: Positive for abdominal pain. Negative for nausea, vomiting and diarrhea.  Genitourinary: Negative for dysuria and hematuria.  Neurological: Negative for weakness.  All other systems reviewed and are negative.  Nutrition: come clear liquids and TPN. Has PEG tube but not needing G tube feeling Tolerating PT: await PT eval.  Tolerating diet: yes clear liquids  DRUG ALLERGIES:   Allergies  Allergen Reactions  . Aspirin Itching    VITALS:  Blood pressure 137/52, pulse 100, temperature 101.3 F (38.5 C), temperature source Oral, resp. rate 20, height '5\' 6"'$  (1.676 m), weight 40.96 kg (90 lb 4.8 oz), SpO2 100 %.  PHYSICAL EXAMINATION:  GENERAL:  59 y.o.-year-old cachectic, thin patient lying in the bed in mild distress due to abdominal pain.  EYES: Pupils equal, round, reactive to light and accommodation. No scleral icterus. Extraocular muscles intact.  HEENT: Head atraumatic, normocephalic. No oropharyngeal erythema, Moist oral mucosa.  NECK:  Supple, no jugular venous distention. No thyroid enlargement, no tenderness.  LUNGS: Normal breath sounds bilaterally, no wheezing, rales, rhonchi. No use of accessory muscles of respiration.  CARDIOVASCULAR: S1, S2 normal. No murmurs, rubs, or gallops.  ABDOMEN: Soft, tender diffusely. No rebound rigidity. nondistended. Bowel sounds present. No organomegaly or mass.  EXTREMITIES: No pedal edema, cyanosis, or clubbing.  NEUROLOGIC: Cranial nerves II through XII  are intact. No focal Motor or sensory defecits b/l PSYCHIATRIC: The patient is alert and oriented x 3.  SKIN: No obvious rash, lesion, or ulcer.    LABORATORY PANEL:   CBC  Recent Labs Lab 05/29/14 1152 05/30/14 0300 05/30/14 0648  WBC 19.4*  --  13.4*  HGB 7.2* QUESTIONABLE IDENTIFICATION / INCORRECTLY LABELED SPECIMEN 8.9*  8.9*  HCT 23.1* QUESTIONABLE IDENTIFICATION / INCORRECTLY LABELED SPECIMEN 27.1*  26.9*  PLT 323  --  302    RADIOLOGY:  No results found. ASSESSMENT AND PLAN:   * Generalized Abdominal pain - etiology unclear - CT abd/pelvis on admission showing no acute pathology.  - recently underwent exploratory laparotomy at [facility] for Chronic mesenteric ischemia. Still continues to have abdominal pain.  Etiology unclear.  - cont. prn morphine for pain - pt and family requesting transfer to [facility].  Dr. Fritzi Mandes Spoke with Dr Nettie Elm on 05/30/14. Accepted pt. Await bed availability and transfer - Surgical consult with Dr mark Marina Gravel noted - cont. Supportive care.   *Acute gastritis with bloody/dark stools and drop in hgb 9.2-->7.2 - s/p BT 1 unit on 5/5 Hg. Improved.  - cont. IV PPI BID. Appreciate GI input.   * Fever/Sepsis - source unclear ?? GI (vs) UTI.  - has a fever of 101 today.  Will get BC.  - cont. IV zosyn and follow fever curve.  - WBC count trending down15.2--.19.6-->19.4-->13.4   * Protein-calorie malnutrition, severe - cont. TPN as per dietary.  Cont. Clear liquid diet.    * COPD: no acute exacerbation. Cont. Symbicort.     All the records are reviewed and case discussed with Care Management/Social Workerr. Management plans  discussed with the patient, family and they are in agreement.  CODE STATUS: full code  TOTAL TIME TAKING CARE OF THIS PATIENT: 30 mins.   Pending transfer to [place] - called Transfer center and she is on the waiting list.    Henreitta Leber M.D on 05/31/2014 at 2:41 PM  Between 7am to 6pm - Pager -  [phone] After 6pm go to [email or web address] - password [email or web address] [place] [facility]  Office  [phone]  CC: Primary care physician; Cletis Athens, MD

## 2014-05-31 NOTE — Progress Notes (Signed)
At this time per Dr Verdell Carmine, [name] [name] is being transferred to [facility] as soon as a bed is available.

## 2014-06-01 LAB — CBC
HCT: 26.4 % — ABNORMAL LOW (ref 35.0–47.0)
Hemoglobin: 8.5 g/dL — ABNORMAL LOW (ref 12.0–16.0)
MCH: 30.6 pg (ref 26.0–34.0)
MCHC: 32.4 g/dL (ref 32.0–36.0)
MCV: 94.6 fL (ref 80.0–100.0)
Platelets: 338 10*3/uL (ref 150–440)
RBC: 2.79 MIL/uL — ABNORMAL LOW (ref 3.80–5.20)
RDW: 15 % — ABNORMAL HIGH (ref 11.5–14.5)
WBC: 7.6 10*3/uL (ref 3.6–11.0)

## 2014-06-01 LAB — GLUCOSE, CAPILLARY
Glucose-Capillary: 119 mg/dL — ABNORMAL HIGH (ref 70–99)
Glucose-Capillary: 121 mg/dL — ABNORMAL HIGH (ref 70–99)
Glucose-Capillary: 122 mg/dL — ABNORMAL HIGH (ref 70–99)
Glucose-Capillary: 144 mg/dL — ABNORMAL HIGH (ref 70–99)
Glucose-Capillary: 154 mg/dL — ABNORMAL HIGH (ref 70–99)
Glucose-Capillary: 158 mg/dL — ABNORMAL HIGH (ref 70–99)

## 2014-06-01 LAB — TYPE AND SCREEN
ABO/RH(D): O NEG
Antibody Screen: NEGATIVE
Unit division: 0

## 2014-06-01 LAB — BASIC METABOLIC PANEL
Anion gap: 5 (ref 5–15)
BUN: 6 mg/dL (ref 6–20)
CO2: 31 mmol/L (ref 22–32)
Calcium: 7.8 mg/dL — ABNORMAL LOW (ref 8.9–10.3)
Chloride: 101 mmol/L (ref 101–111)
Creatinine, Ser: 0.38 mg/dL — ABNORMAL LOW (ref 0.44–1.00)
GFR calc Af Amer: >60 mL/min (ref >60)
GFR calc non Af Amer: >60 mL/min (ref >60)
Glucose, Bld: 93 mg/dL (ref 65–99)
Potassium: 2.8 mmol/L — CL (ref 3.5–5.1)
Sodium: 137 mmol/L (ref 135–145)

## 2014-06-01 LAB — ABO/RH: ABO/RH(D): O NEG

## 2014-06-01 MED ORDER — LEVOFLOXACIN 500 MG PO TABS
500.0000 mg | ORAL_TABLET | Freq: Every day | ORAL | Status: DC
  Administered 2014-06-01 – 2014-06-02 (×2): 500 mg via ORAL
  Filled 2014-06-01 (×2): qty 1

## 2014-06-01 MED ORDER — POTASSIUM CHLORIDE CRYS ER 20 MEQ PO TBCR
20.0000 meq | EXTENDED_RELEASE_TABLET | Freq: Two times a day (BID) | ORAL | Status: DC
  Administered 2014-06-01 – 2014-06-02 (×3): 20 meq via ORAL
  Filled 2014-06-01 (×3): qty 1

## 2014-06-01 MED ORDER — M.V.I. ADULT IV INJ
INJECTION | INTRAVENOUS | Status: DC
  Administered 2014-06-01: 18:00:00 via INTRAVENOUS
  Filled 2014-06-01: qty 1320

## 2014-06-01 NOTE — Progress Notes (Signed)
Lab previously called the floor at 1356 to advise of critical lab value K+ 2.8 (called to E. Janalyn Harder RN); Dr Verdell Carmine on the floor, verbally advised him of critical lab value, Dr Verdell Carmine previously entered orders in [facility]

## 2014-06-01 NOTE — Plan of Care (Signed)
Problem: Discharge Progression Outcomes Goal: Other Discharge Outcomes/Goals Outcome: Not Progressing Pt remains on TPN; diet advanced to full liquid this shift; denies pain; still awaiting transfer to [facility] hospital when bed becomes available

## 2014-06-01 NOTE — Progress Notes (Signed)
[place] at [place] NAME: [name] [name]    MR#:  [id number]  DATE OF BIRTH:  09/01/1955  SUBJECTIVE:   Abdominal pain much improved.  No N/V. Tolerating Full liquid diet well.  Afebrile.    REVIEW OF SYSTEMS:   Review of Systems  Constitutional: Negative for fever and chills.  HENT: Negative for tinnitus.   Respiratory: Negative for cough and shortness of breath.   Cardiovascular: Negative for chest pain, orthopnea and PND.  Gastrointestinal: Positive for abdominal pain (much improved.  ) and diarrhea (loose stools). Negative for nausea and vomiting.  Genitourinary: Negative for dysuria and hematuria.  All other systems reviewed and are negative.  Nutrition: Full liquis and TPN. Has PEG tube but not needing G tube feeling Tolerating PT: await PT eval.  Tolerating diet: yes clear liquids  DRUG ALLERGIES:   Allergies  Allergen Reactions  . Aspirin Itching    VITALS:  Blood pressure 145/62, pulse 91, temperature 99.5 F (37.5 C), temperature source Oral, resp. rate 20, height '5\' 6"'$  (1.676 m), weight 40.88 kg (90 lb 2 oz), SpO2 100 %.  PHYSICAL EXAMINATION:  GENERAL:  59 y.o.-year-old cachectic, thin patient lying in the bed in NAD EYES: Pupils equal, round, reactive to light and accommodation. No scleral icterus. Extraocular muscles intact.  HEENT: Head atraumatic, normocephalic. No oropharyngeal erythema, Moist oral mucosa.  NECK:  Supple, no jugular venous distention. No thyroid enlargement, no tenderness.  LUNGS: Normal breath sounds bilaterally, no wheezing, rales, rhonchi. No use of accessory muscles of respiration.  CARDIOVASCULAR: S1, S2 normal. No murmurs, No rubs, or gallops.  ABDOMEN: Soft, Non-tender,  nondistended. Bowel sounds present. No organomegaly or mass.  EXTREMITIES: No pedal edema, cyanosis, or clubbing.  NEUROLOGIC: Cranial nerves II through XII are intact. No focal Motor or sensory defecits  b/l PSYCHIATRIC: The patient is alert and oriented x 3.  SKIN: No obvious rash, lesion, or ulcer.    LABORATORY PANEL:   CBC  Recent Labs Lab 05/30/14 0648 06/01/14 0527  WBC 13.4* 7.6  HGB 8.9*  8.9* 8.5*  HCT 27.1*  26.9* 26.4*  PLT 302 338    RADIOLOGY:  No results found. ASSESSMENT AND PLAN:   * Generalized Abdominal pain - etiology unclear but much  improved. ?? Related to Gastritis  - CT abd/pelvis on admission showing no acute pathology.  - recently underwent exploratory laparotomy at [facility] for Chronic mesenteric ischemia.  - cont. prn morphine for pain - Pt. Is improving and was on transfer list to [facility] but probably doesn't need it any more as she is improving.   - discussed w/ GI and will advanced diet to full liquids today and then to low residue in a.m.  - cont. Supportive care.   *Acute gastritis with bloody/dark stools and drop in hgb 9.2-->7.2 - s/p BT 1 unit on 5/5 Hg. Improved and stable at 8.5 today.  - on IV PPI BID and will switch to oral tomorrow. Appreciate GI input.   * Fever/Sepsis - source unclear ?? GI (vs) UTI.  - afebrile for the past 24 hrs.  BC (-) so far.   - d/c IV zosyn and will switch to Oral Levaquin today.  - WBC count trending down15.2--.19.6-->19.4-->13.4--> 7.6 today.   * Protein-calorie malnutrition, severe - cont. TPN as per dietary.  Advance to full liquid diet today and monitor.   * Hypokalemia - likely due to diarrhea  - will place on supplements and  repeat in a.m. Tomorrow.    * COPD: no acute exacerbation. Cont. Symbicort.     All the records are reviewed and case discussed with Care Management/Social Workerr. Management plans discussed with the patient, family and they are in agreement.  CODE STATUS: full code  TOTAL TIME TAKING CARE OF THIS PATIENT: 30 mins.   Pending transfer to [place] - called Transfer center and she is on the waiting list.    Henreitta Leber M.D on 06/01/2014 at 2:53  PM  Between 7am to 6pm - Pager - [phone] After 6pm go to [email or web address] - password [email or web address] [place] [facility]  Office  [phone]  CC: Primary care physician; Cletis Athens, MD

## 2014-06-01 NOTE — Plan of Care (Signed)
Problem: Discharge Progression Outcomes Goal: Discharge plan in place and appropriate Individualization of Care:   Outcome: Progressing 1. Like to be called [name] [name] 2. Currently living at [facility] for rehab 3. In a substance abuse program 4. Hx of bowel obstruction and COPD, controlled by home medications Goal: Other Discharge Outcomes/Goals Outcome: Progressing Plan of Care Progress to Goal: Afebrile. Morphine given once for lower abd pain with improvement. Continue on clear liquid diet. No signs of bleeding. No c/o n/v. Continue TPN.

## 2014-06-01 NOTE — Progress Notes (Signed)
PARENTERAL NUTRITION CONSULT NOTE - FOLLOW UP  Pharmacy Consult for TPN Indication: protein-calorie malnutrition  Allergies  Allergen Reactions  . Aspirin Itching    Patient Measurements: Height: '5\' 6"'$  (167.6 cm) Weight: 90 lb 2 oz (40.88 kg) IBW/kg (Calculated) : 59.3   Vital Signs: Temp: 98.8 F (37.1 C) (05/08 0549) Temp Source: Oral (05/08 0549) BP: 135/55 mmHg (05/08 0549) Pulse Rate: 86 (05/08 0549) Intake/Output from previous day: 05/07 0701 - 05/08 0700 In: 1715.8 [NG/GT:120; IV Piggyback:100; TPN:1375.8] Out: 875 [Urine:875] Intake/Output from this shift: Total I/O In: 500 [P.O.:480; I.V.:20] Out: 300 [Urine:300]  Labs:  Recent Labs  05/29/14 1152 05/30/14 0300 05/30/14 0648 06/01/14 0527  WBC 19.4*  --  13.4* 7.6  HGB 7.2* QUESTIONABLE IDENTIFICATION / INCORRECTLY LABELED SPECIMEN 8.9*  8.9* 8.5*  HCT 23.1* QUESTIONABLE IDENTIFICATION / INCORRECTLY LABELED SPECIMEN 27.1*  26.9* 26.4*  PLT 323  --  302 338     Recent Labs  05/29/14 1559 05/30/14 0300 05/30/14 0648 05/31/14 0624  NA 136  --  141  --   K 3.0*  --  3.1*  --   CL 106  --  107  --   CO2 25  --  25  --   GLUCOSE 75  --  137*  --   BUN 7  --  7  --   CREATININE 0.34*  --  0.34*  --   CALCIUM 7.9*  --  8.0*  --   MG 1.5*  --  1.5* 1.9  PHOS  --  2.2*  --  3.0  PREALBUMIN  --   --  5.8*  --   TRIG  --   --  107  --    Estimated Creatinine Clearance: 50.1 mL/min (by C-G formula based on Cr of 0.34).    Recent Labs  06/01/14 0043 06/01/14 0426 06/01/14 0736  GLUCAP 158* 154* 122*    Medications:  Zosyn 3.375 g q8h for intra-abdominal infection Cyanocobalamin 1033mg PO QD  Pantoprazole 40 mg IV Q12H  Insulin Requirements in the past 24 hours: 3 unit  Current Nutrition: Clinimix 5/20 with MVI and trace at 30 mL/hr over 24 hours, 20% lipids twice weekly, Resoure Breeze supplementation, regular diet  Assessment:    Nutritional Goals:  1703 - 2012 kCal, 71 - 89  grams of protein per day (based on IBW of 59 kg)  Plan:  Continue TPN as ordered.  Recheck labs on 5/9.    Dayvin Aber K 06/01/2014,11:11 AM

## 2014-06-01 NOTE — Progress Notes (Signed)
Subjective: Patient seen for GI bleeding in the setting of subtotal small bowel resection due to ischemia. Patient has been doing well. There's been no evidence of repeat bleeding for several days. Her abdominal pain is much improved. She is continuing on IV PPI. She is currently tolerating clear liquids. He denies any abdominal pain with eating currently.  Objective: Vital signs in last 24 hours: Temp:  [98.3 F (36.8 C)-99.5 F (37.5 C)] 99.5 F (37.5 C) (05/08 1404) Pulse Rate:  [86-100] 91 (05/08 1404) Resp:  [16-20] 20 (05/08 1404) BP: (133-145)/(52-62) 145/62 mmHg (05/08 1404) SpO2:  [100 %] 100 % (05/08 1404) Weight:  [40.88 kg (90 lb 2 oz)] 40.88 kg (90 lb 2 oz) (05/08 0500) Blood pressure 145/62, pulse 91, temperature 99.5 F (37.5 C), temperature source Oral, resp. rate 20, height '5\' 6"'$  (1.676 m), weight 40.88 kg (90 lb 2 oz), SpO2 100 %.   Intake/Output from previous day: 05/07 0701 - 05/08 0700 In: 1715.8 [NG/GT:120; IV Piggyback:100; TPN:1375.8] Out: 875 [Urine:875]  Intake/Output this shift: Total I/O In: 560 [P.O.:480; I.V.:20; NG/GT:60] Out: 300 [Urine:300]   General appearance:  Thin no acute distress. Affect much brighter today. Resp:  Clear to auscultation Cardio:  Regular rate and rhythm GI:  Soft nontender nondistended bowel sounds positive Extremities:  No clubbing cyanosis or edema   Lab Results: Results for orders placed or performed during the hospital encounter of 05/26/14 (from the past 24 hour(s))  Culture, blood (routine x 2)     Status: None (Preliminary result)   Collection Time: 05/31/14  5:51 PM  Result Value Ref Range   Specimen Description BLOOD    Special Requests Normal    Culture NO GROWTH < 24 HOURS    Report Status PENDING   Culture, blood (routine x 2)     Status: None (Preliminary result)   Collection Time: 05/31/14  5:59 PM  Result Value Ref Range   Specimen Description BLOOD    Special Requests Normal    Culture NO GROWTH <  24 HOURS    Report Status PENDING   Glucose, capillary     Status: Abnormal   Collection Time: 05/31/14 10:36 PM  Result Value Ref Range   Glucose-Capillary 109 (H) 70 - 99 mg/dL  Glucose, capillary     Status: Abnormal   Collection Time: 06/01/14 12:43 AM  Result Value Ref Range   Glucose-Capillary 158 (H) 70 - 99 mg/dL  Glucose, capillary     Status: Abnormal   Collection Time: 06/01/14  4:26 AM  Result Value Ref Range   Glucose-Capillary 154 (H) 70 - 99 mg/dL  CBC     Status: Abnormal   Collection Time: 06/01/14  5:27 AM  Result Value Ref Range   WBC 7.6 3.6 - 11.0 K/uL   RBC 2.79 (L) 3.80 - 5.20 MIL/uL   Hemoglobin 8.5 (L) 12.0 - 16.0 g/dL   HCT 26.4 (L) 35.0 - 47.0 %   MCV 94.6 80.0 - 100.0 fL   MCH 30.6 26.0 - 34.0 pg   MCHC 32.4 32.0 - 36.0 g/dL   RDW 15.0 (H) 11.5 - 14.5 %   Platelets 338 150 - 440 K/uL  Basic metabolic panel     Status: Abnormal   Collection Time: 06/01/14  5:27 AM  Result Value Ref Range   Sodium 137 135 - 145 mmol/L   Potassium 2.8 (LL) 3.5 - 5.1 mmol/L   Chloride 101 101 - 111 mmol/L   CO2 31 22 -  32 mmol/L   Glucose, Bld 93 65 - 99 mg/dL   BUN 6 6 - 20 mg/dL   Creatinine, Ser 0.38 (L) 0.44 - 1.00 mg/dL   Calcium 7.8 (L) 8.9 - 10.3 mg/dL   GFR calc non Af Amer >60 >60 mL/min   GFR calc Af Amer >60 >60 mL/min   Anion gap 5 5 - 15  Glucose, capillary     Status: Abnormal   Collection Time: 06/01/14  7:36 AM  Result Value Ref Range   Glucose-Capillary 122 (H) 70 - 99 mg/dL   Comment 1 Notify RN   Glucose, capillary     Status: Abnormal   Collection Time: 06/01/14 11:25 AM  Result Value Ref Range   Glucose-Capillary 119 (H) 70 - 99 mg/dL   Comment 1 Notify RN   Glucose, capillary     Status: Abnormal   Collection Time: 06/01/14  4:17 PM  Result Value Ref Range   Glucose-Capillary 121 (H) 70 - 99 mg/dL   Comment 1 Notify RN       Recent Labs  05/30/14 0300 05/30/14 0648 06/01/14 0527  WBC  --  13.4* 7.6  HGB QUESTIONABLE  IDENTIFICATION / INCORRECTLY LABELED SPECIMEN 8.9*  8.9* 8.5*  HCT QUESTIONABLE IDENTIFICATION / INCORRECTLY LABELED SPECIMEN 27.1*  26.9* 26.4*  PLT  --  302 338   BMET  Recent Labs  05/30/14 0648 06/01/14 0527  NA 141 137  K 3.1* 2.8*  CL 107 101  CO2 25 31  GLUCOSE 137* 93  BUN 7 6  CREATININE 0.34* 0.38*  CALCIUM 8.0* 7.8*   LFT No results for input(s): PROT, ALBUMIN, AST, ALT, ALKPHOS, BILITOT, BILIDIR, IBILI in the last 72 hours. PT/INR No results for input(s): LABPROT, INR in the last 72 hours. Hepatitis Panel No results for input(s): HEPBSAG, HCVAB, HEPAIGM, HEPBIGM in the last 72 hours. C-Diff No results for input(s): CDIFFTOX in the last 72 hours. No results for input(s): CDIFFPCR in the last 72 hours.   Studies/Results: No results found.  Scheduled Inpatient Medications:   . sodium chloride   Intravenous Once  . bisacodyl  10 mg Rectal Daily  . budesonide-formoterol  2 puff Inhalation BID  . clopidogrel  75 mg Oral Daily  . cycloSPORINE  1 drop Both Eyes Q12H  . feeding supplement (RESOURCE BREEZE)  1 Container Oral BID WC  . fluticasone  2 spray Each Nare Daily  . free water  60 mL Per Tube 3 times per day  . insulin aspart  0-9 Units Subcutaneous 6 times per day  . levofloxacin  500 mg Oral Daily  . pantoprazole (PROTONIX) IV  40 mg Intravenous Q12H  . potassium chloride  20 mEq Oral BID  . pregabalin  75 mg Oral BID  . senna-docusate  1 tablet Oral BID  . sodium chloride  10-40 mL Intracatheter Q12H  . cyanocobalamin  1,000 mcg Oral Daily    Continuous Inpatient Infusions:   . Marland KitchenTPN (CLINIMIX-E) Adult 55 mL/hr at 05/31/14 1908  . Marland KitchenTPN (CLINIMIX-E) Adult      PRN Inpatient Medications:  acetaminophen, acetaminophen **OR** acetaminophen, alum & mag hydroxide-simeth, dicyclomine, morphine injection, ondansetron **OR** ondansetron (ZOFRAN) IV, promethazine, sodium chloride  Miscellaneous:   Assessment:  1. Nausea vomiting abdominal pain and  melena. These symptoms have resolved. She is now tolerating full liquids. 2. History of subtotal small bowel resection with short bowel syndrome. Patient was apparently not taking a proton pump inhibitor at home. Her hospital course has  been good with steady improvement. At this point would not need to transfer her [facility] as planned unless there is some other decline.  Plan:  1. Continue PPI. Would prescribe a protonix oral suspension 40 mg twice a day as this is likely going to be observed better from her bowel. 2. Advance diet to soft/low residue tomorrow if tolerated. Likely be able to go home if she is still doing well. 3. Will need a follow-up with Dr. Cathie Olden at [facility] surgery within the next couple weeks.  Discussed with Dr. Karolee Stamps MD 06/01/2014, 4:51 PM

## 2014-06-01 NOTE — Progress Notes (Signed)
Still awaiting bed confirmation from [facility].

## 2014-06-02 LAB — BASIC METABOLIC PANEL
Anion gap: 8 (ref 5–15)
BUN: 6 mg/dL (ref 6–20)
CO2: 29 mmol/L (ref 22–32)
Calcium: 8.3 mg/dL — ABNORMAL LOW (ref 8.9–10.3)
Chloride: 102 mmol/L (ref 101–111)
Creatinine, Ser: <0.3 mg/dL — ABNORMAL LOW (ref 0.44–1.00)
Glucose, Bld: 136 mg/dL — ABNORMAL HIGH (ref 65–99)
Potassium: 3.1 mmol/L — ABNORMAL LOW (ref 3.5–5.1)
Sodium: 139 mmol/L (ref 135–145)

## 2014-06-02 LAB — GLUCOSE, CAPILLARY
Glucose-Capillary: 118 mg/dL — ABNORMAL HIGH (ref 70–99)
Glucose-Capillary: 138 mg/dL — ABNORMAL HIGH (ref 70–99)
Glucose-Capillary: 144 mg/dL — ABNORMAL HIGH (ref 70–99)
Glucose-Capillary: 145 mg/dL — ABNORMAL HIGH (ref 70–99)

## 2014-06-02 LAB — MAGNESIUM: Magnesium: 1.8 mg/dL (ref 1.7–2.4)

## 2014-06-02 LAB — HEMOGLOBIN: Hemoglobin: 9.1 g/dL — ABNORMAL LOW (ref 12.0–16.0)

## 2014-06-02 LAB — PHOSPHORUS: Phosphorus: 3.1 mg/dL (ref 2.5–4.6)

## 2014-06-02 MED ORDER — POTASSIUM CHLORIDE 20 MEQ PO PACK: 20.0000 meq | PACK | Freq: Two times a day (BID) | ORAL | Status: DC

## 2014-06-02 MED ORDER — POTASSIUM CHLORIDE 20 MEQ PO PACK
20.0000 meq | PACK | Freq: Once | ORAL | Status: DC
  Filled 2014-06-02: qty 1

## 2014-06-02 MED ORDER — PANTOPRAZOLE SODIUM 40 MG PO PACK: 40.0000 mg | PACK | Freq: Two times a day (BID) | ORAL | Status: DC

## 2014-06-02 MED ORDER — LEVOFLOXACIN 500 MG PO TABS: 500.0000 mg | ORAL_TABLET | Freq: Every day | ORAL | Status: DC

## 2014-06-02 NOTE — Progress Notes (Signed)
PARENTERAL NUTRITION CONSULT NOTE - FOLLOW UP  Pharmacy Consult for TPN Indication: protein-calorie malnutrition  Allergies  Allergen Reactions   Aspirin Itching    Patient Measurements: Height: '5\' 6"'$  (167.6 cm) Weight: 87 lb 3.2 oz (39.554 kg) IBW/kg (Calculated) : 59.3   Vital Signs: Temp: 98.2 F (36.8 C) (05/09 0505) Temp Source: Oral (05/09 0505) BP: 134/52 mmHg (05/09 0505) Pulse Rate: 84 (05/09 0505) Intake/Output from previous day: 05/08 0701 - 05/09 0700 In: 3560 [P.O.:480; I.V.:20; NG/GT:180; TPN:2880] Out: 1800 [Urine:1800] Intake/Output from this shift:    Labs:  Recent Labs  06/01/14 0527 06/02/14 0448  WBC 7.6  --   HGB 8.5* 9.1*  HCT 26.4*  --   PLT 338  --      Recent Labs  05/31/14 0624 06/01/14 0527 06/02/14 0448  NA  --  137 139  K  --  2.8* 3.1*  CL  --  101 102  CO2  --  31 29  GLUCOSE  --  93 136*  BUN  --  6 6  CREATININE  --  0.38* <0.30*  CALCIUM  --  7.8* 8.3*  MG 1.9  --  1.8  PHOS 3.0  --  3.1   CrCl cannot be calculated (Patient has no serum creatinine result on file.).    Recent Labs  06/02/14 0017 06/02/14 0405 06/02/14 0748  GLUCAP 118* 144* 145*    Medications:  Zosyn 3.375 g q8h for intra-abdominal infection Cyanocobalamin 1074mg PO QD  Pantoprazole 40 mg IV Q12H  Insulin Requirements in the past 24 hours: 3 unit  Current Nutrition: Clinimix 5/20 with MVI and trace at 30 mL/hr over 24 hours, 20% lipids twice weekly, Resoure Breeze supplementation, regular diet  Assessment:    Nutritional Goals:  1703 - 2012 kCal, 71 - 89 grams of protein per day (based on IBW of 59 kg)  Plan:  Continue TPN as ordered. Ordered additional 20 mEq potassium. Recheck labs on 5/10.    AAbbott Pao5/09/2014,7:57 AM

## 2014-06-02 NOTE — Discharge Instructions (Signed)
°  DIET:  Low Residue - low fiber  DISCHARGE CONDITION:  Stable  ACTIVITY:  Activity as tolerated  OXYGEN:  Home Oxygen: No.   Oxygen Delivery: room air  DISCHARGE LOCATION:  Home w/ Home health RN, Nutrition services.    Continue TPN as per Nutrition services.     If you experience worsening of your admission symptoms, develop shortness of breath, life threatening emergency, suicidal or homicidal thoughts you must seek medical attention immediately by calling 911 or calling your MD immediately  if symptoms less severe.  You Must read complete instructions/literature along with all the possible adverse reactions/side effects for all the Medicines you take and that have been prescribed to you. Take any new Medicines after you have completely understood and accpet all the possible adverse reactions/side effects.   Please note  You were cared for by a hospitalist during your hospital stay. If you have any questions about your discharge medications or the care you received while you were in the hospital after you are discharged, you can call the unit and asked to speak with the hospitalist on call if the hospitalist that took care of you is not available. Once you are discharged, your primary care physician will handle any further medical issues. Please note that NO REFILLS for any discharge medications will be authorized once you are discharged, as it is imperative that you return to your primary care physician (or establish a relationship with a primary care physician if you do not have one) for your aftercare needs so that they can reassess your need for medications and monitor your lab values.

## 2014-06-02 NOTE — Discharge Summary (Signed)
[place] [facility] [place] NAME: [name] [name]    MR#:  [id number]  DATE OF BIRTH:  March 30, 1955  DATE OF ADMISSION:  05/26/2014 ADMITTING PHYSICIAN: Bettey Costa, MD  DATE OF DISCHARGE: 06/02/2014  PRIMARY CARE PHYSICIAN: MASOUD,JAVED, MD    ADMISSION DIAGNOSIS:  Gastrointestinal hemorrhage with melena [K92.1] Abdominal pain, unspecified abdominal location [R10.9] Anemia, unspecified anemia type [D64.9]  DISCHARGE DIAGNOSIS:  Active Problems:   Abdominal pain   Protein-calorie malnutrition, severe   SECONDARY DIAGNOSIS:   Past Medical History  Diagnosis Date  . COPD (chronic obstructive pulmonary disease)   . Bowel obstruction     x1 month ago    HOSPITAL COURSE:   * Generalized Abdominal pain - the exact etiology of this is still unclear suspected to be secondary to gastritis.  Patient had a CT abdomen/pelvis on admission showing no acute pathology.  - recently underwent exploratory laparotomy at [facility] for Chronic mesenteric ischemia.  - Patient was seen by gastroenterology and also the general surgical service while in the hospital maintained on supportive care with IV fluids TPN supplementation pain control and antiemetics.  Patient was also started on IV Protonix.  After a few days of supportive care patient's clinical symptoms have improved she has no further abdominal pain and tolerating a low residue diet well and is therefore being discharged home with home health services.   - Surgical consultation did not recommend any urgent surgical services but recommended follow-up with her [facility] surgeon as an outpatient.  * Upper GI bleed - patient did develop some dark stools and hemoglobin dropped to as low as 7.2 patient was transfused 1 unit of packed red cells and hemoglobin improved posttransfusion.   - Patient was placed on IV Protonix but is presently being discharged on oral Protonix suspension follow-up with  her surgeon at [facility] -  Patient is tolerating a low residue diet well but will continue TPN for supplementation given her severe protein calorie malnutrition  * Fever/Sepsis - patient did develop a fever of 102 in the hospital and the source of this is still unclear suspected to be either UTI versus GI related given the patient's abdominal pain  - Empirically patient was given IV Zosyn and then switched over to IV Levaquin and blood cultures and C. difficile toxin assay are negative patient is empirically being discharged on some oral Levaquin for a few days.     *Protein-calorie malnutrition, severe - continue with TPN supplementation oral intake as needed patient is being arranged with home health dietary services to help manage her TPN he does have a G-tube but that's only used for decompression  * Hypokalemia - likely due to diarrhea  - This has since then been supplemented and improved and resolved   * COPD: no acute exacerbation. Cont. Symbicort.   DISCHARGE CONDITIONS:   Stable  CONSULTS OBTAINED:  Treatment Team:  Lollie Sails, MD  DRUG ALLERGIES:   Allergies  Allergen Reactions  . Aspirin Itching    DISCHARGE MEDICATIONS:   Current Discharge Medication List    START taking these medications   Details  alum & mag hydroxide-simeth (MAALOX/MYLANTA) 200-200-20 MG/5ML suspension Take 30 mLs by mouth every 6 (six) hours as needed for indigestion or heartburn (dyspepsia). Qty: 355 mL, Refills: 0    levofloxacin (LEVAQUIN) 500 MG tablet Take 1 tablet (500 mg total) by mouth daily. Qty: 5 tablet, Refills: 0    !! ondansetron (ZOFRAN) 4 MG tablet Take  1 tablet (4 mg total) by mouth every 6 (six) hours as needed for nausea. Qty: 20 tablet, Refills: 0    pantoprazole sodium (PROTONIX) 40 mg/20 mL PACK Place 20 mLs (40 mg total) into feeding tube 2 (two) times daily. Qty: 30 each, Refills: 1    potassium chloride (KLOR-CON) 20 MEQ packet Take 20 mEq by mouth 2 (two) times  daily. Qty: 10 packet, Refills: 0    Water For Irrigation, Sterile (FREE WATER) SOLN Place 60 mLs into feeding tube every 8 (eight) hours. Qty: 60 mL, Refills: 0     !! - Potential duplicate medications found. Please discuss with provider.    CONTINUE these medications which have NOT CHANGED   Details  acetaminophen (TYLENOL) 160 MG/5ML solution Take 649.6 mg by mouth every 6 (six) hours as needed for mild pain.    albuterol (ACCUNEB) 1.25 MG/3ML nebulizer solution Take 1 ampule by nebulization 3 (three) times daily as needed for wheezing.    antiseptic oral rinse (BIOTENE) LIQD Take 5 mLs by mouth as needed for dry mouth.     budesonide-formoterol (SYMBICORT) 160-4.5 MCG/ACT inhaler Inhale 2 puffs into the lungs 2 (two) times daily.    clopidogrel (PLAVIX) 75 MG tablet Take 75 mg by mouth daily.    cyanocobalamin 1000 MCG tablet Take 1,000 mcg by mouth daily.    cycloSPORINE (RESTASIS) 0.05 % ophthalmic emulsion Place 1 drop into both eyes every 12 (twelve) hours.    dextrose 10 % infusion Inject 70 mL/hr into the vein continuous.    dicyclomine (BENTYL) 10 MG capsule Take 10 mg by mouth 4 (four) times daily as needed for spasms.    ergocalciferol (DRISDOL) 8000 UNIT/ML drops Take 50,400 Units by mouth every 7 (seven) days.    fluticasone (FLONASE) 50 MCG/ACT nasal spray Place 2 sprays into both nostrils daily as needed for rhinitis.    HEPARIN LOCK FLUSH IV Inject 5 mLs into the vein daily.    loperamide (IMODIUM) 2 MG capsule Take 2 mg by mouth 4 (four) times daily as needed for diarrhea or loose stools.    !! ondansetron (ZOFRAN) 8 MG tablet Take 8 mg by mouth every 6 (six) hours as needed for nausea or vomiting.    pregabalin (LYRICA) 75 MG capsule Take 75 mg by mouth 2 (two) times daily.    Sodium Chloride Flush (NORMAL SALINE FLUSH IV) Inject 10 mLs into the vein every 12 (twelve) hours.    trolamine salicylate (ASPERCREME) 10 % cream Apply 1 application topically  every 8 (eight) hours as needed (for lower back pain).     !! - Potential duplicate medications found. Please discuss with provider.    STOP taking these medications     omeprazole (PRILOSEC) 20 MG capsule          DISCHARGE INSTRUCTIONS:   DIET:  Low Residue w/ TPN supplemention.   DISCHARGE CONDITION:  Stable  ACTIVITY:  Activity as tolerated  OXYGEN:  Home Oxygen: No.   Oxygen Delivery: room air  DISCHARGE LOCATION:  Home w/ [place], Dietary Services.    If you experience worsening of your admission symptoms, develop shortness of breath, life threatening emergency, suicidal or homicidal thoughts you must seek medical attention immediately by calling 911 or calling your MD immediately  if symptoms less severe.  You Must read complete instructions/literature along with all the possible adverse reactions/side effects for all the Medicines you take and that have been prescribed to you. Take any new  Medicines after you have completely understood and accpet all the possible adverse reactions/side effects.   Please note  You were cared for by a hospitalist during your hospital stay. If you have any questions about your discharge medications or the care you received while you were in the hospital after you are discharged, you can call the unit and asked to speak with the hospitalist on call if the hospitalist that took care of you is not available. Once you are discharged, your primary care physician will handle any further medical issues. Please note that NO REFILLS for any discharge medications will be authorized once you are discharged, as it is imperative that you return to your primary care physician (or establish a relationship with a primary care physician if you do not have one) for your aftercare needs so that they can reassess your need for medications and monitor your lab values.     Today   CHIEF COMPLAINT:   Chief Complaint  Patient presents with  .  Abdominal Pain    HISTORY OF PRESENT ILLNESS:  [name] [name]  is a 59 y.o. female with a known history of COPD, IBS, GERD/gastritis, s/p recent exp. laparatomy w/ extensive small bowel resection due to SMA occulsion and SMA syndrome who presented to the hospital w/ abdominal pain.   VITAL SIGNS:  Blood pressure 160/90, pulse 100, temperature 98.8 F (37.1 C), temperature source Oral, resp. rate 16, height '5\' 6"'$  (1.676 m), weight 39.554 kg (87 lb 3.2 oz), SpO2 100 %.  I/O:   Intake/Output Summary (Last 24 hours) at 06/02/14 1625 Last data filed at 06/02/14 1300  Gross per 24 hour  Intake   3720 ml  Output   1500 ml  Net   2220 ml    PHYSICAL EXAMINATION:  GENERAL:  59 y.o.-year-old thin, cachectic patient lying in the bed with no acute distress.  EYES: Pupils equal, round, reactive to light and accommodation. No scleral icterus. Extraocular muscles intact.  HEENT: Head atraumatic, normocephalic. Oropharynx and nasopharynx clear.  NECK:  Supple, no jugular venous distention. No thyroid enlargement, no tenderness.  LUNGS: Normal breath sounds bilaterally, no wheezing, rales,rhonchi. No use of accessory muscles of respiration.  CARDIOVASCULAR: S1, S2 normal. No murmurs, rubs, or gallops.  ABDOMEN: Soft, non-tender, non-distended. Bowel sounds present. No organomegaly or mass.  EXTREMITIES: No pedal edema, cyanosis, or clubbing.  NEUROLOGIC: Cranial nerves II through XII are intact. No focal motor or sensory defecits b/l.  PSYCHIATRIC: The patient is alert and oriented x 3.  SKIN: No obvious rash, lesion, or ulcer.   DATA REVIEW:   CBC  Recent Labs Lab 06/01/14 0527 06/02/14 0448  WBC 7.6  --   HGB 8.5* 9.1*  HCT 26.4*  --   PLT 338  --     Chemistries   Recent Labs Lab 06/02/14 0448  NA 139  K 3.1*  CL 102  CO2 29  GLUCOSE 136*  BUN 6  CREATININE <0.30*  CALCIUM 8.3*  MG 1.8    Cardiac Enzymes No results for input(s): TROPONINI in the last 168  hours.  Microbiology Results  Results for orders placed or performed during the hospital encounter of 05/26/14  C difficile quick scan w PCR reflex [facility])     Status: None   Collection Time: 05/27/14  5:05 PM  Result Value Ref Range Status   C Diff antigen NEGATIVE  Final   C Diff toxin NEGATIVE  Final   C Diff interpretation Negative for C.  difficile  Final  Culture, blood (routine x 2)     Status: None (Preliminary result)   Collection Time: 05/31/14  5:51 PM  Result Value Ref Range Status   Specimen Description BLOOD  Final   Special Requests Normal  Final   Culture NO GROWTH 2 DAYS  Final   Report Status PENDING  Incomplete  Culture, blood (routine x 2)     Status: None (Preliminary result)   Collection Time: 05/31/14  5:59 PM  Result Value Ref Range Status   Specimen Description BLOOD  Final   Special Requests Normal  Final   Culture NO GROWTH 2 DAYS  Final   Report Status PENDING  Incomplete    RADIOLOGY:  No results found.    Management plans discussed with the patient, family and they are in agreement.  CODE STATUS:     Code Status Orders        Start     Ordered   05/26/14 1912  Full code   Continuous     05/26/14 1912      TOTAL TIME TAKING CARE OF THIS PATIENT: 40 minutes.    Henreitta Leber M.D on 06/02/2014 at 4:25 PM  Between 7am to 6pm - Pager - [phone]  After 6pm go to [email or web address] - password [email or web address] [place] [facility]  Office  [phone]  CC: Primary care physician; Cletis Athens, MD

## 2014-06-02 NOTE — Plan of Care (Addendum)
Problem: Discharge Progression Outcomes Goal: Discharge plan in place and appropriate Individualization of Care:   Outcome: Progressing 1. Like to be called [name] [name] 2. Currently living at [facility] for rehab 3. In a substance abuse program 4. Hx of bowel obstruction and COPD, controlled by home medications Goal: Other Discharge Outcomes/Goals Outcome: Progressing Plan of care progress to goal: Afebrile. Denies pain. Stool green/brown, No signs of bleeding. No c/o n/v. Continue TPN. A[facility] a [facility] at [facility].

## 2014-06-02 NOTE — Care Management (Signed)
Discharge to home today per Dr. Heron Sabins. Spoke with [name] at the bedside. States she would like [place] for her [place] and TPN.  Edward Qualia, RN representative for [facility] updated. States her aide will be picking her up and take her home. States her aide comes 7 days a week for 2.5 a day. Shelbie Ammons RN MSN Care Management [phone]

## 2014-06-05 LAB — CULTURE, BLOOD (ROUTINE X 2)
Culture: NO GROWTH
Culture: NO GROWTH
Special Requests: NORMAL
Special Requests: NORMAL

## 2014-06-14 ENCOUNTER — Emergency Department: Payer: Medicare Other

## 2014-06-14 ENCOUNTER — Encounter: Payer: Self-pay | Admitting: *Deleted

## 2014-06-14 ENCOUNTER — Observation Stay
Admission: EM | Admit: 2014-06-14 | Discharge: 2014-06-15 | Disposition: A | Discharge status: Home / Self Care | Payer: Medicare Other | Attending: Internal Medicine | Admitting: Internal Medicine

## 2014-06-14 DIAGNOSIS — J449 Chronic obstructive pulmonary disease, unspecified: Secondary | ICD-10-CM | POA: Diagnosis not present

## 2014-06-14 DIAGNOSIS — Z7951 Long term (current) use of inhaled steroids: Secondary | ICD-10-CM | POA: Insufficient documentation

## 2014-06-14 DIAGNOSIS — R609 Edema, unspecified: Secondary | ICD-10-CM | POA: Insufficient documentation

## 2014-06-14 DIAGNOSIS — K551 Chronic vascular disorders of intestine: Principal | ICD-10-CM | POA: Diagnosis present

## 2014-06-14 DIAGNOSIS — E43 Unspecified severe protein-calorie malnutrition: Secondary | ICD-10-CM | POA: Diagnosis not present

## 2014-06-14 DIAGNOSIS — Z886 Allergy status to analgesic agent status: Secondary | ICD-10-CM | POA: Diagnosis not present

## 2014-06-14 DIAGNOSIS — Z79899 Other long term (current) drug therapy: Secondary | ICD-10-CM | POA: Insufficient documentation

## 2014-06-14 DIAGNOSIS — R1084 Generalized abdominal pain: Secondary | ICD-10-CM | POA: Insufficient documentation

## 2014-06-14 DIAGNOSIS — Z931 Gastrostomy status: Secondary | ICD-10-CM | POA: Insufficient documentation

## 2014-06-14 DIAGNOSIS — K559 Vascular disorder of intestine, unspecified: Secondary | ICD-10-CM | POA: Diagnosis present

## 2014-06-14 DIAGNOSIS — F17210 Nicotine dependence, cigarettes, uncomplicated: Secondary | ICD-10-CM | POA: Insufficient documentation

## 2014-06-14 DIAGNOSIS — R112 Nausea with vomiting, unspecified: Secondary | ICD-10-CM | POA: Diagnosis not present

## 2014-06-14 LAB — CBC WITH DIFFERENTIAL/PLATELET
Basophils Absolute: 0.1 10*3/uL (ref 0–0.1)
Basophils Relative: 1 %
Eosinophils Absolute: 0.1 10*3/uL (ref 0–0.7)
Eosinophils Relative: 2 %
HCT: 35.8 % (ref 35.0–47.0)
Hemoglobin: 11.5 g/dL — ABNORMAL LOW (ref 12.0–16.0)
Lymphocytes Relative: 41 %
Lymphs Abs: 3.9 10*3/uL — ABNORMAL HIGH (ref 1.0–3.6)
MCH: 30.4 pg (ref 26.0–34.0)
MCHC: 32.2 g/dL (ref 32.0–36.0)
MCV: 94.3 fL (ref 80.0–100.0)
Monocytes Absolute: 0.5 10*3/uL (ref 0.2–0.9)
Monocytes Relative: 5 %
Neutro Abs: 4.9 10*3/uL (ref 1.4–6.5)
Neutrophils Relative %: 51 %
Platelets: 440 10*3/uL (ref 150–440)
RBC: 3.8 MIL/uL (ref 3.80–5.20)
RDW: 14.7 % — ABNORMAL HIGH (ref 11.5–14.5)
WBC: 9.5 10*3/uL (ref 3.6–11.0)

## 2014-06-14 LAB — LACTIC ACID, PLASMA: Lactic Acid, Venous: 0.7 mmol/L (ref 0.5–2.0)

## 2014-06-14 LAB — COMPREHENSIVE METABOLIC PANEL
ALT: 24 U/L (ref 14–54)
AST: 19 U/L (ref 15–41)
Albumin: 2.7 g/dL — ABNORMAL LOW (ref 3.5–5.0)
Alkaline Phosphatase: 88 U/L (ref 38–126)
Anion gap: 6 (ref 5–15)
BUN: 12 mg/dL (ref 6–20)
CO2: 29 mmol/L (ref 22–32)
Calcium: 8.2 mg/dL — ABNORMAL LOW (ref 8.9–10.3)
Chloride: 100 mmol/L — ABNORMAL LOW (ref 101–111)
Creatinine, Ser: 0.43 mg/dL — ABNORMAL LOW (ref 0.44–1.00)
GFR calc Af Amer: >60 mL/min (ref >60)
GFR calc non Af Amer: >60 mL/min (ref >60)
Glucose, Bld: 114 mg/dL — ABNORMAL HIGH (ref 65–99)
Potassium: 3.9 mmol/L (ref 3.5–5.1)
Sodium: 135 mmol/L (ref 135–145)
Total Bilirubin: 0.3 mg/dL (ref 0.3–1.2)
Total Protein: 6.3 g/dL — ABNORMAL LOW (ref 6.5–8.1)

## 2014-06-14 LAB — LIPASE, BLOOD: Lipase: 24 U/L (ref 22–51)

## 2014-06-14 MED ORDER — ONDANSETRON HCL 4 MG/2ML IJ SOLN
4.0000 mg | Freq: Once | INTRAMUSCULAR | Status: AC
  Administered 2014-06-14: 4 mg via INTRAVENOUS

## 2014-06-14 MED ORDER — SODIUM CHLORIDE 0.9 % IV SOLN
INTRAVENOUS | Status: DC
  Administered 2014-06-15: via INTRAVENOUS
  Administered 2014-06-15: 10:00:00 100 mL via INTRAVENOUS

## 2014-06-14 MED ORDER — HEPARIN SODIUM (PORCINE) 5000 UNIT/ML IJ SOLN: 5000.0000 [IU] | Freq: Three times a day (TID) | INTRAMUSCULAR | Status: DC

## 2014-06-14 MED ORDER — HYDROMORPHONE HCL 1 MG/ML IJ SOLN
0.5000 mg | INTRAMUSCULAR | Status: DC | PRN
  Filled 2014-06-14: qty 1

## 2014-06-14 MED ORDER — IOHEXOL 350 MG/ML SOLN
75.0000 mL | Freq: Once | INTRAVENOUS | Status: AC | PRN
  Administered 2014-06-14: 75 mL via INTRAVENOUS

## 2014-06-14 MED ORDER — ONDANSETRON HCL 4 MG PO TABS: 4.0000 mg | ORAL_TABLET | Freq: Four times a day (QID) | ORAL | Status: DC | PRN

## 2014-06-14 MED ORDER — ACETAMINOPHEN 650 MG RE SUPP: 650.0000 mg | Freq: Four times a day (QID) | RECTAL | Status: DC | PRN

## 2014-06-14 MED ORDER — MORPHINE SULFATE 2 MG/ML IJ SOLN
2.0000 mg | INTRAMUSCULAR | Status: DC | PRN
  Administered 2014-06-15: 2 mg via INTRAVENOUS
  Filled 2014-06-14: qty 1

## 2014-06-14 MED ORDER — ONDANSETRON HCL 4 MG/2ML IJ SOLN
4.0000 mg | Freq: Four times a day (QID) | INTRAMUSCULAR | Status: DC | PRN
  Administered 2014-06-15: 11:00:00 4 mg via INTRAVENOUS
  Filled 2014-06-14: qty 2

## 2014-06-14 MED ORDER — ACETAMINOPHEN 325 MG PO TABS: 650.0000 mg | ORAL_TABLET | Freq: Four times a day (QID) | ORAL | Status: DC | PRN

## 2014-06-14 MED ORDER — HYDROMORPHONE HCL 1 MG/ML IJ SOLN
INTRAMUSCULAR | Status: AC
  Administered 2014-06-14: 0.5 mg
  Filled 2014-06-14: qty 1

## 2014-06-14 MED ORDER — ONDANSETRON HCL 4 MG/2ML IJ SOLN
INTRAMUSCULAR | Status: AC
  Filled 2014-06-14: qty 2

## 2014-06-14 NOTE — ED Provider Notes (Signed)
[facility] Emergency Department Provider Note  ____________________________________________  Time seen: 1650  I have reviewed the triage vital signs and the nursing notes.   HISTORY  Chief Complaint Abdominal Pain; Nausea; and Emesis     HPI [name] [name] is a 59 y.o. female who had an abdominal surgery in April. She had half of her intestinal tract taken out, per the patient, due to blood flow problems. She is currently being fed solely through her central line. She does tolerate fluids by mouth. She has a G-tube in place.  Patient reports she is been having abdominal pain with nausea, no emesis, and with diarrhea. This began last night. It worsened today. She denies fever, chest pain, shortness of breath. She has had similar symptoms before. She was in the emergency department on May 2 with similar. At that time she had a CT scan that was negative for an acute process.     Past Medical History  Diagnosis Date  . COPD (chronic obstructive pulmonary disease)   . Bowel obstruction     x1 month ago   past surgical history: Bowel resection at [facility]. Likely for ischemic bowel.  Patient Active Problem List   Diagnosis Date Noted  . Ischemic bowel disease 06/14/2014  . Protein-calorie malnutrition, severe 05/27/2014  . Abdominal pain 05/26/2014    History reviewed. No pertinent past surgical history.  Current Outpatient Rx  Name  Route  Sig  Dispense  Refill  . acetaminophen (TYLENOL) 160 MG/5ML solution   Oral   Take 649.6 mg by mouth every 6 (six) hours as needed for mild pain.         Marland Kitchen albuterol (ACCUNEB) 1.25 MG/3ML nebulizer solution   Nebulization   Take 1 ampule by nebulization 3 (three) times daily as needed for wheezing.         Marland Kitchen alum & mag hydroxide-simeth (MAALOX/MYLANTA) 200-200-20 MG/5ML suspension   Oral   Take 30 mLs by mouth every 6 (six) hours as needed for indigestion or heartburn (dyspepsia).   355 mL   0   . antiseptic  oral rinse (BIOTENE) LIQD   Oral   Take 5 mLs by mouth as needed for dry mouth.          . budesonide-formoterol (SYMBICORT) 160-4.5 MCG/ACT inhaler   Inhalation   Inhale 2 puffs into the lungs 2 (two) times daily.         . clopidogrel (PLAVIX) 75 MG tablet   Oral   Take 75 mg by mouth daily.         . cyanocobalamin 1000 MCG tablet   Oral   Take 1,000 mcg by mouth daily.         . cycloSPORINE (RESTASIS) 0.05 % ophthalmic emulsion   Both Eyes   Place 1 drop into both eyes every 12 (twelve) hours.         Marland Kitchen dextrose 10 % infusion   Intravenous   Inject 70 mL/hr into the vein continuous.         Marland Kitchen dicyclomine (BENTYL) 10 MG capsule   Oral   Take 10 mg by mouth 4 (four) times daily as needed for spasms.         . ergocalciferol (DRISDOL) 8000 UNIT/ML drops   Oral   Take 50,400 Units by mouth every 7 (seven) days.         . fluticasone (FLONASE) 50 MCG/ACT nasal spray   Each Nare   Place 2 sprays into  both nostrils daily as needed for rhinitis.         Marland Kitchen HEPARIN LOCK FLUSH IV   Intravenous   Inject 5 mLs into the vein daily.         Marland Kitchen levofloxacin (LEVAQUIN) 500 MG tablet   Oral   Take 1 tablet (500 mg total) by mouth daily.   5 tablet   0   . loperamide (IMODIUM) 2 MG capsule   Oral   Take 2 mg by mouth 4 (four) times daily as needed for diarrhea or loose stools.         . ondansetron (ZOFRAN) 4 MG tablet   Oral   Take 1 tablet (4 mg total) by mouth every 6 (six) hours as needed for nausea.   20 tablet   0   . ondansetron (ZOFRAN) 8 MG tablet   Oral   Take 8 mg by mouth every 6 (six) hours as needed for nausea or vomiting.         . pantoprazole sodium (PROTONIX) 40 mg/20 mL PACK   Per Tube   Place 20 mLs (40 mg total) into feeding tube 2 (two) times daily.   30 each   1   . EXPIRED: potassium chloride (KLOR-CON) 20 MEQ packet   Oral   Take 20 mEq by mouth 2 (two) times daily.   10 packet   0   . pregabalin (LYRICA) 75 MG  capsule   Oral   Take 75 mg by mouth 2 (two) times daily.         . Sodium Chloride Flush (NORMAL SALINE FLUSH IV)   Intravenous   Inject 10 mLs into the vein every 12 (twelve) hours.         . trolamine salicylate (ASPERCREME) 10 % cream   Topical   Apply 1 application topically every 8 (eight) hours as needed (for lower back pain).         . Water For Irrigation, Sterile (FREE WATER) SOLN   Per Tube   Place 60 mLs into feeding tube every 8 (eight) hours.   60 mL   0     Allergies Aspirin  History reviewed. No pertinent family history.  Social History History  Substance Use Topics  . Smoking status: Current Every Day Smoker -- 1.00 packs/day    Types: Cigarettes  . Smokeless tobacco: Not on file  . Alcohol Use: No    Review of Systems  Constitutional: Negative for fever. ENT: Negative for sore throat. Cardiovascular: Negative for chest pain. Respiratory: Negative for shortness of breath. Gastrointestinal: Positive for abdominal pain, nausea, diarrhea. See history of present illness.. Genitourinary: Negative for dysuria. Musculoskeletal: Negative for back pain. Skin: Negative for rash. Neurological: Negative for headaches   10-point ROS otherwise negative.  ____________________________________________   PHYSICAL EXAM:  VITAL SIGNS: ED Triage Vitals  Enc Vitals Group     BP 06/14/14 1658 174/77 mmHg     Pulse Rate 06/14/14 1658 52     Resp --      Temp 06/14/14 1658 98.8 F (37.1 C)     Temp Source 06/14/14 1658 Oral     SpO2 06/14/14 1658 97 %     Weight 06/14/14 1658 98 lb 4.8 oz (44.589 kg)     Height 06/14/14 1658 '5\' 5"'$  (1.651 m)     Head Cir --      Peak Flow --      Pain Score 06/14/14 1701 9  Pain Loc --      Pain Edu? --      Excl. in [place]? --     Constitutional: Alert and oriented. Frail appearing female. She is communicative. She does have abdominal pain but is not in a great field distress on general exam. ENT   Head:  Normocephalic and atraumatic.   Nose: No congestion/rhinnorhea.   Mouth/Throat: Mucous membranes are moist. Cardiovascular: Normal rate, regular rhythm. Respiratory: Normal respiratory effort without tachypnea. Breath sounds are clear and equal bilaterally. No wheezes/rales/rhonchi. Gastrointestinal: Surgical incision is well-healed. There is a G-tube in the left abdomen. The abdomen is firm and tender throughout, but especially in the right lower quadrant.  Back: There is no CVA tenderness. Musculoskeletal: Thin patient with minimal muscle mass. She has good range of motion and no deformities.. Neurologic:  Normal speech and language. No gross focal neurologic deficits are appreciated.  Skin:  Skin is warm, dry. No rash noted. Psychiatric: Mood and affect are normal. Speech and behavior are normal. She becomes tearful after our discussion about her condition and the need for another CT scan given her current symptoms. ____________________________________________    LABS (pertinent positives/negatives)  CBC is normal at 9.5 hemoglobin of 11.5. Metabolic panel is unremarkable with normal sodium and potassium. Renal function is normal. Lactic acid level is normal at 0.7. 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  Mucosal enhancement, wall thickening and mesenteric edema involving multiple small bowel loops in the right lower pelvis. While this could represent infectious enteritis, given occlusion of to mesenteric vessels, I cannot exclude bowel ischemia.  ____________________________________________   PROCEDURES  ____________________________________________   INITIAL IMPRESSION / ASSESSMENT AND PLAN / ED COURSE  This patient is competent history with ischemic bowel. She had  acute abdominal pain upon arrival. We performed a CT scan to evaluate for new ischemic changes. 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 These are similar to her CT performed in February of this year. We called [facility] to speak with the on-call surgeon to review this patient's situation. We discussed the case with Dr. Maudie Mercury. Dr. Maudie Mercury appeared frustrated with the call and felt she did not have an active surgical need at this time and that I should be speaking with her surgeon.   With the patient having chronic changes on CT and a normal lactic acid level, we chose to admit the patient to [facility] for ongoing care to the medical service. This was discussed with the patient and she prefer this as opposed to being transferred to [facility]. This was discussed with Dr. Suzzette Righter who will admit the patient.  ____________________________________________   FINAL CLINICAL IMPRESSION(S) / ED DIAGNOSES  Final diagnoses:  Ischemic bowel disease  Generalized abdominal pain      Ahmed Prima, MD 06/14/14 2156

## 2014-06-14 NOTE — ED Notes (Signed)
Pt to ED from home with onset of n/v/d and right lower quad pain that begun this am. Pt is tender to palpation , vitals wnl at this time. Pt has central line in right clavicular, current TPA administration going at time of arrival. Pt is AAOx3, no acute distress noted. MD kaminski at bedside on arrival.

## 2014-06-14 NOTE — H&P (Signed)
[place] [facility] [place] NAME: [name] [name]    MR#:  [id number]  DATE OF BIRTH:  1955/07/01   DATE OF ADMISSION:  06/14/2014  PRIMARY CARE PHYSICIAN: Cletis Athens, MD   REQUESTING/REFERRING PHYSICIAN: Thomasene Lot  CHIEF COMPLAINT:   Chief Complaint  Patient presents with  . Abdominal Pain  . Nausea  . Emesis    HISTORY OF PRESENT ILLNESS:  [name] [name]  is a 59 y.o. female with a known history of chronic mesenteric ischemia, COPD presenting with abdominal pain. She describes one day duration of abdominal pain periumbilical in location, abdominal/crampy in quality, 7-8 out of 10 intensity, nonradiating, no worsening/relieving factors. She is a complicated abdominal history including surgery for ischemic bowel, small bowel obstruction, GI bleeds. She is also TPN dependent, however does tolerate by mouth. She also has a G-tube in place which currently serves no function. Emergency department course: Initial evaluation by ED staff CT reveals concern for ischemic bowel, however remainder of labs and physical are reassuring. Case discussed with [facility], where she had the majority of her surgery, who believe there is no emergent surgical issues at this time that warrant transfer.  PAST MEDICAL HISTORY:   Past Medical History  Diagnosis Date  . COPD (chronic obstructive pulmonary disease)   . Bowel obstruction     x1 month ago    PAST SURGICAL HISTORY:  History reviewed. No pertinent past surgical history.  SOCIAL HISTORY:   History  Substance Use Topics  . Smoking status: Current Every Day Smoker -- 0.50 packs/day    Types: Cigarettes  . Smokeless tobacco: Not on file  . Alcohol Use: No    FAMILY HISTORY:   Family History  Problem Relation Age of Onset  . Heart failure Neg Hx   . Diabetes Neg Hx     DRUG ALLERGIES:   Allergies  Allergen Reactions  . Aspirin Itching    REVIEW OF SYSTEMS:  REVIEW OF SYSTEMS:   CONSTITUTIONAL: Denies fevers, chills, positive for fatigue, weakness.  EYES: Denies blurred vision, double vision, or eye pain.  EARS, NOSE, THROAT: Denies tinnitus, ear pain, hearing loss.  RESPIRATORY: denies cough, shortness of breath, wheezing  CARDIOVASCULAR: Denies chest pain, palpitations, edema.  GASTROINTESTINAL: Positive nausea, denies vomiting, diarrhea, positive abdominal pain.  GENITOURINARY: Denies dysuria, hematuria.  ENDOCRINE: Denies nocturia or thyroid problems. HEMATOLOGIC AND LYMPHATIC: Denies easy bruising or bleeding.  SKIN: Denies rash or lesions.  MUSCULOSKELETAL: Denies pain in neck, back, shoulder, knees, hips, or further arthritic symptoms.  NEUROLOGIC: Denies paralysis, paresthesias.  PSYCHIATRIC: Denies anxiety or depressive symptoms. Otherwise full review of systems performed by me is negative.   MEDICATIONS AT HOME:   Prior to Admission medications   Medication Sig Start Date End Date Taking? Authorizing Provider  acetaminophen (TYLENOL) 160 MG/5ML solution Take 649.6 mg by mouth every 6 (six) hours as needed for mild pain.    Historical Provider, MD  albuterol (ACCUNEB) 1.25 MG/3ML nebulizer solution Take 1 ampule by nebulization 3 (three) times daily as needed for wheezing.    Historical Provider, MD  alum & mag hydroxide-simeth (MAALOX/MYLANTA) 200-200-20 MG/5ML suspension Take 30 mLs by mouth every 6 (six) hours as needed for indigestion or heartburn (dyspepsia). 05/29/14   Fritzi Mandes, MD  antiseptic oral rinse (BIOTENE) LIQD Take 5 mLs by mouth as needed for dry mouth.     Historical Provider, MD  budesonide-formoterol (SYMBICORT) 160-4.5 MCG/ACT inhaler Inhale 2 puffs into the lungs 2 (two)  times daily.    Historical Provider, MD  clopidogrel (PLAVIX) 75 MG tablet Take 75 mg by mouth daily.    Historical Provider, MD  cyanocobalamin 1000 MCG tablet Take 1,000 mcg by mouth daily.    Historical Provider, MD  cycloSPORINE (RESTASIS) 0.05 % ophthalmic  emulsion Place 1 drop into both eyes every 12 (twelve) hours.    Historical Provider, MD  dextrose 10 % infusion Inject 70 mL/hr into the vein continuous.    Historical Provider, MD  dicyclomine (BENTYL) 10 MG capsule Take 10 mg by mouth 4 (four) times daily as needed for spasms.    Historical Provider, MD  ergocalciferol (DRISDOL) 8000 UNIT/ML drops Take 50,400 Units by mouth every 7 (seven) days.    Historical Provider, MD  fluticasone (FLONASE) 50 MCG/ACT nasal spray Place 2 sprays into both nostrils daily as needed for rhinitis.    Historical Provider, MD  HEPARIN LOCK FLUSH IV Inject 5 mLs into the vein daily.    Historical Provider, MD  levofloxacin (LEVAQUIN) 500 MG tablet Take 1 tablet (500 mg total) by mouth daily. 06/02/14   Henreitta Leber, MD  loperamide (IMODIUM) 2 MG capsule Take 2 mg by mouth 4 (four) times daily as needed for diarrhea or loose stools.    Historical Provider, MD  ondansetron (ZOFRAN) 4 MG tablet Take 1 tablet (4 mg total) by mouth every 6 (six) hours as needed for nausea. 05/29/14   Fritzi Mandes, MD  ondansetron (ZOFRAN) 8 MG tablet Take 8 mg by mouth every 6 (six) hours as needed for nausea or vomiting.    Historical Provider, MD  pantoprazole sodium (PROTONIX) 40 mg/20 mL PACK Place 20 mLs (40 mg total) into feeding tube 2 (two) times daily. 06/02/14   Henreitta Leber, MD  potassium chloride (KLOR-CON) 20 MEQ packet Take 20 mEq by mouth 2 (two) times daily. 06/02/14 06/07/14  Henreitta Leber, MD  pregabalin (LYRICA) 75 MG capsule Take 75 mg by mouth 2 (two) times daily.    Historical Provider, MD  Sodium Chloride Flush (NORMAL SALINE FLUSH IV) Inject 10 mLs into the vein every 12 (twelve) hours.    Historical Provider, MD  trolamine salicylate (ASPERCREME) 10 % cream Apply 1 application topically every 8 (eight) hours as needed (for lower back pain).    Historical Provider, MD  Water For Irrigation, Sterile (FREE WATER) SOLN Place 60 mLs into feeding tube every 8 (eight) hours.  05/29/14   Fritzi Mandes, MD      VITAL SIGNS:  Blood pressure 174/77, pulse 52, temperature 98.8 F (37.1 C), temperature source Oral, height '5\' 5"'$  (1.651 m), weight 98 lb 4.8 oz (44.589 kg), SpO2 97 %.  PHYSICAL EXAMINATION:  VITAL SIGNS: Filed Vitals:   06/14/14 1658  BP: 174/77  Pulse: 52  Temp: 98.8 F (37.1 C)   GENERAL:59 y.o.female chronically ill/frail appearing HEAD: Normocephalic, atraumatic.  EYES: Pupils equal, round, reactive to light. Extraocular muscles intact. No scleral icterus.  MOUTH: Dry mucosal membrane. Dentition intact. No abscess noted.  EAR, NOSE, THROAT: Clear without exudates. No external lesions.  NECK: Supple. No thyromegaly. No nodules. No JVD.  PULMONARY: Clear to ascultation, without wheeze rails or rhonci. No use of accessory muscles, Good respiratory effort. good air entry bilaterally CHEST: Nontender to palpation. Right-sided central line in place without surrounding erythema CARDIOVASCULAR: S1 and S2. Regular rate and rhythm. No murmurs, rubs, or gallops. No edema. Pedal pulses 2+ bilaterally.  GASTROINTESTINAL: Soft, nontender, nondistended. No masses. Positive  bowel sounds. No hepatosplenomegaly. G-tube in place without surrounding erythema or discharge MUSCULOSKELETAL: No swelling, clubbing, or edema. Range of motion full in all extremities.  NEUROLOGIC: Cranial nerves II through XII are intact. No gross focal neurological deficits. Sensation intact. Reflexes intact.  SKIN: No ulceration, lesions, rashes, or cyanosis. Skin warm and dry. Turgor intact.  PSYCHIATRIC: Mood, affect within normal limits. The patient is awake, alert and oriented x 3. Insight, judgment intact.    LABORATORY PANEL:   CBC  Recent Labs Lab 06/14/14 1702  WBC 9.5  HGB 11.5*  HCT 35.8  PLT 440   ------------------------------------------------------------------------------------------------------------------  Chemistries   Recent Labs Lab 06/14/14 1702  NA  135  K 3.9  CL 100*  CO2 29  GLUCOSE 114*  BUN 12  CREATININE 0.43*  CALCIUM 8.2*  AST 19  ALT 24  ALKPHOS 88  BILITOT 0.3   ------------------------------------------------------------------------------------------------------------------  Cardiac Enzymes No results for input(s): TROPONINI in the last 168 hours. ------------------------------------------------------------------------------------------------------------------  RADIOLOGY:  Ct Cta Abd/pel W/cm &/or W/o Cm  06/14/2014   CLINICAL DATA:  Nausea, abdominal pain last night. Recent bowel ischemia and bowel resection.  EXAM: CTA ABDOMEN AND PELVIS wITHOUT AND WITH CONTRAST  TECHNIQUE: Multidetector CT imaging of the abdomen and pelvis was performed using the standard protocol during bolus administration of intravenous contrast. Multiplanar reconstructed images and MIPs were obtained and reviewed to evaluate the vascular anatomy.  CONTRAST:  29m OMNIPAQUE IOHEXOL 350 MG/ML SOLN  COMPARISON:  05/26/2014, 03/17/2014.  FINDINGS: Lower chest: Lung bases are clear. No effusions. Heart is normal size.  Hepatobiliary: No focal hepatic abnormality. Gallbladder not visualized, question contraction or prior cholecystectomy.  Pancreas: No focal abnormality or ductal dilatation.  Spleen: No focal abnormality.  Normal size.  Adrenals/Urinary Tract: No focal renal or adrenal abnormality. No hydronephrosis. Urinary bladder is unremarkable.  Stomach/Bowel: Gastrostomy tube in the stomach. There is mucosal enhancement within small bowel loops in the pelvis with mild associated wall thickening and surrounding mesenteric stranding. Multiple air-fluid levels throughout the colon.  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 Inferior mesenteric artery is patent. Single renal arteries are widely patent. Mild atherosclerotic calcifications and plaque in the infrarenal aorta and common iliac arteries. No aneurysm or  dissection.  Reproductive: No mass or other significant abnormality.  Other: No free fluid or free air. Prominent left gonadal vein likely reflective of reflux.  Musculoskeletal: No focal bone lesion or acute bony abnormality.  Review of the MIP images confirms the above findings.  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  Mucosal enhancement, wall thickening and mesenteric edema involving multiple small bowel loops in the right lower pelvis. While this could represent infectious enteritis, given occlusion of to mesenteric vessels, I cannot exclude bowel ischemia.   Electronically Signed   By: KRolm BaptiseM.D.   On: 06/14/2014 19:38    EKG:   Orders placed or performed during the hospital encounter of 05/26/14  . ED EKG  . ED EKG  . EKG    IMPRESSION AND PLAN:   58year old African female history of chronic mesenteric ischemia, COPD presenting with abdominal pain.  1. Intestinal angina: Imaging reveals chronic disease, labs including lactic acid are reassuring, we'll provide supportive care including IV fluid hydration and pain medication as required. [facility], we will consult our own surgery services in case she takes a turn for the worse.  Continue Plavix 2. COPD, unspecified: Continue Symbicort 3. Tobacco abuse: Though she has cut down I have recommended tobacco cessation particularly given her vascular illnesses 4.Venous thromboembolism prophylactic: SCD     All the records are reviewed and case discussed with ED provider. Management plans discussed with the patient, family and they are in agreement.  CODE STATUS: Full  TOTAL TIME TAKING CARE OF THIS PATIENT: 35 minutes.    Neyah Ellerman,  Karenann Cai.D on 06/14/2014 at 10:19 PM  Between 7am to 6pm - Pager - [phone]  After 6pm: House Pager: - [phone]  [facility]  Office  [phone]  CC: Primary  care physician; Cletis Athens, MD

## 2014-06-15 DIAGNOSIS — K551 Chronic vascular disorders of intestine: Secondary | ICD-10-CM | POA: Diagnosis not present

## 2014-06-15 LAB — CBC
HCT: 32.4 % — ABNORMAL LOW (ref 35.0–47.0)
Hemoglobin: 10.7 g/dL — ABNORMAL LOW (ref 12.0–16.0)
MCH: 31.1 pg (ref 26.0–34.0)
MCHC: 32.8 g/dL (ref 32.0–36.0)
MCV: 94.6 fL (ref 80.0–100.0)
Platelets: 405 10*3/uL (ref 150–440)
RBC: 3.43 MIL/uL — ABNORMAL LOW (ref 3.80–5.20)
RDW: 14.5 % (ref 11.5–14.5)
WBC: 9.2 10*3/uL (ref 3.6–11.0)

## 2014-06-15 LAB — CREATININE, SERUM
Creatinine, Ser: 0.43 mg/dL — ABNORMAL LOW (ref 0.44–1.00)
GFR calc Af Amer: >60 mL/min (ref >60)
GFR calc non Af Amer: >60 mL/min (ref >60)

## 2014-06-15 LAB — URINALYSIS COMPLETE WITH MICROSCOPIC (ARMC ONLY)
Bacteria, UA: NONE SEEN
Bilirubin Urine: NEGATIVE
Glucose, UA: NEGATIVE mg/dL
Hgb urine dipstick: NEGATIVE
Ketones, ur: NEGATIVE mg/dL
Nitrite: NEGATIVE
Protein, ur: NEGATIVE mg/dL
Specific Gravity, Urine: >1.06 — ABNORMAL HIGH (ref 1.005–1.030)
pH: 5 (ref 5.0–8.0)

## 2014-06-15 LAB — LACTIC ACID, PLASMA: Lactic Acid, Venous: 0.7 mmol/L (ref 0.5–2.0)

## 2014-06-15 MED ORDER — INSULIN ASPART 100 UNIT/ML ~~LOC~~ SOLN: 0.0000 [IU] | SUBCUTANEOUS | Status: DC

## 2014-06-15 MED ORDER — CLINIMIX E/DEXTROSE (5/20) 5 % IV SOLN
INTRAVENOUS | Status: DC
Start: 2014-06-15 — End: 2014-06-15
  Filled 2014-06-15: qty 840

## 2014-06-15 NOTE — Progress Notes (Addendum)
Initial Nutrition Assessment  DOCUMENTATION CODES:     INTERVENTION:   (PN Recommend TPN of 5%AA/20% dextrose at goal rate of 8m/hr. To provide 84 g of protein, 1763 kcals including lipids. Recommend pharmacy consult and checking TPN panel in am. Planning to start at 373mhr today. Discussed with Dr ChBridgett Larssonnd agreeable. )  NUTRITION DIAGNOSIS:  Impaired nutrient utilization related to altered GI function as evidenced by other (see comment) (pt needing TPN).    GOAL:  Patient will meet greater than or equal to 90% of their needs    MONITOR:   (Parenteral nutrition, Electrolyte and renal profile, Glucose profile, Anthropometric)  REASON FOR ASSESSMENT:  Consult New TPN/TNA  ASSESSMENT:  Pt admitted with nausea, vomiting, abdominal pain. PMhx: pt with chronic mesenteric ischemia, COPD, [facility] has G tube but not in use and planning to have removed per pt, GI bleed  Electrolyte and Renal Profile:    Recent Labs Lab 06/14/14 1702 06/15/14 0017  BUN 12  --   CREATININE 0.43* 0.43*  NA 135  --   K 3.9  --    Medications: NS at 10025mr  Pt takes po diet prior to admission.   Height:  Ht Readings from Last 1 Encounters:  06/14/14 '5\' 5"'$  (1.651 m)    Weight: Noted wt on last admission 87 pounds (06/02/2014)  Wt Readings from Last 1 Encounters:  06/14/14 98 lb 4.8 oz (44.589 kg)       Unable to complete Nutrition-Focused physical exam at this time.    Wt Readings from Last 10 Encounters:  06/14/14 98 lb 4.8 oz (44.589 kg)  06/02/14 87 lb 3.2 oz (39.554 kg)    BMI:  Body mass index is 16.36 kg/(m^2).  Estimated Nutritional Needs:  Kcal:  BEE 1155 kcals (IF 1.1-1.3, AF 1.3) [id number]/d. Using IBW of 57kg  Protein:  68-86 g/d (1.2-1.5 g/kg) Using IBW of 57kg  Fluid:  (25-78m31m) 1425-1710ml19mSkin:  Reviewed, no issues  Diet Order:  Diet NPO time specified Except for: Sips with Meds  EDUCATION  NEEDS:  No education needs identified at this time   Intake/Output Summary (Last 24 hours) at 06/15/14 0944 Last data filed at 06/15/14 0842  Gross per 24 hour  Intake 586.67 ml  Output    850 ml  Net -263.33 ml    Last BM:  5/21  HIGH Care Level Juanette Urizar B. [place])

## 2014-06-15 NOTE — Discharge Summary (Signed)
[facility] [place] NAME: [name] [name]    MR#:  [id number]  DATE OF BIRTH:  [birth year]  DATE OF ADMISSION:  06/14/2014 ADMITTING PHYSICIAN: Lytle Butte, MD  DATE OF DISCHARGE: 06/15/2014  PRIMARY CARE PHYSICIAN: MASOUD,JAVED, MD    ADMISSION DIAGNOSIS:  Ischemic bowel disease [K55.9] Generalized abdominal pain [R10.84]   DISCHARGE DIAGNOSIS:   Chronic Ischemic bowel disease [K55.9] Generalized abdominal pain [R10.84]  SECONDARY DIAGNOSIS:   Past Medical History  Diagnosis Date  . COPD (chronic obstructive pulmonary disease)   . Bowel obstruction     x1 month ago    HOSPITAL COURSE:   [name] [name] is a 60 y.o. female with a known history of chronic mesenteric ischemia, COPD presenting with abdominal pain. She describes one day duration of abdominal pain periumbilical in location, abdominal/crampy in quality, 7-8 out of 10 intensity, nonradiating, no worsening/relieving factors. She is a complicated abdominal history including surgery for ischemic bowel, small bowel obstruction, GI bleeds. She is also TPN dependent, however does tolerate by mouth. She also has a G-tube in place which currently serves no function. Emergency department course: Initial evaluation by ED staff CT reveals concern for ischemic bowel, however remainder of labs and physical are reassuring. Case discussed with [facility], where she had the majority of her surgery, who believe there is no emergent surgical issues at this time that warrant transfer.  1. Intestinal angina due to chronic ischemic bowel disease. The patient has been treated with the supportive care including IV fluid hydration and pain medication as required. In addition, she has been treated with the Plavix. Dr. Rexene Edison suggested that it is a possible due to demand ischemia of small bowel due to significant by mouth intake in context of occluded major mesenteric vessels. No acute surgical issues.  The patient abdominal pain has resolved. She denies any abdominal pain, nausea, or vomiting. 2. COPD, unspecified: Stable. Continue Symbicort 3. Tobacco abuse: The patient was counseled for smoking cessation for 3-4 minutes by me.   DISCHARGE CONDITIONS:   Stable  CONSULTS OBTAINED:  Treatment Team:  Lytle Butte, MD Marlyce Huge, MD  DRUG ALLERGIES:   Allergies  Allergen Reactions  . Aspirin Itching    DISCHARGE MEDICATIONS:   Current Discharge Medication List    CONTINUE these medications which have NOT CHANGED   Details  acetaminophen (TYLENOL) 160 MG/5ML solution Take 649.6 mg by mouth every 6 (six) hours as needed for mild pain.    budesonide-formoterol (SYMBICORT) 160-4.5 MCG/ACT inhaler Inhale 2 puffs into the lungs 2 (two) times daily.    cyanocobalamin 1000 MCG tablet Take 2,500 mcg by mouth daily.     fluticasone (FLONASE) 50 MCG/ACT nasal spray Place 2 sprays into both nostrils daily as needed for rhinitis.    HEPARIN LOCK FLUSH IV Inject 5 mLs into the vein daily.    ondansetron (ZOFRAN) 4 MG tablet Take 1 tablet (4 mg total) by mouth every 6 (six) hours as needed for nausea. Qty: 20 tablet, Refills: 0    pantoprazole (PROTONIX) 40 MG tablet Take 40 mg by mouth 2 (two) times daily.    pregabalin (LYRICA) 75 MG capsule Take 75 mg by mouth 2 (two) times daily.    Sodium Chloride Flush (NORMAL SALINE FLUSH IV) Inject 10 mLs into the vein every 12 (twelve) hours.    triamcinolone cream (KENALOG) 0.1 % Apply 1 application topically 2 (two) times daily.    Water For Irrigation, Sterile (  FREE WATER) SOLN Place 60 mLs into feeding tube every 8 (eight) hours. Qty: 60 mL, Refills: 0    albuterol (ACCUNEB) 1.25 MG/3ML nebulizer solution Take 1 ampule by nebulization 3 (three) times daily as needed for wheezing.    alum & mag hydroxide-simeth (MAALOX/MYLANTA) 200-200-20 MG/5ML suspension Take 30 mLs by mouth every 6 (six) hours as needed for indigestion  or heartburn (dyspepsia). Qty: 355 mL, Refills: 0    antiseptic oral rinse (BIOTENE) LIQD Take 5 mLs by mouth as needed for dry mouth.     clopidogrel (PLAVIX) 75 MG tablet Take 75 mg by mouth daily.    cycloSPORINE (RESTASIS) 0.05 % ophthalmic emulsion Place 1 drop into both eyes every 12 (twelve) hours.    dextrose 10 % infusion Inject 70 mL/hr into the vein continuous.    dicyclomine (BENTYL) 10 MG capsule Take 10 mg by mouth 4 (four) times daily as needed for spasms.    ergocalciferol (DRISDOL) 8000 UNIT/ML drops Take 50,400 Units by mouth every 7 (seven) days.    pantoprazole sodium (PROTONIX) 40 mg/20 mL PACK Place 20 mLs (40 mg total) into feeding tube 2 (two) times daily. Qty: 30 each, Refills: 1    trolamine salicylate (ASPERCREME) 10 % cream Apply 1 application topically every 8 (eight) hours as needed (for lower back pain).      STOP taking these medications     potassium chloride (KLOR-CON) 20 MEQ packet      levofloxacin (LEVAQUIN) 500 MG tablet      loperamide (IMODIUM) 2 MG capsule          DISCHARGE INSTRUCTIONS:   Regular diet with TPN. Activity as tolerated.  Smoking cessation.  If you experience worsening of your admission symptoms, develop shortness of breath, life threatening emergency, suicidal or homicidal thoughts you must seek medical attention immediately by calling 911 or calling your MD immediately  if symptoms less severe.  You Must read complete instructions/literature along with all the possible adverse reactions/side effects for all the Medicines you take and that have been prescribed to you. Take any new Medicines after you have completely understood and accept all the possible adverse reactions/side effects.   Please note  You were cared for by a hospitalist during your hospital stay. If you have any questions about your discharge medications or the care you received while you were in the hospital after you are discharged, you can call  the unit and asked to speak with the hospitalist on call if the hospitalist that took care of you is not available. Once you are discharged, your primary care physician will handle any further medical issues. Please note that NO REFILLS for any discharge medications will be authorized once you are discharged, as it is imperative that you return to your primary care physician (or establish a relationship with a primary care physician if you do not have one) for your aftercare needs so that they can reassess your need for medications and monitor your lab values.    Today   SUBJECTIVE   No complaints   VITAL SIGNS:  Blood pressure 119/38, pulse 90, temperature 98.7 F (37.1 C), temperature source Oral, resp. rate 18, height '5\' 5"'$  (1.651 m), weight 44.589 kg (98 lb 4.8 oz), SpO2 100 %.  I/O:   Intake/Output Summary (Last 24 hours) at 06/15/14 1230 Last data filed at 06/15/14 0842  Gross per 24 hour  Intake 586.67 ml  Output    850 ml  Net -263.33 ml  PHYSICAL EXAMINATION:  GENERAL:  59 y.o.-year-old patient lying in the bed with no acute distress. Very thin. EYES: Pupils equal, round, reactive to light and accommodation. No scleral icterus. Extraocular muscles intact.  HEENT: Head atraumatic, normocephalic. Oropharynx and nasopharynx clear.  NECK:  Supple, no jugular venous distention. No thyroid enlargement, no tenderness.  LUNGS: Normal breath sounds bilaterally, no wheezing, rales,rhonchi or crepitation. No use of accessory muscles of respiration.  CARDIOVASCULAR: S1, S2 normal. No murmurs, rubs, or gallops.  ABDOMEN: Soft, mild tenderness in epigastric area, non-distended. Bowel sounds present. No organomegaly or mass.  EXTREMITIES: No pedal edema, cyanosis, or clubbing.  NEUROLOGIC: Cranial nerves II through XII are intact. Muscle strength 5/5 in all extremities. Sensation intact. Gait not checked.  PSYCHIATRIC: The patient is alert and oriented x 3.  SKIN: No obvious rash,  lesion, or ulcer.   DATA REVIEW:   CBC  Recent Labs Lab 06/15/14 0017  WBC 9.2  HGB 10.7*  HCT 32.4*  PLT 405    Chemistries   Recent Labs Lab 06/14/14 1702 06/15/14 0017  NA 135  --   K 3.9  --   CL 100*  --   CO2 29  --   GLUCOSE 114*  --   BUN 12  --   CREATININE 0.43* 0.43*  CALCIUM 8.2*  --   AST 19  --   ALT 24  --   ALKPHOS 88  --   BILITOT 0.3  --     Cardiac Enzymes No results for input(s): TROPONINI in the last 168 hours.  Microbiology Results  Results for orders placed or performed during the hospital encounter of 05/26/14  C difficile quick scan w PCR reflex [facility])     Status: None   Collection Time: 05/27/14  5:05 PM  Result Value Ref Range Status   C Diff antigen NEGATIVE  Final   C Diff toxin NEGATIVE  Final   C Diff interpretation Negative for C. difficile  Final  Culture, blood (routine x 2)     Status: None   Collection Time: 05/31/14  5:51 PM  Result Value Ref Range Status   Specimen Description BLOOD  Final   Special Requests Normal  Final   Culture NO GROWTH 5 DAYS  Final   Report Status 06/05/2014 FINAL  Final  Culture, blood (routine x 2)     Status: None   Collection Time: 05/31/14  5:59 PM  Result Value Ref Range Status   Specimen Description BLOOD  Final   Special Requests Normal  Final   Culture NO GROWTH 5 DAYS  Final   Report Status 06/05/2014 FINAL  Final    RADIOLOGY:  Ct Cta Abd/pel W/cm &/or W/o Cm  06/14/2014   CLINICAL DATA:  Nausea, abdominal pain last night. Recent bowel ischemia and bowel resection.  EXAM: CTA ABDOMEN AND PELVIS wITHOUT AND WITH CONTRAST  TECHNIQUE: Multidetector CT imaging of the abdomen and pelvis was performed using the standard protocol during bolus administration of intravenous contrast. Multiplanar reconstructed images and MIPs were obtained and reviewed to evaluate the vascular anatomy.  CONTRAST:  48m OMNIPAQUE IOHEXOL 350 MG/ML SOLN  COMPARISON:  05/26/2014, 03/17/2014.  FINDINGS: Lower  chest: Lung bases are clear. No effusions. Heart is normal size.  Hepatobiliary: No focal hepatic abnormality. Gallbladder not visualized, question contraction or prior cholecystectomy.  Pancreas: No focal abnormality or ductal dilatation.  Spleen: No focal abnormality.  Normal size.  Adrenals/Urinary Tract: No focal renal or adrenal abnormality. No  hydronephrosis. Urinary bladder is unremarkable.  Stomach/Bowel: Gastrostomy tube in the stomach. There is mucosal enhancement within small bowel loops in the pelvis with mild associated wall thickening and surrounding mesenteric stranding. Multiple air-fluid levels throughout the colon.  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 Inferior mesenteric artery is patent. Single renal arteries are widely patent. Mild atherosclerotic calcifications and plaque in the infrarenal aorta and common iliac arteries. No aneurysm or dissection.  Reproductive: No mass or other significant abnormality.  Other: No free fluid or free air. Prominent left gonadal vein likely reflective of reflux.  Musculoskeletal: No focal bone lesion or acute bony abnormality.  Review of the MIP images confirms the above findings.  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  Mucosal enhancement, wall thickening and mesenteric edema involving multiple small bowel loops in the right lower pelvis. While this could represent infectious enteritis, given occlusion of to mesenteric vessels, I cannot exclude bowel ischemia.   Electronically Signed   By: Rolm Baptise M.D.   On: 06/14/2014 19:38        Management plans discussed with the patient, and she is in agreement.  CODE STATUS:     Code Status Orders        Start     Ordered   06/14/14 2155  Full code   Continuous     06/14/14 2154      TOTAL TIME TAKING CARE OF THIS PATIENT: 42 minutes.    Demetrios Loll M.D on 06/15/2014 at 12:30  PM  Between 7am to 6pm - Pager - [phone]  After 6pm go to [email or web address] - password [email or web address]  Office  [phone]  CC: Primary care physician; Cletis Athens, MD

## 2014-06-15 NOTE — Consult Note (Signed)
CC: Abdominal pain, nausea  HPI: [name] [name] is a pleasant 59 yo F with a history of chronic mesenteric ischemia recently discharged from [facility] s/p significant  Bowel resection and reanastamosis to 2nd portion of duodenum who presents with abdominal pain, now significantly resolved.  Pain x 1 day, crampy, diffuse but worse around umbilicus and G tube site.  Now just some slight pain around G tube site.  + diarrhea, which is normal.  Had what sounds like a significant meal yesterday.  CT shows some stranding and inflammation of small bowel.  Labs normal.  No fevers/chills, night sweats, shortness of breath, cough, chest pain, dysuria/hematuria.  It has been calculated by my partner than [name] [name] probably has approx 70 cm of small bowel remaining.  Patient is on chronic TPN.  Patient Active Problem List   Diagnosis Date Noted  . Ischemic bowel disease 06/14/2014  . Intestinal angina 06/14/2014  . COPD (chronic obstructive pulmonary disease) 06/14/2014  . Protein-calorie malnutrition, severe 05/27/2014  . Abdominal pain 05/26/2014   Allergies  Allergen Reactions  . Aspirin Itching   History   Social History  . Marital Status: Widowed    Spouse Name: N/A  . Number of Children: N/A  . Years of Education: N/A   Occupational History  . Not on file.   Social History Main Topics  . Smoking status: Current Every Day Smoker -- 0.50 packs/day    Types: Cigarettes  . Smokeless tobacco: Not on file  . Alcohol Use: No  . Drug Use: No  . Sexual Activity: Not on file   Other Topics Concern  . Not on file   Social History Narrative   Family History  Problem Relation Age of Onset  . Heart failure Neg Hx   . Diabetes Neg Hx    ROS: Full ROS obtained, pertinent positives and negatives as above  Blood pressure 119/38, pulse 90, temperature 98.7 F (37.1 C), temperature source Oral, resp. rate 18, height '5\' 5"'$  (1.651 m), weight 44.589 kg (98 lb 4.8 oz), SpO2 100 %.  GEN: NAD/A&Ox3, thin HEAD:  [place]/AT FACE: No obvious facial trauma, normal external nose and ears EYES: no scleral icterus, no conjunctivitis CV: RRR, no MRG RESP: moving air well, lungs clear bilaterally ABD: soft, min tender, nondistended  Labs: I have personally reviewed all labs.  Significant for WBC 9.5 at presentation with 51% N Lactate 0.7  CT: I have personally reviewed CT scans.  Noted small bowel thickening.  A/P 59 yo F with complicated past, short gut syndrome on TPN.  Occlusion of Celiac and SMA.  I favor either enteritis or demand ischemia of small bowel due to significant PO intake in context of occluded major mesenteric vessels.  I would suspect the latter as her pain has improved with admission and hydration.  No acute surgical issues.

## 2014-06-15 NOTE — Progress Notes (Signed)
PARENTERAL NUTRITION CONSULT NOTE - INITIAL  Pharmacy Consult for Electrolyte and Glucose Management Indication: Bowel resection/chronic TPN  Allergies  Allergen Reactions  . Aspirin Itching    Patient Measurements: Height: '5\' 5"'$  (165.1 cm) Weight: 98 lb 4.8 oz (44.589 kg) IBW/kg (Calculated) : 57 Adjusted Body Weight:  Usual Weight:   Vital Signs: Temp: 98.7 F (37.1 C) (05/22 0508) Temp Source: Oral (05/22 0508) BP: 119/38 mmHg (05/22 0508) Pulse Rate: 90 (05/22 0508) Intake/Output from previous day: 05/21 0701 - 05/22 0700 In: 586.7 [I.V.:586.7] Out: 650 [Urine:650] Intake/Output from this shift: Total I/O In: -  Out: 200 [Urine:200]  Labs:  Recent Labs  06/14/14 1702 06/15/14 0017  WBC 9.5 9.2  HGB 11.5* 10.7*  HCT 35.8 32.4*  PLT 440 405     Recent Labs  06/14/14 1702 06/15/14 0017  NA 135  --   K 3.9  --   CL 100*  --   CO2 29  --   GLUCOSE 114*  --   BUN 12  --   CREATININE 0.43* 0.43*  CALCIUM 8.2*  --   PROT 6.3*  --   ALBUMIN 2.7*  --   AST 19  --   ALT 24  --   ALKPHOS 88  --   BILITOT 0.3  --    Estimated Creatinine Clearance: 54.6 mL/min (by C-G formula based on Cr of 0.43).   No results for input(s): GLUCAP in the last 72 hours.  Medical History: Past Medical History  Diagnosis Date  . COPD (chronic obstructive pulmonary disease)   . Bowel obstruction     x1 month ago    Medications:  Scheduled:  . insulin aspart  0-9 Units Subcutaneous 6 times per day    Insulin Requirements in the past 24 hours:  0 units thus far  Current Nutrition:  TPN  Assessment: Electrolytes and glucose normal as of 5/21.   Nutritional Goals:   Plan:  Will continue Clinimix E 5/20 @ 35 ml/hr. SSI q4 hours has been ordered. BMP ordered with am labs.   Larene Beach, PharmD  06/15/2014,1:49 PM

## 2014-06-15 NOTE — Plan of Care (Signed)
Problem: Discharge Progression Outcomes Goal: Discharge plan in place and appropriate Individualization: Pt is from home and is followed by [facility]. Pt had TPN infusing at time of admission, pt states infusing X's 18 hours and then off for 6hours.  Pt also states that she does have PO intake also. Pt has Gtube in place, pt states that this is not in use. Moderate fall risk, bed alarm activated fore patient safety; Ambulates with cane for balance.  Pt admitted with Nausea/Vomiting/ and abd. Pain, IVF infusing per MD order. PMH: COPD and bowel obstruction. Pt is a current smoker.  Goal: Other Discharge Outcomes/Goals Plan of care progress to goal:  Pain: Pt c/o abd pain, improved with PRN IV pain medications.  Hemo: IVF infusing per MD order, no nausea and no vomiting noted this shift. Diet: pt currently NPO sips only with medications. TPN stopped for 6hours. Activitiy: Moderate fall risk, up to BR/BSC with assistance.

## 2014-06-15 NOTE — Progress Notes (Signed)
Dr. Marcille Blanco notified of possible duplicate labs, one set from 06/14/2014 on arrival to the ED and then another set just after arrival to room 126. New order obtained to d/c AM labs, duplicate. Also pt has TPN that is to infuse 18 hours and then off for 6hours. New order obtained for Dietary Consult for management of TPN.

## 2014-06-15 NOTE — Discharge Instructions (Signed)
Regular diet with TPN. Activity as tolerated.  Smoking cessation.

## 2014-07-10 ENCOUNTER — Encounter: Payer: Self-pay | Admitting: Emergency Medicine

## 2014-07-10 ENCOUNTER — Emergency Department
Admission: EM | Admit: 2014-07-10 | Discharge: 2014-07-11 | Disposition: A | Discharge status: Home / Self Care | Payer: Medicare Other | Attending: Emergency Medicine | Admitting: Emergency Medicine

## 2014-07-10 DIAGNOSIS — Z79899 Other long term (current) drug therapy: Secondary | ICD-10-CM | POA: Diagnosis not present

## 2014-07-10 DIAGNOSIS — Z7951 Long term (current) use of inhaled steroids: Secondary | ICD-10-CM | POA: Insufficient documentation

## 2014-07-10 DIAGNOSIS — Z720 Tobacco use: Secondary | ICD-10-CM | POA: Diagnosis not present

## 2014-07-10 DIAGNOSIS — Z7902 Long term (current) use of antithrombotics/antiplatelets: Secondary | ICD-10-CM | POA: Diagnosis not present

## 2014-07-10 DIAGNOSIS — Z7952 Long term (current) use of systemic steroids: Secondary | ICD-10-CM | POA: Insufficient documentation

## 2014-07-10 DIAGNOSIS — R1084 Generalized abdominal pain: Secondary | ICD-10-CM | POA: Diagnosis not present

## 2014-07-10 LAB — BASIC METABOLIC PANEL
Anion gap: 5 (ref 5–15)
BUN: 13 mg/dL (ref 6–20)
CO2: 29 mmol/L (ref 22–32)
Calcium: 8.3 mg/dL — ABNORMAL LOW (ref 8.9–10.3)
Chloride: 101 mmol/L (ref 101–111)
Creatinine, Ser: 0.46 mg/dL (ref 0.44–1.00)
GFR calc Af Amer: >60 mL/min (ref >60)
GFR calc non Af Amer: >60 mL/min (ref >60)
Glucose, Bld: 150 mg/dL — ABNORMAL HIGH (ref 65–99)
Potassium: 4 mmol/L (ref 3.5–5.1)
Sodium: 135 mmol/L (ref 135–145)

## 2014-07-10 LAB — CBC WITH DIFFERENTIAL/PLATELET
Basophils Absolute: 0.4 10*3/uL — ABNORMAL HIGH (ref 0–0.1)
Basophils Relative: 4 %
Eosinophils Absolute: 0.1 10*3/uL (ref 0–0.7)
Eosinophils Relative: 1 %
HCT: 39.7 % (ref 35.0–47.0)
Hemoglobin: 12.7 g/dL (ref 12.0–16.0)
Lymphocytes Relative: 18 %
Lymphs Abs: 1.6 10*3/uL (ref 1.0–3.6)
MCH: 30.7 pg (ref 26.0–34.0)
MCHC: 32 g/dL (ref 32.0–36.0)
MCV: 96.1 fL (ref 80.0–100.0)
Monocytes Absolute: 0.5 10*3/uL (ref 0.2–0.9)
Monocytes Relative: 6 %
Neutro Abs: 6.4 10*3/uL (ref 1.4–6.5)
Neutrophils Relative %: 71 %
Platelets: 381 10*3/uL (ref 150–440)
RBC: 4.13 MIL/uL (ref 3.80–5.20)
RDW: 14.9 % — ABNORMAL HIGH (ref 11.5–14.5)
WBC: 8.9 10*3/uL (ref 3.6–11.0)

## 2014-07-10 MED ORDER — HYDROMORPHONE HCL 1 MG/ML IJ SOLN
1.0000 mg | Freq: Once | INTRAMUSCULAR | Status: AC
Start: 2014-07-11 — End: 2014-07-11
  Administered 2014-07-11: 1 mg via INTRAVENOUS

## 2014-07-10 MED ORDER — ONDANSETRON HCL 4 MG/2ML IJ SOLN
4.0000 mg | Freq: Once | INTRAMUSCULAR | Status: AC
  Administered 2014-07-11: 4 mg via INTRAVENOUS

## 2014-07-10 MED ORDER — SODIUM CHLORIDE 0.9 % IV BOLUS (SEPSIS)
1000.0000 mL | Freq: Once | INTRAVENOUS | Status: AC
Start: 2014-07-11 — End: 2014-07-11
  Administered 2014-07-11: 1000 mL via INTRAVENOUS

## 2014-07-10 NOTE — ED Notes (Signed)
Pt presents to ED via EMS with c/o of abdominal pain localized in umbilical region. EMS states pt had an intestinal removal surgery approx. x2 months ago at [facility]. EMS states pt had been experiencing presenting sx for the past couple days, worsening today. Pain is localized, tender to touch in affected area, description as "burning". EMS states incision site is healing appropriately. EMS states pt has also been experiencing diarrhea ever since surgery. EMS states pt is currently taking TPN and PEG line in place. Pt arrived to treatment room alert and oriented x4.

## 2014-07-11 ENCOUNTER — Emergency Department: Payer: Medicare Other

## 2014-07-11 DIAGNOSIS — R1084 Generalized abdominal pain: Secondary | ICD-10-CM | POA: Diagnosis not present

## 2014-07-11 LAB — LACTIC ACID, PLASMA: Lactic Acid, Venous: 1 mmol/L (ref 0.5–2.0)

## 2014-07-11 LAB — TROPONIN I: Troponin I: <0.03 ng/mL (ref <0.031)

## 2014-07-11 MED ORDER — MORPHINE SULFATE 4 MG/ML IJ SOLN
4.0000 mg | Freq: Once | INTRAMUSCULAR | Status: AC
  Administered 2014-07-11: 4 mg via INTRAVENOUS

## 2014-07-11 MED ORDER — IOHEXOL 240 MG/ML SOLN
50.0000 mL | Freq: Once | INTRAMUSCULAR | Status: AC | PRN
  Administered 2014-07-11: 50 mL via ORAL

## 2014-07-11 MED ORDER — HYDROMORPHONE HCL 1 MG/ML IJ SOLN
INTRAMUSCULAR | Status: AC
  Administered 2014-07-11: 1 mg via INTRAVENOUS
  Filled 2014-07-11: qty 1

## 2014-07-11 MED ORDER — GI COCKTAIL ~~LOC~~
30.0000 mL | Freq: Once | ORAL | Status: AC
  Administered 2014-07-11: 30 mL via ORAL

## 2014-07-11 MED ORDER — IOHEXOL 300 MG/ML  SOLN
75.0000 mL | Freq: Once | INTRAMUSCULAR | Status: AC | PRN
  Administered 2014-07-11: 75 mL via INTRAVENOUS

## 2014-07-11 MED ORDER — SODIUM CHLORIDE 0.9 % IV BOLUS (SEPSIS)
1000.0000 mL | Freq: Once | INTRAVENOUS | Status: AC
  Administered 2014-07-11: 1000 mL via INTRAVENOUS

## 2014-07-11 MED ORDER — ONDANSETRON HCL 4 MG/2ML IJ SOLN
INTRAMUSCULAR | Status: AC
  Administered 2014-07-11: 4 mg via INTRAVENOUS
  Filled 2014-07-11: qty 2

## 2014-07-11 MED ORDER — MORPHINE SULFATE 4 MG/ML IJ SOLN
INTRAMUSCULAR | Status: AC
  Administered 2014-07-11: 4 mg via INTRAVENOUS
  Filled 2014-07-11: qty 1

## 2014-07-11 MED ORDER — GI COCKTAIL ~~LOC~~
ORAL | Status: AC
  Administered 2014-07-11: 30 mL via ORAL
  Filled 2014-07-11: qty 30

## 2014-07-11 NOTE — ED Notes (Signed)
Pt alert and oriented X4, active, cooperative, pt in NAD. RR even and unlabored, color WNL.  Pt informed to return if any life threatening symptoms occur.  Leaving with peer support-Melissa Hassell Done

## 2014-07-11 NOTE — Discharge Instructions (Signed)

## 2014-07-11 NOTE — ED Provider Notes (Signed)
[facility] Emergency Department Provider Note  ____________________________________________  Time seen: Approximately 1140pm  I have reviewed the triage vital signs and the nursing notes.   HISTORY  Chief Complaint Abdominal Pain    HPI [name] [name] is a 59 y.o. female with a history of bowel obstruction with resection 2 months ago at [facility] presents with abdominal pain since this morning. Patient says that she has pain in the mid abdomen under her incision which has been worsening since this morning. She has not passed any gas. She has been vomiting nonbloody vomitus. Also nonbilious. Pain is been constant and is progressed to severe pain. Pain is sharp.   Past Medical History  Diagnosis Date  . COPD (chronic obstructive pulmonary disease)   . Bowel obstruction     x1 month ago    Patient Active Problem List   Diagnosis Date Noted  . Ischemic bowel disease 06/14/2014  . Intestinal angina 06/14/2014  . COPD (chronic obstructive pulmonary disease) 06/14/2014  . Protein-calorie malnutrition, severe 05/27/2014  . Abdominal pain 05/26/2014    History reviewed. No pertinent past surgical history.  Current Outpatient Rx  Name  Route  Sig  Dispense  Refill  . acetaminophen (TYLENOL) 160 MG/5ML solution   Oral   Take 160 mg by mouth every 6 (six) hours as needed for mild pain.          Marland Kitchen albuterol (ACCUNEB) 1.25 MG/3ML nebulizer solution   Nebulization   Take 1 ampule by nebulization 3 (three) times daily as needed for wheezing.         . budesonide-formoterol (SYMBICORT) 160-4.5 MCG/ACT inhaler   Inhalation   Inhale 2 puffs into the lungs 2 (two) times daily.         . clopidogrel (PLAVIX) 75 MG tablet   Oral   Take 75 mg by mouth daily.         . cycloSPORINE (RESTASIS) 0.05 % ophthalmic emulsion   Both Eyes   Place 1 drop into both eyes every 12 (twelve) hours.         . fluticasone (FLONASE) 50 MCG/ACT nasal spray   Each Nare   Place 2 sprays into both nostrils daily as needed for rhinitis.         Marland Kitchen HEPARIN LOCK FLUSH IV   Intravenous   Inject 5 mLs into the vein daily.         . ondansetron (ZOFRAN) 4 MG tablet   Oral   Take 1 tablet (4 mg total) by mouth every 6 (six) hours as needed for nausea.   20 tablet   0   . pregabalin (LYRICA) 75 MG capsule   Oral   Take 75 mg by mouth 2 (two) times daily.         . Sodium Chloride Flush (NORMAL SALINE FLUSH IV)   Intravenous   Inject 10 mLs into the vein every 12 (twelve) hours.         . vitamin B-12 (CYANOCOBALAMIN) 1000 MCG tablet   Oral   Take 1,000 mcg by mouth 2 (two) times daily.         . Water For Irrigation, Sterile (FREE WATER) SOLN   Per Tube   Place 60 mLs into feeding tube every 8 (eight) hours.   60 mL   0   . alum & mag hydroxide-simeth (MAALOX/MYLANTA) 200-200-20 MG/5ML suspension   Oral   Take 30 mLs by mouth every 6 (six) hours as needed for  indigestion or heartburn (dyspepsia). Patient not taking: Reported on 06/14/2014   355 mL   0   . pantoprazole sodium (PROTONIX) 40 mg/20 mL PACK   Per Tube   Place 20 mLs (40 mg total) into feeding tube 2 (two) times daily. Patient not taking: Reported on 06/14/2014   30 each   1     Allergies Aspirin  Family History  Problem Relation Age of Onset  . Heart failure Neg Hx   . Diabetes Neg Hx     Social History History  Substance Use Topics  . Smoking status: Current Every Day Smoker -- 0.50 packs/day    Types: Cigarettes  . Smokeless tobacco: Not on file  . Alcohol Use: No    Review of Systems Constitutional: No fever/chills Eyes: No visual changes. ENT: No sore throat. Cardiovascular: Denies chest pain. Respiratory: Denies shortness of breath. Gastrointestinal: As above  Genitourinary: Negative for dysuria. Musculoskeletal: Negative for back pain. Skin: Negative for rash. Neurological: Negative for headaches, focal weakness or numbness.  10-point ROS  otherwise negative.  ____________________________________________   PHYSICAL EXAM:  VITAL SIGNS: ED Triage Vitals  Enc Vitals Group     BP 07/10/14 2148 169/78 mmHg     Pulse Rate 07/10/14 2148 120     Resp 07/10/14 2148 13     Temp 07/10/14 2148 98.1 F (36.7 C)     Temp Source 07/10/14 2148 Oral     SpO2 07/10/14 2148 100 %     Weight 07/10/14 2148 83 lb (37.649 kg)     Height 07/10/14 2148 '5\' 6"'$  (1.676 m)     Head Cir --      Peak Flow --      Pain Score 07/10/14 2148 10     Pain Loc --      Pain Edu? --      Excl. in [place]? --     Constitutional: Alert and oriented. Take with mild to moderate distress. Holding belly.  Eyes: Conjunctivae are normal. PERRL. EOMI. Head: Atraumatic. Nose: No congestion/rhinnorhea. Mouth/Throat: Mucous membranes are moist.  Oropharynx non-erythematous. Neck: No stridor.   Cardiovascular: Normal rate, regular rhythm. Grossly normal heart sounds.  Good peripheral circulation. Respiratory: Normal respiratory effort.  No retractions. Lungs CTAB. Gastrointestinal: Soft and tender diffusely. No distention. Well-healed vertical incision. No dehiscence, induration or pus.  Musculoskeletal: No lower extremity tenderness nor edema.  No joint effusions. Neurologic:  Normal speech and language. No gross focal neurologic deficits are appreciated. Speech is normal. No gait instability. Skin:  Skin is warm, dry and intact. No rash noted. Psychiatric: Mood and affect are normal. Speech and behavior are normal.  ____________________________________________   LABS (all labs ordered are listed, but only abnormal results are displayed)  Labs Reviewed  BASIC METABOLIC PANEL - Abnormal; Notable for the following:    Glucose, Bld 150 (*)    Calcium 8.3 (*)    All other components within normal limits  CBC WITH DIFFERENTIAL/PLATELET - Abnormal; Notable for the following:    RDW 14.9 (*)    Basophils Absolute 0.4 (*)    All other components within normal  limits  URINALYSIS COMPLETEWITH MICROSCOPIC ([facility] ONLY)   ____________________________________________  EKG  ED ECG REPORT I, Doran Stabler, the attending physician, personally viewed and interpreted this ECG.   Date: 07/11/2014  EKG Time: 2154  Rate: 103  Rhythm: sinus tachycardia  Axis: Normal axis  Intervals:none  ST&T Change: T-wave inversion in aVL. No STEMI.  ____________________________________________  RADIOLOGY  Persistent in the vascular occlusion involving the celiac axis and superior mesenteric artery with persistent segmental wall thickening throughout the ileum suggesting ischemic bowel or enteritis. Similar appearance to previous study. ____________________________________________   PROCEDURES    ____________________________________________   INITIAL IMPRESSION / ASSESSMENT AND PLAN / ED COURSE  Pertinent labs & imaging results that were available during my care of the patient were reviewed by me and considered in my medical decision making (see chart for details).  ----------------------------------------- 5:46 AM on 07/11/2014 -----------------------------------------  Patient resting comfortably with heart rate 96 at rest. Tolerated by mouth fluids but says cannot tolerate by mouth graham crackers and says that this is an ongoing issue for her. Case with Dr. Pat Patrick who said that the patient is likely to have ongoing pain.  The CAT scan is unchanged and the lactic acid is normal. Patient says that she is compliant with her Plavix. Says Tylenol usually works for pain control at home except with exacerbations. We'll discharge patient home.  Patient says will call her surgeon at [facility] when the office opens later this morning to schedule an urgent follow-up appointment to be seen within 3 days. Daily the patient will be seen this coming Monday. Pain likely related to ongoing ischemic bowel. ____________________________________________   FINAL CLINICAL  IMPRESSION(S) / ED DIAGNOSES  Abdominal pain. Acute on chronic. Return visit.    Orbie Pyo, MD 07/11/14 [phone]

## 2014-09-09 ENCOUNTER — Telehealth: Payer: Self-pay

## 2014-09-09 NOTE — Telephone Encounter (Signed)
A resident from [facility] called stating that this patient has been admitted for Bacteremia secondary to a PICC line infection. Pt has PICC line for Chronic TPN. He would like [place] to manage TPN post-discharge.  I explained that we have not seen patient since May of 2016 and our office does not handle the dosing of TPN. Resident states that Dr. Dellia Beckwith (PCP) was doing this prior to admission but does not want to continue this post-admission. I told this resident that we are a surgical practice and do not feel that we could not manage dosing of TPN and will not take this on at this time.  Call made to Dr. Joesph July office [phone] and explained that we would not be following up with TPN orders. She states that she will let Dr. Dellia Beckwith know.

## 2015-07-24 IMAGING — CT CT ABD-PELV W/ CM
1 of 4 series · 13 of 32 positions shown, 19 images · IV contrast (omnipaque)
Comparison: 06/14/2014

CLINICAL DATA: Umbilical abdominal pain. Intestinal removal surgery
about 2 months ago at [HOSPITAL]. Diarrhea since the surgery.

EXAM:
CT ABDOMEN AND PELVIS WITH CONTRAST
TECHNIQUE: Multidetector CT imaging of the abdomen and pelvis was performed
using the standard protocol following bolus administration of
intravenous contrast.
CONTRAST:  75mL OMNIPAQUE IOHEXOL 300 MG/ML  SOLN

[Series 2: routine abd pel with · axial · 0.60mm/px · z∈[-436,-96]mm · 13 of 80 slices shown, 19 images]
[im 6/80  soft-tissue]
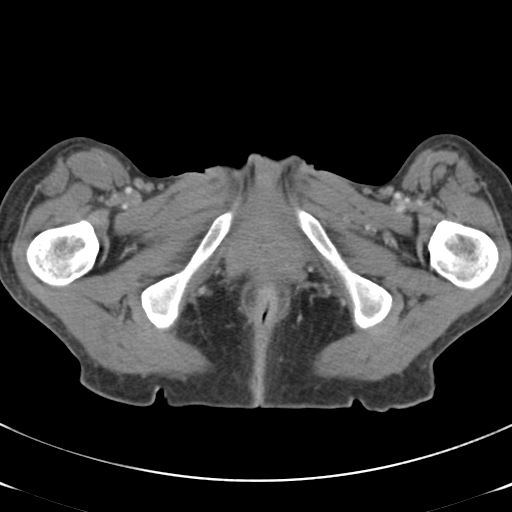
[im 6/80  bone]
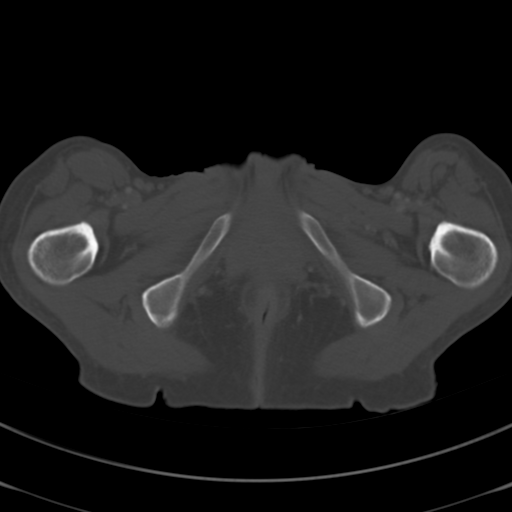
[im 12/80  soft-tissue]
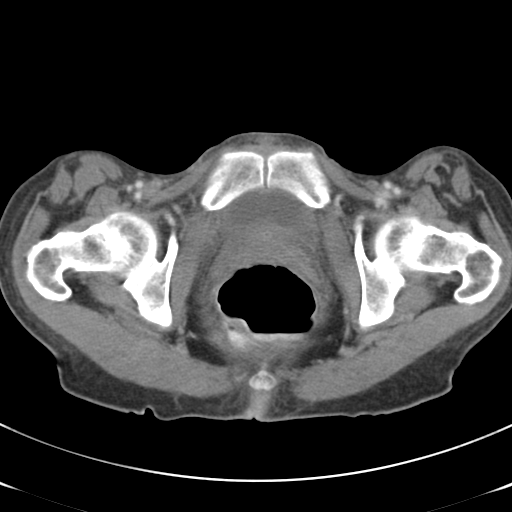
[im 17/80  soft-tissue]
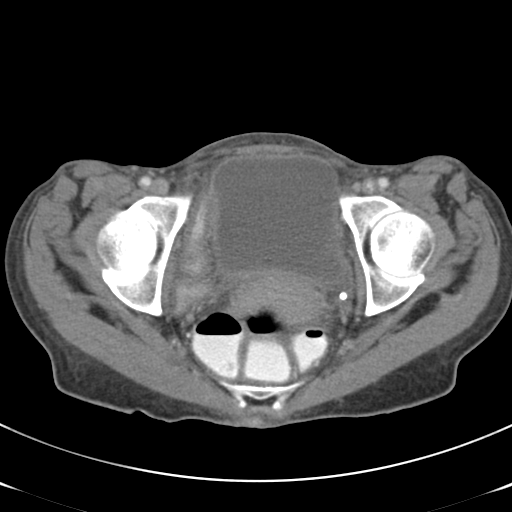
[im 23/80  soft-tissue]
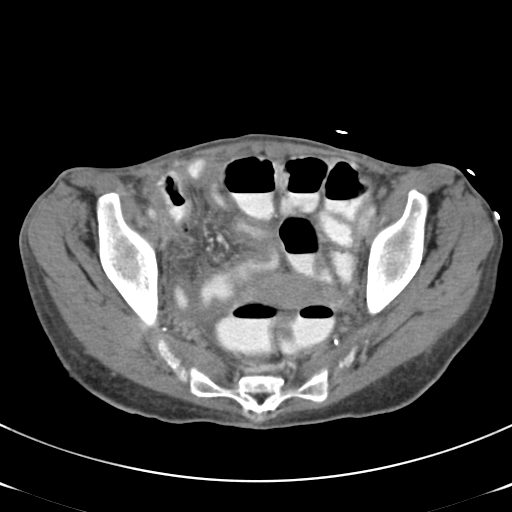
[im 29/80  soft-tissue]
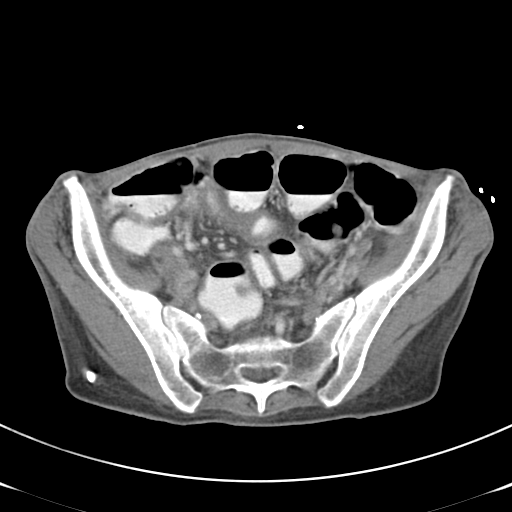
[im 34/80  soft-tissue]
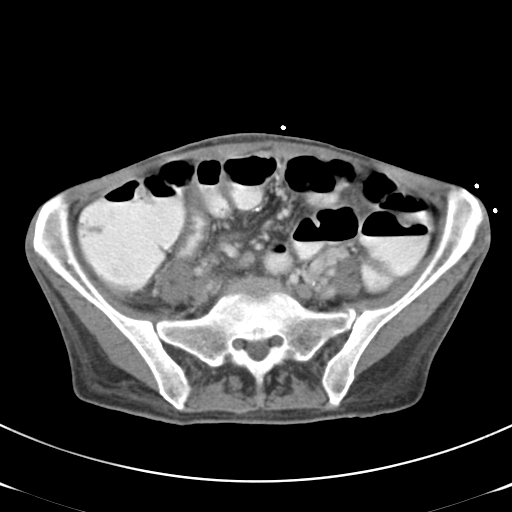
[im 40/80  soft-tissue]
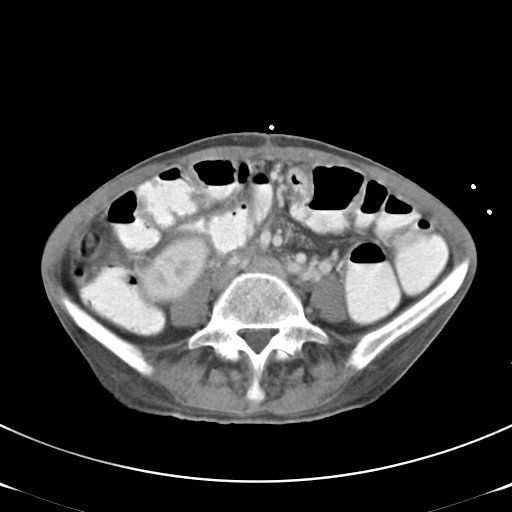
[im 46/80  soft-tissue]
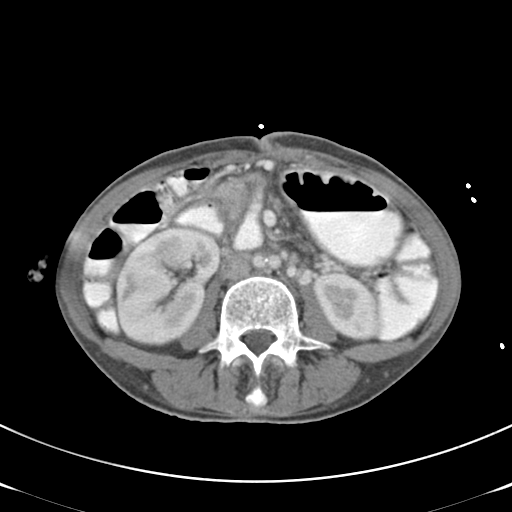
[im 51/80  soft-tissue]
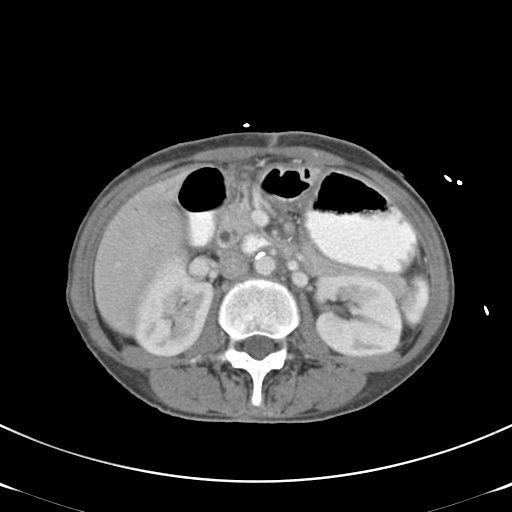
[im 51/80  bone]
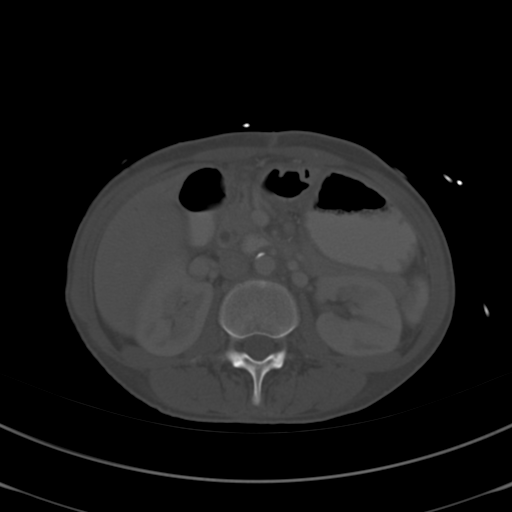
[im 57/80  soft-tissue]
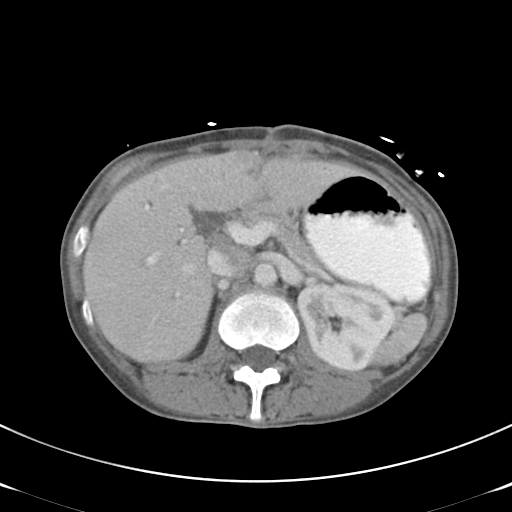
[im 57/80  lung]
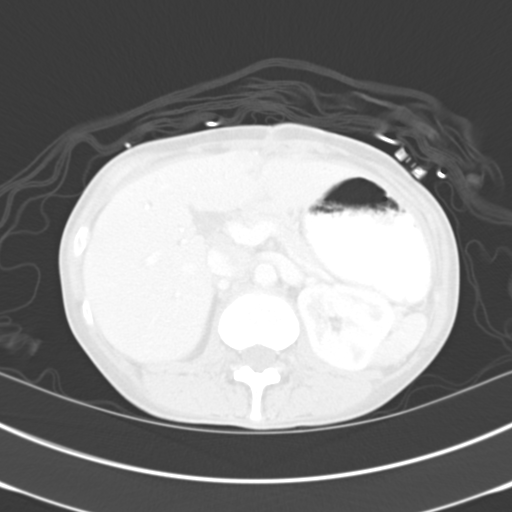
[im 63/80  soft-tissue]
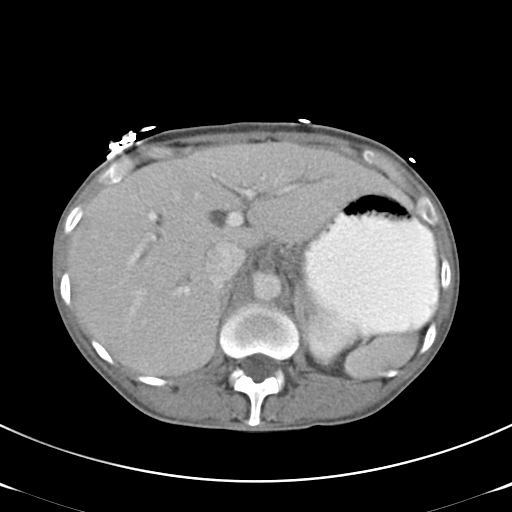
[im 63/80  lung]
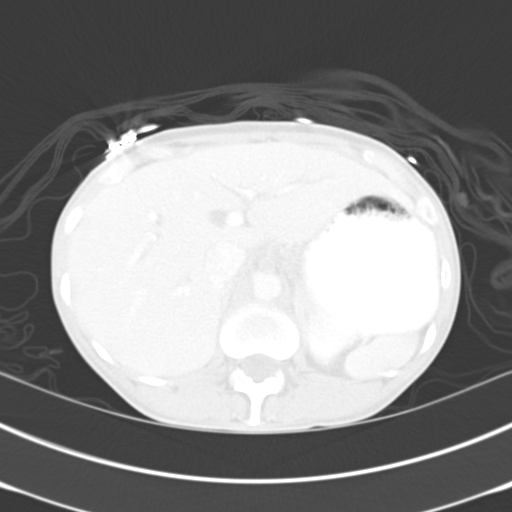
[im 68/80  soft-tissue]
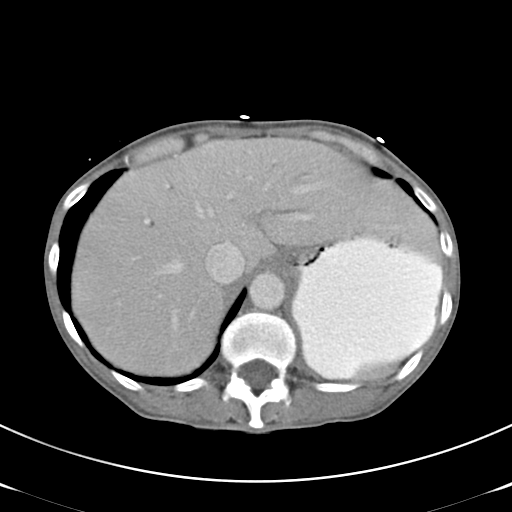
[im 68/80  lung]
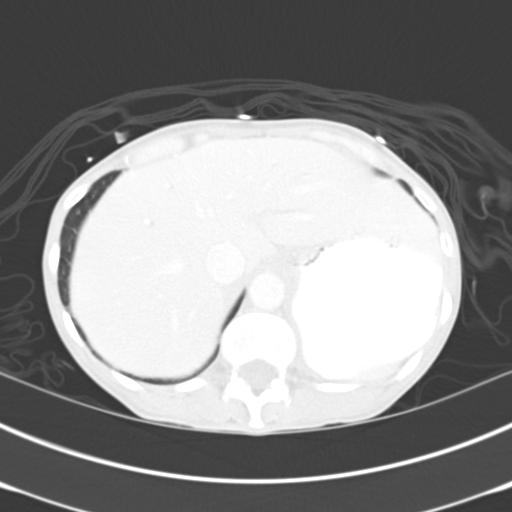
[im 74/80  soft-tissue]
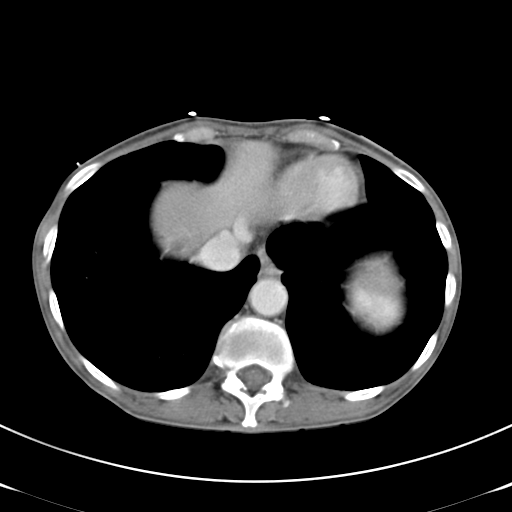
[im 74/80  lung]
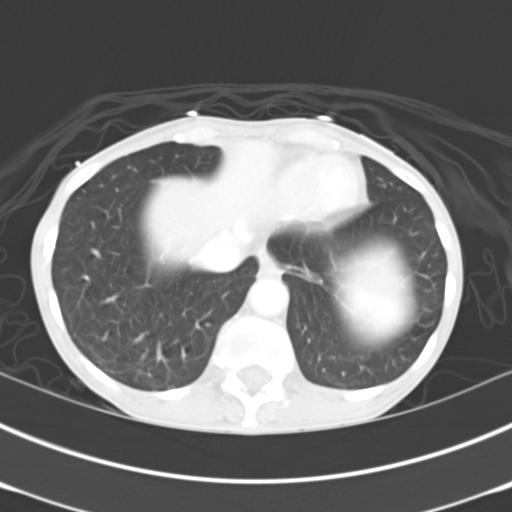

[13 of 32 positions shown; findings below may reference images not displayed]

FINDINGS: Mild dependent atelectasis in the lung bases.

There is proximal occlusion of the celiac axis and superior
mesenteric artery. Inferior mesenteric artery is patent. Multiple
left gonadal vein varices. Distal and terminal ileum demonstrate
diffuse wall thickening with mesenteric edema, similar to prior
study. Residual ischemic bowel or recurrent enteritis could have
this appearance. Contrast material flows through to the rectum
without evidence of small bowel or colonic obstruction. Colon is not
abnormally distended and no colonic wall thickening is appreciated.
No free or loculated pelvic fluid collections. Gastrostomy tube has
been removed in the interval.

Surgical absence of the gallbladder. Mild bile duct dilatation is
likely normal for postoperative physiology. No focal liver lesions.
The pancreas, spleen, adrenal glands, inferior vena cava, and
retroperitoneal lymph nodes are unremarkable. Calcification of aorta
without aneurysm. Subcentimeter cysts in the right kidney. No
hydronephrosis in either kidney.

Pelvis: Uterus and ovaries are not enlarged. No pelvic mass or
lymphadenopathy. Bladder wall is not thickened. No destructive bone
lesions.
IMPRESSION: Persistent changes of vascular occlusion involving the celiac axis
and superior mesenteric artery with persistent segmental wall
thickening throughout the ileum suggesting ischemic bowel or
enteritis. Similar appearance to previous study.

## 2016-01-10 ENCOUNTER — Emergency Department
Admission: EM | Admit: 2016-01-10 | Discharge: 2016-01-10 | Disposition: A | Discharge status: Home / Self Care | Payer: Medicare Other | Attending: Emergency Medicine | Admitting: Emergency Medicine

## 2016-01-10 ENCOUNTER — Encounter: Payer: Self-pay | Admitting: Emergency Medicine

## 2016-01-10 DIAGNOSIS — F17210 Nicotine dependence, cigarettes, uncomplicated: Secondary | ICD-10-CM | POA: Diagnosis not present

## 2016-01-10 DIAGNOSIS — J449 Chronic obstructive pulmonary disease, unspecified: Secondary | ICD-10-CM | POA: Diagnosis not present

## 2016-01-10 DIAGNOSIS — Z79899 Other long term (current) drug therapy: Secondary | ICD-10-CM | POA: Diagnosis not present

## 2016-01-10 DIAGNOSIS — J069 Acute upper respiratory infection, unspecified: Secondary | ICD-10-CM | POA: Diagnosis not present

## 2016-01-10 DIAGNOSIS — R05 Cough: Secondary | ICD-10-CM | POA: Diagnosis present

## 2016-01-10 MED ORDER — BENZONATATE 100 MG PO CAPS: 100.0000 mg | ORAL_CAPSULE | Freq: Three times a day (TID) | ORAL | 0 refills | Status: AC | PRN

## 2016-01-10 NOTE — ED Notes (Signed)
See triage note states she developed cough and congestion last weds    Also c/o headache  No fever but has sinus pressure and congestion  Low grade temp on arrival

## 2016-01-10 NOTE — ED Triage Notes (Signed)
Patient brought in by [facility] from home for headache and cough. Patient states that she has had her symptoms for the past 4 days. Patient also has nasal congestion for about 1 week. Patient states that she has been running fever at home also. Patient states that she has not taken any OTC med for her symptoms. Patient is currently in NAD, breathing equal and unlabored.

## 2016-01-10 NOTE — ED Provider Notes (Signed)
[facility] Emergency Department Provider Note  ____________________________________________  Time seen: Approximately 12:36 PM  I have reviewed the triage vital signs and the nursing notes.   HISTORY  Chief Complaint Headache and Cough    HPI [name] [name] is a 60 y.o. female that presents to the emergency department with cough, congestion, headache and body aches for 4 days. Patient is coughing up clear sputum. Patient states that she's had a low-grade fever at home. Patient has sinus pressure and the congestion is giving her a headache. Headache feels like a band that wrapping around her head and feels like a normal headache. Headache is coming and going. Patient was taking Flonase, which helped with congestion but patient did not take any yesterday or today. Patient has not tried any over-the-counter medications for symptoms. She denies abdominal pain, vomiting, shortness of breath, chest pain.   Past Medical History:  Diagnosis Date  . Bowel obstruction    x1 month ago  . COPD (chronic obstructive pulmonary disease) [facility])     Patient Active Problem List   Diagnosis Date Noted  . Ischemic bowel disease ([place]) 06/14/2014  . Intestinal angina ([place]) 06/14/2014  . COPD (chronic obstructive pulmonary disease) ([place]) 06/14/2014  . Protein-calorie malnutrition, severe ([place]) 05/27/2014  . Abdominal pain 05/26/2014    Past Surgical History:  Procedure Laterality Date  . COLON SURGERY      Prior to Admission medications   Medication Sig Start Date End Date Taking? Authorizing Provider  acetaminophen (TYLENOL) 160 MG/5ML solution Take 160 mg by mouth every 6 (six) hours as needed for mild pain.     Historical Provider, MD  albuterol (ACCUNEB) 1.25 MG/3ML nebulizer solution Take 1 ampule by nebulization 3 (three) times daily as needed for wheezing.    Historical Provider, MD  benzonatate (TESSALON PERLES) 100 MG capsule Take 1 capsule (100 mg total) by mouth  3 (three) times daily as needed for cough. 01/10/16 01/09/17  Laban Emperor, PA-C  budesonide-formoterol (SYMBICORT) 160-4.5 MCG/ACT inhaler Inhale 2 puffs into the lungs 2 (two) times daily.    Historical Provider, MD  clopidogrel (PLAVIX) 75 MG tablet Take 75 mg by mouth daily.    Historical Provider, MD  cycloSPORINE (RESTASIS) 0.05 % ophthalmic emulsion Place 1 drop into both eyes every 12 (twelve) hours.    Historical Provider, MD  fluticasone (FLONASE) 50 MCG/ACT nasal spray Place 2 sprays into both nostrils daily as needed for rhinitis.    Historical Provider, MD  HEPARIN LOCK FLUSH IV Inject 5 mLs into the vein daily.    Historical Provider, MD  ondansetron (ZOFRAN) 4 MG tablet Take 1 tablet (4 mg total) by mouth every 6 (six) hours as needed for nausea. 05/29/14   Fritzi Mandes, MD  pregabalin (LYRICA) 75 MG capsule Take 75 mg by mouth 2 (two) times daily.    Historical Provider, MD  Sodium Chloride Flush (NORMAL SALINE FLUSH IV) Inject 10 mLs into the vein every 12 (twelve) hours.    Historical Provider, MD  vitamin B-12 (CYANOCOBALAMIN) 1000 MCG tablet Take 1,000 mcg by mouth 2 (two) times daily.    Historical Provider, MD  Water For Irrigation, Sterile (FREE WATER) SOLN Place 60 mLs into feeding tube every 8 (eight) hours. 05/29/14   Fritzi Mandes, MD    Allergies Aspirin  Family History  Problem Relation Age of Onset  . Heart failure Neg Hx   . Diabetes Neg Hx     Social History Social History  Substance  Use Topics  . Smoking status: Current Every Day Smoker    Packs/day: 0.50    Types: Cigarettes  . Smokeless tobacco: Never Used  . Alcohol use No     Review of Systems  Constitutional: No fever/chills ENT: No upper respiratory complaints. Cardiovascular: No chest pain. Respiratory: No cough. No SOB. Gastrointestinal: No abdominal pain.  No nausea, no vomiting.  Genitourinary: Negative for dysuria. Musculoskeletal: Negative for musculoskeletal pain. Skin: Negative for rash,  abrasions, lacerations, ecchymosis.    ____________________________________________   PHYSICAL EXAM:  VITAL SIGNS: ED Triage Vitals  Enc Vitals Group     BP 01/10/16 1210 (!) 156/62     Pulse Rate 01/10/16 1210 76     Resp --      Temp 01/10/16 1210 100 F (37.8 C)     Temp Source 01/10/16 1210 Oral     SpO2 01/10/16 1210 97 %     Weight --      Height 01/10/16 1211 '5\' 6"'$  (1.676 m)     Head Circumference --      Peak Flow --      Pain Score 01/10/16 1214 10     Pain Loc --      Pain Edu? --      Excl. in [place]? --      Constitutional: Alert and oriented. Well appearing and in no acute distress. Eyes: Conjunctivae are normal. PERRL. EOMI. Head: Atraumatic. ENT:      Ears: Tympanic membranes pearly gray with good landmarks.      Nose: Positive for congestion/rhinnorhea.      Mouth/Throat: Mucous membranes are moist. Oropharynx non-erythematous. Tonsils not enlarged. No exudates. Uvula midline. Neck: No stridor.   Hematological/Lymphatic/Immunilogical: No cervical lymphadenopathy.  Cardiovascular: Normal rate, regular rhythm. Normal S1 and S2.  Good peripheral circulation. Respiratory: Normal respiratory effort without tachypnea or retractions. Lungs CTAB. Good air entry to the bases with no decreased or absent breath sounds. Gastrointestinal: Bowel sounds 4 quadrants. Soft and nontender to palpation. No guarding or rigidity. No palpable masses. No distention. No CVA tenderness. Musculoskeletal: Full range of motion to all extremities. No gross deformities appreciated. Neurologic:  Normal speech and language. No gross focal neurologic deficits are appreciated.  Skin:  Skin is warm, dry and intact. No rash noted. Psychiatric: Mood and affect are normal. Speech and behavior are normal. Patient exhibits appropriate insight and judgement.   ____________________________________________   LABS (all labs ordered are listed, but only abnormal results are displayed)  Labs  Reviewed - No data to display ____________________________________________  EKG   ____________________________________________  RADIOLOGY   No results found.  ____________________________________________    PROCEDURES  Procedure(s) performed:    Procedures    Medications - No data to display   ____________________________________________   INITIAL IMPRESSION / ASSESSMENT AND PLAN / ED COURSE  Pertinent labs & imaging results that were available during my care of the patient were reviewed by me and considered in my medical decision making (see chart for details).  Review of the [place] CSRS was performed in accordance of the [place] prior to dispensing any controlled drugs.  Clinical Course     Patient's diagnosis is consistent with upper respiratory infection. Exam and vital signs reassuring. Patient will be discharged home with prescriptions for tessalon pearles. Patient can take Tylenol and is going to start taking Flonase again. Patient has had symptoms for 4 days and is to follow up with PCP if symptoms persist longer than 7-10 days. Patient is given  ED precautions to return to the ED for any worsening or new symptoms. Assessment and plan discussed with Dr. Corky Downs.      ____________________________________________  FINAL CLINICAL IMPRESSION(S) / ED DIAGNOSES  Final diagnoses:  Viral upper respiratory tract infection      NEW MEDICATIONS STARTED DURING THIS VISIT:  Discharge Medication List as of 01/10/2016 12:43 PM    START taking these medications   Details  benzonatate (TESSALON PERLES) 100 MG capsule Take 1 capsule (100 mg total) by mouth 3 (three) times daily as needed for cough., Starting Sun 01/10/2016, Until Mon 01/09/2017, Print            This chart was dictated using voice recognition software/Dragon. Despite best efforts to proofread, errors can occur which can change the meaning. Any change was purely unintentional.    Laban Emperor,  PA-C 01/10/16 [place] Qu[place], MD 01/15/16 [phone]

## 2016-08-10 ENCOUNTER — Emergency Department
Admission: EM | Admit: 2016-08-10 | Discharge: 2016-08-10 | Disposition: A | Discharge status: Home / Self Care | Payer: Medicare Other | Attending: Emergency Medicine | Admitting: Emergency Medicine

## 2016-08-10 ENCOUNTER — Encounter: Payer: Self-pay | Admitting: Emergency Medicine

## 2016-08-10 DIAGNOSIS — I10 Essential (primary) hypertension: Secondary | ICD-10-CM | POA: Diagnosis not present

## 2016-08-10 DIAGNOSIS — J449 Chronic obstructive pulmonary disease, unspecified: Secondary | ICD-10-CM | POA: Insufficient documentation

## 2016-08-10 DIAGNOSIS — F17210 Nicotine dependence, cigarettes, uncomplicated: Secondary | ICD-10-CM | POA: Diagnosis not present

## 2016-08-10 LAB — BASIC METABOLIC PANEL
Anion gap: 7 (ref 5–15)
BUN: 7 mg/dL (ref 6–20)
CO2: 26 mmol/L (ref 22–32)
Calcium: 7.9 mg/dL — ABNORMAL LOW (ref 8.9–10.3)
Chloride: 108 mmol/L (ref 101–111)
Creatinine, Ser: 0.37 mg/dL — ABNORMAL LOW (ref 0.44–1.00)
GFR calc Af Amer: >60 mL/min (ref >60)
GFR calc non Af Amer: >60 mL/min (ref >60)
Glucose, Bld: 85 mg/dL (ref 65–99)
Potassium: 3.5 mmol/L (ref 3.5–5.1)
Sodium: 141 mmol/L (ref 135–145)

## 2016-08-10 MED ORDER — HYDRALAZINE HCL 20 MG/ML IJ SOLN
10.0000 mg | Freq: Once | INTRAMUSCULAR | Status: AC
  Administered 2016-08-10: 10 mg via INTRAVENOUS
  Filled 2016-08-10: qty 1

## 2016-08-10 NOTE — ED Triage Notes (Signed)
Patient presents to ED via [facility] from home for hypertension. At home patient reports BP 215/100. Here BP is 173/72. Patient is asymptomatic at this time. A&O x4. Patient does take amlodipine and lisinopril. Last dose was at 1000.

## 2016-08-10 NOTE — ED Provider Notes (Signed)
[facility] Emergency Department Provider Note  ____________________________________________  Time seen: Approximately 10:53 AM  I have reviewed the triage vital signs and the nursing notes.   HISTORY  Chief Complaint Hypertension    HPI [name] [name] is a 61 y.o. female with a history of difficult to control hypertension, COPD, presenting for hypertension. The patient is part of the pace program and had physician who came to her home today and found her to be hypertensive. Initially, her systolic was 892, but the more they checked at the higher it went. Eventually she had a reading of 215/100. She states that she was resting comfortably when they took her blood pressure, and had taken her morning medications. She states that her blood pressures have been running high over the last couple of weeks.The patient denies any symptoms today or over the past several weeks including headache, blurred vision, numbness tingling or weakness, chest pain, palpitations, shortness of breath, lightheadedness or fainting.   Past Medical History:  Diagnosis Date  . Bowel obstruction (HCC)    x1 month ago  . COPD (chronic obstructive pulmonary disease) [facility])     Patient Active Problem List   Diagnosis Date Noted  . Ischemic bowel disease ([place]) 06/14/2014  . Intestinal angina ([place]) 06/14/2014  . COPD (chronic obstructive pulmonary disease) ([place]) 06/14/2014  . Protein-calorie malnutrition, severe ([place]) 05/27/2014  . Abdominal pain 05/26/2014    Past Surgical History:  Procedure Laterality Date  . COLON SURGERY      Current Outpatient Rx  . Order #: [id number] Class: Historical Med  . Order #: [id number] Class: Historical Med  . Order #: [id number] Class: Print  . Order #: [id number] Class: Historical Med  . Order #: [id number] Class: Historical Med  . Order #: [id number] Class: Historical Med  . Order #: [id number] Class: Historical Med  . Order #: [id number] Class: Historical Med   . Order #: [id number] Class: Normal  . Order #: [id number] Class: Historical Med  . Order #: [id number] Class: Historical Med  . Order #: [id number] Class: Historical Med  . Order #: [id number] Class: No Print    Allergies Aspirin  Family History  Problem Relation Age of Onset  . Heart failure Neg Hx   . Diabetes Neg Hx     Social History Social History  Substance Use Topics  . Smoking status: Current Every Day Smoker    Packs/day: 0.50    Types: Cigarettes  . Smokeless tobacco: Never Used  . Alcohol use No    Review of Systems Constitutional: No fever/chills.No lightheadedness or syncope. Eyes: No visual changes. ENT: No sore throat. No congestion or rhinorrhea. Cardiovascular: Denies chest pain. Denies palpitations. Positive hypertension. Respiratory: Denies shortness of breath.  No cough. Gastrointestinal: No abdominal pain.  No nausea, no vomiting.  No diarrhea.  No constipation. Genitourinary: Negative for dysuria. Musculoskeletal: Negative for back pain. Skin: Negative for rash. Neurological: Negative for headaches. No focal numbness, tingling or weakness.     ____________________________________________   PHYSICAL EXAM:  VITAL SIGNS: ED Triage Vitals  Enc Vitals Group     BP 08/10/16 1044 (!) 173/72     Pulse Rate 08/10/16 1044 63     Resp 08/10/16 1044 17     Temp 08/10/16 1044 98.3 F (36.8 C)     Temp Source 08/10/16 1044 Oral     SpO2 08/10/16 1044 93 %     Weight 08/10/16 1045 91 lb (41.3 kg)     Height 08/10/16 1045 5\' 5"  (1.651 m)     Head Circumference --  Peak Flow --      Pain Score --      Pain Loc --      Pain Edu? --      Excl. in [place]? --     Constitutional: Alert and oriented. Chronically ill appearing but in no acute distress. Answers questions appropriately. Eyes: Conjunctivae are normal.  EOMI. No scleral icterus. Head: Atraumatic. Nose: No congestion/rhinnorhea. Mouth/Throat: Mucous membranes are moist.  Neck: No stridor.   Supple.  Mild JVD.  No meningismus. Cardiovascular: Normal rate, regular rhythm. No murmurs, rubs or gallops.  Respiratory: Normal respiratory effort.  No accessory muscle use or retractions. Lungs CTAB.  No wheezes, rales or ronchi. Gastrointestinal: Soft, nontender and nondistended.  No guarding or rebound.  No peritoneal signs. Musculoskeletal: No LE edema. No ttp in the calves or palpable cords.  Negative Homan's sign. Neurologic:  A&Ox3.  Speech is clear.  Face and smile are symmetric.  EOMI.  Moves all extremities well. Skin:  Skin is warm, dry and intact. No rash noted. Psychiatric: Mood and affect are normal. Speech and behavior are normal.  Normal judgement.  ____________________________________________   LABS (all labs ordered are listed, but only abnormal results are displayed)  Labs Reviewed  BASIC METABOLIC PANEL - Abnormal; Notable for the following:       Result Value   Creatinine, Ser 0.37 (*)    Calcium 7.9 (*)    All other components within normal limits   ____________________________________________  EKG  ED ECG REPORT I, Eula Listen, the attending physician, personally viewed and interpreted this ECG.   Date: 08/10/2016  EKG Time: 1053  Rate: 59  Rhythm: sinus bradycardia  Axis: normal  Intervals:none  ST&T Change: No STEMI  ____________________________________________  RADIOLOGY  No results found.  ____________________________________________   PROCEDURES  Procedure(s) performed: None  Procedures  Critical Care performed: No ____________________________________________   INITIAL IMPRESSION / ASSESSMENT AND PLAN / ED COURSE  Pertinent labs & imaging results that were available during my care of the patient were reviewed by me and considered in my medical decision making (see chart for details).  61 y.o. female with a history of difficult to control hypertension presenting with a symptomatic hypertension. Here, the patient's  repeat blood pressures 173/72 without any intervention. We'll check her creatinine to make sure she has no acute cause for her elevated blood pressure and I will give her dose of hydralazine. Otherwise, the patient is a symptomatic and no further evaluation is indicated at this time. The patient will follow up with her primary care physician for medication management decisions.  ----------------------------------------- 12:18 PM on 08/10/2016 -----------------------------------------  The patient's creatinine is 0.37. Her repeat blood pressures 165/62 and she continues to be symptomatic. The patient has safe for discharge at this time. Return precautions as well as follow-up instructions were discussed.   ____________________________________________  FINAL CLINICAL IMPRESSION(S) / ED DIAGNOSES  Final diagnoses:  Hypertension, unspecified type         NEW MEDICATIONS STARTED DURING THIS VISIT:  New Prescriptions   No medications on file      Eula Listen, MD 08/10/16 1218

## 2016-08-10 NOTE — Discharge Instructions (Signed)
Please make an appointment with your primary care physician to review the medications that are used to treat your hypertension.  Return to the emergency department if you develop severe headache, shortness of breath, numbness weakness or tingling, chest pain, or any other symptoms concerning to you.

## 2016-09-07 ENCOUNTER — Emergency Department: Payer: PRIVATE HEALTH INSURANCE

## 2016-09-07 ENCOUNTER — Inpatient Hospital Stay
Admission: EM | Admit: 2016-09-07 | Discharge: 2016-09-09 | DRG: 372 | Disposition: A | Discharge status: Home / Self Care | Payer: PRIVATE HEALTH INSURANCE | Attending: Internal Medicine | Admitting: Internal Medicine

## 2016-09-07 DIAGNOSIS — R531 Weakness: Secondary | ICD-10-CM

## 2016-09-07 DIAGNOSIS — E876 Hypokalemia: Secondary | ICD-10-CM | POA: Diagnosis present

## 2016-09-07 DIAGNOSIS — J439 Emphysema, unspecified: Secondary | ICD-10-CM | POA: Diagnosis not present

## 2016-09-07 DIAGNOSIS — A044 Other intestinal Escherichia coli infections: Secondary | ICD-10-CM | POA: Diagnosis not present

## 2016-09-07 DIAGNOSIS — E46 Unspecified protein-calorie malnutrition: Secondary | ICD-10-CM | POA: Diagnosis not present

## 2016-09-07 DIAGNOSIS — F17210 Nicotine dependence, cigarettes, uncomplicated: Secondary | ICD-10-CM | POA: Diagnosis not present

## 2016-09-07 DIAGNOSIS — A040 Enteropathogenic Escherichia coli infection: Secondary | ICD-10-CM | POA: Diagnosis present

## 2016-09-07 DIAGNOSIS — Z7902 Long term (current) use of antithrombotics/antiplatelets: Secondary | ICD-10-CM

## 2016-09-07 DIAGNOSIS — R1084 Generalized abdominal pain: Secondary | ICD-10-CM

## 2016-09-07 DIAGNOSIS — Z681 Body mass index (BMI) 19 or less, adult: Secondary | ICD-10-CM

## 2016-09-07 DIAGNOSIS — Z888 Allergy status to other drugs, medicaments and biological substances status: Secondary | ICD-10-CM

## 2016-09-07 DIAGNOSIS — I10 Essential (primary) hypertension: Secondary | ICD-10-CM | POA: Diagnosis present

## 2016-09-07 DIAGNOSIS — Z7951 Long term (current) use of inhaled steroids: Secondary | ICD-10-CM

## 2016-09-07 DIAGNOSIS — Z832 Family history of diseases of the blood and blood-forming organs and certain disorders involving the immune mechanism: Secondary | ICD-10-CM

## 2016-09-07 DIAGNOSIS — R197 Diarrhea, unspecified: Secondary | ICD-10-CM

## 2016-09-07 DIAGNOSIS — R200 Anesthesia of skin: Secondary | ICD-10-CM

## 2016-09-07 DIAGNOSIS — Z79899 Other long term (current) drug therapy: Secondary | ICD-10-CM | POA: Diagnosis not present

## 2016-09-07 DIAGNOSIS — Z716 Tobacco abuse counseling: Secondary | ICD-10-CM

## 2016-09-07 LAB — CBC WITH DIFFERENTIAL/PLATELET
Basophils Absolute: 0.1 10*3/uL (ref 0–0.1)
Basophils Relative: 1 %
Eosinophils Absolute: 0 10*3/uL (ref 0–0.7)
Eosinophils Relative: 0 %
HCT: 38.2 % (ref 35.0–47.0)
Hemoglobin: 13 g/dL (ref 12.0–16.0)
Lymphocytes Relative: 35 %
Lymphs Abs: 3.4 10*3/uL (ref 1.0–3.6)
MCH: 33.3 pg (ref 26.0–34.0)
MCHC: 33.9 g/dL (ref 32.0–36.0)
MCV: 98.1 fL (ref 80.0–100.0)
Monocytes Absolute: 0.6 10*3/uL (ref 0.2–0.9)
Monocytes Relative: 6 %
Neutro Abs: 5.5 10*3/uL (ref 1.4–6.5)
Neutrophils Relative %: 58 %
Platelets: 246 10*3/uL (ref 150–440)
RBC: 3.9 MIL/uL (ref 3.80–5.20)
RDW: 13.2 % (ref 11.5–14.5)
WBC: 9.5 10*3/uL (ref 3.6–11.0)

## 2016-09-07 LAB — GASTROINTESTINAL PANEL BY PCR, STOOL (REPLACES STOOL CULTURE)

## 2016-09-07 LAB — URINALYSIS, COMPLETE (UACMP) WITH MICROSCOPIC
Bacteria, UA: NONE SEEN
Bilirubin Urine: NEGATIVE
Glucose, UA: NEGATIVE mg/dL
Hgb urine dipstick: NEGATIVE
Ketones, ur: NEGATIVE mg/dL
Nitrite: NEGATIVE
Protein, ur: NEGATIVE mg/dL
Specific Gravity, Urine: 1.013 (ref 1.005–1.030)
pH: 6 (ref 5.0–8.0)

## 2016-09-07 LAB — COMPREHENSIVE METABOLIC PANEL
ALT: 27 U/L (ref 14–54)
AST: 30 U/L (ref 15–41)
Albumin: 3.3 g/dL — ABNORMAL LOW (ref 3.5–5.0)
Alkaline Phosphatase: 75 U/L (ref 38–126)
Anion gap: 5 (ref 5–15)
BUN: 10 mg/dL (ref 6–20)
CO2: 31 mmol/L (ref 22–32)
Calcium: 8.1 mg/dL — ABNORMAL LOW (ref 8.9–10.3)
Chloride: 108 mmol/L (ref 101–111)
Creatinine, Ser: 0.42 mg/dL — ABNORMAL LOW (ref 0.44–1.00)
GFR calc Af Amer: >60 mL/min (ref >60)
GFR calc non Af Amer: >60 mL/min (ref >60)
Glucose, Bld: 94 mg/dL (ref 65–99)
Potassium: 2.3 mmol/L — CL (ref 3.5–5.1)
Sodium: 144 mmol/L (ref 135–145)
Total Bilirubin: 0.5 mg/dL (ref 0.3–1.2)
Total Protein: 5.8 g/dL — ABNORMAL LOW (ref 6.5–8.1)

## 2016-09-07 LAB — C DIFFICILE QUICK SCREEN W PCR REFLEX
C Diff antigen: NEGATIVE
C Diff interpretation: NOT DETECTED
C Diff toxin: NEGATIVE

## 2016-09-07 LAB — LACTIC ACID, PLASMA: Lactic Acid, Venous: 1 mmol/L (ref 0.5–1.9)

## 2016-09-07 LAB — TROPONIN I: Troponin I: 0.03 ng/mL (ref <0.03)

## 2016-09-07 LAB — LIPASE, BLOOD: Lipase: 27 U/L (ref 11–51)

## 2016-09-07 MED ORDER — IOPAMIDOL (ISOVUE-300) INJECTION 61%
30.0000 mL | Freq: Once | INTRAVENOUS | Status: AC
  Administered 2016-09-07: 30 mL via ORAL

## 2016-09-07 MED ORDER — POTASSIUM CHLORIDE 10 MEQ/100ML IV SOLN
10.0000 meq | INTRAVENOUS | Status: AC
  Administered 2016-09-08 (×4): 10 meq via INTRAVENOUS
  Filled 2016-09-07 (×7): qty 100

## 2016-09-07 NOTE — ED Notes (Signed)
Yellow isolation door caddy put in place at this time.

## 2016-09-07 NOTE — ED Notes (Signed)
Date and time results received: 09/07/16 2251 (use smartphrase ".now" to insert current time)  Test: potassium Critical Value: 2.3  Name of Provider Notified: Dr. Clearnce Hasten  Orders Received? Or Actions Taken?: Orders Received - See Orders for details

## 2016-09-07 NOTE — ED Notes (Signed)
Date and time results received: 09/07/16 2251 (use smartphrase ".now" to insert current time)  Test: Troponin Critical Value: 0.03  Name of Provider Notified: Dr. Clearnce Hasten  Orders Received? Or Actions Taken?: Orders Received - See Orders for details

## 2016-09-07 NOTE — ED Triage Notes (Signed)
Pt comes via [facility] from home with c/o generalized abdominal pain that has been on going for over a week. Per EMS VS stable, BS 119. PT is A&OX4. Pt states she has had some nausea, but denies vomiting. Respirations even and unlabored.

## 2016-09-07 NOTE — ED Provider Notes (Signed)
[facility] Emergency Department Provider Note   ____________________________________________   First MD Initiated Contact with Patient 09/07/16 2121     (approximate)  I have reviewed the triage vital signs and the nursing notes.   HISTORY  Chief Complaint Abdominal Pain   HPI [name] [name] is a 61 y.o. female with a history of bowel obstruction, COPD as well as ischemic bowel disease was presenting to the emergency department with 1 week of nonbloody diarrhea that she has about 2-3 times per day. Also with 10 out of 10 diffuse abdominal pain which is worse the lower abdomen. Denies any vomiting. Says that she came to the emergency department today at the urging of family because she was having difficult in walking secondary to pain. She says that whenever she moves her left oropharynx she has pain to the abdomen. However, she says that she has also had weakness and numbness to the left side of her body over the past month. She says that she is walking with a cane.   Past Medical History:  Diagnosis Date  . Bowel obstruction (HCC)    x1 month ago  . COPD (chronic obstructive pulmonary disease) [facility])     Patient Active Problem List   Diagnosis Date Noted  . Ischemic bowel disease ([place]) 06/14/2014  . Intestinal angina ([place]) 06/14/2014  . COPD (chronic obstructive pulmonary disease) ([place]) 06/14/2014  . Protein-calorie malnutrition, severe ([place]) 05/27/2014  . Abdominal pain 05/26/2014    Past Surgical History:  Procedure Laterality Date  . COLON SURGERY      Prior to Admission medications   Medication Sig Start Date End Date Taking? Authorizing Provider  acetaminophen (TYLENOL) 160 MG/5ML solution Take 160 mg by mouth every 6 (six) hours as needed for mild pain.     [provider]  albuterol (ACCUNEB) 1.25 MG/3ML nebulizer solution Take 1 ampule by nebulization 3 (three) times daily as needed for wheezing.    [provider]    benzonatate (TESSALON PERLES) 100 MG capsule Take 1 capsule (100 mg total) by mouth 3 (three) times daily as needed for cough. 01/10/16 01/09/17  Laban Emperor, PA-C  budesonide-formoterol (SYMBICORT) 160-4.5 MCG/ACT inhaler Inhale 2 puffs into the lungs 2 (two) times daily.    [provider]  clopidogrel (PLAVIX) 75 MG tablet Take 75 mg by mouth daily.    [provider]  cycloSPORINE (RESTASIS) 0.05 % ophthalmic emulsion Place 1 drop into both eyes every 12 (twelve) hours.    [provider]  fluticasone (FLONASE) 50 MCG/ACT nasal spray Place 2 sprays into both nostrils daily as needed for rhinitis.    [provider]  HEPARIN LOCK FLUSH IV Inject 5 mLs into the vein daily.    [provider]  ondansetron (ZOFRAN) 4 MG tablet Take 1 tablet (4 mg total) by mouth every 6 (six) hours as needed for nausea. 05/29/14   Fritzi Mandes, MD  pregabalin (LYRICA) 75 MG capsule Take 75 mg by mouth 2 (two) times daily.    [provider]  Sodium Chloride Flush (NORMAL SALINE FLUSH IV) Inject 10 mLs into the vein every 12 (twelve) hours.    [provider]  vitamin B-12 (CYANOCOBALAMIN) 1000 MCG tablet Take 1,000 mcg by mouth 2 (two) times daily.    [provider]  Water For Irrigation, Sterile (FREE WATER) SOLN Place 60 mLs into feeding tube every 8 (eight) hours. 05/29/14   Fritzi Mandes, MD    Allergies Aspirin  Family History  Problem Relation Age of Onset  . Heart failure Neg Hx   . Diabetes Neg Hx     Social History Social History  Substance Use Topics  . Smoking status: Current Every Day Smoker    Packs/day: 0.50    Types: Cigarettes  . Smokeless tobacco: Never Used  . Alcohol use No    Review of Systems  Constitutional: No fever/chills Eyes: No visual changes. ENT: No sore throat. Cardiovascular: Denies chest pain. Respiratory: Denies shortness of breath. Gastrointestinal:No nausea, no vomiting. No  constipation. Genitourinary: Negative for dysuria. Musculoskeletal: Negative for back pain. Skin: Negative for rash. Neurological: Negative for headaches   ____________________________________________   PHYSICAL EXAM:  VITAL SIGNS: ED Triage Vitals  Enc Vitals Group     BP 09/07/16 2128 (!) 159/70     Pulse Rate 09/07/16 2128 65     Resp 09/07/16 2128 19     Temp 09/07/16 2128 98.4 F (36.9 C)     Temp Source 09/07/16 2128 Oral     SpO2 09/07/16 2128 97 %     Weight 09/07/16 2135 95 lb (43.1 kg)     Height 09/07/16 2135 5\' 5"  (1.651 m)     Head Circumference --      Peak Flow --      Pain Score 09/07/16 2128 10     Pain Loc --      Pain Edu? --      Excl. in [place]? --     Constitutional: Alert and oriented. Well appearing and in no acute distress. Eyes: Conjunctivae are normal.  Head: Atraumatic. Nose: No congestion/rhinnorhea. Mouth/Throat: Mucous membranes are moist.  Neck: No stridor.   Cardiovascular: Normal rate, regular rhythm. Grossly normal heart sounds.  Good peripheral circulation With equal and bilateral dorsalis pedis pulses. Respiratory: Normal respiratory effort.  No retractions. Lungs CTAB. Gastrointestinal: Soft and mildly distended with diffuse, mild tenderness to palpation which is worse to the lower abdomen. No distention.  Musculoskeletal: No lower extremity tenderness nor edema.  No joint effusions. Neurologic:  Normal speech and language.  Mild sensation deficit to light touch to the upper left as well as lower left extremity. 5 out of 5 strength in left upper extremity but with only 3 out of 5 strength to the left lower extremity.  Skin:  Skin is warm, dry and intact. No rash noted. Psychiatric: Mood and affect are normal. Speech and behavior are normal.  ____________________________________________   LABS (all labs ordered are listed, but only abnormal results are displayed)  Labs Reviewed  COMPREHENSIVE METABOLIC PANEL - Abnormal; Notable  for the following:       Result Value   Potassium 2.3 (*)    Creatinine, Ser 0.42 (*)    Calcium 8.1 (*)    Total Protein 5.8 (*)    Albumin 3.3 (*)    All other components within normal limits  TROPONIN I - Abnormal; Notable for the following:    Troponin I 0.03 (*)    All other components within normal limits  URINALYSIS, COMPLETE (UACMP) WITH MICROSCOPIC - Abnormal; Notable for the following:    Color, Urine YELLOW (*)    APPearance CLEAR (*)    Leukocytes, UA MODERATE (*)    Squamous Epithelial / LPF 0-5 (*)    All other components within normal limits  C DIFFICILE QUICK SCREEN W PCR REFLEX  GASTROINTESTINAL PANEL BY PCR, STOOL (REPLACES STOOL CULTURE)  CBC WITH DIFFERENTIAL/PLATELET  LIPASE, BLOOD   ____________________________________________  EKG  ED ECG REPORT I, Doran Stabler, the attending physician, personally viewed and interpreted this ECG.   Date: 09/07/2016  EKG Time: 2221  Rate: 55  Rhythm: normal sinus rhythm  Axis: Normal  Intervals:Probable LVH.  ST&T Change: No ST segment elevation or depression. No abnormal T-wave inversion.  ____________________________________________  RADIOLOGY  CT head without any acute finding. CT abdomen is pending. ____________________________________________   PROCEDURES  Procedure(s) performed:   Procedures  Critical Care performed:  ____________________________________________   INITIAL IMPRESSION / ASSESSMENT AND PLAN / ED COURSE  Pertinent labs & imaging results that were available during my care of the patient were reviewed by me and considered in my medical decision making (see chart for details).  Patient with neurologic symptoms 1 month. Out of the range for TPA.    ----------------------------------------- 11:18 PM on 09/07/2016 -----------------------------------------  Patient with critically low potassium at 2.3 and persistent diarrhea. Patient will be admitted to the hospital this  time. Patient with need for admission. Signed out to Dr. Ara Kussmaul.   ____________________________________________   FINAL CLINICAL IMPRESSION(S) / ED DIAGNOSES  Abdominal pain. Left sided weakness with numbness. Hypokalemia. Diarrhea.    NEW MEDICATIONS STARTED DURING THIS VISIT:  New Prescriptions   No medications on file     Note:  This document was prepared using Dragon voice recognition software and may include unintentional dictation errors.     Orbie Pyo, MD 09/07/16 [id number]

## 2016-09-07 NOTE — ED Notes (Signed)
Pt given incontinent supplies and is able to self care.

## 2016-09-07 NOTE — ED Notes (Signed)
Patient transported to CT 

## 2016-09-07 NOTE — ED Notes (Signed)
MD Owens Shark notified of pt's + result for [facility]

## 2016-09-07 NOTE — ED Notes (Signed)
Pharmacy called to check on medication of potassium for pt. [name] [name] stated they would send up.

## 2016-09-07 NOTE — ED Notes (Signed)
Pt had large amount of light brown colored diarrhea that was thick in consistently. Specimens sent to lab.

## 2016-09-08 ENCOUNTER — Emergency Department: Payer: PRIVATE HEALTH INSURANCE

## 2016-09-08 ENCOUNTER — Encounter: Payer: Self-pay | Admitting: Radiology

## 2016-09-08 DIAGNOSIS — Z7902 Long term (current) use of antithrombotics/antiplatelets: Secondary | ICD-10-CM | POA: Diagnosis not present

## 2016-09-08 DIAGNOSIS — A040 Enteropathogenic Escherichia coli infection: Secondary | ICD-10-CM | POA: Diagnosis present

## 2016-09-08 DIAGNOSIS — I10 Essential (primary) hypertension: Secondary | ICD-10-CM | POA: Diagnosis present

## 2016-09-08 DIAGNOSIS — Z888 Allergy status to other drugs, medicaments and biological substances status: Secondary | ICD-10-CM | POA: Diagnosis not present

## 2016-09-08 DIAGNOSIS — E876 Hypokalemia: Secondary | ICD-10-CM | POA: Diagnosis present

## 2016-09-08 DIAGNOSIS — Z79899 Other long term (current) drug therapy: Secondary | ICD-10-CM | POA: Diagnosis not present

## 2016-09-08 DIAGNOSIS — A044 Other intestinal Escherichia coli infections: Secondary | ICD-10-CM | POA: Diagnosis present

## 2016-09-08 DIAGNOSIS — Z7951 Long term (current) use of inhaled steroids: Secondary | ICD-10-CM | POA: Diagnosis not present

## 2016-09-08 DIAGNOSIS — Z832 Family history of diseases of the blood and blood-forming organs and certain disorders involving the immune mechanism: Secondary | ICD-10-CM | POA: Diagnosis not present

## 2016-09-08 DIAGNOSIS — Z716 Tobacco abuse counseling: Secondary | ICD-10-CM | POA: Diagnosis not present

## 2016-09-08 DIAGNOSIS — E46 Unspecified protein-calorie malnutrition: Secondary | ICD-10-CM | POA: Diagnosis present

## 2016-09-08 DIAGNOSIS — F17210 Nicotine dependence, cigarettes, uncomplicated: Secondary | ICD-10-CM | POA: Diagnosis present

## 2016-09-08 DIAGNOSIS — Z681 Body mass index (BMI) 19 or less, adult: Secondary | ICD-10-CM | POA: Diagnosis not present

## 2016-09-08 DIAGNOSIS — J439 Emphysema, unspecified: Secondary | ICD-10-CM | POA: Diagnosis present

## 2016-09-08 LAB — BASIC METABOLIC PANEL
Anion gap: 4 — ABNORMAL LOW (ref 5–15)
Anion gap: 7 (ref 5–15)
BUN: 6 mg/dL (ref 6–20)
BUN: 7 mg/dL (ref 6–20)
CO2: 30 mmol/L (ref 22–32)
CO2: 26 mmol/L (ref 22–32)
Calcium: 7.8 mg/dL — ABNORMAL LOW (ref 8.9–10.3)
Calcium: 8.1 mg/dL — ABNORMAL LOW (ref 8.9–10.3)
Chloride: 110 mmol/L (ref 101–111)
Chloride: 108 mmol/L (ref 101–111)
Creatinine, Ser: 0.47 mg/dL (ref 0.44–1.00)
Creatinine, Ser: 0.46 mg/dL (ref 0.44–1.00)
GFR calc Af Amer: >60 mL/min (ref >60)
GFR calc Af Amer: >60 mL/min (ref >60)
GFR calc non Af Amer: >60 mL/min (ref >60)
GFR calc non Af Amer: >60 mL/min (ref >60)
Glucose, Bld: 91 mg/dL (ref 65–99)
Glucose, Bld: 118 mg/dL — ABNORMAL HIGH (ref 65–99)
Potassium: 2.6 mmol/L — CL (ref 3.5–5.1)
Potassium: 3 mmol/L — ABNORMAL LOW (ref 3.5–5.1)
Sodium: 144 mmol/L (ref 135–145)
Sodium: 141 mmol/L (ref 135–145)

## 2016-09-08 LAB — CBC
HCT: 35.9 % (ref 35.0–47.0)
Hemoglobin: 12.2 g/dL (ref 12.0–16.0)
MCH: 33.3 pg (ref 26.0–34.0)
MCHC: 34 g/dL (ref 32.0–36.0)
MCV: 97.8 fL (ref 80.0–100.0)
Platelets: 240 10*3/uL (ref 150–440)
RBC: 3.67 MIL/uL — ABNORMAL LOW (ref 3.80–5.20)
RDW: 13 % (ref 11.5–14.5)
WBC: 9.5 10*3/uL (ref 3.6–11.0)

## 2016-09-08 LAB — LACTIC ACID, PLASMA: Lactic Acid, Venous: 1.5 mmol/L (ref 0.5–1.9)

## 2016-09-08 LAB — MAGNESIUM: Magnesium: 1.7 mg/dL (ref 1.7–2.4)

## 2016-09-08 MED ORDER — MORPHINE SULFATE (PF) 2 MG/ML IV SOLN: 2.0000 mg | INTRAVENOUS | Status: DC | PRN

## 2016-09-08 MED ORDER — IOPAMIDOL (ISOVUE-300) INJECTION 61%
75.0000 mL | Freq: Once | INTRAVENOUS | Status: AC | PRN
  Administered 2016-09-08: 75 mL via INTRAVENOUS

## 2016-09-08 MED ORDER — PNEUMOCOCCAL VAC POLYVALENT 25 MCG/0.5ML IJ INJ: 0.5000 mL | INJECTION | INTRAMUSCULAR | Status: DC

## 2016-09-08 MED ORDER — SODIUM CHLORIDE 0.9 % IV SOLN
1.0000 g | Freq: Once | INTRAVENOUS | Status: AC
  Administered 2016-09-08: 1 g via INTRAVENOUS
  Filled 2016-09-08: qty 10

## 2016-09-08 MED ORDER — ALBUTEROL SULFATE (2.5 MG/3ML) 0.083% IN NEBU: 2.5000 mg | INHALATION_SOLUTION | Freq: Three times a day (TID) | RESPIRATORY_TRACT | Status: DC | PRN

## 2016-09-08 MED ORDER — ENSURE ENLIVE PO LIQD
237.0000 mL | Freq: Two times a day (BID) | ORAL | Status: DC
  Administered 2016-09-08 – 2016-09-09 (×3): 237 mL via ORAL

## 2016-09-08 MED ORDER — CIPROFLOXACIN HCL 500 MG PO TABS
500.0000 mg | ORAL_TABLET | Freq: Two times a day (BID) | ORAL | Status: DC
  Administered 2016-09-08 – 2016-09-09 (×2): 500 mg via ORAL
  Filled 2016-09-08 (×3): qty 1

## 2016-09-08 MED ORDER — MOMETASONE FURO-FORMOTEROL FUM 200-5 MCG/ACT IN AERO
2.0000 | INHALATION_SPRAY | Freq: Two times a day (BID) | RESPIRATORY_TRACT | Status: DC
  Administered 2016-09-08 – 2016-09-09 (×2): 2 via RESPIRATORY_TRACT
  Filled 2016-09-08: qty 8.8

## 2016-09-08 MED ORDER — ENOXAPARIN SODIUM 40 MG/0.4ML ~~LOC~~ SOLN: 40.0000 mg | SUBCUTANEOUS | Status: DC

## 2016-09-08 MED ORDER — POTASSIUM CHLORIDE 10 MEQ/100ML IV SOLN
10.0000 meq | INTRAVENOUS | Status: AC
  Administered 2016-09-08 (×2): 10 meq via INTRAVENOUS
  Filled 2016-09-08 (×2): qty 100

## 2016-09-08 MED ORDER — ACETAMINOPHEN 650 MG RE SUPP: 650.0000 mg | Freq: Four times a day (QID) | RECTAL | Status: DC | PRN

## 2016-09-08 MED ORDER — ONDANSETRON HCL 4 MG/2ML IJ SOLN: 4.0000 mg | Freq: Four times a day (QID) | INTRAMUSCULAR | Status: DC | PRN

## 2016-09-08 MED ORDER — ONDANSETRON HCL 4 MG PO TABS: 4.0000 mg | ORAL_TABLET | Freq: Four times a day (QID) | ORAL | Status: DC | PRN

## 2016-09-08 MED ORDER — PREGABALIN 50 MG PO CAPS: 75.0000 mg | ORAL_CAPSULE | Freq: Two times a day (BID) | ORAL | Status: DC

## 2016-09-08 MED ORDER — PREGABALIN 50 MG PO CAPS
100.0000 mg | ORAL_CAPSULE | Freq: Three times a day (TID) | ORAL | Status: DC
  Administered 2016-09-08 – 2016-09-09 (×3): 100 mg via ORAL
  Filled 2016-09-08 (×3): qty 2

## 2016-09-08 MED ORDER — NICOTINE 14 MG/24HR TD PT24
14.0000 mg | MEDICATED_PATCH | Freq: Every day | TRANSDERMAL | Status: DC
  Filled 2016-09-08: qty 1

## 2016-09-08 MED ORDER — MAGNESIUM SULFATE 2 GM/50ML IV SOLN
2.0000 g | Freq: Once | INTRAVENOUS | Status: AC
  Administered 2016-09-08: 2 g via INTRAVENOUS
  Filled 2016-09-08: qty 50

## 2016-09-08 MED ORDER — ENOXAPARIN SODIUM 30 MG/0.3ML ~~LOC~~ SOLN: 30.0000 mg | SUBCUTANEOUS | Status: DC

## 2016-09-08 MED ORDER — POTASSIUM CHLORIDE IN NACL 40-0.9 MEQ/L-% IV SOLN
INTRAVENOUS | Status: DC
  Administered 2016-09-08 – 2016-09-09 (×2): 75 mL/h via INTRAVENOUS
  Filled 2016-09-08 (×4): qty 1000

## 2016-09-08 MED ORDER — CLOPIDOGREL BISULFATE 75 MG PO TABS
75.0000 mg | ORAL_TABLET | Freq: Every day | ORAL | Status: DC
  Administered 2016-09-08 – 2016-09-09 (×2): 75 mg via ORAL
  Filled 2016-09-08 (×2): qty 1

## 2016-09-08 MED ORDER — POTASSIUM CHLORIDE CRYS ER 20 MEQ PO TBCR
60.0000 meq | EXTENDED_RELEASE_TABLET | Freq: Once | ORAL | Status: AC
  Administered 2016-09-08: 60 meq via ORAL
  Filled 2016-09-08: qty 3

## 2016-09-08 MED ORDER — CYCLOSPORINE 0.05 % OP EMUL
1.0000 [drp] | Freq: Two times a day (BID) | OPHTHALMIC | Status: DC
  Administered 2016-09-08 – 2016-09-09 (×3): 1 [drp] via OPHTHALMIC
  Filled 2016-09-08 (×5): qty 1

## 2016-09-08 MED ORDER — FLUTICASONE PROPIONATE 50 MCG/ACT NA SUSP
2.0000 | Freq: Every day | NASAL | Status: DC | PRN
  Administered 2016-09-08: 2 via NASAL
  Filled 2016-09-08: qty 16

## 2016-09-08 MED ORDER — ACETAMINOPHEN 325 MG PO TABS: 650.0000 mg | ORAL_TABLET | Freq: Four times a day (QID) | ORAL | Status: DC | PRN

## 2016-09-08 NOTE — Progress Notes (Signed)
Patient has been annoyed that she is NPO.  Have explained to her that it's due to her abdominal pain.  Diet is being advanced to clear liquid.  She is very angry.  Has been swearing about everything.  She threw her boyfriend out of the room.  Says she should go home where she can ear.  Says she shouldn't have come in in the first place.  Told her I would contact the physician regarding her diet.

## 2016-09-08 NOTE — Progress Notes (Signed)
Initial Nutrition Assessment  DOCUMENTATION CODES:   Underweight  INTERVENTION:  1. Ensure Enlive po BID, each supplement provides 350 kcal and 20 grams of protein  NUTRITION DIAGNOSIS:   Altered GI function related to chronic illness (60cm Ileum s/p sbr 3 years ago) as evidenced by per patient/family report.  GOAL:   Patient will meet greater than or equal to 90% of their needs  MONITOR:   PO intake, I & O's, Labs, Supplement acceptance, Weight trends  REASON FOR ASSESSMENT:   Other (Comment) (low BMI)    ASSESSMENT:   [name] [name] is a 61 yo female with PMHHx of bowel obstruction 1 month ago, COPD & emphysema, chronic ischemic bowel dz s/p small bowel resection with ileoduodenostomy, cholecystectomy. Only has 60cm of ileum (normally approximately 3.55M long) SBS, presents with abdominal pain, diarrhea, loose stools 2-3 times per day x1 week, C Diff negative, acute gastroenteritis. Also has had weakness and numbness to left side of her body over the past month.  Spoke with patient at bedside. She reports eating well PTA, eggs, bacon, grits for breakfast, chicken, mac and cheese, cornbread etc for lunch and dinner. Appetite is very good. Patient was complaining about being NPO this morning for abdominal pain. Ordered lunch. Normally drinks protein powder at home. Does not appear malnourished physically. Question ability to absorb PO intake but patient denies weight loss. Will monitor for needs during stay.  Labs reviewed:  K 2.6  Medications reviewed and include:  NS 40K+ at 62m/hr Magnesium 2g IV K+ 125m every 2 hrs x5  Diet Order:  Diet regular Room service appropriate? Yes; Fluid consistency: Thin  Skin:  Reviewed, no issues  Last BM:  09/08/2016  Height:   Ht Readings from Last 1 Encounters:  09/07/16 5' 5"  (1.651 m)    Weight:   Wt Readings from Last 1 Encounters:  09/07/16 95 lb (43.1 kg)    Ideal Body Weight:  56.81 kg  BMI:  Body mass index is 15.81  kg/m.  Estimated Nutritional Needs:   Kcal:  1213-1415 (MSJ x1.2-1.4)  Protein:  54-65 grams (1.25-1.5g/kg)  Fluid:  >1.5L  EDUCATION NEEDS:   No education needs identified at this time  [name] AnisWard, MS, RD LDN Inpatient Clinical Dietitian Pager [phone]

## 2016-09-08 NOTE — ED Notes (Signed)
Patient transported to CT 

## 2016-09-08 NOTE — Progress Notes (Signed)
Pharmacist - Prescriber Communication  Enoxaparin dose modified to 30 mg subcutaneously once daily due to actual body weight less than 45 kg.   Draven Natter A. [place], [place].D., BCPS Clinical Pharmacist 09/08/2016 10:36

## 2016-09-08 NOTE — H&P (Signed)
[place] [facility] [place] NAME: [name]    MR#:  [id number]  DATE OF BIRTH:  1955-06-01  DATE OF ADMISSION:  09/07/2016  PRIMARY CARE PHYSICIAN: Cletis Athens, MD   REQUESTING/REFERRING PHYSICIAN:   CHIEF COMPLAINT:   Chief Complaint  Patient presents with  . Abdominal Pain    HISTORY OF PRESENT ILLNESS: [name] [name]  is a 61 y.o. female with a known history of bowel obstruction, emphysema presented to the emergency room with abdominal pain and diarrhea. Pain going on for the last few days. Abdominal pain is aching in nature 5 out of 10 on a scale of 1-10 located around umbilicus.The diarrhea is loose watery stool with no blood. Patient has some nausea. Patient was tested for C. Difficile toxin which was negative.She was worked up with CT abdomen which showed no obstruction but mild enteritis. No recent travel. No complaints of any chest pain, shortness of breath. Hospitalist service was consulted for further care of the patient. Workup in the emergency room showed low potassium level of 2.3.  PAST MEDICAL HISTORY:   Past Medical History:  Diagnosis Date  . Bowel obstruction (HCC)    x1 month ago  . COPD (chronic obstructive pulmonary disease) ([place])     PAST SURGICAL HISTORY: Past Surgical History:  Procedure Laterality Date  . COLON SURGERY      SOCIAL HISTORY:  Social History  Substance Use Topics  . Smoking status: Current Every Day Smoker    Packs/day: 0.50    Types: Cigarettes  . Smokeless tobacco: Never Used  . Alcohol use No    FAMILY HISTORY:  Family History  Problem Relation Age of Onset  . Lupus Sister   . Heart failure Neg Hx   . Diabetes Neg Hx     DRUG ALLERGIES:  Allergies  Allergen Reactions  . Aspirin Hives and Itching    REVIEW OF SYSTEMS:   CONSTITUTIONAL: No fever, has weakness.  EYES: No blurred or double vision.  EARS, NOSE, AND THROAT: No tinnitus or ear pain.  RESPIRATORY: No cough,  shortness of breath, wheezing or hemoptysis.  CARDIOVASCULAR: No chest pain, orthopnea, edema.  GASTROINTESTINAL: Has nausea, vomiting, diarrhea and abdominal pain.  GENITOURINARY: No dysuria, hematuria.  ENDOCRINE: No polyuria, nocturia,  HEMATOLOGY: No anemia, easy bruising or bleeding SKIN: No rash or lesion. MUSCULOSKELETAL: No joint pain or arthritis.   NEUROLOGIC: No tingling, numbness, weakness.  PSYCHIATRY: No anxiety or depression.   MEDICATIONS AT HOME:  Prior to Admission medications   Medication Sig Start Date End Date Taking? Authorizing Provider  acetaminophen (TYLENOL) 160 MG/5ML solution Take 160 mg by mouth every 6 (six) hours as needed for mild pain.     [provider]  albuterol (ACCUNEB) 1.25 MG/3ML nebulizer solution Take 1 ampule by nebulization 3 (three) times daily as needed for wheezing.    [provider]  benzonatate (TESSALON PERLES) 100 MG capsule Take 1 capsule (100 mg total) by mouth 3 (three) times daily as needed for cough. 01/10/16 01/09/17  Laban Emperor, PA-C  budesonide-formoterol (SYMBICORT) 160-4.5 MCG/ACT inhaler Inhale 2 puffs into the lungs 2 (two) times daily.    [provider]  clopidogrel (PLAVIX) 75 MG tablet Take 75 mg by mouth daily.    [provider]  cycloSPORINE (RESTASIS) 0.05 % ophthalmic emulsion Place 1 drop into both eyes every 12 (twelve) hours.    [provider]  fluticasone (FLONASE) 50 MCG/ACT nasal spray Place  2 sprays into both nostrils daily as needed for rhinitis.    [provider]  HEPARIN LOCK FLUSH IV Inject 5 mLs into the vein daily.    [provider]  ondansetron (ZOFRAN) 4 MG tablet Take 1 tablet (4 mg total) by mouth every 6 (six) hours as needed for nausea. 05/29/14   Fritzi Mandes, MD  pregabalin (LYRICA) 75 MG capsule Take 75 mg by mouth 2 (two) times daily.    [provider]  Sodium Chloride Flush (NORMAL SALINE FLUSH IV) Inject 10 mLs into the  vein every 12 (twelve) hours.    [provider]  vitamin B-12 (CYANOCOBALAMIN) 1000 MCG tablet Take 1,000 mcg by mouth 2 (two) times daily.    [provider]  Water For Irrigation, Sterile (FREE WATER) SOLN Place 60 mLs into feeding tube every 8 (eight) hours. 05/29/14   Fritzi Mandes, MD      PHYSICAL EXAMINATION:   VITAL SIGNS: Blood pressure (!) 144/70, pulse 60, temperature 98.4 F (36.9 C), temperature source Oral, resp. rate 11, height 5\' 5"  (1.651 m), weight 43.1 kg (95 lb), SpO2 99 %.  GENERAL:  61 y.o.-year-old patient lying in the bed with no acute distress.  EYES: Pupils equal, round, reactive to light and accommodation. No scleral icterus. Extraocular muscles intact.  HEENT: Head atraumatic, normocephalic. Oropharynx dry and nasopharynx clear.  NECK:  Supple, no jugular venous distention. No thyroid enlargement, no tenderness.  LUNGS: Normal breath sounds bilaterally, no wheezing, rales,rhonchi or crepitation. No use of accessory muscles of respiration.  CARDIOVASCULAR: S1, S2 normal. No murmurs, rubs, or gallops.  ABDOMEN: Soft, tenderness around umbilicus noted, nondistended. Bowel sounds present. No organomegaly or mass.  EXTREMITIES: No pedal edema, cyanosis, or clubbing.  NEUROLOGIC: Cranial nerves II through XII are intact. Muscle strength 5/5 in all extremities. Sensation intact. Gait not checked.  PSYCHIATRIC: The patient is alert and oriented x 3.  SKIN: No obvious rash, lesion, or ulcer.   LABORATORY PANEL:   CBC  Recent Labs Lab 09/07/16 2138  WBC 9.5  HGB 13.0  HCT 38.2  PLT 246  MCV 98.1  MCH 33.3  MCHC 33.9  RDW 13.2  LYMPHSABS 3.4  MONOABS 0.6  EOSABS 0.0  BASOSABS 0.1   ------------------------------------------------------------------------------------------------------------------  Chemistries   Recent Labs Lab 09/07/16 2138  NA 144  K 2.3*  CL 108  CO2 31  GLUCOSE 94  BUN 10  CREATININE 0.42*  CALCIUM 8.1*  AST  30  ALT 27  ALKPHOS 75  BILITOT 0.5   ------------------------------------------------------------------------------------------------------------------ estimated creatinine clearance is 51.5 mL/min (A) (by C-G formula based on SCr of 0.42 mg/dL (L)). ------------------------------------------------------------------------------------------------------------------ No results for input(s): TSH, T4TOTAL, T3FREE, THYROIDAB in the last 72 hours.  Invalid input(s): FREET3   Coagulation profile No results for input(s): INR, PROTIME in the last 168 hours. ------------------------------------------------------------------------------------------------------------------- No results for input(s): DDIMER in the last 72 hours. -------------------------------------------------------------------------------------------------------------------  Cardiac Enzymes  Recent Labs Lab 09/07/16 2138  TROPONINI 0.03*   ------------------------------------------------------------------------------------------------------------------ Invalid input(s): POCBNP  ---------------------------------------------------------------------------------------------------------------  Urinalysis    Component Value Date/Time   COLORURINE YELLOW (A) 09/07/2016 2138   APPEARANCEUR CLEAR (A) 09/07/2016 2138   APPEARANCEUR Cloudy 11/04/2013 1236   LABSPEC 1.013 09/07/2016 2138   LABSPEC 1.025 11/04/2013 1236   PHURINE 6.0 09/07/2016 2138   GLUCOSEU NEGATIVE 09/07/2016 2138   GLUCOSEU Negative 11/04/2013 1236   HGBUR NEGATIVE 09/07/2016 2138   BILIRUBINUR NEGATIVE 09/07/2016 2138   BILIRUBINUR Negative 11/04/2013 1236   KETONESUR NEGATIVE  09/07/2016 2138   PROTEINUR NEGATIVE 09/07/2016 2138   NITRITE NEGATIVE 09/07/2016 2138   LEUKOCYTESUR MODERATE (A) 09/07/2016 2138   LEUKOCYTESUR 3+ 11/04/2013 1236     RADIOLOGY: Ct Head Wo Contrast  Result Date: 09/07/2016 CLINICAL DATA:  Generalized abdominal pain,  nausea. LEFT extremity weakness for 1 month. Assess for stroke. EXAM: CT HEAD WITHOUT CONTRAST TECHNIQUE: Contiguous axial images were obtained from the base of the skull through the vertex without intravenous contrast. COMPARISON:  CT HEAD October 28, 2013 FINDINGS: BRAIN: No intraparenchymal hemorrhage, mass effect nor midline shift. The ventricles and sulci are normal for age. No acute large vascular territory infarcts. No abnormal extra-axial fluid collections. Basal cisterns are patent. VASCULAR: Trace calcific atherosclerosis of the carotid siphons. SKULL: No skull fracture. No significant scalp soft tissue swelling. SINUSES/ORBITS: The mastoid air-cells and included paranasal sinuses are well-aerated.The included ocular globes and orbital contents are non-suspicious. OTHER: None. IMPRESSION: Stable negative CT HEAD for age. Electronically Signed   By: Elon Alas M.D.   On: 09/07/2016 22:11   Ct Abdomen Pelvis W Contrast  Result Date: 09/08/2016 CLINICAL DATA:  Nonbloody diarrhea EXAM: CT ABDOMEN AND PELVIS WITH CONTRAST TECHNIQUE: Multidetector CT imaging of the abdomen and pelvis was performed using the standard protocol following bolus administration of intravenous contrast. CONTRAST:  70mL ISOVUE-300 IOPAMIDOL (ISOVUE-300) INJECTION 61% COMPARISON:  07/11/2014 FINDINGS: Lower chest: Patchy dependent atelectasis at the bilateral lung bases. Borderline cardiomegaly. Hepatobiliary: Mild intra and extrahepatic biliary dilatation status post cholecystectomy. No change. Pancreas: Unremarkable. No pancreatic ductal dilatation or surrounding inflammatory changes. Spleen: Normal in size without focal abnormality. Adrenals/Urinary Tract: Adrenal glands are within normal limits. subcentimeter hypodense lesions in the kidneys too small to further characterize. Increased size of upper pole lesion right kidney now measuring 14 mm and contains internal increased density. Bladder unremarkable. Stomach/Bowel:  Stomach nonenlarged. Postsurgical changes in the left upper quadrant and upper pelvis. No convincing evidence for obstruction as there is distal contrast present. Possible mild left lower quadrant small bowel thickening. Negative for pneumatosis. Vascular/Lymphatic: Aortic atherosclerotic calcifications. Chronic occlusion of the celiac and SMA origins. Prominent gonadal veins again demonstrated. No significantly enlarged lymph nodes. Reproductive: No adnexal masses. A small enhancing focus in the central low pelvis may reflect a small enhancing fibroid, present on comparison study. Other: Negative for free air or free fluid. Musculoskeletal: No acute or significant osseous findings. IMPRESSION: 1. Negative for obstruction. There are postsurgical changes of the bowel. Questionable mild wall thickening of left lower quadrant small bowel loops as may be seen with enteritis. 2. 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 3. 14 mm right kidney upper pole lesion, slight increase in size and intermediate in density. Could further evaluate with non emergent MRI as indicated 4. Stable intra and extrahepatic biliary dilatation post cholecystectomy. Electronically Signed   By: Donavan Foil M.D.   On: 09/08/2016 00:59    EKG: Orders placed or performed during the hospital encounter of 09/07/16  . ED EKG  . ED EKG  . ED EKG  . ED EKG  . EKG 12-Lead  . EKG 12-Lead    IMPRESSION AND PLAN: 61 year old female patient with history of COPD and bowel obstruction in the past presented to the emergency room with abdominal pain nausea vomiting and diarrhea. Admitting diagnosis 1. Acute gastroenteritis 2. Severe hypokalemia 3. Emphysema 4. Abdominal pain Treatment plan Admit patient to telemetry IV fluid hydration Potassium supplementation intravenously Management abdominal pain with IV morphine Monitor for  any arrhythmias  All the records are reviewed and case discussed with ED  provider. Management plans discussed with the patient, family and they are in agreement.  CODE STATUS:FULL CODE Code Status History    Date Active Date Inactive Code Status Order ID Comments User Context   06/14/2014  9:55 PM 06/15/2014  5:31 PM Full Code [id number]  Lytle Butte, MD ED   05/26/2014  7:12 PM 06/02/2014  7:41 PM Full Code [id number]  Bettey Costa, MD Inpatient       TOTAL TIME TAKING CARE OF THIS PATIENT: 52 minutes.    Saundra Shelling M.D on 09/08/2016 at 3:08 AM  Between 7am to 6pm - Pager - [phone]  After 6pm go to [email or web address] - password [email or web address] [facility]  Office  [phone]  CC: Primary care physician; Cletis Athens, MD

## 2016-09-08 NOTE — Progress Notes (Signed)
Patient has 2 runs of potassium to be given and IVF fluids with potassium ordered. Patient only has 1 IV and is refusing getting another one. Runs of potassium will be given first, then IVF will be resumed. Will continue to monitor.   [name] [name]

## 2016-09-08 NOTE — Progress Notes (Signed)
Unable to find the patient on 2A or 2C.  Also looked in the corridor to ICU.  Security called,  /suspected that she went out to smoke.  Security located her quickly and returned her to the unit.

## 2016-09-08 NOTE — ED Notes (Signed)
Pt up to toilet. Linens changed and incontinent supplies given to pt. Pt also given meal tray and sprite.

## 2016-09-08 NOTE — Progress Notes (Signed)
MEDICATION RELATED CONSULT NOTE - INITIAL   Pharmacy Consult for electrolyte management Indication: hypokalemia  Allergies  Allergen Reactions  . Aspirin Hives and Itching    Patient Measurements: Height: 5\' 5"  (165.1 cm) Weight: 95 lb (43.1 kg) IBW/kg (Calculated) : 57 Adjusted Body Weight:   Vital Signs: Temp: 98 F (36.7 C) (08/16 1949) Temp Source: Oral (08/16 1949) BP: 116/94 (08/16 1949) Pulse Rate: 70 (08/16 1949) Intake/Output from previous day: No intake/output data recorded. Intake/Output from this shift: No intake/output data recorded.  Labs:  Recent Labs  09/07/16 2138 09/08/16 0411 09/08/16 1811  WBC 9.5 9.5  --   HGB 13.0 12.2  --   HCT 38.2 35.9  --   PLT 246 240  --   CREATININE 0.42* 0.47 0.46  MG  --  1.7  --   ALBUMIN 3.3*  --   --   PROT 5.8*  --   --   AST 30  --   --   ALT 27  --   --   ALKPHOS 75  --   --   BILITOT 0.5  --   --    Estimated Creatinine Clearance: 51.5 mL/min (by C-G formula based on SCr of 0.46 mg/dL).   Microbiology: Recent Results (from the past 720 hour(s))  Gastrointestinal Panel by PCR , Stool     Status: Abnormal   Collection Time: 09/07/16  9:38 PM  Result Value Ref Range Status   Campylobacter species NOT DETECTED NOT DETECTED Final   Plesimonas shigelloides NOT DETECTED NOT DETECTED Final   Salmonella species NOT DETECTED NOT DETECTED Final   Yersinia enterocolitica NOT DETECTED NOT DETECTED Final   Vibrio species NOT DETECTED NOT DETECTED Final   Vibrio cholerae NOT DETECTED NOT DETECTED Final   Enteroaggregative E coli (EAEC) NOT DETECTED NOT DETECTED Final   Enteropathogenic E coli (EPEC) DETECTED (A) NOT DETECTED Final    Comment: CRITICAL RESULT CALLED TO, READ BACK BY AND VERIFIED WITH: SAMANTHA HAMILTON AT 2337 09/07/16 ALV    Enterotoxigenic E coli (ETEC) NOT DETECTED NOT DETECTED Final   Shiga like toxin producing E coli (STEC) NOT DETECTED NOT DETECTED Final   Shigella/Enteroinvasive E coli  (EIEC) NOT DETECTED NOT DETECTED Final   Cryptosporidium NOT DETECTED NOT DETECTED Final   Cyclospora cayetanensis NOT DETECTED NOT DETECTED Final   Entamoeba histolytica NOT DETECTED NOT DETECTED Final   Giardia lamblia NOT DETECTED NOT DETECTED Final   Adenovirus F40/41 NOT DETECTED NOT DETECTED Final   Astrovirus NOT DETECTED NOT DETECTED Final   Norovirus GI/GII NOT DETECTED NOT DETECTED Final   Rotavirus A NOT DETECTED NOT DETECTED Final   Sapovirus (I, II, IV, and V) NOT DETECTED NOT DETECTED Final  C difficile quick scan w PCR reflex     Status: None   Collection Time: 09/07/16  9:38 PM  Result Value Ref Range Status   C Diff antigen NEGATIVE NEGATIVE Final   C Diff toxin NEGATIVE NEGATIVE Final   C Diff interpretation No C. difficile detected.  Final    Medical History: Past Medical History:  Diagnosis Date  . Bowel obstruction (HCC)    x1 month ago  . COPD (chronic obstructive pulmonary disease) (HCC)     Medications:  Infusions:  . 0.9 % NaCl with KCl 40 mEq / L 75 mL/hr (09/08/16 0951)    Assessment: 61 yof cc abdominal pain with low potassium level on admission. PMH bowel obstruction one month ago and COPD. Pharmacy consulted to  manage electrolytes. Patient is NPO at this time, and CrCl approximately 50 to 55 mL/min.   Plan:  Will order KCl 60 meq po once as patient is tolerating diet and medications. Will f/u AM labs.    Ulice Dash D, Pharm.D., BCPS Clinical Pharmacist 09/08/2016,7:50 PM

## 2016-09-08 NOTE — Progress Notes (Signed)
[facility] [facility] [place] NAME: [name] [name]    MR#:  [id number]  DATE OF BIRTH:  1955-11-28  SUBJECTIVE:  CHIEF COMPLAINT:  Nausea and vomiting resolved. Reporting abdominal discomfort and  diarrhea, tolerating clear liquid diet  REVIEW OF SYSTEMS:  CONSTITUTIONAL: No fever, fatigue or weakness.  EYES: No blurred or double vision.  EARS, NOSE, AND THROAT: No tinnitus or ear pain.  RESPIRATORY: No cough, shortness of breath, wheezing or hemoptysis.  CARDIOVASCULAR: No chest pain, orthopnea, edema.  GASTROINTESTINAL: No nausea, vomiting, Reports diarrhea and abdominal discomfort, denies abdominal pain.  GENITOURINARY: No dysuria, hematuria.  ENDOCRINE: No polyuria, nocturia,  HEMATOLOGY: No anemia, easy bruising or bleeding SKIN: No rash or lesion. MUSCULOSKELETAL: No joint pain or arthritis.   NEUROLOGIC: No tingling, numbness, weakness.  PSYCHIATRY: No anxiety or depression.   DRUG ALLERGIES:   Allergies  Allergen Reactions  . Aspirin Hives and Itching    VITALS:  Blood pressure (!) 132/41, pulse (!) 54, temperature 97.9 F (36.6 C), temperature source Oral, resp. rate 16, height 5\' 5"  (1.651 m), weight 43.1 kg (95 lb), SpO2 99 %.  PHYSICAL EXAMINATION:  GENERAL:  61 y.o.-year-old patient lying in the bed with no acute distress. Cachectic EYES: Pupils equal, round, reactive to light and accommodation. No scleral icterus. Extraocular muscles intact.  HEENT: Head atraumatic, normocephalic. Oropharynx and nasopharynx clear.  NECK:  Supple, no jugular venous distention. No thyroid enlargement, no tenderness.  LUNGS: Normal breath sounds bilaterally, no wheezing, rales,rhonchi or crepitation. No use of accessory muscles of respiration.  CARDIOVASCULAR: S1, S2 normal. No murmurs, rubs, or gallops.  ABDOMEN: Soft, nontender, nondistended. Bowel sounds present. EXTREMITIES: No pedal edema, cyanosis, or clubbing.  NEUROLOGIC: Cranial  nerves II through XII are intact. Muscle strength 5/5 in all extremities. Sensation intact. Gait not checked.  PSYCHIATRIC: The patient is alert and oriented x 3.  SKIN: No obvious rash, lesion, or ulcer.    LABORATORY PANEL:   CBC  Recent Labs Lab 09/08/16 0411  WBC 9.5  HGB 12.2  HCT 35.9  PLT 240   ------------------------------------------------------------------------------------------------------------------  Chemistries   Recent Labs Lab 09/07/16 2138 09/08/16 0411  NA 144 144  K 2.3* 2.6*  CL 108 110  CO2 31 30  GLUCOSE 94 91  BUN 10 6  CREATININE 0.42* 0.47  CALCIUM 8.1* 7.8*  MG  --  1.7  AST 30  --   ALT 27  --   ALKPHOS 75  --   BILITOT 0.5  --    ------------------------------------------------------------------------------------------------------------------  Cardiac Enzymes  Recent Labs Lab 09/07/16 2138  TROPONINI 0.03*   ------------------------------------------------------------------------------------------------------------------  RADIOLOGY:  Ct Head Wo Contrast  Result Date: 09/07/2016 CLINICAL DATA:  Generalized abdominal pain, nausea. LEFT extremity weakness for 1 month. Assess for stroke. EXAM: CT HEAD WITHOUT CONTRAST TECHNIQUE: Contiguous axial images were obtained from the base of the skull through the vertex without intravenous contrast. COMPARISON:  CT HEAD October 28, 2013 FINDINGS: BRAIN: No intraparenchymal hemorrhage, mass effect nor midline shift. The ventricles and sulci are normal for age. No acute large vascular territory infarcts. No abnormal extra-axial fluid collections. Basal cisterns are patent. VASCULAR: Trace calcific atherosclerosis of the carotid siphons. SKULL: No skull fracture. No significant scalp soft tissue swelling. SINUSES/ORBITS: The mastoid air-cells and included paranasal sinuses are well-aerated.The included ocular globes and orbital contents are non-suspicious. OTHER: None. IMPRESSION: Stable negative CT  HEAD for age. Electronically Signed   By: Elon Alas  M.D.   On: 09/07/2016 22:11   Ct Abdomen Pelvis W Contrast  Result Date: 09/08/2016 CLINICAL DATA:  Nonbloody diarrhea EXAM: CT ABDOMEN AND PELVIS WITH CONTRAST TECHNIQUE: Multidetector CT imaging of the abdomen and pelvis was performed using the standard protocol following bolus administration of intravenous contrast. CONTRAST:  55mL ISOVUE-300 IOPAMIDOL (ISOVUE-300) INJECTION 61% COMPARISON:  07/11/2014 FINDINGS: Lower chest: Patchy dependent atelectasis at the bilateral lung bases. Borderline cardiomegaly. Hepatobiliary: Mild intra and extrahepatic biliary dilatation status post cholecystectomy. No change. Pancreas: Unremarkable. No pancreatic ductal dilatation or surrounding inflammatory changes. Spleen: Normal in size without focal abnormality. Adrenals/Urinary Tract: Adrenal glands are within normal limits. subcentimeter hypodense lesions in the kidneys too small to further characterize. Increased size of upper pole lesion right kidney now measuring 14 mm and contains internal increased density. Bladder unremarkable. Stomach/Bowel: Stomach nonenlarged. Postsurgical changes in the left upper quadrant and upper pelvis. No convincing evidence for obstruction as there is distal contrast present. Possible mild left lower quadrant small bowel thickening. Negative for pneumatosis. Vascular/Lymphatic: Aortic atherosclerotic calcifications. Chronic occlusion of the celiac and SMA origins. Prominent gonadal veins again demonstrated. No significantly enlarged lymph nodes. Reproductive: No adnexal masses. A small enhancing focus in the central low pelvis may reflect a small enhancing fibroid, present on comparison study. Other: Negative for free air or free fluid. Musculoskeletal: No acute or significant osseous findings. IMPRESSION: 1. Negative for obstruction. There are postsurgical changes of the bowel. Questionable mild wall thickening of left lower  quadrant small bowel loops as may be seen with enteritis. 2. 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 3. 14 mm right kidney upper pole lesion, slight increase in size and intermediate in density. Could further evaluate with non emergent MRI as indicated 4. Stable intra and extrahepatic biliary dilatation post cholecystectomy. Electronically Signed   By: Donavan Foil M.D.   On: 09/08/2016 00:59    EKG:   Orders placed or performed during the hospital encounter of 09/07/16  . ED EKG  . ED EKG  . ED EKG  . ED EKG  . EKG 12-Lead  . EKG 12-Lead    ASSESSMENT AND PLAN:   61 year old female patient with history of COPD and bowel obstruction in the past presented to the emergency room with abdominal pain nausea vomiting and diarrhea.   1. Acute gastroenteritis secondary to enter pathogenic Escherichia coli On isolation Supportive treatment with IV fluids Ciprofloxacin AWAIT clinical improvement  2. Severe hypokalemia-replete and recheck. Magnesium in the normal range  3. Emphysema/COPD-stable no exacerbation, provide nebulizer treatments  4. Protein energy malnutrition-dietary consult  5. Tobacco abuse disorder; counseled patient to quit smoking for 5 minutes, she verbalized understanding will provide nicotine patch     All the records are reviewed and case discussed with Care Management/Social Workerr. Management plans discussed with the patient, family and they are in agreement.  CODE STATUS: FC   TOTAL TIME TAKING CARE OF THIS PATIENT: 36  minutes.   POSSIBLE D/C IN 1-2 DAYS, DEPENDING ON CLINICAL CONDITION.  Note: This dictation was prepared with Dragon dictation along with smaller phrase technology. Any transcriptional errors that result from this process are unintentional.   Nicholes Mango M.D on 09/08/2016 at 4:05 PM  Between 7am to 6pm - Pager - [phone] After 6pm go to [email or web address] - password [email or web address] [facility]  Office  [phone]  CC: Primary care physician; Cletis Athens, MD

## 2016-09-08 NOTE — Progress Notes (Signed)
MEDICATION RELATED CONSULT NOTE - INITIAL   Pharmacy Consult for electrolyte management Indication: hypokalemia  Allergies  Allergen Reactions  . Aspirin Hives and Itching    Patient Measurements: Height: 5\' 5"  (165.1 cm) Weight: 95 lb (43.1 kg) IBW/kg (Calculated) : 57 Adjusted Body Weight:   Vital Signs: Temp: 97.9 F (36.6 C) (08/16 0741) Temp Source: Oral (08/16 0741) BP: 132/41 (08/16 0741) Pulse Rate: 54 (08/16 0741) Intake/Output from previous day: No intake/output data recorded. Intake/Output from this shift: No intake/output data recorded.  Labs:  Recent Labs  09/07/16 2138 09/08/16 0411  WBC 9.5 9.5  HGB 13.0 12.2  HCT 38.2 35.9  PLT 246 240  CREATININE 0.42* 0.47  MG  --  1.7  ALBUMIN 3.3*  --   PROT 5.8*  --   AST 30  --   ALT 27  --   ALKPHOS 75  --   BILITOT 0.5  --    Estimated Creatinine Clearance: 51.5 mL/min (by C-G formula based on SCr of 0.47 mg/dL).   Microbiology: Recent Results (from the past 720 hour(s))  Gastrointestinal Panel by PCR , Stool     Status: Abnormal   Collection Time: 09/07/16  9:38 PM  Result Value Ref Range Status   Campylobacter species NOT DETECTED NOT DETECTED Final   Plesimonas shigelloides NOT DETECTED NOT DETECTED Final   Salmonella species NOT DETECTED NOT DETECTED Final   Yersinia enterocolitica NOT DETECTED NOT DETECTED Final   Vibrio species NOT DETECTED NOT DETECTED Final   Vibrio cholerae NOT DETECTED NOT DETECTED Final   Enteroaggregative E coli (EAEC) NOT DETECTED NOT DETECTED Final   Enteropathogenic E coli (EPEC) DETECTED (A) NOT DETECTED Final    Comment: CRITICAL RESULT CALLED TO, READ BACK BY AND VERIFIED WITH: SAMANTHA HAMILTON AT 2337 09/07/16 ALV    Enterotoxigenic E coli (ETEC) NOT DETECTED NOT DETECTED Final   Shiga like toxin producing E coli (STEC) NOT DETECTED NOT DETECTED Final   Shigella/Enteroinvasive E coli (EIEC) NOT DETECTED NOT DETECTED Final   Cryptosporidium NOT DETECTED NOT  DETECTED Final   Cyclospora cayetanensis NOT DETECTED NOT DETECTED Final   Entamoeba histolytica NOT DETECTED NOT DETECTED Final   Giardia lamblia NOT DETECTED NOT DETECTED Final   Adenovirus F40/41 NOT DETECTED NOT DETECTED Final   Astrovirus NOT DETECTED NOT DETECTED Final   Norovirus GI/GII NOT DETECTED NOT DETECTED Final   Rotavirus A NOT DETECTED NOT DETECTED Final   Sapovirus (I, II, IV, and V) NOT DETECTED NOT DETECTED Final  C difficile quick scan w PCR reflex     Status: None   Collection Time: 09/07/16  9:38 PM  Result Value Ref Range Status   C Diff antigen NEGATIVE NEGATIVE Final   C Diff toxin NEGATIVE NEGATIVE Final   C Diff interpretation No C. difficile detected.  Final    Medical History: Past Medical History:  Diagnosis Date  . Bowel obstruction (HCC)    x1 month ago  . COPD (chronic obstructive pulmonary disease) (HCC)     Medications:  Infusions:  . 0.9 % NaCl with KCl 40 mEq / L    . magnesium sulfate 1 - 4 g bolus IVPB    . potassium chloride      Assessment: 61 yof cc abdominal pain with low potassium level on admission. PMH bowel obstruction one month ago and COPD. Pharmacy consulted to manage electrolytes. Patient is NPO at this time, and CrCl approximately 50 to 55 mL/min.   8/16 K 2.6,  Ca 7.8, albumin 3.3, adjusted Ca 8.4, Mg 1.7, Phos NA  Goal of Therapy:  K 3.5 to 5 Ca 8.9 to 10.3 Mg 1.8 to 2.4 Phos 2.5 to 4.6  Plan:  Patient has received 10 mEq IV Q1H x 4 doses (last 2 doses were given after K drawn this AM). She remains on NS with KCl 40 mEq/L at 75 mL/hr.  1. Give magnesium sulfate 2 gm IV x 1 2. Give additional potassium chloride 10 mEq IV Q2H x 2 doses 3. Give calcium gluconate 1 gm IV x 1 4. Recheck BMP this evening 5. Recheck all electrolytes tomorrow with AM labs.   Laural Benes, Pharm.D., BCPS Clinical Pharmacist 09/08/2016,8:59 AM

## 2016-09-09 DIAGNOSIS — E46 Unspecified protein-calorie malnutrition: Secondary | ICD-10-CM

## 2016-09-09 DIAGNOSIS — A044 Other intestinal Escherichia coli infections: Secondary | ICD-10-CM

## 2016-09-09 DIAGNOSIS — Z716 Tobacco abuse counseling: Secondary | ICD-10-CM

## 2016-09-09 DIAGNOSIS — E876 Hypokalemia: Secondary | ICD-10-CM | POA: Diagnosis not present

## 2016-09-09 LAB — BASIC METABOLIC PANEL
Anion gap: 4 — ABNORMAL LOW (ref 5–15)
BUN: 10 mg/dL (ref 6–20)
CO2: 28 mmol/L (ref 22–32)
Calcium: 7.9 mg/dL — ABNORMAL LOW (ref 8.9–10.3)
Chloride: 115 mmol/L — ABNORMAL HIGH (ref 101–111)
Creatinine, Ser: 0.46 mg/dL (ref 0.44–1.00)
GFR calc Af Amer: >60 mL/min (ref >60)
GFR calc non Af Amer: >60 mL/min (ref >60)
Glucose, Bld: 92 mg/dL (ref 65–99)
Potassium: 4.2 mmol/L (ref 3.5–5.1)
Sodium: 147 mmol/L — ABNORMAL HIGH (ref 135–145)

## 2016-09-09 LAB — HIV ANTIBODY (ROUTINE TESTING W REFLEX): HIV Screen 4th Generation wRfx: NONREACTIVE

## 2016-09-09 LAB — PHOSPHORUS: Phosphorus: 3.2 mg/dL (ref 2.5–4.6)

## 2016-09-09 LAB — MAGNESIUM: Magnesium: 1.9 mg/dL (ref 1.7–2.4)

## 2016-09-09 MED ORDER — LISINOPRIL 20 MG PO TABS: 40.0000 mg | ORAL_TABLET | Freq: Once | ORAL | Status: DC

## 2016-09-09 MED ORDER — CIPROFLOXACIN HCL 500 MG PO TABS: 500.0000 mg | ORAL_TABLET | Freq: Two times a day (BID) | ORAL | 0 refills | Status: DC

## 2016-09-09 MED ORDER — NICOTINE 14 MG/24HR TD PT24: 14.0000 mg | MEDICATED_PATCH | Freq: Every day | TRANSDERMAL | 0 refills | Status: DC

## 2016-09-09 MED ORDER — SODIUM CHLORIDE 0.9 % IV SOLN
INTRAVENOUS | Status: DC
  Administered 2016-09-09: 08:00:00 via INTRAVENOUS

## 2016-09-09 MED ORDER — ENSURE ENLIVE PO LIQD: 237.0000 mL | Freq: Two times a day (BID) | ORAL | 12 refills | Status: AC

## 2016-09-09 MED ORDER — SODIUM CHLORIDE 0.9 % IV SOLN
1.0000 g | Freq: Once | INTRAVENOUS | Status: AC
  Administered 2016-09-09: 1 g via INTRAVENOUS
  Filled 2016-09-09: qty 10

## 2016-09-09 MED ORDER — ALBUTEROL SULFATE (2.5 MG/3ML) 0.083% IN NEBU: 2.5000 mg | INHALATION_SOLUTION | Freq: Three times a day (TID) | RESPIRATORY_TRACT | 12 refills | Status: DC | PRN

## 2016-09-09 MED ORDER — PANTOPRAZOLE SODIUM 40 MG PO TBEC: 40.0000 mg | DELAYED_RELEASE_TABLET | Freq: Every day | ORAL | 1 refills | Status: DC

## 2016-09-09 NOTE — Discharge Instructions (Signed)

## 2016-09-09 NOTE — Progress Notes (Signed)
MEDICATION RELATED CONSULT NOTE - INITIAL   Pharmacy Consult for electrolyte management Indication: hypokalemia  Allergies  Allergen Reactions  . Aspirin Hives and Itching    Patient Measurements: Height: 5\' 5"  (165.1 cm) Weight: 273 lb (123.8 kg) IBW/kg (Calculated) : 57 Adjusted Body Weight:   Vital Signs: Temp: 98 F (36.7 C) (08/16 1949) Temp Source: Oral (08/16 1949) BP: 157/61 (08/17 0411) Pulse Rate: 67 (08/17 0411) Intake/Output from previous day: 08/16 0701 - 08/17 0700 In: 1998.8 [P.O.:240; I.V.:1398.8; IV Piggyback:360] Out: 0  Intake/Output from this shift: No intake/output data recorded.  Labs:  Recent Labs  09/07/16 2138 09/08/16 0411 09/08/16 1811 09/09/16 0411  WBC 9.5 9.5  --   --   HGB 13.0 12.2  --   --   HCT 38.2 35.9  --   --   PLT 246 240  --   --   CREATININE 0.42* 0.47 0.46 0.46  MG  --  1.7  --  1.9  PHOS  --   --   --  3.2  ALBUMIN 3.3*  --   --   --   PROT 5.8*  --   --   --   AST 30  --   --   --   ALT 27  --   --   --   ALKPHOS 75  --   --   --   BILITOT 0.5  --   --   --    Estimated Creatinine Clearance: 100 mL/min (by C-G formula based on SCr of 0.46 mg/dL).   Microbiology: Recent Results (from the past 720 hour(s))  Gastrointestinal Panel by PCR , Stool     Status: Abnormal   Collection Time: 09/07/16  9:38 PM  Result Value Ref Range Status   Campylobacter species NOT DETECTED NOT DETECTED Final   Plesimonas shigelloides NOT DETECTED NOT DETECTED Final   Salmonella species NOT DETECTED NOT DETECTED Final   Yersinia enterocolitica NOT DETECTED NOT DETECTED Final   Vibrio species NOT DETECTED NOT DETECTED Final   Vibrio cholerae NOT DETECTED NOT DETECTED Final   Enteroaggregative E coli (EAEC) NOT DETECTED NOT DETECTED Final   Enteropathogenic E coli (EPEC) DETECTED (A) NOT DETECTED Final    Comment: CRITICAL RESULT CALLED TO, READ BACK BY AND VERIFIED WITH: SAMANTHA HAMILTON AT 2337 09/07/16 ALV    Enterotoxigenic E  coli (ETEC) NOT DETECTED NOT DETECTED Final   Shiga like toxin producing E coli (STEC) NOT DETECTED NOT DETECTED Final   Shigella/Enteroinvasive E coli (EIEC) NOT DETECTED NOT DETECTED Final   Cryptosporidium NOT DETECTED NOT DETECTED Final   Cyclospora cayetanensis NOT DETECTED NOT DETECTED Final   Entamoeba histolytica NOT DETECTED NOT DETECTED Final   Giardia lamblia NOT DETECTED NOT DETECTED Final   Adenovirus F40/41 NOT DETECTED NOT DETECTED Final   Astrovirus NOT DETECTED NOT DETECTED Final   Norovirus GI/GII NOT DETECTED NOT DETECTED Final   Rotavirus A NOT DETECTED NOT DETECTED Final   Sapovirus (I, II, IV, and V) NOT DETECTED NOT DETECTED Final  C difficile quick scan w PCR reflex     Status: None   Collection Time: 09/07/16  9:38 PM  Result Value Ref Range Status   C Diff antigen NEGATIVE NEGATIVE Final   C Diff toxin NEGATIVE NEGATIVE Final   C Diff interpretation No C. difficile detected.  Final    Medical History: Past Medical History:  Diagnosis Date  . Bowel obstruction (HCC)    x1 month ago  .  COPD (chronic obstructive pulmonary disease) (HCC)     Medications:  Infusions:  . 0.9 % NaCl with KCl 40 mEq / L 75 mL/hr (09/09/16 0024)    Assessment: 61 yof cc abdominal pain with low potassium level on admission. PMH bowel obstruction one month ago and COPD. Pharmacy consulted to manage electrolytes. Patient is NPO at this time, and CrCl approximately 50 to 55 mL/min.   8/16 K 2.6, Ca 7.8, albumin 3.3, adjusted Ca 8.4, Mg 1.7, Phos NA  Goal of Therapy:  K 3.5 to 5 Ca 8.9 to 10.3 Mg 1.8 to 2.4 Phos 2.5 to 4.6  Plan:  Patient has received 10 mEq IV Q1H x 4 doses (last 2 doses were given after K drawn this AM). She remains on NS with KCl 40 mEq/L at 75 mL/hr.  1. Give magnesium sulfate 2 gm IV x 1 2. Give additional potassium chloride 10 mEq IV Q2H x 2 doses 3. Give calcium gluconate 1 gm IV x 1 4. Recheck BMP this evening 5. Recheck all electrolytes tomorrow  with AM labs.   8/17 0411 K 4.2, Ca 7.9, adjusted Ca 8.5, Phos 3.2, Mg 1.9.  1. Give calcium gluconate 1 gm IV x 1 2. Recheck all electrolytes tomorrow with AM labs.  Laural Benes, Pharm.D., BCPS Clinical Pharmacist 09/09/2016,7:05 AM

## 2016-09-09 NOTE — Evaluation (Signed)
Physical Therapy Evaluation Patient Details Name: [name] [name] MRN: [id number] DOB: May 23, 1955 Today's Date: 09/09/2016   History of Present Illness  Asharia Lotter  is a 61 y.o. female with a known history of bowel obstruction, emphysema presented to the emergency room with abdominal pain and diarrhea. Pain going on for the last few days. Abdominal pain is aching in nature 5 out of 10 on a scale of 1-10 located around umbilicus. The diarrhea is loose watery stool with no blood. Patient has some nausea. Patient was tested for C. Difficile toxin which was negative. She was worked up with CT abdomen which showed no obstruction but mild enteritis. No recent travel. No complaints of any chest pain, shortness of breath. Hospitalist service was consulted for further care of the patient. Workup in the emergency room showed low potassium level of 2.3. Pt now admitted for acute gastroenteritis and hypokalemia.  Clinical Impression  Pt admitted with above diagnosis. Pt currently with functional limitations due to the deficits listed below (see PT Problem List).  Pt is independent with bed mobility and transfers. She ambules a full lap around RN station with therapist and supervision only. VSS throughout ambulation and pt denies DOE. No instability noted with use of rollator for ambulation. She is able to maintain feet apart/together without LOB. Positive Rhomberg. Single leg balance approximately 1 second bilaterally. Pt reports numbness/tingling and weakness throughout LUE/LLE for the last 2 years. No reported work-up per patient. LLE weakness appreciated with MMT. She would benefit from OP PT to work on balance/strengthening. She would also benefit from a rolling walker as her rollator is too wide to use easily in her home. Pt will benefit from PT services to address deficits in strength, balance, and mobility in order to return to full function at home.      Follow Up Recommendations Outpatient PT;Other (comment)  (For balance)    Equipment Recommendations  Rolling walker with 5" wheels;Other (comment) (To use inside house, rollator too large for her home)    Recommendations for Other Services       Precautions / Restrictions Precautions Precautions: None Restrictions Weight Bearing Restrictions: No      Mobility  Bed Mobility Overal bed mobility: Independent             General bed mobility comments: No bed rails, good sequencing  Transfers Overall transfer level: Independent Equipment used: None             General transfer comment: Pt able to transfer from bed and commode without assist. Safe hand placement  Ambulation/Gait Ambulation/Gait assistance: Supervision Ambulation Distance (Feet): 220 Feet Assistive device: 4-wheeled walker Gait Pattern/deviations: Step-through pattern Gait velocity: WFL for limited community mobility Gait velocity interpretation: Below normal speed for age/gender (Mildly decreased but functional) General Gait Details: Pt ambules a full lap around RN station with therapist. VSS throughout ambulation and pt denies DOE. No instability noted with use of rollator for ambulation.   Stairs            Wheelchair Mobility    Modified Rankin (Stroke Patients Only)       Balance Overall balance assessment: Needs assistance Sitting-balance support: No upper extremity supported Sitting balance-Leahy Scale: Normal     Standing balance support: No upper extremity supported Standing balance-Leahy Scale: Fair Standing balance comment: Able to maintain feet apart/together without LOB. Positive Rhomberg. Single leg balance approximately 1 second bilaterally  Pertinent Vitals/Pain Pain Assessment: No/denies pain    Home Living Family/patient expects to be discharged to:: Private residence Living Arrangements: Other (Comment) (Grandson stays with her) Available Help at Discharge: Family;Other  (Comment) (Brother and grandson available to help) Type of Home: House Home Access: Stairs to enter Entrance Stairs-Rails: Left Entrance Stairs-Number of Steps: 2 Home Layout: One level Home Equipment: Cane - single point;Walker - 4 wheels;Bedside commode;Shower seat (no front wheeled walker)      Prior Function Level of Independence: Needs assistance   Gait / Transfers Assistance Needed: Ambulates with rollator community distances. No falls   ADL's / Homemaking Assistance Needed: Independent with ADLs, assist with IADLs, HH aid 2-3 hours/day, 7d/wk        Hand Dominance   Dominant Hand: Left    Extremity/Trunk Assessment   Upper Extremity Assessment Upper Extremity Assessment: Overall WFL for tasks assessed    Lower Extremity Assessment Lower Extremity Assessment: LLE deficits/detail LLE Deficits / Details: 4-/5 L hip and knee flexion. Pt reports numbness/tingling throughout entire LUE/LLE for the last 2 years. No clear diagnosis per patient       Communication   Communication: No difficulties  Cognition Arousal/Alertness: Awake/alert Behavior During Therapy: WFL for tasks assessed/performed Overall Cognitive Status: Within Functional Limits for tasks assessed                                        General Comments      Exercises     Assessment/Plan    PT Assessment Patient needs continued PT services  PT Problem List Decreased strength;Decreased balance       PT Treatment Interventions DME instruction;Gait training;Stair training;Therapeutic activities;Therapeutic exercise;Balance training;Neuromuscular re-education;Patient/family education    PT Goals (Current goals can be found in the Care Plan section)  Acute Rehab PT Goals Patient Stated Goal: Return to prior function at home PT Goal Formulation: With patient Time For Goal Achievement: 09/23/16 Potential to Achieve Goals: Good    Frequency Min 2X/week   Barriers to discharge         Co-evaluation               AM-PAC PT "6 Clicks" Daily Activity  Outcome Measure Difficulty turning over in bed (including adjusting bedclothes, sheets and blankets)?: None Difficulty moving from lying on back to sitting on the side of the bed? : None Difficulty sitting down on and standing up from a chair with arms (e.g., wheelchair, bedside commode, etc,.)?: None Help needed moving to and from a bed to chair (including a wheelchair)?: None Help needed walking in hospital room?: None Help needed climbing 3-5 steps with a railing? : A Little 6 Click Score: 23    End of Session Equipment Utilized During Treatment: Gait belt Activity Tolerance: Patient tolerated treatment well Patient left: in bed;with call bell/phone within reach   PT Visit Diagnosis: Unsteadiness on feet (R26.81);Muscle weakness (generalized) (M62.81)    Time: 7253-6644 PT Time Calculation (min) (ACUTE ONLY): 16 min   Charges:   PT Evaluation $PT Eval Low Complexity: 1 Low     PT G Codes:   PT G-Codes **NOT FOR INPATIENT CLASS** Functional Assessment Tool Used: AM-PAC 6 Clicks Basic Mobility Functional Limitation: Mobility: Walking and moving around Mobility: Walking and Moving Around Current Status (I3474): At least 1 percent but less than 20 percent impaired, limited or restricted Mobility: Walking and Moving Around Goal Status (  G8979): At least 1 percent but less than 20 percent impaired, limited or restricted    Phillips Grout PT, DPT    Emely Fahy 09/09/2016, 11:18 AM

## 2016-09-09 NOTE — Progress Notes (Signed)
Called patient's pharmacy about patient dispute that not only is her pharmacy closed but that they don't have any of her medications there available for pick-up. Not only are they open until 7P but this R.N. called and verified that pharmacy staff have received the E-Scribe orders and that they will be available by the close of business today. Medications NOT in the room and NOT in the pharmacy. No documentation for these medications ever being sent to the pharmacy exists that this RN could find. This note was written after the patient was formally discharged. Wenda Low [facility]

## 2016-09-09 NOTE — Care Management (Signed)
CM was contacted by the patient advocate in the ED.  Patient discharged and went to the ED telling staff that her medications she brought to the hospital are now missing.  After much investigation it turns out patients medications are at her home.

## 2016-09-09 NOTE — Care Management Note (Addendum)
Case Management Note  Patient Details  Name: [name] [name] MRN: [id number] Date of Birth: 1955/04/01  Subjective/Objective:                 Consult for medication needs.  Patient has medicare, medicare part D and medicaid.  She pays between 3 and 8 dollars copay for her medications. Family to transport home at discharge   Action/Plan:   Informed patient that there is nothing more caremanager can do to assist with copays as she is already receiving significant assist.  Discussed reviewing her current medication list with her PCP to determine if all meds are necessary.  Also advised could use her financial resources that she is spending on cigarettes towards her medications   Expected Discharge Date:                  Expected Discharge Plan:     In-House Referral:     Discharge planning Services     Post Acute Care Choice:    Choice offered to:     DME Arranged:    DME Agency:     HH Arranged:    [place] Agency:     Status of Service:     If discussed at [employer], dates discussed:    Additional Comments:  Katrina Stack, RN 09/09/2016, 9:28 AM

## 2016-09-09 NOTE — Plan of Care (Signed)
Problem: Fluid Volume: Goal: Ability to maintain a balanced intake and output will improve Outcome: Progressing Patient drinking ordered Ensure multiple times per day. [name] [name] [facility]

## 2016-09-09 NOTE — Care Management Important Message (Signed)
Important Message  Patient Details  Name: [name] [name] MRN: [id number] Date of Birth: 1955-03-18   Medicare Important Message Given:  N/A - LOS <3 / Initial given by admissions    Katrina Stack, RN 09/09/2016, 2:30 PM

## 2016-09-09 NOTE — Discharge Summary (Signed)
[facility] [place] NAME: Tyna Huertas    MR#:  [id number]  DATE OF BIRTH:  December 17, 1955  DATE OF ADMISSION:  09/07/2016 ADMITTING PHYSICIAN: Saundra Shelling, MD  DATE OF DISCHARGE: No discharge date for patient encounter.  PRIMARY CARE PHYSICIAN: Cletis Athens, MD     ADMISSION DIAGNOSIS:  Hypokalemia [E87.6] Generalized abdominal pain [R10.84] Left-sided weakness [R53.1] Left sided numbness [R20.0] Diarrhea, unspecified type [R19.7]  DISCHARGE DIAGNOSIS:  Active Problems:   Hypokalemia   E. coli gastroenteritis   Protein calorie malnutrition ([place])   Tobacco abuse counseling   SECONDARY DIAGNOSIS:   Past Medical History:  Diagnosis Date  . Bowel obstruction (HCC)    x1 month ago  . COPD (chronic obstructive pulmonary disease) ([place])     .pro HOSPITAL COURSE:   Patient is 61 year old female with a history significant for history of bowel obstruction, emphysema, ongoing tobacco abuse, who presented to the hospital with complaints of abdominal pain and diarrhea for the past few days. Pain was described as achiness, localized around the umbilicus, diarrhea was loose, watery, no blood was noted. Patient has some nausea. Stool cultures revealed enteropathogenic Escherichia coli, C. difficile was negative. Patient was admitted to the hospital for further evaluation and treatment, her labs showed significant hypokalemia with potassium level of 2.3. Patient was rehydrated, potassium was replenished, as well as magnesium, both normalized on the day of discharge. The patient's diet was advanced and she tolerated it well. She was advised to continue ciprofloxacin orally twice daily for the next 5 days to complete course. Discussion by problem: #1. Acute gastroenteritis due to enteropathogenic Escherichia coli, continue ciprofloxacin orally for 5 more days to complete course, clinically improved, now on regular diet and tolerated this  well. Patient is initiated on Protonix, higher dose than Prilosec the patient was getting at home.  #2. Severe hypokalemia, repleted, magnesium level was also relatively low, repleted.  #3. Emphysema/COPD, no exacerbation, continued home therapy #4, protein calorie malnutrition, dietary consultation was obtained, patient was advised to continue supplements with ensure #5. Tobacco abuse. Counseling, discussed this patient for 5 minutes, nicotine replacement therapy was recommended. #6. Essential hypertension, poorly controlled, resume Zestril, but not HCTZ due to hypokalemia DISCHARGE CONDITIONS:   Stable  DRUG ALLERGIES:   Allergies  Allergen Reactions  . Aspirin Hives and Itching    DISCHARGE MEDICATIONS:   Current Discharge Medication List    START taking these medications   Details  ciprofloxacin (CIPRO) 500 MG tablet Take 1 tablet (500 mg total) by mouth 2 (two) times daily. Qty: 10 tablet, Refills: 0    feeding supplement, ENSURE ENLIVE, (ENSURE ENLIVE) LIQD Take 237 mLs by mouth 2 (two) times daily between meals. Qty: 237 mL, Refills: 12    nicotine (NICODERM CQ - DOSED IN MG/24 HOURS) 14 mg/24hr patch Place 1 patch (14 mg total) onto the skin daily. Qty: 28 patch, Refills: 0    pantoprazole (PROTONIX) 40 MG tablet Take 1 tablet (40 mg total) by mouth daily. Qty: 30 tablet, Refills: 1      CONTINUE these medications which have CHANGED   Details  albuterol (PROVENTIL) (2.5 MG/3ML) 0.083% nebulizer solution Take 3 mLs (2.5 mg total) by nebulization 3 (three) times daily as needed for wheezing. Qty: 75 mL, Refills: 12      CONTINUE these medications which have NOT CHANGED   Details  atorvastatin (LIPITOR) 40 MG tablet Take 40 mg by mouth daily.  budesonide-formoterol (SYMBICORT) 160-4.5 MCG/ACT inhaler Inhale 2 puffs into the lungs 2 (two) times daily.    cycloSPORINE (RESTASIS) 0.05 % ophthalmic emulsion Place 1 drop into both eyes every 12 (twelve) hours.      fluticasone (FLONASE) 50 MCG/ACT nasal spray Place 2 sprays into both nostrils daily as needed for rhinitis.    lisinopril (PRINIVIL,ZESTRIL) 40 MG tablet Take 40 mg by mouth daily.    mirtazapine (REMERON) 15 MG tablet Take 15 mg by mouth daily.    pregabalin (LYRICA) 100 MG capsule Take 100 mg by mouth 3 (three) times daily.     Vitamin D, Ergocalciferol, (DRISDOL) 50000 units CAPS capsule Take 50,000 Units by mouth every 7 (seven) days.    acetaminophen (TYLENOL) 160 MG/5ML solution Take 160 mg by mouth every 6 (six) hours as needed for mild pain.     benzonatate (TESSALON PERLES) 100 MG capsule Take 1 capsule (100 mg total) by mouth 3 (three) times daily as needed for cough. Qty: 30 capsule, Refills: 0    ondansetron (ZOFRAN) 4 MG tablet Take 1 tablet (4 mg total) by mouth every 6 (six) hours as needed for nausea. Qty: 20 tablet, Refills: 0    Water For Irrigation, Sterile (FREE WATER) SOLN Place 60 mLs into feeding tube every 8 (eight) hours. Qty: 60 mL, Refills: 0      STOP taking these medications     hydrochlorothiazide (HYDRODIURIL) 25 MG tablet      omeprazole (PRILOSEC) 20 MG capsule      albuterol (ACCUNEB) 1.25 MG/3ML nebulizer solution          DISCHARGE INSTRUCTIONS:    The patient is to follow-up with primary care physician within one week after discharge, it is recommended to follow her potassium, magnesium level as outpatient to ensure stability  f you experience worsening of your admission symptoms, develop shortness of breath, life threatening emergency, suicidal or homicidal thoughts you must seek medical attention immediately by calling 911 or calling your MD immediately  if symptoms less severe.  You Must read complete instructions/literature along with all the possible adverse reactions/side effects for all the Medicines you take and that have been prescribed to you. Take any new Medicines after you have completely understood and accept all the  possible adverse reactions/side effects.   Please note  You were cared for by a hospitalist during your hospital stay. If you have any questions about your discharge medications or the care you received while you were in the hospital after you are discharged, you can call the unit and asked to speak with the hospitalist on call if the hospitalist that took care of you is not available. Once you are discharged, your primary care physician will handle any further medical issues. Please note that NO REFILLS for any discharge medications will be authorized once you are discharged, as it is imperative that you return to your primary care physician (or establish a relationship with a primary care physician if you do not have one) for your aftercare needs so that they can reassess your need for medications and monitor your lab values.    Today   CHIEF COMPLAINT:   Chief Complaint  Patient presents with  . Abdominal Pain    HISTORY OF PRESENT ILLNESS:     VITAL SIGNS:  Blood pressure (!) 180/67, pulse 63, temperature 98 F (36.7 C), temperature source Oral, resp. rate 19, height 5\' 5"  (1.651 m), weight 123.8 kg (273 lb), SpO2 100 %.  I/O:  Intake/Output Summary (Last 24 hours) at 09/09/16 1422 Last data filed at 09/09/16 1303  Gross per 24 hour  Intake          2668.75 ml  Output                0 ml  Net          2668.75 ml    PHYSICAL EXAMINATION:  GENERAL:  61 y.o.-year-old patient lying in the bed with no acute distress.  EYES: Pupils equal, round, reactive to light and accommodation. No scleral icterus. Extraocular muscles intact.  HEENT: Head atraumatic, normocephalic. Oropharynx and nasopharynx clear.  NECK:  Supple, no jugular venous distention. No thyroid enlargement, no tenderness.  LUNGS: Normal breath sounds bilaterally, no wheezing, rales,rhonchi or crepitation. No use of accessory muscles of respiration.  CARDIOVASCULAR: S1, S2 normal. No murmurs, rubs, or gallops.    ABDOMEN: Soft, non-tender, non-distended. Bowel sounds present. No organomegaly or mass.  EXTREMITIES: No pedal edema, cyanosis, or clubbing.  NEUROLOGIC: Cranial nerves II through XII are intact. Muscle strength 5/5 in all extremities. Sensation intact. Gait not checked.  PSYCHIATRIC: The patient is alert and oriented x 3.  SKIN: No obvious rash, lesion, or ulcer.   DATA REVIEW:   CBC  Recent Labs Lab 09/08/16 0411  WBC 9.5  HGB 12.2  HCT 35.9  PLT 240    Chemistries   Recent Labs Lab 09/07/16 2138  09/09/16 0411  NA 144  < > 147*  K 2.3*  < > 4.2  CL 108  < > 115*  CO2 31  < > 28  GLUCOSE 94  < > 92  BUN 10  < > 10  CREATININE 0.42*  < > 0.46  CALCIUM 8.1*  < > 7.9*  MG  --   < > 1.9  AST 30  --   --   ALT 27  --   --   ALKPHOS 75  --   --   BILITOT 0.5  --   --   < > = values in this interval not displayed.  Cardiac Enzymes  Recent Labs Lab 09/07/16 2138  TROPONINI 0.03*    Microbiology Results  Results for orders placed or performed during the hospital encounter of 09/07/16  Gastrointestinal Panel by PCR , Stool     Status: Abnormal   Collection Time: 09/07/16  9:38 PM  Result Value Ref Range Status   Campylobacter species NOT DETECTED NOT DETECTED Final   Plesimonas shigelloides NOT DETECTED NOT DETECTED Final   Salmonella species NOT DETECTED NOT DETECTED Final   Yersinia enterocolitica NOT DETECTED NOT DETECTED Final   Vibrio species NOT DETECTED NOT DETECTED Final   Vibrio cholerae NOT DETECTED NOT DETECTED Final   Enteroaggregative E coli (EAEC) NOT DETECTED NOT DETECTED Final   Enteropathogenic E coli (EPEC) DETECTED (A) NOT DETECTED Final    Comment: CRITICAL RESULT CALLED TO, READ BACK BY AND VERIFIED WITH: SAMANTHA HAMILTON AT 2337 09/07/16 ALV    Enterotoxigenic E coli (ETEC) NOT DETECTED NOT DETECTED Final   Shiga like toxin producing E coli (STEC) NOT DETECTED NOT DETECTED Final   Shigella/Enteroinvasive E coli (EIEC) NOT DETECTED  NOT DETECTED Final   Cryptosporidium NOT DETECTED NOT DETECTED Final   Cyclospora cayetanensis NOT DETECTED NOT DETECTED Final   Entamoeba histolytica NOT DETECTED NOT DETECTED Final   Giardia lamblia NOT DETECTED NOT DETECTED Final   Adenovirus F40/41 NOT DETECTED NOT DETECTED Final   Astrovirus NOT DETECTED NOT  DETECTED Final   Norovirus GI/GII NOT DETECTED NOT DETECTED Final   Rotavirus A NOT DETECTED NOT DETECTED Final   Sapovirus (I, II, IV, and V) NOT DETECTED NOT DETECTED Final  C difficile quick scan w PCR reflex     Status: None   Collection Time: 09/07/16  9:38 PM  Result Value Ref Range Status   C Diff antigen NEGATIVE NEGATIVE Final   C Diff toxin NEGATIVE NEGATIVE Final   C Diff interpretation No C. difficile detected.  Final    RADIOLOGY:  Ct Head Wo Contrast  Result Date: 09/07/2016 CLINICAL DATA:  Generalized abdominal pain, nausea. LEFT extremity weakness for 1 month. Assess for stroke. EXAM: CT HEAD WITHOUT CONTRAST TECHNIQUE: Contiguous axial images were obtained from the base of the skull through the vertex without intravenous contrast. COMPARISON:  CT HEAD October 28, 2013 FINDINGS: BRAIN: No intraparenchymal hemorrhage, mass effect nor midline shift. The ventricles and sulci are normal for age. No acute large vascular territory infarcts. No abnormal extra-axial fluid collections. Basal cisterns are patent. VASCULAR: Trace calcific atherosclerosis of the carotid siphons. SKULL: No skull fracture. No significant scalp soft tissue swelling. SINUSES/ORBITS: The mastoid air-cells and included paranasal sinuses are well-aerated.The included ocular globes and orbital contents are non-suspicious. OTHER: None. IMPRESSION: Stable negative CT HEAD for age. Electronically Signed   By: Elon Alas M.D.   On: 09/07/2016 22:11   Ct Abdomen Pelvis W Contrast  Result Date: 09/08/2016 CLINICAL DATA:  Nonbloody diarrhea EXAM: CT ABDOMEN AND PELVIS WITH CONTRAST TECHNIQUE:  Multidetector CT imaging of the abdomen and pelvis was performed using the standard protocol following bolus administration of intravenous contrast. CONTRAST:  31mL ISOVUE-300 IOPAMIDOL (ISOVUE-300) INJECTION 61% COMPARISON:  07/11/2014 FINDINGS: Lower chest: Patchy dependent atelectasis at the bilateral lung bases. Borderline cardiomegaly. Hepatobiliary: Mild intra and extrahepatic biliary dilatation status post cholecystectomy. No change. Pancreas: Unremarkable. No pancreatic ductal dilatation or surrounding inflammatory changes. Spleen: Normal in size without focal abnormality. Adrenals/Urinary Tract: Adrenal glands are within normal limits. subcentimeter hypodense lesions in the kidneys too small to further characterize. Increased size of upper pole lesion right kidney now measuring 14 mm and contains internal increased density. Bladder unremarkable. Stomach/Bowel: Stomach nonenlarged. Postsurgical changes in the left upper quadrant and upper pelvis. No convincing evidence for obstruction as there is distal contrast present. Possible mild left lower quadrant small bowel thickening. Negative for pneumatosis. Vascular/Lymphatic: Aortic atherosclerotic calcifications. Chronic occlusion of the celiac and SMA origins. Prominent gonadal veins again demonstrated. No significantly enlarged lymph nodes. Reproductive: No adnexal masses. A small enhancing focus in the central low pelvis may reflect a small enhancing fibroid, present on comparison study. Other: Negative for free air or free fluid. Musculoskeletal: No acute or significant osseous findings. IMPRESSION: 1. Negative for obstruction. There are postsurgical changes of the bowel. Questionable mild wall thickening of left lower quadrant small bowel loops as may be seen with enteritis. 2. 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 3. 14 mm right kidney upper pole lesion, slight increase in size and intermediate in density. Could further  evaluate with non emergent MRI as indicated 4. Stable intra and extrahepatic biliary dilatation post cholecystectomy. Electronically Signed   By: Donavan Foil M.D.   On: 09/08/2016 00:59    EKG:   Orders placed or performed during the hospital encounter of 09/07/16  . ED EKG  . ED EKG  . ED EKG  . ED EKG  . EKG 12-Lead  . EKG 12-Lead  Management plans discussed with the patient, family and they are in agreement.  CODE STATUS:     Code Status Orders        Start     Ordered   09/08/16 0342  Full code  Continuous     09/08/16 0341    Code Status History    Date Active Date Inactive Code Status Order ID Comments User Context   06/14/2014  9:55 PM 06/15/2014  5:31 PM Full Code [id number]  Lytle Butte, MD ED   05/26/2014  7:12 PM 06/02/2014  7:41 PM Full Code [id number]  Bettey Costa, MD Inpatient      TOTAL TIME TAKING CARE OF THIS PAT40 minutes.    Theodoro Grist M.D on 09/09/2016 at 2:22 PM  Between 7am to 6pm - Pager - [phone]  After 6pm go to [email or web address] - password [email or web address] [facility]  Office  [phone]  CC: Primary care physician; Cletis Athens, MD

## 2017-05-17 ENCOUNTER — Other Ambulatory Visit: Payer: Self-pay | Admitting: Internal Medicine

## 2017-05-17 DIAGNOSIS — R519 Headache, unspecified: Secondary | ICD-10-CM

## 2017-05-17 DIAGNOSIS — R51 Headache: Principal | ICD-10-CM

## 2017-05-19 ENCOUNTER — Ambulatory Visit: Payer: 59

## 2017-05-23 ENCOUNTER — Ambulatory Visit
Admission: RE | Admit: 2017-05-23 | Discharge: 2017-05-23 | Disposition: A | Discharge status: Home / Self Care | Payer: 59 | Source: Ambulatory Visit | Attending: Internal Medicine | Admitting: Internal Medicine

## 2017-05-23 DIAGNOSIS — R51 Headache: Secondary | ICD-10-CM | POA: Insufficient documentation

## 2017-05-23 DIAGNOSIS — R519 Headache, unspecified: Secondary | ICD-10-CM

## 2017-08-12 ENCOUNTER — Encounter: Payer: Self-pay | Admitting: Emergency Medicine

## 2017-08-12 ENCOUNTER — Emergency Department
Admission: EM | Admit: 2017-08-12 | Discharge: 2017-08-13 | Disposition: A | Discharge status: Home / Self Care | Payer: 59 | Attending: Emergency Medicine | Admitting: Emergency Medicine

## 2017-08-12 DIAGNOSIS — N390 Urinary tract infection, site not specified: Secondary | ICD-10-CM | POA: Insufficient documentation

## 2017-08-12 DIAGNOSIS — K529 Noninfective gastroenteritis and colitis, unspecified: Secondary | ICD-10-CM | POA: Diagnosis not present

## 2017-08-12 DIAGNOSIS — R55 Syncope and collapse: Secondary | ICD-10-CM | POA: Insufficient documentation

## 2017-08-12 DIAGNOSIS — F17210 Nicotine dependence, cigarettes, uncomplicated: Secondary | ICD-10-CM | POA: Diagnosis not present

## 2017-08-12 DIAGNOSIS — Z79899 Other long term (current) drug therapy: Secondary | ICD-10-CM | POA: Diagnosis not present

## 2017-08-12 DIAGNOSIS — J449 Chronic obstructive pulmonary disease, unspecified: Secondary | ICD-10-CM | POA: Insufficient documentation

## 2017-08-12 DIAGNOSIS — R112 Nausea with vomiting, unspecified: Secondary | ICD-10-CM

## 2017-08-12 LAB — CBC
HCT: 36.1 % (ref 35.0–47.0)
Hemoglobin: 12.5 g/dL (ref 12.0–16.0)
MCH: 34.7 pg — ABNORMAL HIGH (ref 26.0–34.0)
MCHC: 34.6 g/dL (ref 32.0–36.0)
MCV: 100.3 fL — ABNORMAL HIGH (ref 80.0–100.0)
Platelets: 282 10*3/uL (ref 150–440)
RBC: 3.6 MIL/uL — ABNORMAL LOW (ref 3.80–5.20)
RDW: 12.9 % (ref 11.5–14.5)
WBC: 12.2 10*3/uL — ABNORMAL HIGH (ref 3.6–11.0)

## 2017-08-12 LAB — URINALYSIS, COMPLETE (UACMP) WITH MICROSCOPIC
Bilirubin Urine: NEGATIVE
Glucose, UA: NEGATIVE mg/dL
Hgb urine dipstick: NEGATIVE
Ketones, ur: NEGATIVE mg/dL
Nitrite: NEGATIVE
Protein, ur: NEGATIVE mg/dL
Specific Gravity, Urine: 1.02 (ref 1.005–1.030)
pH: 5 (ref 5.0–8.0)

## 2017-08-12 LAB — BASIC METABOLIC PANEL
Anion gap: 7 (ref 5–15)
BUN: 20 mg/dL (ref 6–20)
CO2: 26 mmol/L (ref 22–32)
Calcium: 8.7 mg/dL — ABNORMAL LOW (ref 8.9–10.3)
Chloride: 109 mmol/L (ref 98–111)
Creatinine, Ser: 0.79 mg/dL (ref 0.44–1.00)
GFR calc Af Amer: >60 mL/min (ref >60)
GFR calc non Af Amer: >60 mL/min (ref >60)
Glucose, Bld: 115 mg/dL — ABNORMAL HIGH (ref 70–99)
Potassium: 3.9 mmol/L (ref 3.5–5.1)
Sodium: 142 mmol/L (ref 135–145)

## 2017-08-12 LAB — TROPONIN I: Troponin I: <0.03 ng/mL (ref <0.03)

## 2017-08-12 MED ORDER — ONDANSETRON 4 MG PO TBDP: 4.0000 mg | ORAL_TABLET | Freq: Three times a day (TID) | ORAL | 0 refills | Status: DC | PRN

## 2017-08-12 MED ORDER — ONDANSETRON HCL 4 MG/2ML IJ SOLN
4.0000 mg | Freq: Once | INTRAMUSCULAR | Status: AC
  Administered 2017-08-12: 4 mg via INTRAVENOUS
  Filled 2017-08-12: qty 2

## 2017-08-12 MED ORDER — SODIUM CHLORIDE 0.9 % IV BOLUS
1000.0000 mL | Freq: Once | INTRAVENOUS | Status: AC
  Administered 2017-08-12: 1000 mL via INTRAVENOUS

## 2017-08-12 NOTE — ED Provider Notes (Signed)
[facility] Emergency Department Provider Note  Time seen: 8:55 PM  I have reviewed the triage vital signs and the nursing notes.   HISTORY  Chief Complaint Near Syncope    HPI [name] [name] is a 62 y.o. female with a past medical history of COPD, abdominal pain, presents to the emergency department for nausea vomiting.  According to the patient for the past 1 week she has been nauseated with frequent episodes of vomiting as well as frequent episodes of diarrhea.  States earlier in the week she was experiencing some upper abdominal discomfort but states that has resolved but she continues to be nauseated with occasional vomiting and diarrhea.  Denies any fever at any point.  Denies any pain currently.  Denies any chest pain or shortness of breath.  Largely negative review of systems.   Past Medical History:  Diagnosis Date  . Bowel obstruction (HCC)    x1 month ago  . COPD (chronic obstructive pulmonary disease) [facility])     Patient Active Problem List   Diagnosis Date Noted  . E. coli gastroenteritis 09/09/2016  . Protein calorie malnutrition ([place]) 09/09/2016  . Tobacco abuse counseling 09/09/2016  . Hypokalemia 09/08/2016  . Ischemic bowel disease ([place]) 06/14/2014  . 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 COPD (chronic obstructive pulmonary disease) ([place]) 06/14/2014  . Protein-calorie malnutrition, severe ([place]) 05/27/2014  . Abdominal pain 05/26/2014    Past Surgical History:  Procedure Laterality Date  . COLON SURGERY      Prior to Admission medications   Medication Sig Start Date End Date Taking? Authorizing Provider  acetaminophen (TYLENOL) 160 MG/5ML solution Take 160 mg by mouth every 6 (six) hours as needed for mild pain.     [provider]  albuterol (PROVENTIL) (2.5 MG/3ML) 0.083% nebulizer solution Take 3 mLs (2.5 mg total) by nebulization 3 (three) times daily as needed for wheezing. 09/09/16   Theodoro Grist, MD  atorvastatin  (LIPITOR) 40 MG tablet Take 40 mg by mouth daily.    [provider]  budesonide-formoterol (SYMBICORT) 160-4.5 MCG/ACT inhaler Inhale 2 puffs into the lungs 2 (two) times daily.    [provider]  ciprofloxacin (CIPRO) 500 MG tablet Take 1 tablet (500 mg total) by mouth 2 (two) times daily. 09/09/16   Theodoro Grist, MD  cycloSPORINE (RESTASIS) 0.05 % ophthalmic emulsion Place 1 drop into both eyes every 12 (twelve) hours.    [provider]  feeding supplement, ENSURE ENLIVE, (ENSURE ENLIVE) LIQD Take 237 mLs by mouth 2 (two) times daily between meals. 09/10/16   Theodoro Grist, MD  fluticasone (FLONASE) 50 MCG/ACT nasal spray Place 2 sprays into both nostrils daily as needed for rhinitis.    [provider]  lisinopril (PRINIVIL,ZESTRIL) 40 MG tablet Take 40 mg by mouth daily.    [provider]  mirtazapine (REMERON) 15 MG tablet Take 15 mg by mouth daily.    [provider]  nicotine (NICODERM CQ - DOSED IN MG/24 HOURS) 14 mg/24hr patch Place 1 patch (14 mg total) onto the skin daily. 09/10/16   Theodoro Grist, MD  ondansetron (ZOFRAN) 4 MG tablet Take 1 tablet (4 mg total) by mouth every 6 (six) hours as needed for nausea. 05/29/14   Fritzi Mandes, MD  pantoprazole (PROTONIX) 40 MG tablet Take 1 tablet (40 mg total) by mouth daily. 09/09/16 09/09/17  Theodoro Grist, MD  pregabalin (LYRICA) 100 MG capsule Take 100 mg by mouth 3 (three) times daily.  [provider]  Vitamin D, Ergocalciferol, (DRISDOL) 50000 units CAPS capsule Take 50,000 Units by mouth every 7 (seven) days.    [provider]  Water For Irrigation, Sterile (FREE WATER) SOLN Place 60 mLs into feeding tube every 8 (eight) hours. Patient not taking: Reported on 09/08/2016 05/29/14   Fritzi Mandes, MD    Allergies  Allergen Reactions  . Aspirin Hives and Itching    Family History  Problem Relation Age of Onset  . Lupus Sister   . Heart failure Neg Hx   .  Diabetes Neg Hx     Social History Social History   Tobacco Use  . Smoking status: Current Every Day Smoker    Packs/day: 0.25    Types: Cigarettes  . Smokeless tobacco: Never Used  Substance Use Topics  . Alcohol use: No  . Drug use: No    Review of Systems Constitutional: Negative for fever. Cardiovascular: Negative for chest pain. Respiratory: Negative for shortness of breath. Gastrointestinal: Upper abdominal pain earlier this week, now resolved.  Positive for nausea vomiting and diarrhea. Genitourinary: Negative for urinary compaints Musculoskeletal: Negative for musculoskeletal complaints Skin: Negative for skin complaints  Neurological: Negative for headache All other ROS negative  ____________________________________________   PHYSICAL EXAM:  VITAL SIGNS: ED Triage Vitals  Enc Vitals Group     BP 08/12/17 1854 (!) 172/83     Pulse Rate 08/12/17 1854 82     Resp 08/12/17 1854 14     Temp 08/12/17 1854 98.3 F (36.8 C)     Temp Source 08/12/17 1854 Oral     SpO2 08/12/17 1854 98 %     Weight 08/12/17 1855 94 lb (42.6 kg)     Height 08/12/17 1855 5\' 5"  (1.651 m)     Head Circumference --      [place] --      Pain Score 08/12/17 1855 0     Pain Loc --      Pain Edu? --      Excl. in [place]? --    Constitutional: Alert and oriented. Well appearing and in no distress. Eyes: Normal exam ENT   Head: Normocephalic and atraumatic   Mouth/Throat: Mucous membranes are moist. Cardiovascular: Normal rate, regular rhythm. No murmur Respiratory: Normal respiratory effort without tachypnea nor retractions. Breath sounds are clear  Gastrointestinal: Soft and nontender. No distention.   Musculoskeletal: Nontender with normal range of motion in all extremities Neurologic:  Normal speech and language. No gross focal neurologic deficits  Skin:  Skin is warm, dry and intact.  Psychiatric: Mood and affect are normal.  ____________________________________________     EKG  EKG reviewed and interpreted by myself shows normal sinus rhythm 83 bpm with a narrow QRS, normal axis, normal intervals, largely reassuring EKG.  No ST changes.  ____________________________________________    INITIAL IMPRESSION / ASSESSMENT AND PLAN / ED COURSE  Pertinent labs & imaging results that were available during my care of the patient were reviewed by me and considered in my medical decision making (see chart for details).  Patient presents the emergency department with 1 week of nausea intermittent vomiting and diarrhea.  Denies any abdominal pain, completely benign abdominal exam.  Differential would include gastroenteritis, colitis or diverticulitis, gastritis.  Overall the patient appears well with a nontender exam and reassuring labs.  We will IV hydrate and dose oral Zofran.  Patient agreeable to this plan of care, urinalysis pending.  ____________________________________________   FINAL CLINICAL IMPRESSION(S) / ED  DIAGNOSES  Gastroenteritis    Harvest Dark, MD 08/12/17 2306

## 2017-08-12 NOTE — ED Notes (Signed)
Family at bedside. 

## 2017-08-12 NOTE — Discharge Instructions (Addendum)
You may take Zofran as needed for nausea or vomiting.  Clear liquids for the next 12 hours, then bland diet x3 days, then slowly advance diet as tolerated.  Return to the ER for recurrent or worsening symptoms, persistent vomiting, difficulty breathing or other concerns.

## 2017-08-12 NOTE — ED Notes (Signed)
[name] [name], [phone], calling for pt's BF [name] [name]

## 2017-08-12 NOTE — ED Triage Notes (Signed)
PT arrives via ems for near syncope. PT states she "hasnt felt good for a week,"but today she was with a friend who called ems after pt had near syncopal episode. Pt denies any pain.

## 2017-08-13 DIAGNOSIS — K529 Noninfective gastroenteritis and colitis, unspecified: Secondary | ICD-10-CM | POA: Diagnosis not present

## 2017-08-13 MED ORDER — FOSFOMYCIN TROMETHAMINE 3 G PO PACK
3.0000 g | PACK | Freq: Once | ORAL | Status: AC
  Administered 2017-08-13: 3 g via ORAL
  Filled 2017-08-13: qty 3

## 2017-08-13 NOTE — ED Provider Notes (Signed)
-----------------------------------------   1:36 AM on 08/13/2017 -----------------------------------------  Trace leukocytes noted.  Patient given fosfomycin.  Discharge per previous providers plan.   Paulette Blanch, MD 08/13/17 [phone]

## 2017-08-13 NOTE — ED Notes (Signed)
Peripheral IV discontinued. Catheter intact. No signs of infiltration or redness. Gauze applied to IV site.   Discharge instructions reviewed with patient. Questions fielded by this RN. Patient verbalizes understanding of instructions. Patient discharged home in stable condition per [facility]. No acute distress noted at time of discharge.

## 2017-08-28 DIAGNOSIS — I10 Essential (primary) hypertension: Secondary | ICD-10-CM | POA: Diagnosis not present

## 2017-09-04 DIAGNOSIS — I10 Essential (primary) hypertension: Secondary | ICD-10-CM | POA: Diagnosis not present

## 2017-09-11 DIAGNOSIS — I10 Essential (primary) hypertension: Secondary | ICD-10-CM | POA: Diagnosis not present

## 2017-09-18 DIAGNOSIS — I10 Essential (primary) hypertension: Secondary | ICD-10-CM | POA: Diagnosis not present

## 2017-09-19 DIAGNOSIS — R270 Ataxia, unspecified: Secondary | ICD-10-CM | POA: Diagnosis not present

## 2017-09-19 DIAGNOSIS — C3490 Malignant neoplasm of unspecified part of unspecified bronchus or lung: Secondary | ICD-10-CM | POA: Diagnosis not present

## 2017-09-19 DIAGNOSIS — Z886 Allergy status to analgesic agent status: Secondary | ICD-10-CM | POA: Diagnosis not present

## 2017-09-19 DIAGNOSIS — Z862 Personal history of diseases of the blood and blood-forming organs and certain disorders involving the immune mechanism: Secondary | ICD-10-CM | POA: Diagnosis not present

## 2017-09-24 DIAGNOSIS — I10 Essential (primary) hypertension: Secondary | ICD-10-CM | POA: Diagnosis not present

## 2017-09-25 DIAGNOSIS — I10 Essential (primary) hypertension: Secondary | ICD-10-CM | POA: Diagnosis not present

## 2017-09-28 DIAGNOSIS — C3411 Malignant neoplasm of upper lobe, right bronchus or lung: Secondary | ICD-10-CM | POA: Diagnosis not present

## 2017-09-28 DIAGNOSIS — I6529 Occlusion and stenosis of unspecified carotid artery: Secondary | ICD-10-CM | POA: Diagnosis not present

## 2017-09-28 DIAGNOSIS — J449 Chronic obstructive pulmonary disease, unspecified: Secondary | ICD-10-CM | POA: Diagnosis not present

## 2017-09-28 DIAGNOSIS — J984 Other disorders of lung: Secondary | ICD-10-CM | POA: Diagnosis not present

## 2017-09-28 DIAGNOSIS — K551 Chronic vascular disorders of intestine: Secondary | ICD-10-CM | POA: Diagnosis not present

## 2017-09-28 DIAGNOSIS — M797 Fibromyalgia: Secondary | ICD-10-CM | POA: Diagnosis not present

## 2017-09-28 DIAGNOSIS — R911 Solitary pulmonary nodule: Secondary | ICD-10-CM | POA: Diagnosis not present

## 2017-09-28 DIAGNOSIS — H35033 Hypertensive retinopathy, bilateral: Secondary | ICD-10-CM | POA: Diagnosis not present

## 2017-09-28 DIAGNOSIS — G8929 Other chronic pain: Secondary | ICD-10-CM | POA: Diagnosis not present

## 2017-10-09 DIAGNOSIS — I10 Essential (primary) hypertension: Secondary | ICD-10-CM | POA: Diagnosis not present

## 2017-10-16 DIAGNOSIS — I10 Essential (primary) hypertension: Secondary | ICD-10-CM | POA: Diagnosis not present

## 2017-10-23 DIAGNOSIS — I10 Essential (primary) hypertension: Secondary | ICD-10-CM | POA: Diagnosis not present

## 2017-10-24 DIAGNOSIS — I10 Essential (primary) hypertension: Secondary | ICD-10-CM | POA: Diagnosis not present

## 2017-10-30 DIAGNOSIS — I10 Essential (primary) hypertension: Secondary | ICD-10-CM | POA: Diagnosis not present

## 2017-12-12 DIAGNOSIS — M545 Low back pain: Secondary | ICD-10-CM | POA: Diagnosis not present

## 2017-12-13 DIAGNOSIS — M545 Low back pain: Secondary | ICD-10-CM | POA: Diagnosis not present

## 2017-12-14 DIAGNOSIS — M545 Low back pain: Secondary | ICD-10-CM | POA: Diagnosis not present

## 2017-12-15 DIAGNOSIS — M545 Low back pain: Secondary | ICD-10-CM | POA: Diagnosis not present

## 2017-12-16 DIAGNOSIS — M545 Low back pain: Secondary | ICD-10-CM | POA: Diagnosis not present

## 2017-12-17 DIAGNOSIS — M545 Low back pain: Secondary | ICD-10-CM | POA: Diagnosis not present

## 2017-12-18 DIAGNOSIS — M545 Low back pain: Secondary | ICD-10-CM | POA: Diagnosis not present

## 2017-12-19 DIAGNOSIS — M545 Low back pain: Secondary | ICD-10-CM | POA: Diagnosis not present

## 2017-12-20 DIAGNOSIS — M545 Low back pain: Secondary | ICD-10-CM | POA: Diagnosis not present

## 2017-12-21 DIAGNOSIS — M545 Low back pain: Secondary | ICD-10-CM | POA: Diagnosis not present

## 2017-12-22 DIAGNOSIS — M545 Low back pain: Secondary | ICD-10-CM | POA: Diagnosis not present

## 2017-12-23 DIAGNOSIS — M545 Low back pain: Secondary | ICD-10-CM | POA: Diagnosis not present

## 2017-12-24 DIAGNOSIS — M545 Low back pain: Secondary | ICD-10-CM | POA: Diagnosis not present

## 2018-01-01 DIAGNOSIS — M545 Low back pain: Secondary | ICD-10-CM | POA: Diagnosis not present

## 2018-01-02 DIAGNOSIS — M545 Low back pain: Secondary | ICD-10-CM | POA: Diagnosis not present

## 2018-01-03 DIAGNOSIS — M545 Low back pain: Secondary | ICD-10-CM | POA: Diagnosis not present

## 2018-01-04 DIAGNOSIS — M545 Low back pain: Secondary | ICD-10-CM | POA: Diagnosis not present

## 2018-01-05 DIAGNOSIS — M545 Low back pain: Secondary | ICD-10-CM | POA: Diagnosis not present

## 2018-01-06 DIAGNOSIS — M545 Low back pain: Secondary | ICD-10-CM | POA: Diagnosis not present

## 2018-01-07 DIAGNOSIS — M545 Low back pain: Secondary | ICD-10-CM | POA: Diagnosis not present

## 2018-01-08 DIAGNOSIS — M545 Low back pain: Secondary | ICD-10-CM | POA: Diagnosis not present

## 2018-01-09 DIAGNOSIS — M545 Low back pain: Secondary | ICD-10-CM | POA: Diagnosis not present

## 2018-01-10 DIAGNOSIS — G8929 Other chronic pain: Secondary | ICD-10-CM | POA: Diagnosis not present

## 2018-01-10 DIAGNOSIS — M545 Low back pain: Secondary | ICD-10-CM | POA: Diagnosis not present

## 2018-01-10 DIAGNOSIS — J449 Chronic obstructive pulmonary disease, unspecified: Secondary | ICD-10-CM | POA: Diagnosis not present

## 2018-01-10 DIAGNOSIS — R911 Solitary pulmonary nodule: Secondary | ICD-10-CM | POA: Diagnosis not present

## 2018-01-10 DIAGNOSIS — I6529 Occlusion and stenosis of unspecified carotid artery: Secondary | ICD-10-CM | POA: Diagnosis not present

## 2018-01-10 DIAGNOSIS — M797 Fibromyalgia: Secondary | ICD-10-CM | POA: Diagnosis not present

## 2018-01-10 DIAGNOSIS — C3411 Malignant neoplasm of upper lobe, right bronchus or lung: Secondary | ICD-10-CM | POA: Diagnosis not present

## 2018-01-10 DIAGNOSIS — J984 Other disorders of lung: Secondary | ICD-10-CM | POA: Diagnosis not present

## 2018-01-10 DIAGNOSIS — J439 Emphysema, unspecified: Secondary | ICD-10-CM | POA: Diagnosis not present

## 2018-01-10 DIAGNOSIS — K551 Chronic vascular disorders of intestine: Secondary | ICD-10-CM | POA: Diagnosis not present

## 2018-01-10 DIAGNOSIS — H35033 Hypertensive retinopathy, bilateral: Secondary | ICD-10-CM | POA: Diagnosis not present

## 2018-01-11 DIAGNOSIS — M545 Low back pain: Secondary | ICD-10-CM | POA: Diagnosis not present

## 2018-01-12 DIAGNOSIS — M545 Low back pain: Secondary | ICD-10-CM | POA: Diagnosis not present

## 2018-01-13 DIAGNOSIS — M545 Low back pain: Secondary | ICD-10-CM | POA: Diagnosis not present

## 2018-01-14 DIAGNOSIS — M545 Low back pain: Secondary | ICD-10-CM | POA: Diagnosis not present

## 2018-01-15 DIAGNOSIS — M545 Low back pain: Secondary | ICD-10-CM | POA: Diagnosis not present

## 2018-01-16 DIAGNOSIS — M545 Low back pain: Secondary | ICD-10-CM | POA: Diagnosis not present

## 2018-01-17 DIAGNOSIS — M545 Low back pain: Secondary | ICD-10-CM | POA: Diagnosis not present

## 2018-01-18 DIAGNOSIS — M545 Low back pain: Secondary | ICD-10-CM | POA: Diagnosis not present

## 2018-01-19 DIAGNOSIS — M545 Low back pain: Secondary | ICD-10-CM | POA: Diagnosis not present

## 2018-01-20 DIAGNOSIS — M545 Low back pain: Secondary | ICD-10-CM | POA: Diagnosis not present

## 2018-01-21 DIAGNOSIS — M545 Low back pain: Secondary | ICD-10-CM | POA: Diagnosis not present

## 2018-01-22 DIAGNOSIS — M545 Low back pain: Secondary | ICD-10-CM | POA: Diagnosis not present

## 2018-01-23 DIAGNOSIS — M545 Low back pain: Secondary | ICD-10-CM | POA: Diagnosis not present

## 2018-01-24 DIAGNOSIS — M545 Low back pain: Secondary | ICD-10-CM | POA: Diagnosis not present

## 2018-01-25 DIAGNOSIS — M545 Low back pain: Secondary | ICD-10-CM | POA: Diagnosis not present

## 2018-01-26 DIAGNOSIS — M545 Low back pain: Secondary | ICD-10-CM | POA: Diagnosis not present

## 2018-01-27 DIAGNOSIS — M545 Low back pain: Secondary | ICD-10-CM | POA: Diagnosis not present

## 2018-01-28 DIAGNOSIS — M545 Low back pain: Secondary | ICD-10-CM | POA: Diagnosis not present

## 2018-01-29 DIAGNOSIS — M545 Low back pain: Secondary | ICD-10-CM | POA: Diagnosis not present

## 2018-01-30 DIAGNOSIS — M545 Low back pain: Secondary | ICD-10-CM | POA: Diagnosis not present

## 2018-01-31 DIAGNOSIS — M545 Low back pain: Secondary | ICD-10-CM | POA: Diagnosis not present

## 2018-02-01 DIAGNOSIS — M545 Low back pain: Secondary | ICD-10-CM | POA: Diagnosis not present

## 2018-02-02 DIAGNOSIS — M545 Low back pain: Secondary | ICD-10-CM | POA: Diagnosis not present

## 2018-02-03 DIAGNOSIS — M545 Low back pain: Secondary | ICD-10-CM | POA: Diagnosis not present

## 2018-02-04 DIAGNOSIS — M545 Low back pain: Secondary | ICD-10-CM | POA: Diagnosis not present

## 2018-02-05 DIAGNOSIS — M545 Low back pain: Secondary | ICD-10-CM | POA: Diagnosis not present

## 2018-02-06 DIAGNOSIS — M545 Low back pain: Secondary | ICD-10-CM | POA: Diagnosis not present

## 2018-02-07 DIAGNOSIS — M545 Low back pain: Secondary | ICD-10-CM | POA: Diagnosis not present

## 2018-02-08 DIAGNOSIS — M545 Low back pain: Secondary | ICD-10-CM | POA: Diagnosis not present

## 2018-02-09 DIAGNOSIS — M545 Low back pain: Secondary | ICD-10-CM | POA: Diagnosis not present

## 2018-02-10 DIAGNOSIS — M545 Low back pain: Secondary | ICD-10-CM | POA: Diagnosis not present

## 2018-02-11 DIAGNOSIS — M545 Low back pain: Secondary | ICD-10-CM | POA: Diagnosis not present

## 2018-02-19 DIAGNOSIS — M545 Low back pain: Secondary | ICD-10-CM | POA: Diagnosis not present

## 2018-02-20 DIAGNOSIS — M545 Low back pain: Secondary | ICD-10-CM | POA: Diagnosis not present

## 2018-02-21 DIAGNOSIS — M545 Low back pain: Secondary | ICD-10-CM | POA: Diagnosis not present

## 2018-02-22 DIAGNOSIS — M545 Low back pain: Secondary | ICD-10-CM | POA: Diagnosis not present

## 2018-02-23 DIAGNOSIS — M545 Low back pain: Secondary | ICD-10-CM | POA: Diagnosis not present

## 2018-03-19 ENCOUNTER — Other Ambulatory Visit: Payer: Self-pay | Admitting: Cardiology

## 2018-03-19 ENCOUNTER — Ambulatory Visit
Admission: RE | Admit: 2018-03-19 | Discharge: 2018-03-19 | Disposition: A | Discharge status: Home / Self Care | Payer: 59 | Source: Ambulatory Visit | Attending: Internal Medicine | Admitting: Internal Medicine

## 2018-03-19 ENCOUNTER — Ambulatory Visit
Admission: RE | Admit: 2018-03-19 | Discharge: 2018-03-19 | Disposition: A | Discharge status: Home / Self Care | Payer: 59 | Source: Ambulatory Visit | Attending: Cardiology | Admitting: Cardiology

## 2018-03-19 DIAGNOSIS — M545 Low back pain, unspecified: Secondary | ICD-10-CM

## 2018-03-19 DIAGNOSIS — M25551 Pain in right hip: Secondary | ICD-10-CM

## 2018-07-14 ENCOUNTER — Emergency Department
Admission: EM | Admit: 2018-07-14 | Discharge: 2018-07-14 | Disposition: A | Discharge status: Home / Self Care | Payer: 59 | Attending: Emergency Medicine | Admitting: Emergency Medicine

## 2018-07-14 ENCOUNTER — Emergency Department: Payer: 59

## 2018-07-14 ENCOUNTER — Encounter: Payer: Self-pay | Admitting: Emergency Medicine

## 2018-07-14 DIAGNOSIS — J40 Bronchitis, not specified as acute or chronic: Secondary | ICD-10-CM | POA: Insufficient documentation

## 2018-07-14 DIAGNOSIS — J449 Chronic obstructive pulmonary disease, unspecified: Secondary | ICD-10-CM | POA: Insufficient documentation

## 2018-07-14 DIAGNOSIS — R079 Chest pain, unspecified: Secondary | ICD-10-CM

## 2018-07-14 DIAGNOSIS — R05 Cough: Secondary | ICD-10-CM | POA: Insufficient documentation

## 2018-07-14 DIAGNOSIS — Z85118 Personal history of other malignant neoplasm of bronchus and lung: Secondary | ICD-10-CM | POA: Insufficient documentation

## 2018-07-14 DIAGNOSIS — Z79899 Other long term (current) drug therapy: Secondary | ICD-10-CM | POA: Insufficient documentation

## 2018-07-14 DIAGNOSIS — F17210 Nicotine dependence, cigarettes, uncomplicated: Secondary | ICD-10-CM | POA: Insufficient documentation

## 2018-07-14 DIAGNOSIS — Z9221 Personal history of antineoplastic chemotherapy: Secondary | ICD-10-CM | POA: Insufficient documentation

## 2018-07-14 DIAGNOSIS — Z923 Personal history of irradiation: Secondary | ICD-10-CM | POA: Diagnosis not present

## 2018-07-14 LAB — BASIC METABOLIC PANEL
Anion gap: 8 (ref 5–15)
BUN: 13 mg/dL (ref 8–23)
CO2: 22 mmol/L (ref 22–32)
Calcium: 9.1 mg/dL (ref 8.9–10.3)
Chloride: 108 mmol/L (ref 98–111)
Creatinine, Ser: 0.64 mg/dL (ref 0.44–1.00)
GFR calc Af Amer: >60 mL/min (ref >60)
GFR calc non Af Amer: >60 mL/min (ref >60)
Glucose, Bld: 100 mg/dL — ABNORMAL HIGH (ref 70–99)
Potassium: 4.6 mmol/L (ref 3.5–5.1)
Sodium: 138 mmol/L (ref 135–145)

## 2018-07-14 LAB — CBC
HCT: 39 % (ref 36.0–46.0)
Hemoglobin: 12.9 g/dL (ref 12.0–15.0)
MCH: 33.3 pg (ref 26.0–34.0)
MCHC: 33.1 g/dL (ref 30.0–36.0)
MCV: 100.8 fL — ABNORMAL HIGH (ref 80.0–100.0)
Platelets: 295 10*3/uL (ref 150–400)
RBC: 3.87 MIL/uL (ref 3.87–5.11)
RDW: 12.8 % (ref 11.5–15.5)
WBC: 9.6 10*3/uL (ref 4.0–10.5)
nRBC: 0 % (ref 0.0–0.2)

## 2018-07-14 LAB — TROPONIN I: Troponin I: <0.03 ng/mL (ref <0.03)

## 2018-07-14 MED ORDER — GUAIFENESIN-CODEINE 100-10 MG/5ML PO SOLN: 5.0000 mL | Freq: Four times a day (QID) | ORAL | 0 refills | Status: DC | PRN

## 2018-07-14 MED ORDER — HYDROCODONE-ACETAMINOPHEN 5-325 MG PO TABS
2.0000 | ORAL_TABLET | Freq: Once | ORAL | Status: AC
  Administered 2018-07-14: 2 via ORAL
  Filled 2018-07-14: qty 2

## 2018-07-14 MED ORDER — AZITHROMYCIN 250 MG PO TABS: ORAL_TABLET | ORAL | 0 refills | Status: AC

## 2018-07-14 MED ORDER — SODIUM CHLORIDE 0.9% FLUSH: 3.0000 mL | Freq: Once | INTRAVENOUS | Status: DC

## 2018-07-14 NOTE — ED Provider Notes (Signed)
[facility] Emergency Department Provider Note  Time seen: 1:56 PM  I have reviewed the triage vital signs and the nursing notes.   HISTORY  Chief Complaint Chest Pain   HPI [name] [name] is a 63 y.o. female with a past medical history of COPD, lung cancer not currently on chemotherapy or radiation, presents to the emergency department for left chest pain cough and subjective fever at home.  According to the patient over the past 3 to 4 days she has had a mild cough, subjective fever at home where she feels hot and cold, as well as left chest pain.  States that chest pain is improved if she holds the left chest, worse with movement or coughing.  Has not measured a temperature at home.  States she went to [facility] 5 days ago and had a COVID test which was negative per record review.  Patient denies any increased shortness of breath at this time.  No abdominal pain.  Largely negative review of systems otherwise.  Past Medical History:  Diagnosis Date  . Bowel obstruction (HCC)    x1 month ago  . COPD (chronic obstructive pulmonary disease) [facility])     Patient Active Problem List   Diagnosis Date Noted  . E. coli gastroenteritis 09/09/2016  . Protein calorie malnutrition ([place]) 09/09/2016  . Tobacco abuse counseling 09/09/2016  . Hypokalemia 09/08/2016  . Ischemic bowel disease ([place]) 06/14/2014  . Intestinal angina ([place]) 06/14/2014  . COPD (chronic obstructive pulmonary disease) ([place]) 06/14/2014  . Protein-calorie malnutrition, severe ([place]) 05/27/2014  . Abdominal pain 05/26/2014    Past Surgical History:  Procedure Laterality Date  . COLON SURGERY      Prior to Admission medications   Medication Sig Start Date End Date Taking? Authorizing Provider  acetaminophen (TYLENOL) 160 MG/5ML solution Take 160 mg by mouth every 6 (six) hours as needed for mild pain.     [provider]  albuterol (PROVENTIL) (2.5 MG/3ML) 0.083% nebulizer solution Take 3 mLs  (2.5 mg total) by nebulization 3 (three) times daily as needed for wheezing. 09/09/16   Theodoro Grist, MD  atorvastatin (LIPITOR) 40 MG tablet Take 40 mg by mouth daily.    [provider]  budesonide-formoterol (SYMBICORT) 160-4.5 MCG/ACT inhaler Inhale 2 puffs into the lungs 2 (two) times daily.    [provider]  ciprofloxacin (CIPRO) 500 MG tablet Take 1 tablet (500 mg total) by mouth 2 (two) times daily. 09/09/16   Theodoro Grist, MD  cycloSPORINE (RESTASIS) 0.05 % ophthalmic emulsion Place 1 drop into both eyes every 12 (twelve) hours.    [provider]  feeding supplement, ENSURE ENLIVE, (ENSURE ENLIVE) LIQD Take 237 mLs by mouth 2 (two) times daily between meals. 09/10/16   Theodoro Grist, MD  fluticasone (FLONASE) 50 MCG/ACT nasal spray Place 2 sprays into both nostrils daily as needed for rhinitis.    [provider]  lisinopril (PRINIVIL,ZESTRIL) 40 MG tablet Take 40 mg by mouth daily.    [provider]  mirtazapine (REMERON) 15 MG tablet Take 15 mg by mouth daily.    [provider]  nicotine (NICODERM CQ - DOSED IN MG/24 HOURS) 14 mg/24hr patch Place 1 patch (14 mg total) onto the skin daily. 09/10/16   Theodoro Grist, MD  ondansetron (ZOFRAN ODT) 4 MG disintegrating tablet Take 1 tablet (4 mg total) by mouth every 8 (eight) hours as needed for nausea or vomiting. 08/12/17   Harvest Dark, MD  ondansetron [facility]) 4  MG tablet Take 1 tablet (4 mg total) by mouth every 6 (six) hours as needed for nausea. 05/29/14   Fritzi Mandes, MD  pantoprazole (PROTONIX) 40 MG tablet Take 1 tablet (40 mg total) by mouth daily. 09/09/16 09/09/17  Theodoro Grist, MD  pregabalin (LYRICA) 100 MG capsule Take 100 mg by mouth 3 (three) times daily.     [provider]  Vitamin D, Ergocalciferol, (DRISDOL) 50000 units CAPS capsule Take 50,000 Units by mouth every 7 (seven) days.    [provider]  Water For Irrigation, Sterile (FREE WATER)  SOLN Place 60 mLs into feeding tube every 8 (eight) hours. Patient not taking: Reported on 09/08/2016 05/29/14   Fritzi Mandes, MD    Allergies  Allergen Reactions  . Aspirin Hives and Itching    Family History  Problem Relation Age of Onset  . Lupus Sister   . Heart failure Neg Hx   . Diabetes Neg Hx     Social History Social History   Tobacco Use  . Smoking status: Current Every Day Smoker    Packs/day: 0.25    Types: Cigarettes  . Smokeless tobacco: Never Used  Substance Use Topics  . Alcohol use: No  . Drug use: No    Review of Systems Constitutional: Subjective fever at home. ENT: Negative for recent illness/congestion Cardiovascular: Left chest pain, worse with cough or movement. Respiratory: Negative for shortness of breath.  Positive for cough.   Gastrointestinal: Negative for abdominal pain, vomiting and diarrhea. Musculoskeletal: Negative for musculoskeletal complaints Neurological: Negative for headache All other ROS negative  ____________________________________________   PHYSICAL EXAM:  VITAL SIGNS: ED Triage Vitals  Enc Vitals Group     BP 07/14/18 1031 (!) 158/61     Pulse Rate 07/14/18 1031 68     Resp 07/14/18 1031 16     Temp 07/14/18 1031 99.6 F (37.6 C)     Temp Source 07/14/18 1031 Oral     SpO2 07/14/18 1031 98 %     Weight 07/14/18 1028 104 lb (47.2 kg)     Height 07/14/18 1028 5\' 5"  (1.651 m)     Head Circumference --      Peak Flow --      Pain Score 07/14/18 1028 10     Pain Loc --      Pain Edu? --      Excl. in [place]? --    Constitutional: Alert and oriented. Well appearing and in no distress. Eyes: Normal exam ENT      Head: Normocephalic and atraumatic.      Mouth/Throat: Mucous membranes are moist. Cardiovascular: Normal rate, regular rhythm. No murmur Respiratory: Normal respiratory effort without tachypnea nor retractions. Breath sounds are clear Gastrointestinal: Soft and nontender. No distention.  Musculoskeletal:  Moderate left chest wall tenderness to palpation. Neurologic:  Normal speech and language. No gross focal neurologic deficits  Skin:  Skin is warm, dry and intact.  Psychiatric: Mood and affect are normal.  ____________________________________________    EKG  EKG viewed and interpreted by myself shows a normal sinus rhythm at 80 bpm with a narrow QRS, normal axis, normal intervals, no concerning ST changes.  ____________________________________________    RADIOLOGY  Chest x-ray shows right upper lobe opacity mass.  No other abnormalities noted.  ____________________________________________   INITIAL IMPRESSION / ASSESSMENT AND PLAN / ED COURSE  Pertinent labs & imaging results that were available during my care of the patient were reviewed by me and considered in my  medical decision making (see chart for details).   Patient presents to the emergency department for left-sided chest pain that started approximately 3 to 4 days ago.  Patient was seen at her primary care doctor 5 days ago had a COVID test which was negative.  Differential at this time would include musculoskeletal pain, pneumonia, bronchitis.  Patient's work-up is overall reassuring, labs large within normal limits.  Patient does have a borderline low temperature in the emergency department 99.6, recent COVID test was negative.  Chest x-ray does not appear to show any pneumonia.  Given the patient's low-grade temperature and reported cough we will place on Zithromax for acute bronchitis.  We will cover with a short course of pain/cough medication as well to help with the patient's discomfort.  Patient agreeable to plan of care and will follow-up with her doctor.  [name] [name] was evaluated in Emergency Department on 07/14/2018 for the symptoms described in the history of present illness. She was evaluated in the context of the global COVID-19 pandemic, which necessitated consideration that the patient might be at risk for  infection with the SARS-CoV-2 virus that causes COVID-19. Institutional protocols and algorithms that pertain to the evaluation of patients at risk for COVID-19 are in a state of rapid change based on information released by regulatory bodies including the CDC and federal and state organizations. These policies and algorithms were followed during the patient's care in the ED.  ____________________________________________   FINAL CLINICAL IMPRESSION(S) / ED DIAGNOSES  Orchitis Chest wall pain   Harvest Dark, MD 07/14/18 1442

## 2018-07-14 NOTE — ED Notes (Signed)
First Nurse Note: Pt to ED via [facility] from home for left sided chest pain that is non-radiating and is worse with movement. EMS reports pts 12 lead was WNL. Pt is currently in NAD.

## 2018-07-14 NOTE — ED Triage Notes (Signed)
Pt to ED via [facility] from home for chest pain. Pt reports that she has had chest pain x 3 days, started getting worse last night. Pt states that the pain is under her right breast and does not radiate. Pt states that she is also having some shortness of breath. Pt denies any other symptoms. Pt is in NAD.

## 2018-10-22 ENCOUNTER — Emergency Department
Admission: EM | Admit: 2018-10-22 | Discharge: 2018-10-22 | Disposition: A | Discharge status: Home / Self Care | Payer: 59 | Attending: Emergency Medicine | Admitting: Emergency Medicine

## 2018-10-22 ENCOUNTER — Emergency Department: Payer: 59

## 2018-10-22 ENCOUNTER — Other Ambulatory Visit: Payer: Self-pay

## 2018-10-22 DIAGNOSIS — F10220 Alcohol dependence with intoxication, uncomplicated: Secondary | ICD-10-CM | POA: Insufficient documentation

## 2018-10-22 DIAGNOSIS — J449 Chronic obstructive pulmonary disease, unspecified: Secondary | ICD-10-CM | POA: Insufficient documentation

## 2018-10-22 DIAGNOSIS — R51 Headache: Secondary | ICD-10-CM | POA: Diagnosis not present

## 2018-10-22 DIAGNOSIS — Z79899 Other long term (current) drug therapy: Secondary | ICD-10-CM | POA: Insufficient documentation

## 2018-10-22 DIAGNOSIS — F10920 Alcohol use, unspecified with intoxication, uncomplicated: Secondary | ICD-10-CM

## 2018-10-22 DIAGNOSIS — M25551 Pain in right hip: Secondary | ICD-10-CM | POA: Diagnosis not present

## 2018-10-22 DIAGNOSIS — F17210 Nicotine dependence, cigarettes, uncomplicated: Secondary | ICD-10-CM | POA: Insufficient documentation

## 2018-10-22 DIAGNOSIS — W19XXXA Unspecified fall, initial encounter: Secondary | ICD-10-CM
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
## 2018-10-22 NOTE — ED Notes (Signed)
Patient currently sleeping in bed.
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
## 2018-10-22 NOTE — ED Notes (Signed)
ED Provider at bedside. 

## 2019-04-21 ENCOUNTER — Ambulatory Visit: Payer: 59 | Attending: Internal Medicine

## 2019-04-21 ENCOUNTER — Other Ambulatory Visit: Payer: Self-pay

## 2019-04-21 DIAGNOSIS — Z23 Encounter for immunization: Secondary | ICD-10-CM

## 2019-04-21 NOTE — Progress Notes (Signed)
   Covid-19 Vaccination Clinic  Name:  [name] [name]    MRN: [id number] DOB: [birth year]  04/21/2019  [name] [name] was observed post Covid-19 immunization for 15 minutes without incident. She was provided with Vaccine Information Sheet and instruction to access the V-Safe system.   [name] [name] was instructed to call 911 with any severe reactions post vaccine: Marland Kitchen Difficulty breathing  . Swelling of face and throat  . A fast heartbeat  . A bad rash all over body  . Dizziness and weakness   Immunizations Administered    Name Date Dose VIS Date Route   Pfizer COVID-19 Vaccine 04/21/2019  9:45 AM 0.3 mL 01/04/2019 Intramuscular   Manufacturer: [employer]   Lot: [id number]   [place]: [id number]

## 2019-04-25 DIAGNOSIS — J42 Unspecified chronic bronchitis: Secondary | ICD-10-CM | POA: Diagnosis not present

## 2019-04-25 DIAGNOSIS — R531 Weakness: Secondary | ICD-10-CM | POA: Diagnosis not present

## 2019-05-03 DIAGNOSIS — Z01419 Encounter for gynecological examination (general) (routine) without abnormal findings: Secondary | ICD-10-CM | POA: Diagnosis not present

## 2019-05-03 DIAGNOSIS — Z1239 Encounter for other screening for malignant neoplasm of breast: Secondary | ICD-10-CM | POA: Diagnosis not present

## 2019-05-03 DIAGNOSIS — Z124 Encounter for screening for malignant neoplasm of cervix: Secondary | ICD-10-CM | POA: Diagnosis not present

## 2019-05-03 DIAGNOSIS — E7849 Other hyperlipidemia: Secondary | ICD-10-CM | POA: Diagnosis not present

## 2019-05-06 ENCOUNTER — Other Ambulatory Visit: Payer: Self-pay | Admitting: Internal Medicine

## 2019-05-06 DIAGNOSIS — Z1231 Encounter for screening mammogram for malignant neoplasm of breast: Secondary | ICD-10-CM

## 2019-05-09 DIAGNOSIS — R079 Chest pain, unspecified: Secondary | ICD-10-CM | POA: Diagnosis not present

## 2019-05-09 DIAGNOSIS — R6889 Other general symptoms and signs: Secondary | ICD-10-CM | POA: Diagnosis not present

## 2019-05-09 DIAGNOSIS — I10 Essential (primary) hypertension: Secondary | ICD-10-CM | POA: Diagnosis not present

## 2019-05-09 DIAGNOSIS — R197 Diarrhea, unspecified: Secondary | ICD-10-CM | POA: Diagnosis not present

## 2019-05-09 DIAGNOSIS — Z131 Encounter for screening for diabetes mellitus: Secondary | ICD-10-CM | POA: Diagnosis not present

## 2019-05-09 DIAGNOSIS — J42 Unspecified chronic bronchitis: Secondary | ICD-10-CM | POA: Diagnosis not present

## 2019-05-09 DIAGNOSIS — R911 Solitary pulmonary nodule: Secondary | ICD-10-CM | POA: Diagnosis not present

## 2019-05-09 DIAGNOSIS — R799 Abnormal finding of blood chemistry, unspecified: Secondary | ICD-10-CM | POA: Diagnosis not present

## 2019-05-11 DIAGNOSIS — R531 Weakness: Secondary | ICD-10-CM | POA: Diagnosis not present

## 2019-05-11 DIAGNOSIS — J42 Unspecified chronic bronchitis: Secondary | ICD-10-CM | POA: Diagnosis not present

## 2019-05-13 ENCOUNTER — Ambulatory Visit
Admission: RE | Admit: 2019-05-13 | Discharge: 2019-05-13 | Disposition: A | Discharge status: Home / Self Care | Payer: Medicare HMO | Source: Ambulatory Visit | Attending: Internal Medicine | Admitting: Internal Medicine

## 2019-05-13 DIAGNOSIS — Z1231 Encounter for screening mammogram for malignant neoplasm of breast: Secondary | ICD-10-CM | POA: Diagnosis not present

## 2019-05-13 DIAGNOSIS — R6889 Other general symptoms and signs: Secondary | ICD-10-CM | POA: Diagnosis not present

## 2019-05-13 HISTORY — DX: Malignant (primary) neoplasm, unspecified: C80.1

## 2019-05-13 HISTORY — DX: Personal history of irradiation: Z92.3

## 2019-05-14 ENCOUNTER — Ambulatory Visit: Payer: 59

## 2019-05-17 ENCOUNTER — Other Ambulatory Visit: Payer: Self-pay | Admitting: *Deleted

## 2019-05-17 ENCOUNTER — Inpatient Hospital Stay
Admission: RE | Admit: 2019-05-17 | Discharge: 2019-05-17 | Disposition: A | Discharge status: Home / Self Care | Payer: Self-pay | Source: Ambulatory Visit | Attending: *Deleted | Admitting: *Deleted

## 2019-05-17 DIAGNOSIS — Z1231 Encounter for screening mammogram for malignant neoplasm of breast: Secondary | ICD-10-CM

## 2019-05-24 DIAGNOSIS — F329 Major depressive disorder, single episode, unspecified: Secondary | ICD-10-CM | POA: Diagnosis not present

## 2019-05-24 DIAGNOSIS — J449 Chronic obstructive pulmonary disease, unspecified: Secondary | ICD-10-CM | POA: Diagnosis not present

## 2019-05-24 DIAGNOSIS — R918 Other nonspecific abnormal finding of lung field: Secondary | ICD-10-CM | POA: Diagnosis not present

## 2019-05-24 DIAGNOSIS — R6889 Other general symptoms and signs: Secondary | ICD-10-CM | POA: Diagnosis not present

## 2019-05-24 DIAGNOSIS — C3411 Malignant neoplasm of upper lobe, right bronchus or lung: Secondary | ICD-10-CM | POA: Diagnosis not present

## 2019-05-24 DIAGNOSIS — F1910 Other psychoactive substance abuse, uncomplicated: Secondary | ICD-10-CM | POA: Diagnosis not present

## 2019-05-24 DIAGNOSIS — Z923 Personal history of irradiation: Secondary | ICD-10-CM | POA: Diagnosis not present

## 2019-05-24 DIAGNOSIS — R911 Solitary pulmonary nodule: Secondary | ICD-10-CM | POA: Diagnosis not present

## 2019-05-24 DIAGNOSIS — G8929 Other chronic pain: Secondary | ICD-10-CM | POA: Diagnosis not present

## 2019-06-05 ENCOUNTER — Ambulatory Visit: Payer: Medicare HMO | Attending: Internal Medicine

## 2019-06-05 DIAGNOSIS — Z23 Encounter for immunization: Secondary | ICD-10-CM

## 2019-06-05 NOTE — Progress Notes (Signed)
   Covid-19 Vaccination Clinic  Name:  [name] [name]    MRN: [id number] DOB: Jan 06, 1956  06/05/2019  [name] was observed post Covid-19 immunization for 15 minutes without incident. She was provided with Vaccine Information Sheet and instruction to access the V-Safe system.   [name] was instructed to call 911 with any severe reactions post vaccine: Marland Kitchen Difficulty breathing  . Swelling of face and throat  . A fast heartbeat  . A bad rash all over body  . Dizziness and weakness   Immunizations Administered    Name Date Dose VIS Date Route   Pfizer COVID-19 Vaccine 06/05/2019  9:38 AM 0.3 mL 03/20/2018 Intramuscular   Manufacturer: [place]   Lot: [id number]   [place]: [id number]

## 2019-06-10 DIAGNOSIS — R531 Weakness: Secondary | ICD-10-CM | POA: Diagnosis not present

## 2019-06-10 DIAGNOSIS — J42 Unspecified chronic bronchitis: Secondary | ICD-10-CM | POA: Diagnosis not present

## 2019-06-22 ENCOUNTER — Other Ambulatory Visit: Payer: Self-pay | Admitting: Internal Medicine

## 2019-06-27 ENCOUNTER — Other Ambulatory Visit: Payer: Self-pay | Admitting: Internal Medicine

## 2019-07-02 ENCOUNTER — Encounter: Payer: Self-pay | Admitting: *Deleted

## 2019-07-02 ENCOUNTER — Emergency Department: Payer: Medicare HMO

## 2019-07-02 DIAGNOSIS — Z79899 Other long term (current) drug therapy: Secondary | ICD-10-CM | POA: Diagnosis not present

## 2019-07-02 DIAGNOSIS — R9431 Abnormal electrocardiogram [ECG] [EKG]: Secondary | ICD-10-CM | POA: Diagnosis not present

## 2019-07-02 DIAGNOSIS — J449 Chronic obstructive pulmonary disease, unspecified: Secondary | ICD-10-CM | POA: Diagnosis not present

## 2019-07-02 DIAGNOSIS — I10 Essential (primary) hypertension: Secondary | ICD-10-CM | POA: Diagnosis not present

## 2019-07-02 DIAGNOSIS — N134 Hydroureter: Secondary | ICD-10-CM | POA: Diagnosis not present

## 2019-07-02 DIAGNOSIS — F17210 Nicotine dependence, cigarettes, uncomplicated: Secondary | ICD-10-CM | POA: Insufficient documentation

## 2019-07-02 DIAGNOSIS — R52 Pain, unspecified: Secondary | ICD-10-CM | POA: Diagnosis not present

## 2019-07-02 DIAGNOSIS — R1084 Generalized abdominal pain: Secondary | ICD-10-CM | POA: Insufficient documentation

## 2019-07-02 DIAGNOSIS — R197 Diarrhea, unspecified: Secondary | ICD-10-CM | POA: Insufficient documentation

## 2019-07-02 LAB — CBC
HCT: 42.1 % (ref 36.0–46.0)
Hemoglobin: 13.8 g/dL (ref 12.0–15.0)
MCH: 33.5 pg (ref 26.0–34.0)
MCHC: 32.8 g/dL (ref 30.0–36.0)
MCV: 102.2 fL — ABNORMAL HIGH (ref 80.0–100.0)
Platelets: 311 10*3/uL (ref 150–400)
RBC: 4.12 MIL/uL (ref 3.87–5.11)
RDW: 12.4 % (ref 11.5–15.5)
WBC: 16 10*3/uL — ABNORMAL HIGH (ref 4.0–10.5)
nRBC: 0 % (ref 0.0–0.2)

## 2019-07-02 LAB — COMPREHENSIVE METABOLIC PANEL
ALT: 18 U/L (ref 0–44)
AST: 20 U/L (ref 15–41)
Albumin: 4.5 g/dL (ref 3.5–5.0)
Alkaline Phosphatase: 64 U/L (ref 38–126)
Anion gap: 9 (ref 5–15)
BUN: 13 mg/dL (ref 8–23)
CO2: 23 mmol/L (ref 22–32)
Calcium: 9 mg/dL (ref 8.9–10.3)
Chloride: 109 mmol/L (ref 98–111)
Creatinine, Ser: 0.65 mg/dL (ref 0.44–1.00)
GFR calc Af Amer: >60 mL/min (ref >60)
GFR calc non Af Amer: >60 mL/min (ref >60)
Glucose, Bld: 92 mg/dL (ref 70–99)
Potassium: 3.6 mmol/L (ref 3.5–5.1)
Sodium: 141 mmol/L (ref 135–145)
Total Bilirubin: 0.6 mg/dL (ref 0.3–1.2)
Total Protein: 7.6 g/dL (ref 6.5–8.1)

## 2019-07-02 LAB — LIPASE, BLOOD: Lipase: 26 U/L (ref 11–51)

## 2019-07-02 MED ORDER — IOHEXOL 300 MG/ML  SOLN
75.0000 mL | Freq: Once | INTRAMUSCULAR | Status: AC | PRN
  Administered 2019-07-02: 75 mL via INTRAVENOUS

## 2019-07-02 MED ORDER — SODIUM CHLORIDE 0.9% FLUSH
3.0000 mL | Freq: Once | INTRAVENOUS | Status: AC
  Administered 2019-07-02: 3 mL via INTRAVENOUS

## 2019-07-02 NOTE — ED Notes (Signed)
EMS IV was painful and would not flush.  Placed new IV and drew labs.

## 2019-07-02 NOTE — ED Triage Notes (Signed)
Pt arrives by ems from home due to abdominal pain which became worse today.  Pt has hx of abdominal surgeries (75% of her intestines have had to be removed due to cancer). Pt arrives crying due to pain.  Pt also reports nausea.  No fever today. EKG from ems was sent to EDP.

## 2019-07-02 NOTE — ED Notes (Addendum)
Diarrhea sample collected, will take to lab. Pt reports that she has been having diarrhea since 2pm (unable to quantify episodes)

## 2019-07-03 ENCOUNTER — Emergency Department
Admission: EM | Admit: 2019-07-03 | Discharge: 2019-07-03 | Disposition: A | Discharge status: Home / Self Care | Payer: Medicare HMO | Attending: Emergency Medicine | Admitting: Emergency Medicine

## 2019-07-03 DIAGNOSIS — R1084 Generalized abdominal pain: Secondary | ICD-10-CM | POA: Diagnosis not present

## 2019-07-03 DIAGNOSIS — R197 Diarrhea, unspecified: Secondary | ICD-10-CM

## 2019-07-03 LAB — C DIFFICILE QUICK SCREEN W PCR REFLEX
C Diff antigen: NEGATIVE
C Diff interpretation: NOT DETECTED
C Diff toxin: NEGATIVE

## 2019-07-03 MED ORDER — ONDANSETRON HCL 4 MG/2ML IJ SOLN
4.0000 mg | Freq: Once | INTRAMUSCULAR | Status: AC
  Administered 2019-07-03: 4 mg via INTRAVENOUS
  Filled 2019-07-03: qty 2

## 2019-07-03 MED ORDER — MORPHINE SULFATE (PF) 4 MG/ML IV SOLN
4.0000 mg | Freq: Once | INTRAVENOUS | Status: AC
  Administered 2019-07-03: 4 mg via INTRAVENOUS
  Filled 2019-07-03: qty 1

## 2019-07-03 MED ORDER — ONDANSETRON 4 MG PO TBDP: 4.0000 mg | ORAL_TABLET | Freq: Three times a day (TID) | ORAL | 0 refills | Status: DC | PRN

## 2019-07-03 MED ORDER — ACETAMINOPHEN 500 MG PO TABS
1000.0000 mg | ORAL_TABLET | Freq: Once | ORAL | Status: AC
  Administered 2019-07-03: 1000 mg via ORAL
  Filled 2019-07-03: qty 2

## 2019-07-03 NOTE — ED Provider Notes (Signed)
[facility] Emergency Department Provider Note  ____________________________________________  Time seen: Approximately 3:46 AM  I have reviewed the triage vital signs and the nursing notes.   HISTORY  Chief Complaint Abdominal Pain   HPI [name] [name] is a 64 y.o. female with a history of ischemic bowel disease status post resection, chronic abdominal pain, bowel obstruction, lung cancer, COPD, drug abuse who presents for evaluation of abdominal pain.  Patient reports a component of chronic pain every day however over the last week the pain has been getting progressively worse.  Today she started having diarrhea.  She reports more than 10 episodes of watery brown diarrhea.  No nausea or vomiting, no fever or chills, no chest pain or shortness of breath, no dysuria or hematuria.  No recent antibiotic use.  No history of C. difficile.  She is complaining of diffuse cramping severe abdominal pain.   No melena or hematochezia.  Past Medical History:  Diagnosis Date  . Bowel obstruction (HCC)    x1 month ago  . Cancer ([place])    stomach cancer per pt and currently lung ca  . COPD (chronic obstructive pulmonary disease) ([place])   . Personal history of radiation therapy 2019-2020   lung ca    Patient Active Problem List   Diagnosis Date Noted  . E. coli gastroenteritis 09/09/2016  . Protein calorie malnutrition ([place]) 09/09/2016  . Tobacco abuse counseling 09/09/2016  . Hypokalemia 09/08/2016  . Ischemic bowel disease ([place]) 06/14/2014  . Intestinal angina ([place]) 06/14/2014  . COPD (chronic obstructive pulmonary disease) ([place]) 06/14/2014  . Protein-calorie malnutrition, severe ([place]) 05/27/2014  . Abdominal pain 05/26/2014    Past Surgical History:  Procedure Laterality Date  . COLON SURGERY      Prior to Admission medications   Medication Sig Start Date End Date Taking? Authorizing Provider  acetaminophen (TYLENOL) 160 MG/5ML solution Take 160 mg by mouth  every 6 (six) hours as needed for mild pain.     [provider]  albuterol (PROVENTIL) (2.5 MG/3ML) 0.083% nebulizer solution Take 3 mLs (2.5 mg total) by nebulization 3 (three) times daily as needed for wheezing. 09/09/16   Theodoro Grist, MD  atorvastatin (LIPITOR) 40 MG tablet Take 40 mg by mouth daily.    [provider]  budesonide-formoterol (SYMBICORT) 160-4.5 MCG/ACT inhaler Inhale 2 puffs into the lungs 2 (two) times daily.    [provider]  cholestyramine (QUESTRAN) 4 GM/DOSE powder TAKE 1 SCOOPFUL(4 GRAMS) MIXED IN 4-8 OUNCES OF LIQUID AND DRINK, DAILY 06/27/19   Cletis Athens, MD  ciprofloxacin (CIPRO) 500 MG tablet Take 1 tablet (500 mg total) by mouth 2 (two) times daily. 09/09/16   Theodoro Grist, MD  cycloSPORINE (RESTASIS) 0.05 % ophthalmic emulsion Place 1 drop into both eyes every 12 (twelve) hours.    [provider]  feeding supplement, ENSURE ENLIVE, (ENSURE ENLIVE) LIQD Take 237 mLs by mouth 2 (two) times daily between meals. 09/10/16   Theodoro Grist, MD  fluticasone (FLONASE) 50 MCG/ACT nasal spray Place 2 sprays into both nostrils daily as needed for rhinitis.    [provider]  guaiFENesin-codeine 100-10 MG/5ML syrup Take 5 mLs by mouth every 6 (six) hours as needed for cough. 07/14/18   Harvest Dark, MD  lisinopril (PRINIVIL,ZESTRIL) 40 MG tablet Take 40 mg by mouth daily.    [provider]  mirtazapine (REMERON) 15 MG tablet Take 15 mg by mouth daily.    [provider]  nicotine (NICODERM  CQ - DOSED IN MG/24 HOURS) 14 mg/24hr patch Place 1 patch (14 mg total) onto the skin daily. 09/10/16   Theodoro Grist, MD  ondansetron (ZOFRAN ODT) 4 MG disintegrating tablet Take 1 tablet (4 mg total) by mouth every 8 (eight) hours as needed for nausea or vomiting. 08/12/17   Harvest Dark, MD  ondansetron (ZOFRAN ODT) 4 MG disintegrating tablet Take 1 tablet (4 mg total) by mouth every 8 (eight) hours as needed.  07/03/19   Rudene Re, MD  ondansetron (ZOFRAN) 4 MG tablet Take 1 tablet (4 mg total) by mouth every 6 (six) hours as needed for nausea. 05/29/14   Fritzi Mandes, MD  pantoprazole (PROTONIX) 40 MG tablet Take 1 tablet (40 mg total) by mouth daily. 09/09/16 09/09/17  Theodoro Grist, MD  pregabalin (LYRICA) 100 MG capsule Take 100 mg by mouth 3 (three) times daily.     [provider]  pregabalin (LYRICA) 50 MG capsule TAKE 1 CAPSULE BY MOUTH 3 TIMES DAILY 06/27/19   Cletis Athens, MD  Vitamin D, Ergocalciferol, (DRISDOL) 50000 units CAPS capsule Take 50,000 Units by mouth every 7 (seven) days.    [provider]  Water For Irrigation, Sterile (FREE WATER) SOLN Place 60 mLs into feeding tube every 8 (eight) hours. Patient not taking: Reported on 09/08/2016 05/29/14   Fritzi Mandes, MD    Allergies Aspirin  Family History  Problem Relation Age of Onset  . Lupus Sister   . Heart failure Neg Hx   . Diabetes Neg Hx     Social History Social History   Tobacco Use  . Smoking status: Current Every Day Smoker    Packs/day: 0.25    Types: Cigarettes  . Smokeless tobacco: Never Used  Substance Use Topics  . Alcohol use: No  . Drug use: No    Review of Systems  Constitutional: Negative for fever. Eyes: Negative for visual changes. ENT: Negative for sore throat. Neck: No neck pain  Cardiovascular: Negative for chest pain. Respiratory: Negative for shortness of breath. Gastrointestinal: + abdominal pain and diarrhea. No vomiting or diarrhea. Genitourinary: Negative for dysuria. Musculoskeletal: Negative for back pain. Skin: Negative for rash. Neurological: Negative for headaches, weakness or numbness. Psych: No SI or HI  ____________________________________________   PHYSICAL EXAM:  VITAL SIGNS: ED Triage Vitals  Enc Vitals Group     BP 07/02/19 2210 (!) 160/68     Pulse Rate 07/02/19 2210 92     Resp 07/02/19 2210 16     Temp 07/02/19 2210 99.8 F (37.7 C)      Temp Source 07/02/19 2210 Oral     SpO2 07/02/19 2210 98 %     Weight 07/02/19 2211 103 lb (46.7 kg)     Height --      Head Circumference --      Peak Flow --      Pain Score 07/02/19 2210 10     Pain Loc --      Pain Edu? --      Excl. in [place]? --     Constitutional: Alert and oriented, cachectic.  HEENT:      Head: Normocephalic and atraumatic.         Eyes: Conjunctivae are normal. Sclera is non-icteric.       Mouth/Throat: Mucous membranes are moist.       Neck: Supple with no signs of meningismus. Cardiovascular: Regular rate and rhythm. No murmurs, gallops, or rubs. Respiratory: Normal respiratory effort. Lungs are clear to  auscultation bilaterally.  Gastrointestinal: Soft, diffusely tender to palpation, and non distended with positive bowel sounds. No rebound or guarding. Musculoskeletal: No edema, cyanosis, or erythema of extremities. Neurologic: Normal speech and language. Face is symmetric. Moving all extremities. No gross focal neurologic deficits are appreciated. Skin: Skin is warm, dry and intact. No rash noted. Psychiatric: Mood and affect are normal. Speech and behavior are normal.  ____________________________________________   LABS (all labs ordered are listed, but only abnormal results are displayed)  Labs Reviewed  CBC - Abnormal; Notable for the following components:      Result Value   WBC 16.0 (*)    MCV 102.2 (*)    All other components within normal limits  C DIFFICILE QUICK SCREEN W PCR REFLEX  LIPASE, BLOOD  COMPREHENSIVE METABOLIC PANEL  URINALYSIS, COMPLETE (UACMP) WITH MICROSCOPIC   ____________________________________________  EKG  ED ECG REPORT I, Rudene Re, the attending physician, personally viewed and interpreted this ECG.  Normal sinus rhythm, rate of 91, normal intervals, LVH, no ST elevations or depressions, anterior Q waves.  Unchanged from prior. ____________________________________________  RADIOLOGY  I have  personally reviewed the images performed during this visit and I agree with the Radiologist's read.   Interpretation by Radiologist:  CT Abdomen Pelvis W Contrast  Result Date: 07/02/2019 CLINICAL DATA:  Abdominal pain.  History of small-bowel resection. EXAM: CT ABDOMEN AND PELVIS WITH CONTRAST TECHNIQUE: Multidetector CT imaging of the abdomen and pelvis was performed using the standard protocol following bolus administration of intravenous contrast. CONTRAST:  47mL OMNIPAQUE IOHEXOL 300 MG/ML  SOLN COMPARISON:  None. FINDINGS: LOWER CHEST: Normal. HEPATOBILIARY: Normal hepatic contours. No intra- or extrahepatic biliary dilatation. Gallbladder is not visualized and likely surgically absent PANCREAS: Normal pancreas. No ductal dilatation or peripancreatic fluid collection. SPLEEN: Normal. ADRENALS/URINARY TRACT: The adrenal glands are normal. No hydronephrosis, nephroureterolithiasis or solid renal mass. The urinary bladder is normal for degree of distention STOMACH/BOWEL: There is an anastomotic line in the left upper quadrant. A second anastomosis noted in the anterior mid abdomen. No dilated small bowel to suggest obstruction. No focal colonic abnormality. The appendix is not visualized. VASCULAR/LYMPHATIC: There is calcific atherosclerosis of the abdominal aorta. No abdominal or pelvic lymphadenopathy. REPRODUCTIVE: Normal uterus.  No adnexal mass. MUSCULOSKELETAL. No bony spinal canal stenosis or focal osseous abnormality. OTHER: None. IMPRESSION: 1. No acute abnormality of the abdomen or pelvis. 2. Postsurgical changes of the small bowel without evidence of obstruction. 3. Aortic Atherosclerosis (ICD10-I70.0). Electronically Signed   By: Ulyses Jarred M.D.   On: 07/02/2019 23:23     ____________________________________________   PROCEDURES  Procedure(s) performed:yes .1-3 Lead EKG Interpretation Performed by: Rudene Re, MD Authorized by: Rudene Re, MD     Interpretation:  normal     ECG rate assessment: normal     Rhythm: sinus rhythm     Ectopy: none     Critical Care performed:  None ____________________________________________   INITIAL IMPRESSION / ASSESSMENT AND PLAN / ED COURSE  64 y.o. female with a history of ischemic bowel disease status post resection, chronic abdominal pain, bowel obstruction, lung cancer, COPD, drug abuse who presents for evaluation of abdominal pain and diarrhea.  Patient is cachectic but in no obvious distress, normal vital signs, abdomen is soft with diffuse tenderness, no distention, positive bowel sounds, no localized tenderness, rebound or guarding.  Differential diagnosis including gastroenteritis versus colitis versus C. difficile versus diverticulitis.  CT scan visualized with no acute abnormalities, confirmed by radiology.  Labs showing  leukocytosis with white count of 16.  Will send stool for C. difficile culture.  No significant electrolyte derangements.  Will treat patient's symptoms with IV morphine and Zofran, IV fluids and reassess.  Old medical records reviewed.  Patient placed on telemetry for close monitoring.  _________________________ 5:59 AM on 07/03/2019 -----------------------------------------  Patient feels improved, tolerating p.o. C. difficile negative.  Will discharge home with increase oral hydration, Zofran for nausea, bland diet, and follow-up with PCP.  Discussed my standard return precautions.    _____________________________________________ Please note:  Patient was evaluated in Emergency Department today for the symptoms described in the history of present illness. Patient was evaluated in the context of the global COVID-19 pandemic, which necessitated consideration that the patient might be at risk for infection with the SARS-CoV-2 virus that causes COVID-19. Institutional protocols and algorithms that pertain to the evaluation of patients at risk for COVID-19 are in a state of rapid change  based on information released by regulatory bodies including the CDC and federal and state organizations. These policies and algorithms were followed during the patient's care in the ED.  Some ED evaluations and interventions may be delayed as a result of limited staffing during the pandemic.   [place] Controlled Substance Database was reviewed by me. ____________________________________________   FINAL CLINICAL IMPRESSION(S) / ED DIAGNOSES   Final diagnoses:  Generalized abdominal pain  Diarrhea of presumed infectious origin      NEW MEDICATIONS STARTED DURING THIS VISIT:  ED Discharge Orders         Ordered    ondansetron (ZOFRAN ODT) 4 MG disintegrating tablet  Every 8 hours PRN     07/03/19 0558           Note:  This document was prepared using Dragon voice recognition software and may include unintentional dictation errors.    Alfred Levins, [place], MD 07/03/19 [phone]

## 2019-07-03 NOTE — ED Notes (Signed)
Pt incont mod amount liquid brown stool; assisted to BR to clean self & change pants, attends; warm blankets given

## 2019-07-03 NOTE — Discharge Instructions (Addendum)

## 2019-07-10 DIAGNOSIS — R269 Unspecified abnormalities of gait and mobility: Secondary | ICD-10-CM | POA: Diagnosis not present

## 2019-07-11 DIAGNOSIS — R531 Weakness: Secondary | ICD-10-CM | POA: Diagnosis not present

## 2019-07-11 DIAGNOSIS — J42 Unspecified chronic bronchitis: Secondary | ICD-10-CM | POA: Diagnosis not present

## 2019-07-26 ENCOUNTER — Other Ambulatory Visit: Payer: Self-pay | Admitting: Internal Medicine

## 2019-07-31 DIAGNOSIS — R202 Paresthesia of skin: Secondary | ICD-10-CM | POA: Diagnosis not present

## 2019-07-31 DIAGNOSIS — R519 Headache, unspecified: Secondary | ICD-10-CM | POA: Diagnosis not present

## 2019-07-31 DIAGNOSIS — Z886 Allergy status to analgesic agent status: Secondary | ICD-10-CM | POA: Diagnosis not present

## 2019-07-31 DIAGNOSIS — J449 Chronic obstructive pulmonary disease, unspecified: Secondary | ICD-10-CM | POA: Diagnosis not present

## 2019-07-31 DIAGNOSIS — C3412 Malignant neoplasm of upper lobe, left bronchus or lung: Secondary | ICD-10-CM | POA: Diagnosis not present

## 2019-07-31 DIAGNOSIS — G8929 Other chronic pain: Secondary | ICD-10-CM | POA: Diagnosis not present

## 2019-07-31 DIAGNOSIS — I10 Essential (primary) hypertension: Secondary | ICD-10-CM | POA: Diagnosis not present

## 2019-07-31 DIAGNOSIS — Z79899 Other long term (current) drug therapy: Secondary | ICD-10-CM | POA: Diagnosis not present

## 2019-07-31 DIAGNOSIS — R200 Anesthesia of skin: Secondary | ICD-10-CM | POA: Diagnosis not present

## 2019-07-31 DIAGNOSIS — Z7951 Long term (current) use of inhaled steroids: Secondary | ICD-10-CM | POA: Diagnosis not present

## 2019-07-31 DIAGNOSIS — Z923 Personal history of irradiation: Secondary | ICD-10-CM | POA: Diagnosis not present

## 2019-07-31 DIAGNOSIS — R6889 Other general symptoms and signs: Secondary | ICD-10-CM | POA: Diagnosis not present

## 2019-07-31 DIAGNOSIS — C3490 Malignant neoplasm of unspecified part of unspecified bronchus or lung: Secondary | ICD-10-CM | POA: Diagnosis not present

## 2019-07-31 DIAGNOSIS — R531 Weakness: Secondary | ICD-10-CM | POA: Diagnosis not present

## 2019-08-08 DIAGNOSIS — F1910 Other psychoactive substance abuse, uncomplicated: Secondary | ICD-10-CM | POA: Diagnosis not present

## 2019-08-08 DIAGNOSIS — J449 Chronic obstructive pulmonary disease, unspecified: Secondary | ICD-10-CM | POA: Diagnosis not present

## 2019-08-08 DIAGNOSIS — Z923 Personal history of irradiation: Secondary | ICD-10-CM | POA: Diagnosis not present

## 2019-08-08 DIAGNOSIS — C3412 Malignant neoplasm of upper lobe, left bronchus or lung: Secondary | ICD-10-CM | POA: Diagnosis not present

## 2019-08-08 DIAGNOSIS — F329 Major depressive disorder, single episode, unspecified: Secondary | ICD-10-CM | POA: Diagnosis not present

## 2019-08-08 DIAGNOSIS — R918 Other nonspecific abnormal finding of lung field: Secondary | ICD-10-CM | POA: Diagnosis not present

## 2019-08-08 DIAGNOSIS — G8929 Other chronic pain: Secondary | ICD-10-CM | POA: Diagnosis not present

## 2019-08-08 DIAGNOSIS — I10 Essential (primary) hypertension: Secondary | ICD-10-CM | POA: Diagnosis not present

## 2019-08-08 DIAGNOSIS — R911 Solitary pulmonary nodule: Secondary | ICD-10-CM | POA: Diagnosis not present

## 2019-08-08 DIAGNOSIS — C3411 Malignant neoplasm of upper lobe, right bronchus or lung: Secondary | ICD-10-CM | POA: Diagnosis not present

## 2019-08-10 DIAGNOSIS — R531 Weakness: Secondary | ICD-10-CM | POA: Diagnosis not present

## 2019-08-10 DIAGNOSIS — J42 Unspecified chronic bronchitis: Secondary | ICD-10-CM | POA: Diagnosis not present

## 2019-08-14 DIAGNOSIS — R911 Solitary pulmonary nodule: Secondary | ICD-10-CM | POA: Diagnosis not present

## 2019-08-14 DIAGNOSIS — C3411 Malignant neoplasm of upper lobe, right bronchus or lung: Secondary | ICD-10-CM | POA: Diagnosis not present

## 2019-08-14 DIAGNOSIS — F1910 Other psychoactive substance abuse, uncomplicated: Secondary | ICD-10-CM | POA: Diagnosis not present

## 2019-08-14 DIAGNOSIS — R6889 Other general symptoms and signs: Secondary | ICD-10-CM | POA: Diagnosis not present

## 2019-08-14 DIAGNOSIS — Z923 Personal history of irradiation: Secondary | ICD-10-CM | POA: Diagnosis not present

## 2019-08-14 DIAGNOSIS — R918 Other nonspecific abnormal finding of lung field: Secondary | ICD-10-CM | POA: Diagnosis not present

## 2019-08-14 DIAGNOSIS — J449 Chronic obstructive pulmonary disease, unspecified: Secondary | ICD-10-CM | POA: Diagnosis not present

## 2019-08-14 DIAGNOSIS — G8929 Other chronic pain: Secondary | ICD-10-CM | POA: Diagnosis not present

## 2019-08-14 DIAGNOSIS — I10 Essential (primary) hypertension: Secondary | ICD-10-CM | POA: Diagnosis not present

## 2019-08-14 DIAGNOSIS — F329 Major depressive disorder, single episode, unspecified: Secondary | ICD-10-CM | POA: Diagnosis not present

## 2019-08-15 DIAGNOSIS — J449 Chronic obstructive pulmonary disease, unspecified: Secondary | ICD-10-CM | POA: Diagnosis not present

## 2019-08-15 DIAGNOSIS — F1910 Other psychoactive substance abuse, uncomplicated: Secondary | ICD-10-CM | POA: Diagnosis not present

## 2019-08-15 DIAGNOSIS — F329 Major depressive disorder, single episode, unspecified: Secondary | ICD-10-CM | POA: Diagnosis not present

## 2019-08-15 DIAGNOSIS — G8929 Other chronic pain: Secondary | ICD-10-CM | POA: Diagnosis not present

## 2019-08-15 DIAGNOSIS — Z923 Personal history of irradiation: Secondary | ICD-10-CM | POA: Diagnosis not present

## 2019-08-15 DIAGNOSIS — R6889 Other general symptoms and signs: Secondary | ICD-10-CM | POA: Diagnosis not present

## 2019-08-15 DIAGNOSIS — I10 Essential (primary) hypertension: Secondary | ICD-10-CM | POA: Diagnosis not present

## 2019-08-15 DIAGNOSIS — C3411 Malignant neoplasm of upper lobe, right bronchus or lung: Secondary | ICD-10-CM | POA: Diagnosis not present

## 2019-08-15 DIAGNOSIS — R911 Solitary pulmonary nodule: Secondary | ICD-10-CM | POA: Diagnosis not present

## 2019-08-15 DIAGNOSIS — R918 Other nonspecific abnormal finding of lung field: Secondary | ICD-10-CM | POA: Diagnosis not present

## 2019-08-19 DIAGNOSIS — R911 Solitary pulmonary nodule: Secondary | ICD-10-CM | POA: Diagnosis not present

## 2019-08-19 DIAGNOSIS — J449 Chronic obstructive pulmonary disease, unspecified: Secondary | ICD-10-CM | POA: Diagnosis not present

## 2019-08-19 DIAGNOSIS — C3411 Malignant neoplasm of upper lobe, right bronchus or lung: Secondary | ICD-10-CM | POA: Diagnosis not present

## 2019-08-19 DIAGNOSIS — R918 Other nonspecific abnormal finding of lung field: Secondary | ICD-10-CM | POA: Diagnosis not present

## 2019-08-19 DIAGNOSIS — F329 Major depressive disorder, single episode, unspecified: Secondary | ICD-10-CM | POA: Diagnosis not present

## 2019-08-19 DIAGNOSIS — G8929 Other chronic pain: Secondary | ICD-10-CM | POA: Diagnosis not present

## 2019-08-19 DIAGNOSIS — R6889 Other general symptoms and signs: Secondary | ICD-10-CM | POA: Diagnosis not present

## 2019-08-19 DIAGNOSIS — F1910 Other psychoactive substance abuse, uncomplicated: Secondary | ICD-10-CM | POA: Diagnosis not present

## 2019-08-19 DIAGNOSIS — Z923 Personal history of irradiation: Secondary | ICD-10-CM | POA: Diagnosis not present

## 2019-08-19 DIAGNOSIS — I10 Essential (primary) hypertension: Secondary | ICD-10-CM | POA: Diagnosis not present

## 2019-08-20 DIAGNOSIS — Z923 Personal history of irradiation: Secondary | ICD-10-CM | POA: Diagnosis not present

## 2019-08-20 DIAGNOSIS — R6889 Other general symptoms and signs: Secondary | ICD-10-CM | POA: Diagnosis not present

## 2019-08-20 DIAGNOSIS — J449 Chronic obstructive pulmonary disease, unspecified: Secondary | ICD-10-CM | POA: Diagnosis not present

## 2019-08-20 DIAGNOSIS — R918 Other nonspecific abnormal finding of lung field: Secondary | ICD-10-CM | POA: Diagnosis not present

## 2019-08-20 DIAGNOSIS — I10 Essential (primary) hypertension: Secondary | ICD-10-CM | POA: Diagnosis not present

## 2019-08-20 DIAGNOSIS — F329 Major depressive disorder, single episode, unspecified: Secondary | ICD-10-CM | POA: Diagnosis not present

## 2019-08-20 DIAGNOSIS — C3411 Malignant neoplasm of upper lobe, right bronchus or lung: Secondary | ICD-10-CM | POA: Diagnosis not present

## 2019-08-20 DIAGNOSIS — G8929 Other chronic pain: Secondary | ICD-10-CM | POA: Diagnosis not present

## 2019-08-20 DIAGNOSIS — F1910 Other psychoactive substance abuse, uncomplicated: Secondary | ICD-10-CM | POA: Diagnosis not present

## 2019-08-20 DIAGNOSIS — R911 Solitary pulmonary nodule: Secondary | ICD-10-CM | POA: Diagnosis not present

## 2019-08-21 DIAGNOSIS — C3412 Malignant neoplasm of upper lobe, left bronchus or lung: Secondary | ICD-10-CM | POA: Diagnosis not present

## 2019-08-21 DIAGNOSIS — Z923 Personal history of irradiation: Secondary | ICD-10-CM | POA: Diagnosis not present

## 2019-08-21 DIAGNOSIS — R918 Other nonspecific abnormal finding of lung field: Secondary | ICD-10-CM | POA: Diagnosis not present

## 2019-08-21 DIAGNOSIS — J449 Chronic obstructive pulmonary disease, unspecified: Secondary | ICD-10-CM | POA: Diagnosis not present

## 2019-08-21 DIAGNOSIS — R911 Solitary pulmonary nodule: Secondary | ICD-10-CM | POA: Diagnosis not present

## 2019-08-21 DIAGNOSIS — R6889 Other general symptoms and signs: Secondary | ICD-10-CM | POA: Diagnosis not present

## 2019-08-21 DIAGNOSIS — I10 Essential (primary) hypertension: Secondary | ICD-10-CM | POA: Diagnosis not present

## 2019-08-21 DIAGNOSIS — G8929 Other chronic pain: Secondary | ICD-10-CM | POA: Diagnosis not present

## 2019-08-21 DIAGNOSIS — C3411 Malignant neoplasm of upper lobe, right bronchus or lung: Secondary | ICD-10-CM | POA: Diagnosis not present

## 2019-08-21 DIAGNOSIS — F1910 Other psychoactive substance abuse, uncomplicated: Secondary | ICD-10-CM | POA: Diagnosis not present

## 2019-08-21 DIAGNOSIS — F329 Major depressive disorder, single episode, unspecified: Secondary | ICD-10-CM | POA: Diagnosis not present

## 2019-08-30 ENCOUNTER — Encounter: Payer: Self-pay | Admitting: Emergency Medicine

## 2019-08-30 ENCOUNTER — Emergency Department: Payer: Medicare HMO

## 2019-08-30 ENCOUNTER — Emergency Department
Admission: EM | Admit: 2019-08-30 | Discharge: 2019-08-30 | Disposition: A | Discharge status: Home / Self Care | Payer: Medicare HMO | Attending: Emergency Medicine | Admitting: Emergency Medicine

## 2019-08-30 ENCOUNTER — Other Ambulatory Visit: Payer: Self-pay

## 2019-08-30 DIAGNOSIS — C169 Malignant neoplasm of stomach, unspecified: Secondary | ICD-10-CM | POA: Insufficient documentation

## 2019-08-30 DIAGNOSIS — X500XXA Overexertion from strenuous movement or load, initial encounter: Secondary | ICD-10-CM | POA: Insufficient documentation

## 2019-08-30 DIAGNOSIS — Y9289 Other specified places as the place of occurrence of the external cause: Secondary | ICD-10-CM | POA: Diagnosis not present

## 2019-08-30 DIAGNOSIS — S39012A Strain of muscle, fascia and tendon of lower back, initial encounter: Secondary | ICD-10-CM | POA: Diagnosis not present

## 2019-08-30 DIAGNOSIS — F17210 Nicotine dependence, cigarettes, uncomplicated: Secondary | ICD-10-CM | POA: Diagnosis not present

## 2019-08-30 DIAGNOSIS — M5489 Other dorsalgia: Secondary | ICD-10-CM | POA: Diagnosis not present

## 2019-08-30 DIAGNOSIS — Y93F2 Activity, caregiving, lifting: Secondary | ICD-10-CM | POA: Insufficient documentation

## 2019-08-30 DIAGNOSIS — Y999 Unspecified external cause status: Secondary | ICD-10-CM | POA: Diagnosis not present

## 2019-08-30 DIAGNOSIS — C3490 Malignant neoplasm of unspecified part of unspecified bronchus or lung: Secondary | ICD-10-CM | POA: Diagnosis not present

## 2019-08-30 DIAGNOSIS — J449 Chronic obstructive pulmonary disease, unspecified: Secondary | ICD-10-CM | POA: Insufficient documentation

## 2019-08-30 DIAGNOSIS — R52 Pain, unspecified: Secondary | ICD-10-CM | POA: Diagnosis not present

## 2019-08-30 DIAGNOSIS — Z7951 Long term (current) use of inhaled steroids: Secondary | ICD-10-CM | POA: Insufficient documentation

## 2019-08-30 DIAGNOSIS — M545 Low back pain: Secondary | ICD-10-CM | POA: Diagnosis not present

## 2019-08-30 DIAGNOSIS — S3992XA Unspecified injury of lower back, initial encounter: Secondary | ICD-10-CM | POA: Diagnosis present

## 2019-08-30 MED ORDER — HYDROCODONE-ACETAMINOPHEN 5-325 MG PO TABS: 1.0000 | ORAL_TABLET | Freq: Four times a day (QID) | ORAL | 0 refills | Status: DC | PRN

## 2019-08-30 MED ORDER — METHOCARBAMOL 500 MG PO TABS
500.0000 mg | ORAL_TABLET | Freq: Once | ORAL | Status: AC
  Administered 2019-08-30: 500 mg via ORAL
  Filled 2019-08-30: qty 1

## 2019-08-30 MED ORDER — HYDROCODONE-ACETAMINOPHEN 5-325 MG PO TABS
1.0000 | ORAL_TABLET | Freq: Once | ORAL | Status: AC
  Administered 2019-08-30: 1 via ORAL
  Filled 2019-08-30: qty 1

## 2019-08-30 MED ORDER — METHOCARBAMOL 500 MG PO TABS: 500.0000 mg | ORAL_TABLET | Freq: Three times a day (TID) | ORAL | 0 refills | Status: DC | PRN

## 2019-08-30 NOTE — ED Notes (Signed)
See triage note  States she bent over yesterday  Developed sharp pain to lower back  Denies any fall  But unable to bear wt

## 2019-08-30 NOTE — ED Provider Notes (Signed)
[facility] Emergency Department Provider Note  ____________________________________________   First MD Initiated Contact with Patient 08/30/19 [phone]     (approximate)  I have reviewed the triage vital signs and the nursing notes.   HISTORY  Chief Complaint Back Pain   HPI [name] [name] is a 64 y.o. female presents to the ED with complaint of low back pain.  Patient states that she bent over yesterday and had a sharp pain to her lower back.  She denies any fall but has had difficulty with weightbearing and increased pain with movement.  Patient denies any urinary symptoms or history of kidney stones.  Patient does have a history of left upper lobe pneumonia and has been undergoing radiation therapy.  He denies any previous back problems.      Past Medical History:  Diagnosis Date  . Bowel obstruction (HCC)    x1 month ago  . Cancer ([place])    stomach cancer per pt and currently lung ca  . COPD (chronic obstructive pulmonary disease) ([place])   . Personal history of radiation therapy 2019-2020   lung ca    Patient Active Problem List   Diagnosis Date Noted  . E. coli gastroenteritis 09/09/2016  . Protein calorie malnutrition ([place]) 09/09/2016  . Tobacco abuse counseling 09/09/2016  . Hypokalemia 09/08/2016  . Ischemic bowel disease ([place]) 06/14/2014  . Intestinal angina ([place]) 06/14/2014  . COPD (chronic obstructive pulmonary disease) ([place]) 06/14/2014  . Protein-calorie malnutrition, severe ([place]) 05/27/2014  . Abdominal pain 05/26/2014    Past Surgical History:  Procedure Laterality Date  . COLON SURGERY      Prior to Admission medications   Medication Sig Start Date End Date Taking? Authorizing Provider  acetaminophen (TYLENOL) 160 MG/5ML solution Take 160 mg by mouth every 6 (six) hours as needed for mild pain.     [provider]  albuterol (PROVENTIL) (2.5 MG/3ML) 0.083% nebulizer solution Take 3 mLs (2.5 mg total) by nebulization 3  (three) times daily as needed for wheezing. 09/09/16   Theodoro Grist, MD  atorvastatin (LIPITOR) 40 MG tablet Take 40 mg by mouth daily.    [provider]  budesonide-formoterol (SYMBICORT) 160-4.5 MCG/ACT inhaler Inhale 2 puffs into the lungs 2 (two) times daily.    [provider]  cefUROXime (CEFTIN) 500 MG tablet Take 1 tablet (500 mg total) by mouth 2 (two) times daily. 07/31/19 10/29/19  Cletis Athens, MD  cholestyramine Lucrezia Starch) 4 GM/DOSE powder TAKE 1 SCOOPFUL(4 GRAMS) MIXED IN 4-8 OUNCES OF LIQUID AND DRINK, DAILY 06/27/19   Cletis Athens, MD  ciprofloxacin (CIPRO) 500 MG tablet Take 1 tablet (500 mg total) by mouth 2 (two) times daily. 09/09/16   Theodoro Grist, MD  cycloSPORINE (RESTASIS) 0.05 % ophthalmic emulsion Place 1 drop into both eyes every 12 (twelve) hours.    [provider]  feeding supplement, ENSURE ENLIVE, (ENSURE ENLIVE) LIQD Take 237 mLs by mouth 2 (two) times daily between meals. 09/10/16   Theodoro Grist, MD  fluticasone (FLONASE) 50 MCG/ACT nasal spray Place 2 sprays into both nostrils daily as needed for rhinitis.    [provider]  guaiFENesin-codeine 100-10 MG/5ML syrup Take 5 mLs by mouth every 6 (six) hours as needed for cough. 07/14/18   Harvest Dark, MD  HYDROcodone-acetaminophen (NORCO/VICODIN) 5-325 MG tablet Take 1 tablet by mouth every 6 (six) hours as needed for moderate pain. 08/30/19   Johnn Hai, PA-C  lisinopril (PRINIVIL,ZESTRIL) 40 MG tablet Take 40 mg by  mouth daily.    [provider]  methocarbamol (ROBAXIN) 500 MG tablet Take 1 tablet (500 mg total) by mouth every 8 (eight) hours as needed for muscle spasms. 08/30/19   Johnn Hai, PA-C  mirtazapine (REMERON) 15 MG tablet Take 15 mg by mouth daily.    [provider]  nicotine (NICODERM CQ - DOSED IN MG/24 HOURS) 14 mg/24hr patch Place 1 patch (14 mg total) onto the skin daily. 09/10/16   Theodoro Grist, MD  omeprazole (PRILOSEC) 20 MG  capsule TAKE 1 CAPSULE BY MOUTH TWICE DAILY 07/31/19   Cletis Athens, MD  ondansetron (ZOFRAN ODT) 4 MG disintegrating tablet Take 1 tablet (4 mg total) by mouth every 8 (eight) hours as needed for nausea or vomiting. 08/12/17   Harvest Dark, MD  ondansetron (ZOFRAN ODT) 4 MG disintegrating tablet Take 1 tablet (4 mg total) by mouth every 8 (eight) hours as needed. 07/03/19   Rudene Re, MD  ondansetron (ZOFRAN) 4 MG tablet Take 1 tablet (4 mg total) by mouth every 6 (six) hours as needed for nausea. 05/29/14   Fritzi Mandes, MD  pantoprazole (PROTONIX) 40 MG tablet Take 1 tablet (40 mg total) by mouth daily. 09/09/16 09/09/17  Theodoro Grist, MD  pregabalin (LYRICA) 100 MG capsule Take 100 mg by mouth 3 (three) times daily.     [provider]  pregabalin (LYRICA) 50 MG capsule TAKE 1 CAPSULE BY MOUTH 3 TIMES DAILY 06/27/19   Cletis Athens, MD  sertraline (ZOLOFT) 50 MG tablet TAKE 1 TABLET BY MOUTH TWICE DAILY 07/31/19   Cletis Athens, MD  Vitamin D, Ergocalciferol, (DRISDOL) 50000 units CAPS capsule Take 50,000 Units by mouth every 7 (seven) days.    [provider]  Water For Irrigation, Sterile (FREE WATER) SOLN Place 60 mLs into feeding tube every 8 (eight) hours. Patient not taking: Reported on 09/08/2016 05/29/14   Fritzi Mandes, MD    Allergies Aspirin  Family History  Problem Relation Age of Onset  . Lupus Sister   . Heart failure Neg Hx   . Diabetes Neg Hx     Social History Social History   Tobacco Use  . Smoking status: Current Every Day Smoker    Packs/day: 0.25    Types: Cigarettes  . Smokeless tobacco: Never Used  Vaping Use  . Vaping Use: Never used  Substance Use Topics  . Alcohol use: No  . Drug use: No    Review of Systems Constitutional: No fever/chills Eyes: No visual changes. Cardiovascular: Denies chest pain. Respiratory: Denies shortness of breath. Gastrointestinal: No abdominal pain.  No nausea, no vomiting.  No diarrhea.     Genitourinary: Negative for dysuria. Musculoskeletal: Positive for low back pain. Skin: Negative for rash. Neurological: Negative for headaches, focal weakness or numbness. ___________________________________________   PHYSICAL EXAM:  VITAL SIGNS: ED Triage Vitals [08/30/19 0836]  Enc Vitals Group     BP 127/82     Pulse Rate 65     Resp 20     Temp 98.9 F (37.2 C)     Temp Source Oral     SpO2 98 %     Weight 102 lb 15.3 oz (46.7 kg)     Height 5\' 5"  (1.651 m)     Head Circumference      Peak Flow      Pain Score      Pain Loc      Pain Edu?      Excl. in [place]?  Constitutional: Alert and oriented. Well appearing and in no acute distress. Eyes: Conjunctivae are normal.  Head: Atraumatic. Neck: No stridor.   Cardiovascular: Normal rate, regular rhythm. Grossly normal heart sounds.  Good peripheral circulation. Respiratory: Normal respiratory effort.  No retractions. Lungs CTAB. Gastrointestinal: Soft and nontender. No distention.  Musculoskeletal: She is able move upper and lower extremities with any difficulty.  There is no point tenderness on palpation of the cervical or thoracic spine.  Patient does have tenderness on palpation of the L5-S1 and sacral area along with paravertebral muscles bilaterally.  Physical exam is limited secondary to patient's inability to move without having pain.  Good muscle strength bilaterally to lower extremities.  Skin is intact and no evidence of abrasions or ecchymosis seen. Neurologic:  Normal speech and language. No gross focal neurologic deficits are appreciated.  Skin:  Skin is warm, dry and intact. No rash noted. Psychiatric: Mood and affect are normal. Speech and behavior are normal.  ____________________________________________   LABS (all labs ordered are listed, but only abnormal results are displayed)  Labs Reviewed - No data to display ____________________________________________  RADIOLOGY  Official radiology  report(s): DG Lumbar Spine 2-3 Views  Result Date: 08/30/2019 CLINICAL DATA:  Low back pain following bending yesterday, initial encounter EXAM: LUMBAR SPINE - 3 VIEW COMPARISON:  03/19/2018 FINDINGS: Five lumbar type vertebral bodies are well visualized. Vertebral body height is well maintained. No compression deformity is noted. Mild aortic calcifications are seen. Changes of prior tubal ligation are noted. IMPRESSION: No acute abnormality identified. Electronically Signed   By: Inez Catalina M.D.   On: 08/30/2019 10:57    ____________________________________________   PROCEDURES  Procedure(s) performed (including Critical Care):  Procedures   ____________________________________________   INITIAL IMPRESSION / ASSESSMENT AND PLAN / ED COURSE  As part of my medical decision making, I reviewed the following data within the electronic MEDICAL RECORD NUMBER Notes from prior ED visits and [place] Controlled Substance Database  64 year old female presents to the ED with complaint of low back pain after bending over yesterday and feeling a sharp pain.  Patient has had difficulty with range of motion since that time.  She has a history of lung cancer and is undergoing radiation at this time.   On physical exam there is moderate tenderness on palpation of L5-S1 and sacral area along with the paravertebral muscles.  Range of motion is decreased secondary to increased pain.  Was given Robaxin 500 mg and a hydrocodone while in the emergency department.  Lumbar spine x-rays do not show any compression fracture or acute injury.  Patient was feeling better and was able to move without increased pain.  A prescription for the same medication was sent to her pharmacy.  She is encouraged to follow-up with her PCP if any continued problems.  ____________________________________________   FINAL CLINICAL IMPRESSION(S) / ED DIAGNOSES  Final diagnoses:  Strain of lumbar region, initial encounter     ED Discharge  Orders         Ordered    methocarbamol (ROBAXIN) 500 MG tablet  Every 8 hours PRN     Discontinue  Reprint     08/30/19 1118    HYDROcodone-acetaminophen (NORCO/VICODIN) 5-325 MG tablet  Every 6 hours PRN     Discontinue  Reprint     08/30/19 1118           Note:  This document was prepared using Dragon voice recognition software and may include unintentional dictation errors.  Johnn Hai, PA-C 08/30/19 1458    Lavonia Drafts, MD 08/30/19 1500

## 2019-08-30 NOTE — Discharge Instructions (Addendum)
Follow-up with your primary care provider if any continued problems. Medication was sent to your pharmacy. While taking this medication be aware that this could cause drowsiness and increase your risk for falling. You may also use heat or ice to your back as needed for discomfort. If any worsening of your symptoms such as incontinence of bowel or bladder you are to return to the emergency department immediately.

## 2019-08-30 NOTE — ED Triage Notes (Signed)
Pt presents to ED via [facility] from home with c/o sharp lower back pain that started after bending over yesterday and standing back up. Pt arrives to triage playing on phone, VSS at this time.

## 2019-09-04 ENCOUNTER — Other Ambulatory Visit: Payer: Self-pay

## 2019-09-04 ENCOUNTER — Emergency Department
Admission: EM | Admit: 2019-09-04 | Discharge: 2019-09-04 | Disposition: A | Discharge status: Home / Self Care | Payer: Medicare HMO | Attending: Emergency Medicine | Admitting: Emergency Medicine

## 2019-09-04 ENCOUNTER — Emergency Department: Payer: Medicare HMO

## 2019-09-04 DIAGNOSIS — R0602 Shortness of breath: Secondary | ICD-10-CM | POA: Diagnosis not present

## 2019-09-04 DIAGNOSIS — C3490 Malignant neoplasm of unspecified part of unspecified bronchus or lung: Secondary | ICD-10-CM | POA: Diagnosis not present

## 2019-09-04 DIAGNOSIS — Z5321 Procedure and treatment not carried out due to patient leaving prior to being seen by health care provider: Secondary | ICD-10-CM | POA: Diagnosis not present

## 2019-09-04 DIAGNOSIS — T17920A Food in respiratory tract, part unspecified causing asphyxiation, initial encounter: Secondary | ICD-10-CM | POA: Diagnosis not present

## 2019-09-04 DIAGNOSIS — R4020 Unspecified coma: Secondary | ICD-10-CM | POA: Diagnosis not present

## 2019-09-04 LAB — CBC
HCT: 43.1 % (ref 36.0–46.0)
Hemoglobin: 14 g/dL (ref 12.0–15.0)
MCH: 33.8 pg (ref 26.0–34.0)
MCHC: 32.5 g/dL (ref 30.0–36.0)
MCV: 104.1 fL — ABNORMAL HIGH (ref 80.0–100.0)
Platelets: 274 K/uL (ref 150–400)
RBC: 4.14 MIL/uL (ref 3.87–5.11)
RDW: 12 % (ref 11.5–15.5)
WBC: 6.2 K/uL (ref 4.0–10.5)
nRBC: 0 % (ref 0.0–0.2)

## 2019-09-04 LAB — BASIC METABOLIC PANEL WITH GFR
Anion gap: 7 (ref 5–15)
BUN: 13 mg/dL (ref 8–23)
CO2: 28 mmol/L (ref 22–32)
Calcium: 8.9 mg/dL (ref 8.9–10.3)
Chloride: 105 mmol/L (ref 98–111)
Creatinine, Ser: 0.63 mg/dL (ref 0.44–1.00)
GFR calc Af Amer: >60 mL/min
GFR calc non Af Amer: >60 mL/min
Glucose, Bld: 98 mg/dL (ref 70–99)
Potassium: 4.8 mmol/L (ref 3.5–5.1)
Sodium: 140 mmol/L (ref 135–145)

## 2019-09-04 LAB — TROPONIN I (HIGH SENSITIVITY): Troponin I (High Sensitivity): 5 ng/L (ref <18)

## 2019-09-04 NOTE — ED Triage Notes (Signed)
First nurse note- woke up and had difficulty getting breath. Family thought she was choking.  EMS brought pt.  No airway issues, VSS with EMS.  Talking in full sentences.

## 2019-09-04 NOTE — ED Triage Notes (Signed)
Pt comes POV with an episode of SOB. Pt reports better now but it's happened several times and it gets worse every time. Unsure if choking vs SOB.

## 2019-09-10 DIAGNOSIS — R531 Weakness: Secondary | ICD-10-CM | POA: Diagnosis not present

## 2019-09-10 DIAGNOSIS — J42 Unspecified chronic bronchitis: Secondary | ICD-10-CM | POA: Diagnosis not present

## 2019-09-13 ENCOUNTER — Ambulatory Visit: Payer: Medicare HMO | Admitting: Internal Medicine

## 2019-09-17 ENCOUNTER — Encounter: Payer: Self-pay | Admitting: Internal Medicine

## 2019-09-17 ENCOUNTER — Other Ambulatory Visit: Payer: Self-pay

## 2019-09-17 ENCOUNTER — Ambulatory Visit (INDEPENDENT_AMBULATORY_CARE_PROVIDER_SITE_OTHER): Payer: Medicare HMO | Admitting: Internal Medicine

## 2019-09-17 VITALS — BP 150/57 | HR 62 | Ht 65.0 in | Wt 89.4 lb

## 2019-09-17 DIAGNOSIS — R6889 Other general symptoms and signs: Secondary | ICD-10-CM | POA: Diagnosis not present

## 2019-09-17 DIAGNOSIS — R627 Adult failure to thrive: Secondary | ICD-10-CM | POA: Diagnosis not present

## 2019-09-17 DIAGNOSIS — K559 Vascular disorder of intestine, unspecified: Secondary | ICD-10-CM

## 2019-09-17 DIAGNOSIS — J410 Simple chronic bronchitis: Secondary | ICD-10-CM | POA: Diagnosis not present

## 2019-09-17 DIAGNOSIS — E440 Moderate protein-calorie malnutrition: Secondary | ICD-10-CM | POA: Diagnosis not present

## 2019-09-17 DIAGNOSIS — Z716 Tobacco abuse counseling: Secondary | ICD-10-CM

## 2019-09-17 DIAGNOSIS — I429 Cardiomyopathy, unspecified: Secondary | ICD-10-CM | POA: Diagnosis not present

## 2019-09-17 MED ORDER — ACETAMINOPHEN 160 MG/5ML PO SOLN: 160.0000 mg | Freq: Four times a day (QID) | ORAL | 6 refills | Status: AC | PRN

## 2019-09-17 MED ORDER — BUPROPION HCL ER (SR) 100 MG PO TB12: 100.0000 mg | ORAL_TABLET | Freq: Two times a day (BID) | ORAL | 2 refills | Status: DC

## 2019-09-17 NOTE — Assessment & Plan Note (Signed)
-   I instructed the patient to stop smoking and provided them with smoking cessation materials.  - I informed the patient that smoking puts them at increased risk for cancer, COPD, hypertension, and more.  - Informed the patient to seek help if they begin to have trouble breathing, develop chest pain, start to cough up blood, feel faint, or pass out.  

## 2019-09-17 NOTE — Assessment & Plan Note (Signed)
Short gut syndrome

## 2019-09-17 NOTE — Progress Notes (Signed)
Established Patient Office Visit  SUBJECTIVE:  Subjective  Patient ID: [name] [name], female    DOB: 15-Oct-1955  Age: 64 y.o. MRN: [id number]  CC:  Chief Complaint  Patient presents with  . Follow-up    er follow up    HPI [name] [name] is a 64 y.o. female presenting today for for a follow up to her recent ER visits.   She presented to the ED on 08/30/2019 with complaints of low back pain. She denied any falls but had increased difficulty with weightbearing and movement. Lumbar Spine Xray revealed no acute abnormalities. She was given Robaxin 500mg  8mg  every 8 hrs and Norco/Vicodin 5-325mg  every 6 hrs.   She presented to the ED again on 09/04/2019 with complaints of shortness of breath. EKG revealed Normal sinus rhythm; Moderate voltage criteria for LVH, may be normal variant; Anteroseptal infarct; Abnormal ECG.  She states that she experiences chest pain on occasion, maybe 2-3 times per month, where each episode will last 5-10 minutes. She describes the pain as sharp and radiating to her right side from the center of her chest. She also notes shortness of breath and weakness that is worse with exertion.   She wants to quit smoking. She notes that she has tried the nicotine patch and gum without any relief. She would like a prescription for something to help her stop smoking.   Past Medical History:  Diagnosis Date  . Bowel obstruction (HCC)    x1 month ago  . Cancer ([place])    stomach cancer per pt and currently lung ca  . COPD (chronic obstructive pulmonary disease) ([place])   . Personal history of radiation therapy 2019-2020   lung ca    Past Surgical History:  Procedure Laterality Date  . COLON SURGERY      Family History  Problem Relation Age of Onset  . Lupus Sister   . Heart failure Neg Hx   . Diabetes Neg Hx     Social History   Socioeconomic History  . Marital status: Widowed    Spouse name: Not on file  . Number of children: Not on file  . Years of  education: Not on file  . Highest education level: Not on file  Occupational History  . Occupation: on disability  Tobacco Use  . Smoking status: Current Every Day Smoker    Packs/day: 0.25    Types: Cigarettes  . Smokeless tobacco: Never Used  Vaping Use  . Vaping Use: Never used  Substance and Sexual Activity  . Alcohol use: No  . Drug use: No  . Sexual activity: Not on file  Other Topics Concern  . Not on file  Social History Narrative  . Not on file   Social Determinants of Health   Financial Resource Strain:   . Difficulty of Paying Living Expenses: Not on file  Food Insecurity:   . Worried About Charity fundraiser in the Last Year: Not on file  . Ran Out of Food in the Last Year: Not on file  Transportation Needs:   . Lack of Transportation (Medical): Not on file  . Lack of Transportation (Non-Medical): Not on file  Physical Activity:   . Days of Exercise per Week: Not on file  . Minutes of Exercise per Session: Not on file  Stress:   . Feeling of Stress : Not on file  Social Connections:   . Frequency of Communication with Friends and Family: Not on file  . Frequency  of Social Gatherings with Friends and Family: Not on file  . Attends Religious Services: Not on file  . Active Member of Clubs or Organizations: Not on file  . Attends Archivist Meetings: Not on file  . Marital Status: Not on file  Intimate Partner Violence:   . Fear of Current or Ex-Partner: Not on file  . Emotionally Abused: Not on file  . Physically Abused: Not on file  . Sexually Abused: Not on file     Current Outpatient Medications:  .  acetaminophen (TYLENOL) 160 MG/5ML solution, Take 160 mg by mouth every 6 (six) hours as needed for mild pain. , Disp: , Rfl:  .  albuterol (PROVENTIL) (2.5 MG/3ML) 0.083% nebulizer solution, Take 3 mLs (2.5 mg total) by nebulization 3 (three) times daily as needed for wheezing., Disp: 75 mL, Rfl: 12 .  atorvastatin (LIPITOR) 40 MG tablet, Take  40 mg by mouth daily., Disp: , Rfl:  .  budesonide-formoterol (SYMBICORT) 160-4.5 MCG/ACT inhaler, Inhale 2 puffs into the lungs 2 (two) times daily., Disp: , Rfl:  .  cefUROXime (CEFTIN) 500 MG tablet, Take 1 tablet (500 mg total) by mouth 2 (two) times daily., Disp: 180 tablet, Rfl: 3 .  cholestyramine (QUESTRAN) 4 GM/DOSE powder, TAKE 1 SCOOPFUL(4 GRAMS) MIXED IN 4-8 OUNCES OF LIQUID AND DRINK, DAILY, Disp: 378 g, Rfl: 3 .  ciprofloxacin (CIPRO) 500 MG tablet, Take 1 tablet (500 mg total) by mouth 2 (two) times daily., Disp: 10 tablet, Rfl: 0 .  cycloSPORINE (RESTASIS) 0.05 % ophthalmic emulsion, Place 1 drop into both eyes every 12 (twelve) hours., Disp: , Rfl:  .  feeding supplement, ENSURE ENLIVE, (ENSURE ENLIVE) LIQD, Take 237 mLs by mouth 2 (two) times daily between meals., Disp: 237 mL, Rfl: 12 .  fluticasone (FLONASE) 50 MCG/ACT nasal spray, Place 2 sprays into both nostrils daily as needed for rhinitis., Disp: , Rfl:  .  guaiFENesin-codeine 100-10 MG/5ML syrup, Take 5 mLs by mouth every 6 (six) hours as needed for cough., Disp: 120 mL, Rfl: 0 .  HYDROcodone-acetaminophen (NORCO/VICODIN) 5-325 MG tablet, Take 1 tablet by mouth every 6 (six) hours as needed for moderate pain., Disp: 15 tablet, Rfl: 0 .  lisinopril (PRINIVIL,ZESTRIL) 40 MG tablet, Take 40 mg by mouth daily., Disp: , Rfl:  .  methocarbamol (ROBAXIN) 500 MG tablet, Take 1 tablet (500 mg total) by mouth every 8 (eight) hours as needed for muscle spasms., Disp: 12 tablet, Rfl: 0 .  mirtazapine (REMERON) 15 MG tablet, Take 15 mg by mouth daily., Disp: , Rfl:  .  nicotine (NICODERM CQ - DOSED IN MG/24 HOURS) 14 mg/24hr patch, Place 1 patch (14 mg total) onto the skin daily., Disp: 28 patch, Rfl: 0 .  omeprazole (PRILOSEC) 20 MG capsule, TAKE 1 CAPSULE BY MOUTH TWICE DAILY, Disp: 180 capsule, Rfl: 0 .  ondansetron (ZOFRAN ODT) 4 MG disintegrating tablet, Take 1 tablet (4 mg total) by mouth every 8 (eight) hours as needed for nausea  or vomiting., Disp: 20 tablet, Rfl: 0 .  ondansetron (ZOFRAN ODT) 4 MG disintegrating tablet, Take 1 tablet (4 mg total) by mouth every 8 (eight) hours as needed., Disp: 20 tablet, Rfl: 0 .  ondansetron (ZOFRAN) 4 MG tablet, Take 1 tablet (4 mg total) by mouth every 6 (six) hours as needed for nausea., Disp: 20 tablet, Rfl: 0 .  pregabalin (LYRICA) 100 MG capsule, Take 100 mg by mouth 3 (three) times daily. , Disp: , Rfl:  .  pregabalin (LYRICA) 50 MG capsule, TAKE 1 CAPSULE BY MOUTH 3 TIMES DAILY, Disp: 90 capsule, Rfl: 0 .  sertraline (ZOLOFT) 50 MG tablet, TAKE 1 TABLET BY MOUTH TWICE DAILY, Disp: 60 tablet, Rfl: 1 .  Vitamin D, Ergocalciferol, (DRISDOL) 50000 units CAPS capsule, Take 50,000 Units by mouth every 7 (seven) days., Disp: , Rfl:  .  Water For Irrigation, Sterile (FREE WATER) SOLN, Place 60 mLs into feeding tube every 8 (eight) hours., Disp: 60 mL, Rfl: 0 .  buPROPion (WELLBUTRIN SR) 100 MG 12 hr tablet, Take 1 tablet (100 mg total) by mouth 2 (two) times daily., Disp: 60 tablet, Rfl: 2 .  pantoprazole (PROTONIX) 40 MG tablet, Take 1 tablet (40 mg total) by mouth daily., Disp: 30 tablet, Rfl: 1   Allergies  Allergen Reactions  . Aspirin Hives and Itching    ROS Review of Systems  Constitutional: Negative.   HENT: Negative.   Eyes: Negative.   Respiratory: Positive for shortness of breath.   Cardiovascular: Positive for chest pain (sharp, radiates to right, 5-10 min at a time, 2-3 times per month).  Gastrointestinal: Negative.   Endocrine: Negative.   Genitourinary: Negative.   Musculoskeletal: Negative.   Skin: Negative.   Allergic/Immunologic: Negative.   Neurological: Positive for weakness.  Hematological: Negative.   Psychiatric/Behavioral: Negative.   All other systems reviewed and are negative.    OBJECTIVE:    Physical Exam Vitals reviewed.  Constitutional:      Appearance: Normal appearance.     Comments: Ambulating with a cane  HENT:     Mouth/Throat:      Mouth: Mucous membranes are moist.  Eyes:     Pupils: Pupils are equal, round, and reactive to light.  Neck:     Vascular: Carotid bruit (right) present.  Cardiovascular:     Rate and Rhythm: Normal rate and regular rhythm.     Pulses: Normal pulses.     Heart sounds: Murmur heard.  Systolic murmur is present with a grade of 2/6.   Pulmonary:     Effort: Pulmonary effort is normal.     Breath sounds: Normal breath sounds.  Abdominal:     General: Bowel sounds are normal.     Palpations: Abdomen is soft. There is no hepatomegaly, splenomegaly or mass.     Tenderness: There is no abdominal tenderness.     Hernia: No hernia is present.  Musculoskeletal:        General: No tenderness.     Cervical back: Neck supple.     Right lower leg: No edema.     Left lower leg: No edema.  Skin:    Findings: No rash.  Neurological:     Mental Status: She is alert and oriented to person, place, and time.     Motor: No weakness.  Psychiatric:        Mood and Affect: Mood and affect normal.        Behavior: Behavior normal.     BP (!) 150/57   Pulse 62   Ht 5\' 5"  (1.651 m)   Wt 89 lb 6.4 oz (40.6 kg)   BMI 14.88 kg/m  Wt Readings from Last 3 Encounters:  09/17/19 89 lb 6.4 oz (40.6 kg)  08/30/19 102 lb 15.3 oz (46.7 kg)  07/02/19 103 lb (46.7 kg)    Health Maintenance Due  Topic Date Due  . Hepatitis C Screening  Never done  . TETANUS/TDAP  Never done  . PAP SMEAR-Modifier  Never done  . COLONOSCOPY  Never done  . INFLUENZA VACCINE  08/25/2019    There are no preventive care reminders to display for this patient.  CBC Latest Ref Rng & Units 09/04/2019 07/02/2019 07/14/2018  WBC 4.0 - 10.5 K/uL 6.2 16.0(H) 9.6  Hemoglobin 12.0 - 15.0 g/dL 14.0 13.8 12.9  Hematocrit 36 - 46 % 43.1 42.1 39.0  Platelets 150 - 400 K/uL 274 311 295   CMP Latest Ref Rng & Units 09/04/2019 07/02/2019 07/14/2018  Glucose 70 - 99 mg/dL 98 92 100(H)  BUN 8 - 23 mg/dL 13 13 13   Creatinine 0.44 - 1.00  mg/dL 0.63 0.65 0.64  Sodium 135 - 145 mmol/L 140 141 138  Potassium 3.5 - 5.1 mmol/L 4.8 3.6 4.6  Chloride 98 - 111 mmol/L 105 109 108  CO2 22 - 32 mmol/L 28 23 22   Calcium 8.9 - 10.3 mg/dL 8.9 9.0 9.1  Total Protein 6.5 - 8.1 g/dL - 7.6 -  Total Bilirubin 0.3 - 1.2 mg/dL - 0.6 -  Alkaline Phos 38 - 126 U/L - 64 -  AST 15 - 41 U/L - 20 -  ALT 0 - 44 U/L - 18 -    No results found for: TSH Lab Results  Component Value Date   ALBUMIN 4.5 07/02/2019   ANIONGAP 7 09/04/2019   No results found for: CHOL, HDL, LDLCALC, CHOLHDL Lab Results  Component Value Date   TRIG 107 05/30/2014   No results found for: HGBA1C    ASSESSMENT & PLAN:   Problem List Items Addressed This Visit      Cardiovascular and Mediastinum   Ischemic bowel disease ([place])    stable      Cardiomyopathy ([place])    Will get echo        Respiratory   COPD (chronic obstructive pulmonary disease) ([place]) - Primary (Chronic)    - I instructed the patient to stop smoking and provided them with smoking cessation materials.  - I informed the patient that smoking puts them at increased risk for cancer, COPD, hypertension, and more.  - Informed the patient to seek help if they begin to have trouble breathing, develop chest pain, start to cough up blood, feel faint, or pass out.          Other   Protein calorie malnutrition (HCC)    Pt  Take  Boost  Daily 3 cans      Tobacco abuse counseling   Relevant Medications   buPROPion (WELLBUTRIN SR) 100 MG 12 hr tablet   Adult failure to thrive    Short gut syndrome         Meds ordered this encounter  Medications  . buPROPion (WELLBUTRIN SR) 100 MG 12 hr tablet    Sig: Take 1 tablet (100 mg total) by mouth 2 (two) times daily.    Dispense:  60 tablet    Refill:  2     Follow-up: No follow-ups on file.    Dr. Jane [facility] [address] [place], [place] [address]   By signing my name below, I, [employer], attest that  this documentation has been prepared under the direction and in the presence of Cletis Athens, MD. Electronically Signed: Cletis Athens, MD 09/17/19, 11:35 AM   I personally performed the services described in this documentation, which was SCRIBED in my presence. The recorded information has been reviewed and considered accurate. It has been edited as necessary during review. Viann Shove  Lavera Guise, MD

## 2019-09-17 NOTE — Assessment & Plan Note (Signed)
Pt  Take  Boost  Daily 3 cans

## 2019-09-17 NOTE — Addendum Note (Signed)
Addended by: Alois Cliche on: 09/17/2019 11:56 AM   Modules accepted: Orders

## 2019-09-17 NOTE — Assessment & Plan Note (Signed)
Will get echo

## 2019-09-17 NOTE — Assessment & Plan Note (Signed)
stable °

## 2019-09-25 DIAGNOSIS — N6311 Unspecified lump in the right breast, upper outer quadrant: Secondary | ICD-10-CM | POA: Diagnosis not present

## 2019-09-25 DIAGNOSIS — K551 Chronic vascular disorders of intestine: Secondary | ICD-10-CM | POA: Diagnosis not present

## 2019-09-25 DIAGNOSIS — R921 Mammographic calcification found on diagnostic imaging of breast: Secondary | ICD-10-CM | POA: Diagnosis not present

## 2019-09-25 DIAGNOSIS — R6889 Other general symptoms and signs: Secondary | ICD-10-CM | POA: Diagnosis not present

## 2019-09-25 DIAGNOSIS — M797 Fibromyalgia: Secondary | ICD-10-CM | POA: Diagnosis not present

## 2019-09-25 DIAGNOSIS — I10 Essential (primary) hypertension: Secondary | ICD-10-CM | POA: Diagnosis not present

## 2019-09-25 DIAGNOSIS — R0989 Other specified symptoms and signs involving the circulatory and respiratory systems: Secondary | ICD-10-CM | POA: Diagnosis not present

## 2019-09-26 DIAGNOSIS — R531 Weakness: Secondary | ICD-10-CM | POA: Diagnosis not present

## 2019-09-26 DIAGNOSIS — R6889 Other general symptoms and signs: Secondary | ICD-10-CM | POA: Diagnosis not present

## 2019-09-26 DIAGNOSIS — R269 Unspecified abnormalities of gait and mobility: Secondary | ICD-10-CM | POA: Diagnosis not present

## 2019-09-26 DIAGNOSIS — R29898 Other symptoms and signs involving the musculoskeletal system: Secondary | ICD-10-CM | POA: Diagnosis not present

## 2019-10-04 DIAGNOSIS — N6311 Unspecified lump in the right breast, upper outer quadrant: Secondary | ICD-10-CM | POA: Diagnosis not present

## 2019-10-04 DIAGNOSIS — R928 Other abnormal and inconclusive findings on diagnostic imaging of breast: Secondary | ICD-10-CM | POA: Diagnosis not present

## 2019-10-04 DIAGNOSIS — R6889 Other general symptoms and signs: Secondary | ICD-10-CM | POA: Diagnosis not present

## 2019-10-09 ENCOUNTER — Other Ambulatory Visit: Payer: Self-pay

## 2019-10-09 DIAGNOSIS — R6889 Other general symptoms and signs: Secondary | ICD-10-CM | POA: Diagnosis not present

## 2019-10-09 DIAGNOSIS — M797 Fibromyalgia: Secondary | ICD-10-CM | POA: Diagnosis not present

## 2019-10-09 DIAGNOSIS — I10 Essential (primary) hypertension: Secondary | ICD-10-CM | POA: Diagnosis not present

## 2019-10-09 DIAGNOSIS — R0600 Dyspnea, unspecified: Secondary | ICD-10-CM | POA: Diagnosis not present

## 2019-10-09 DIAGNOSIS — K912 Postsurgical malabsorption, not elsewhere classified: Secondary | ICD-10-CM | POA: Diagnosis not present

## 2019-10-09 DIAGNOSIS — Z23 Encounter for immunization: Secondary | ICD-10-CM | POA: Diagnosis not present

## 2019-10-11 DIAGNOSIS — J42 Unspecified chronic bronchitis: Secondary | ICD-10-CM | POA: Diagnosis not present

## 2019-10-11 DIAGNOSIS — R531 Weakness: Secondary | ICD-10-CM | POA: Diagnosis not present

## 2019-10-17 DIAGNOSIS — R6889 Other general symptoms and signs: Secondary | ICD-10-CM | POA: Diagnosis not present

## 2019-10-22 ENCOUNTER — Other Ambulatory Visit: Payer: Medicare HMO

## 2019-10-24 DIAGNOSIS — R29898 Other symptoms and signs involving the musculoskeletal system: Secondary | ICD-10-CM | POA: Diagnosis not present

## 2019-10-24 DIAGNOSIS — R6889 Other general symptoms and signs: Secondary | ICD-10-CM | POA: Diagnosis not present

## 2019-10-24 DIAGNOSIS — R531 Weakness: Secondary | ICD-10-CM | POA: Diagnosis not present

## 2019-10-24 DIAGNOSIS — R269 Unspecified abnormalities of gait and mobility: Secondary | ICD-10-CM | POA: Diagnosis not present

## 2019-10-31 DIAGNOSIS — R6889 Other general symptoms and signs: Secondary | ICD-10-CM | POA: Diagnosis not present

## 2019-10-31 DIAGNOSIS — R531 Weakness: Secondary | ICD-10-CM | POA: Diagnosis not present

## 2019-10-31 DIAGNOSIS — R29898 Other symptoms and signs involving the musculoskeletal system: Secondary | ICD-10-CM | POA: Diagnosis not present

## 2019-10-31 DIAGNOSIS — R269 Unspecified abnormalities of gait and mobility: Secondary | ICD-10-CM | POA: Diagnosis not present

## 2019-11-06 ENCOUNTER — Ambulatory Visit: Payer: Medicare HMO | Admitting: Internal Medicine

## 2019-11-10 DIAGNOSIS — R531 Weakness: Secondary | ICD-10-CM | POA: Diagnosis not present

## 2019-11-10 DIAGNOSIS — J42 Unspecified chronic bronchitis: Secondary | ICD-10-CM | POA: Diagnosis not present

## 2019-11-13 DIAGNOSIS — R0989 Other specified symptoms and signs involving the circulatory and respiratory systems: Secondary | ICD-10-CM | POA: Diagnosis not present

## 2019-11-13 DIAGNOSIS — K551 Chronic vascular disorders of intestine: Secondary | ICD-10-CM | POA: Diagnosis not present

## 2019-11-13 DIAGNOSIS — I10 Essential (primary) hypertension: Secondary | ICD-10-CM | POA: Diagnosis not present

## 2019-11-13 DIAGNOSIS — R6889 Other general symptoms and signs: Secondary | ICD-10-CM | POA: Diagnosis not present

## 2019-11-14 DIAGNOSIS — R29898 Other symptoms and signs involving the musculoskeletal system: Secondary | ICD-10-CM | POA: Diagnosis not present

## 2019-11-14 DIAGNOSIS — R531 Weakness: Secondary | ICD-10-CM | POA: Diagnosis not present

## 2019-11-14 DIAGNOSIS — R6889 Other general symptoms and signs: Secondary | ICD-10-CM | POA: Diagnosis not present

## 2019-11-14 DIAGNOSIS — R269 Unspecified abnormalities of gait and mobility: Secondary | ICD-10-CM | POA: Diagnosis not present

## 2019-12-02 ENCOUNTER — Encounter: Payer: Self-pay | Admitting: Internal Medicine

## 2019-12-02 ENCOUNTER — Ambulatory Visit (INDEPENDENT_AMBULATORY_CARE_PROVIDER_SITE_OTHER): Payer: Medicare HMO | Admitting: Internal Medicine

## 2019-12-02 ENCOUNTER — Other Ambulatory Visit: Payer: Self-pay

## 2019-12-02 VITALS — BP 140/82 | HR 87 | Ht 66.0 in | Wt 94.6 lb

## 2019-12-02 DIAGNOSIS — C3490 Malignant neoplasm of unspecified part of unspecified bronchus or lung: Secondary | ICD-10-CM | POA: Insufficient documentation

## 2019-12-02 DIAGNOSIS — Z85118 Personal history of other malignant neoplasm of bronchus and lung: Secondary | ICD-10-CM

## 2019-12-02 DIAGNOSIS — I10 Essential (primary) hypertension: Secondary | ICD-10-CM | POA: Diagnosis not present

## 2019-12-02 DIAGNOSIS — Z716 Tobacco abuse counseling: Secondary | ICD-10-CM | POA: Diagnosis not present

## 2019-12-02 DIAGNOSIS — K559 Vascular disorder of intestine, unspecified: Secondary | ICD-10-CM | POA: Diagnosis not present

## 2019-12-02 DIAGNOSIS — R6889 Other general symptoms and signs: Secondary | ICD-10-CM | POA: Diagnosis not present

## 2019-12-02 HISTORY — DX: Essential (primary) hypertension: I10

## 2019-12-02 NOTE — Assessment & Plan Note (Signed)
Patient has a history of adenocarcinoma of the lung with meta stasis.  She is being treated for metastatic disease in [place].

## 2019-12-02 NOTE — Assessment & Plan Note (Signed)
Patient has an ischemic bowel disease also has a short gut syndrome.  She complains of increased gas some abdominal discomfort off and on.  Intermittent diarrhea.  Patient is being followed up by [facility]

## 2019-12-02 NOTE — Assessment & Plan Note (Signed)
Blood pressure stable on present medication.

## 2019-12-02 NOTE — Assessment & Plan Note (Signed)
Patient still smoke intermittently so she was advised to stop smoking completely.

## 2019-12-02 NOTE — Progress Notes (Signed)
Established Patient Office Visit  Subjective:  Patient ID: [name] [name], female    DOB: [birth year]  Age: 64 y.o. MRN: [id number]  CC:  Chief Complaint  Patient presents with  . Hypertension  . Anxiety    HPI  TEREASA [name] presents for checkup.  Patient has a adenocarcinoma of the lung .  Also has a sickle cell trait depression, fibromyalgia, COPD.  Also has a history of drug abuse.  She has mesenteric ischemia.  Patient is being followed up in [facility].  She had a surgery for the small bowel resection with short gut syndrome.  Blood pressure is stable right now 140/82.  She denies any chest pain , patient   Past Medical History:  Diagnosis Date  . Bowel obstruction (HCC)    x1 month ago  . Cancer ([place])    stomach cancer per pt and currently lung ca  . COPD (chronic obstructive pulmonary disease) ([place])   . Essential hypertension 12/02/2019  . Personal history of radiation therapy 2019-2020   lung ca    Past Surgical History:  Procedure Laterality Date  . COLON SURGERY      Family History  Problem Relation Age of Onset  . Lupus Sister   . Heart failure Neg Hx   . Diabetes Neg Hx     Social History   Socioeconomic History  . Marital status: Widowed    Spouse name: Not on file  . Number of children: Not on file  . Years of education: Not on file  . Highest education level: Not on file  Occupational History  . Occupation: on disability  Tobacco Use  . Smoking status: Current Every Day Smoker    Packs/day: 0.25    Types: Cigarettes  . Smokeless tobacco: Never Used  Vaping Use  . Vaping Use: Never used  Substance and Sexual Activity  . Alcohol use: No  . Drug use: No  . Sexual activity: Not on file  Other Topics Concern  . Not on file  Social History Narrative  . Not on file   Social Determinants of Health   Financial Resource Strain:   . Difficulty of Paying Living Expenses: Not on file  Food Insecurity:   . Worried About Charity fundraiser in  the Last Year: Not on file  . Ran Out of Food in the Last Year: Not on file  Transportation Needs:   . Lack of Transportation (Medical): Not on file  . Lack of Transportation (Non-Medical): Not on file  Physical Activity:   . Days of Exercise per Week: Not on file  . Minutes of Exercise per Session: Not on file  Stress:   . Feeling of Stress : Not on file  Social Connections:   . Frequency of Communication with Friends and Family: Not on file  . Frequency of Social Gatherings with Friends and Family: Not on file  . Attends Religious Services: Not on file  . Active Member of Clubs or Organizations: Not on file  . Attends Archivist Meetings: Not on file  . Marital Status: Not on file  Intimate Partner Violence:   . Fear of Current or Ex-Partner: Not on file  . Emotionally Abused: Not on file  . Physically Abused: Not on file  . Sexually Abused: Not on file     Current Outpatient Medications:  .  acetaminophen (TYLENOL) 160 MG/5ML solution, Take 5 mLs (160 mg total) by mouth every 6 (six) hours as needed  for mild pain., Disp: 120 mL, Rfl: 6 .  albuterol (PROVENTIL) (2.5 MG/3ML) 0.083% nebulizer solution, Take 3 mLs (2.5 mg total) by nebulization 3 (three) times daily as needed for wheezing., Disp: 75 mL, Rfl: 12 .  atorvastatin (LIPITOR) 40 MG tablet, Take 40 mg by mouth daily., Disp: , Rfl:  .  budesonide-formoterol (SYMBICORT) 160-4.5 MCG/ACT inhaler, Inhale 2 puffs into the lungs 2 (two) times daily., Disp: , Rfl:  .  buPROPion (WELLBUTRIN SR) 100 MG 12 hr tablet, Take 1 tablet (100 mg total) by mouth 2 (two) times daily., Disp: 60 tablet, Rfl: 2 .  cholestyramine (QUESTRAN) 4 GM/DOSE powder, TAKE 1 SCOOPFUL(4 GRAMS) MIXED IN 4-8 OUNCES OF LIQUID AND DRINK, DAILY, Disp: 378 g, Rfl: 3 .  ciprofloxacin (CIPRO) 500 MG tablet, Take 1 tablet (500 mg total) by mouth 2 (two) times daily., Disp: 10 tablet, Rfl: 0 .  cycloSPORINE (RESTASIS) 0.05 % ophthalmic emulsion, Place 1 drop  into both eyes every 12 (twelve) hours., Disp: , Rfl:  .  feeding supplement, ENSURE ENLIVE, (ENSURE ENLIVE) LIQD, Take 237 mLs by mouth 2 (two) times daily between meals., Disp: 237 mL, Rfl: 12 .  fluticasone (FLONASE) 50 MCG/ACT nasal spray, Place 2 sprays into both nostrils daily as needed for rhinitis., Disp: , Rfl:  .  guaiFENesin-codeine 100-10 MG/5ML syrup, Take 5 mLs by mouth every 6 (six) hours as needed for cough., Disp: 120 mL, Rfl: 0 .  HYDROcodone-acetaminophen (NORCO/VICODIN) 5-325 MG tablet, Take 1 tablet by mouth every 6 (six) hours as needed for moderate pain., Disp: 15 tablet, Rfl: 0 .  lisinopril (PRINIVIL,ZESTRIL) 40 MG tablet, Take 40 mg by mouth daily., Disp: , Rfl:  .  methocarbamol (ROBAXIN) 500 MG tablet, Take 1 tablet (500 mg total) by mouth every 8 (eight) hours as needed for muscle spasms., Disp: 12 tablet, Rfl: 0 .  mirtazapine (REMERON) 15 MG tablet, Take 15 mg by mouth daily., Disp: , Rfl:  .  nicotine (NICODERM CQ - DOSED IN MG/24 HOURS) 14 mg/24hr patch, Place 1 patch (14 mg total) onto the skin daily., Disp: 28 patch, Rfl: 0 .  omeprazole (PRILOSEC) 20 MG capsule, TAKE 1 CAPSULE BY MOUTH TWICE DAILY, Disp: 180 capsule, Rfl: 0 .  ondansetron (ZOFRAN ODT) 4 MG disintegrating tablet, Take 1 tablet (4 mg total) by mouth every 8 (eight) hours as needed for nausea or vomiting., Disp: 20 tablet, Rfl: 0 .  ondansetron (ZOFRAN ODT) 4 MG disintegrating tablet, Take 1 tablet (4 mg total) by mouth every 8 (eight) hours as needed., Disp: 20 tablet, Rfl: 0 .  ondansetron (ZOFRAN) 4 MG tablet, Take 1 tablet (4 mg total) by mouth every 6 (six) hours as needed for nausea., Disp: 20 tablet, Rfl: 0 .  pregabalin (LYRICA) 100 MG capsule, Take 100 mg by mouth 3 (three) times daily. , Disp: , Rfl:  .  pregabalin (LYRICA) 50 MG capsule, TAKE 1 CAPSULE BY MOUTH 3 TIMES DAILY, Disp: 90 capsule, Rfl: 0 .  sertraline (ZOLOFT) 50 MG tablet, TAKE 1 TABLET BY MOUTH TWICE DAILY, Disp: 60 tablet,  Rfl: 1 .  Vitamin D, Ergocalciferol, (DRISDOL) 50000 units CAPS capsule, Take 50,000 Units by mouth every 7 (seven) days., Disp: , Rfl:  .  Water For Irrigation, Sterile (FREE WATER) SOLN, Place 60 mLs into feeding tube every 8 (eight) hours., Disp: 60 mL, Rfl: 0 .  pantoprazole (PROTONIX) 40 MG tablet, Take 1 tablet (40 mg total) by mouth daily., Disp: 30 tablet, Rfl: 1  Allergies  Allergen Reactions  . Aspirin Hives and Itching    ROS Review of Systems  Constitutional: Positive for appetite change.  HENT: Negative.   Eyes: Negative.   Respiratory: Negative for cough and choking.   Gastrointestinal: Positive for abdominal pain.  Genitourinary: Negative for difficulty urinating, hematuria and urgency.  Musculoskeletal: Positive for arthralgias and back pain.  Skin: Negative for color change.  Psychiatric/Behavioral: Negative for confusion.      Objective:    Physical Exam Cardiovascular:     Rate and Rhythm: Regular rhythm.  Pulmonary:     Effort: No respiratory distress.     Breath sounds: No wheezing, rhonchi or rales.  Musculoskeletal:        General: Normal range of motion.  Skin:    General: Skin is dry.  Neurological:     General: No focal deficit present.     BP 140/82   Pulse 87   Ht 5\' 6"  (1.676 m)   Wt 94 lb 9.6 oz (42.9 kg)   BMI 15.27 kg/m  Wt Readings from Last 3 Encounters:  12/02/19 94 lb 9.6 oz (42.9 kg)  09/17/19 89 lb 6.4 oz (40.6 kg)  08/30/19 102 lb 15.3 oz (46.7 kg)     Health Maintenance Due  Topic Date Due  . Hepatitis C Screening  Never done  . PAP SMEAR-Modifier  Never done  . COLONOSCOPY  Never done    There are no preventive care reminders to display for this patient.  No results found for: TSH Lab Results  Component Value Date   WBC 6.2 09/04/2019   HGB 14.0 09/04/2019   HCT 43.1 09/04/2019   MCV 104.1 (H) 09/04/2019   PLT 274 09/04/2019   Lab Results  Component Value Date   NA 140 09/04/2019   K 4.8 09/04/2019    CO2 28 09/04/2019   GLUCOSE 98 09/04/2019   BUN 13 09/04/2019   CREATININE 0.63 09/04/2019   BILITOT 0.6 07/02/2019   ALKPHOS 64 07/02/2019   AST 20 07/02/2019   ALT 18 07/02/2019   PROT 7.6 07/02/2019   ALBUMIN 4.5 07/02/2019   CALCIUM 8.9 09/04/2019   ANIONGAP 7 09/04/2019   No results found for: CHOL No results found for: HDL No results found for: [facility] Lab Results  Component Value Date   TRIG 107 05/30/2014   No results found for: CHOLHDL No results found for: HGBA1C    Assessment & Plan:   Problem List Items Addressed This Visit      Cardiovascular and Mediastinum   Ischemic bowel disease ([place])    Patient has an ischemic bowel disease also has a short gut syndrome.  She complains of increased gas some abdominal discomfort off and on.  Intermittent diarrhea.  Patient is being followed up by [facility]      Essential hypertension - Primary    Blood pressure stable on present medication.        Respiratory   Adenocarcinoma of lung [facility])    Patient has a history of adenocarcinoma of the lung with meta stasis.  She is being treated for metastatic disease in [place].        Other   Tobacco abuse counseling    Patient still smoke intermittently so she was advised to stop smoking completely.         No orders of the defined types were placed in this encounter.   Follow-up: No follow-ups on file.    [employer],  MD

## 2019-12-09 ENCOUNTER — Encounter: Payer: Self-pay | Admitting: *Deleted

## 2019-12-09 ENCOUNTER — Other Ambulatory Visit: Payer: Self-pay | Admitting: Internal Medicine

## 2019-12-11 ENCOUNTER — Other Ambulatory Visit: Payer: Self-pay | Admitting: *Deleted

## 2019-12-11 DIAGNOSIS — J42 Unspecified chronic bronchitis: Secondary | ICD-10-CM | POA: Diagnosis not present

## 2019-12-11 DIAGNOSIS — R531 Weakness: Secondary | ICD-10-CM | POA: Diagnosis not present

## 2019-12-11 DIAGNOSIS — R627 Adult failure to thrive: Secondary | ICD-10-CM

## 2019-12-16 ENCOUNTER — Ambulatory Visit: Payer: Medicare HMO | Admitting: Internal Medicine

## 2019-12-23 ENCOUNTER — Other Ambulatory Visit: Payer: Self-pay

## 2019-12-23 ENCOUNTER — Encounter: Payer: Self-pay | Admitting: Family Medicine

## 2019-12-23 ENCOUNTER — Ambulatory Visit (INDEPENDENT_AMBULATORY_CARE_PROVIDER_SITE_OTHER): Payer: Medicare HMO | Admitting: Family Medicine

## 2019-12-23 VITALS — BP 139/60 | HR 60 | Ht 66.0 in | Wt 88.6 lb

## 2019-12-23 DIAGNOSIS — Z716 Tobacco abuse counseling: Secondary | ICD-10-CM | POA: Diagnosis not present

## 2019-12-23 DIAGNOSIS — R627 Adult failure to thrive: Secondary | ICD-10-CM

## 2019-12-23 MED ORDER — ENSURE ENLIVE PO LIQD: 237.0000 mL | Freq: Two times a day (BID) | ORAL | Status: AC

## 2019-12-23 NOTE — Assessment & Plan Note (Signed)
Not ready to try and stop smoking at this time. Smokes 2-3 cigs per day.

## 2019-12-23 NOTE — Progress Notes (Signed)
Established Patient Office Visit  SUBJECTIVE:  Subjective  Patient ID: [name] [name], female    DOB: [birth year]  Age: 64 y.o. MRN: [id number]  CC:  Chief Complaint  Patient presents with  . Follow-up    HPI [name] [name] is a 64 y.o. female presenting today for   Weight loss continued 6 lbs in last 3 weeks.   Past Medical History:  Diagnosis Date  . Bowel obstruction (HCC)    x1 month ago  . Cancer ([place])    stomach cancer per pt and currently lung ca  . COPD (chronic obstructive pulmonary disease) ([place])   . Essential hypertension 12/02/2019  . Personal history of radiation therapy 2019-2020   lung ca    Past Surgical History:  Procedure Laterality Date  . COLON SURGERY      Family History  Problem Relation Age of Onset  . Lupus Sister   . Heart failure Neg Hx   . Diabetes Neg Hx     Social History   Socioeconomic History  . Marital status: Widowed    Spouse name: Not on file  . Number of children: Not on file  . Years of education: Not on file  . Highest education level: Not on file  Occupational History  . Occupation: on disability  Tobacco Use  . Smoking status: Current Every Day Smoker    Packs/day: 0.25    Types: Cigarettes  . Smokeless tobacco: Never Used  Vaping Use  . Vaping Use: Never used  Substance and Sexual Activity  . Alcohol use: No  . Drug use: No  . Sexual activity: Not on file  Other Topics Concern  . Not on file  Social History Narrative  . Not on file   Social Determinants of Health   Financial Resource Strain:   . Difficulty of Paying Living Expenses: Not on file  Food Insecurity:   . Worried About Charity fundraiser in the Last Year: Not on file  . Ran Out of Food in the Last Year: Not on file  Transportation Needs:   . Lack of Transportation (Medical): Not on file  . Lack of Transportation (Non-Medical): Not on file  Physical Activity:   . Days of Exercise per Week: Not on file  . Minutes of Exercise per Session:  Not on file  Stress:   . Feeling of Stress : Not on file  Social Connections:   . Frequency of Communication with Friends and Family: Not on file  . Frequency of Social Gatherings with Friends and Family: Not on file  . Attends Religious Services: Not on file  . Active Member of Clubs or Organizations: Not on file  . Attends Archivist Meetings: Not on file  . Marital Status: Not on file  Intimate Partner Violence:   . Fear of Current or Ex-Partner: Not on file  . Emotionally Abused: Not on file  . Physically Abused: Not on file  . Sexually Abused: Not on file     Current Outpatient Medications:  .  acetaminophen (TYLENOL) 160 MG/5ML solution, Take 5 mLs (160 mg total) by mouth every 6 (six) hours as needed for mild pain., Disp: 120 mL, Rfl: 6 .  albuterol (PROVENTIL) (2.5 MG/3ML) 0.083% nebulizer solution, Take 3 mLs (2.5 mg total) by nebulization 3 (three) times daily as needed for wheezing., Disp: 75 mL, Rfl: 12 .  atorvastatin (LIPITOR) 40 MG tablet, Take 40 mg by mouth daily., Disp: , Rfl:  .  budesonide-formoterol (SYMBICORT) 160-4.5 MCG/ACT inhaler, Inhale 2 puffs into the lungs 2 (two) times daily., Disp: , Rfl:  .  buPROPion (WELLBUTRIN SR) 100 MG 12 hr tablet, Take 1 tablet (100 mg total) by mouth 2 (two) times daily., Disp: 60 tablet, Rfl: 2 .  cholestyramine (QUESTRAN) 4 GM/DOSE powder, TAKE 1 SCOOPFUL(4 GRAMS) MIXED IN 4-8 OUNCES OF LIQUID AND DRINK, DAILY, Disp: 378 g, Rfl: 3 .  ciprofloxacin (CIPRO) 500 MG tablet, Take 1 tablet (500 mg total) by mouth 2 (two) times daily., Disp: 10 tablet, Rfl: 0 .  cycloSPORINE (RESTASIS) 0.05 % ophthalmic emulsion, Place 1 drop into both eyes every 12 (twelve) hours., Disp: , Rfl:  .  feeding supplement, ENSURE ENLIVE, (ENSURE ENLIVE) LIQD, Take 237 mLs by mouth 2 (two) times daily between meals., Disp: 237 mL, Rfl: 12 .  fluticasone (FLONASE) 50 MCG/ACT nasal spray, USE 1 SPRAY INTO EACH NOSTRIL TWICE DAILY, Disp: 16 g, Rfl:  6 .  guaiFENesin-codeine 100-10 MG/5ML syrup, Take 5 mLs by mouth every 6 (six) hours as needed for cough., Disp: 120 mL, Rfl: 0 .  HYDROcodone-acetaminophen (NORCO/VICODIN) 5-325 MG tablet, Take 1 tablet by mouth every 6 (six) hours as needed for moderate pain., Disp: 15 tablet, Rfl: 0 .  lisinopril (PRINIVIL,ZESTRIL) 40 MG tablet, Take 40 mg by mouth daily., Disp: , Rfl:  .  methocarbamol (ROBAXIN) 500 MG tablet, Take 1 tablet (500 mg total) by mouth every 8 (eight) hours as needed for muscle spasms., Disp: 12 tablet, Rfl: 0 .  mirtazapine (REMERON) 15 MG tablet, Take 15 mg by mouth daily., Disp: , Rfl:  .  nicotine (NICODERM CQ - DOSED IN MG/24 HOURS) 14 mg/24hr patch, Place 1 patch (14 mg total) onto the skin daily., Disp: 28 patch, Rfl: 0 .  omeprazole (PRILOSEC) 20 MG capsule, TAKE 1 CAPSULE BY MOUTH TWICE DAILY, Disp: 180 capsule, Rfl: 0 .  ondansetron (ZOFRAN ODT) 4 MG disintegrating tablet, Take 1 tablet (4 mg total) by mouth every 8 (eight) hours as needed for nausea or vomiting., Disp: 20 tablet, Rfl: 0 .  ondansetron (ZOFRAN ODT) 4 MG disintegrating tablet, Take 1 tablet (4 mg total) by mouth every 8 (eight) hours as needed., Disp: 20 tablet, Rfl: 0 .  ondansetron (ZOFRAN) 4 MG tablet, Take 1 tablet (4 mg total) by mouth every 6 (six) hours as needed for nausea., Disp: 20 tablet, Rfl: 0 .  pregabalin (LYRICA) 100 MG capsule, Take 100 mg by mouth 3 (three) times daily. , Disp: , Rfl:  .  pregabalin (LYRICA) 50 MG capsule, TAKE 1 CAPSULE BY MOUTH 3 TIMES DAILY, Disp: 90 capsule, Rfl: 0 .  sertraline (ZOLOFT) 50 MG tablet, TAKE 1 TABLET BY MOUTH TWICE DAILY, Disp: 60 tablet, Rfl: 1 .  Vitamin D, Ergocalciferol, (DRISDOL) 50000 units CAPS capsule, Take 50,000 Units by mouth every 7 (seven) days., Disp: , Rfl:  .  Water For Irrigation, Sterile (FREE WATER) SOLN, Place 60 mLs into feeding tube every 8 (eight) hours., Disp: 60 mL, Rfl: 0 .  pantoprazole (PROTONIX) 40 MG tablet, Take 1 tablet (40  mg total) by mouth daily., Disp: 30 tablet, Rfl: 1  Current Facility-Administered Medications:  .  feeding supplement (ENSURE ENLIVE / ENSURE PLUS) liquid 237 mL, 237 mL, Oral, BID BM, Beckie Salts, FNP   Allergies  Allergen Reactions  . Aspirin Hives and Itching    ROS Review of Systems  Constitutional: Positive for appetite change and unexpected weight change.  HENT: Negative.  Respiratory: Positive for cough.   Cardiovascular: Negative.   Gastrointestinal: Negative.   Genitourinary: Negative.   Psychiatric/Behavioral: Negative.      OBJECTIVE:    Physical Exam Constitutional:      Comments: Cachectic   HENT:     Nose: Nose normal.     Mouth/Throat:     Mouth: Mucous membranes are moist.  Cardiovascular:     Rate and Rhythm: Normal rate.  Pulmonary:     Effort: Pulmonary effort is normal.  Musculoskeletal:        General: Normal range of motion.     BP 139/60   Pulse 60   Ht 5\' 6"  (1.676 m)   Wt 88 lb 9.6 oz (40.2 kg)   BMI 14.30 kg/m  Wt Readings from Last 3 Encounters:  12/23/19 88 lb 9.6 oz (40.2 kg)  12/02/19 94 lb 9.6 oz (42.9 kg)  09/17/19 89 lb 6.4 oz (40.6 kg)    Health Maintenance Due  Topic Date Due  . Hepatitis C Screening  Never done  . PAP SMEAR-Modifier  Never done  . COLONOSCOPY  Never done    There are no preventive care reminders to display for this patient.  CBC Latest Ref Rng & Units 09/04/2019 07/02/2019 07/14/2018  WBC 4.0 - 10.5 K/uL 6.2 16.0(H) 9.6  Hemoglobin 12.0 - 15.0 g/dL 14.0 13.8 12.9  Hematocrit 36 - 46 % 43.1 42.1 39.0  Platelets 150 - 400 K/uL 274 311 295   CMP Latest Ref Rng & Units 09/04/2019 07/02/2019 07/14/2018  Glucose 70 - 99 mg/dL 98 92 100(H)  BUN 8 - 23 mg/dL 13 13 13   Creatinine 0.44 - 1.00 mg/dL 0.63 0.65 0.64  Sodium 135 - 145 mmol/L 140 141 138  Potassium 3.5 - 5.1 mmol/L 4.8 3.6 4.6  Chloride 98 - 111 mmol/L 105 109 108  CO2 22 - 32 mmol/L 28 23 22   Calcium 8.9 - 10.3 mg/dL 8.9 9.0 9.1  Total  Protein 6.5 - 8.1 g/dL - 7.6 -  Total Bilirubin 0.3 - 1.2 mg/dL - 0.6 -  Alkaline Phos 38 - 126 U/L - 64 -  AST 15 - 41 U/L - 20 -  ALT 0 - 44 U/L - 18 -    No results found for: TSH Lab Results  Component Value Date   ALBUMIN 4.5 07/02/2019   ANIONGAP 7 09/04/2019   No results found for: CHOL, HDL, LDLCALC, CHOLHDL Lab Results  Component Value Date   TRIG 107 05/30/2014   No results found for: HGBA1C    ASSESSMENT & PLAN:   Problem List Items Addressed This Visit      Other   Tobacco abuse counseling - Primary    Not ready to try and stop smoking at this time. Smokes 2-3 cigs per day.      Adult failure to thrive    6 lb weight loss in last 3 weeks. Says she is eating 3-4 small meals daily, thinks that her stress level is affecting her weight and appetite as well. She is having trouble affording the Ensure drinks OTC so I am going to rx them to her rx, she will call us back if she has trouble obtaining them.       Relevant Medications   feeding supplement (ENSURE ENLIVE / ENSURE PLUS) liquid 237 mL      Meds ordered this encounter  Medications  . feeding supplement (ENSURE ENLIVE / ENSURE PLUS) liquid 237 mL  Follow-up: No follow-ups on file.    Beckie Salts, [place] [address] [place], [place] [address]

## 2019-12-23 NOTE — Assessment & Plan Note (Addendum)
6 lb weight loss in last 3 weeks. Says she is eating 3-4 small meals daily, thinks that her stress level is affecting her weight and appetite as well. She is having trouble affording the Ensure drinks OTC so I am going to rx them to her rx, she will call us back if she has trouble obtaining them.

## 2020-01-10 DIAGNOSIS — R531 Weakness: Secondary | ICD-10-CM | POA: Diagnosis not present

## 2020-01-10 DIAGNOSIS — J42 Unspecified chronic bronchitis: Secondary | ICD-10-CM | POA: Diagnosis not present

## 2020-01-15 ENCOUNTER — Encounter: Payer: Self-pay | Admitting: Internal Medicine

## 2020-01-15 ENCOUNTER — Ambulatory Visit (INDEPENDENT_AMBULATORY_CARE_PROVIDER_SITE_OTHER): Payer: Medicare HMO | Admitting: Internal Medicine

## 2020-01-15 ENCOUNTER — Other Ambulatory Visit: Payer: Self-pay

## 2020-01-15 VITALS — BP 120/68 | HR 82 | Ht 66.0 in | Wt 90.3 lb

## 2020-01-15 DIAGNOSIS — Z716 Tobacco abuse counseling: Secondary | ICD-10-CM

## 2020-01-15 DIAGNOSIS — J410 Simple chronic bronchitis: Secondary | ICD-10-CM

## 2020-01-15 DIAGNOSIS — K559 Vascular disorder of intestine, unspecified: Secondary | ICD-10-CM | POA: Diagnosis not present

## 2020-01-15 DIAGNOSIS — C3490 Malignant neoplasm of unspecified part of unspecified bronchus or lung: Secondary | ICD-10-CM | POA: Diagnosis not present

## 2020-01-15 DIAGNOSIS — R627 Adult failure to thrive: Secondary | ICD-10-CM

## 2020-01-15 DIAGNOSIS — E43 Unspecified severe protein-calorie malnutrition: Secondary | ICD-10-CM | POA: Diagnosis not present

## 2020-01-15 DIAGNOSIS — I10 Essential (primary) hypertension: Secondary | ICD-10-CM

## 2020-01-15 NOTE — Assessment & Plan Note (Signed)
She denies any tightness there is no wheezing in the chest.

## 2020-01-15 NOTE — Progress Notes (Signed)
Established Patient Office Visit  Subjective:  Patient ID: [name] [name], female    DOB: 1956-01-16  Age: 64 y.o. MRN: [id number]  CC: No chief complaint on file.   HPI  [name] [name] presents for patient complaining of tiredness  Past Medical History:  Diagnosis Date  . Bowel obstruction (HCC)    x1 month ago  . Cancer ([place])    stomach cancer per pt and currently lung ca  . COPD (chronic obstructive pulmonary disease) ([place])   . Essential hypertension 12/02/2019  . Personal history of radiation therapy 2019-2020   lung ca    Past Surgical History:  Procedure Laterality Date  . COLON SURGERY      Family History  Problem Relation Age of Onset  . Lupus Sister   . Heart failure Neg Hx   . Diabetes Neg Hx     Social History   Socioeconomic History  . Marital status: Widowed    Spouse name: Not on file  . Number of children: Not on file  . Years of education: Not on file  . Highest education level: Not on file  Occupational History  . Occupation: on disability  Tobacco Use  . Smoking status: Current Every Day Smoker    Packs/day: 0.25    Types: Cigarettes  . Smokeless tobacco: Never Used  Vaping Use  . Vaping Use: Never used  Substance and Sexual Activity  . Alcohol use: No  . Drug use: No  . Sexual activity: Not on file  Other Topics Concern  . Not on file  Social History Narrative  . Not on file   Social Determinants of Health   Financial Resource Strain: Not on file  Food Insecurity: Not on file  Transportation Needs: Not on file  Physical Activity: Not on file  Stress: Not on file  Social Connections: Not on file  Intimate Partner Violence: Not on file     Current Outpatient Medications:  .  acetaminophen (TYLENOL) 160 MG/5ML solution, Take 5 mLs (160 mg total) by mouth every 6 (six) hours as needed for mild pain., Disp: 120 mL, Rfl: 6 .  albuterol (PROVENTIL) (2.5 MG/3ML) 0.083% nebulizer solution, Take 3 mLs (2.5 mg total) by nebulization  3 (three) times daily as needed for wheezing., Disp: 75 mL, Rfl: 12 .  atorvastatin (LIPITOR) 40 MG tablet, Take 40 mg by mouth daily., Disp: , Rfl:  .  budesonide-formoterol (SYMBICORT) 160-4.5 MCG/ACT inhaler, Inhale 2 puffs into the lungs 2 (two) times daily., Disp: , Rfl:  .  buPROPion (WELLBUTRIN SR) 100 MG 12 hr tablet, Take 1 tablet (100 mg total) by mouth 2 (two) times daily., Disp: 60 tablet, Rfl: 2 .  cholestyramine (QUESTRAN) 4 GM/DOSE powder, TAKE 1 SCOOPFUL(4 GRAMS) MIXED IN 4-8 OUNCES OF LIQUID AND DRINK, DAILY, Disp: 378 g, Rfl: 3 .  ciprofloxacin (CIPRO) 500 MG tablet, Take 1 tablet (500 mg total) by mouth 2 (two) times daily., Disp: 10 tablet, Rfl: 0 .  cycloSPORINE (RESTASIS) 0.05 % ophthalmic emulsion, Place 1 drop into both eyes every 12 (twelve) hours., Disp: , Rfl:  .  feeding supplement, ENSURE ENLIVE, (ENSURE ENLIVE) LIQD, Take 237 mLs by mouth 2 (two) times daily between meals., Disp: 237 mL, Rfl: 12 .  fluticasone (FLONASE) 50 MCG/ACT nasal spray, USE 1 SPRAY INTO EACH NOSTRIL TWICE DAILY, Disp: 16 g, Rfl: 6 .  guaiFENesin-codeine 100-10 MG/5ML syrup, Take 5 mLs by mouth every 6 (six) hours as needed for cough., Disp: 120  mL, Rfl: 0 .  HYDROcodone-acetaminophen (NORCO/VICODIN) 5-325 MG tablet, Take 1 tablet by mouth every 6 (six) hours as needed for moderate pain., Disp: 15 tablet, Rfl: 0 .  lisinopril (PRINIVIL,ZESTRIL) 40 MG tablet, Take 40 mg by mouth daily., Disp: , Rfl:  .  methocarbamol (ROBAXIN) 500 MG tablet, Take 1 tablet (500 mg total) by mouth every 8 (eight) hours as needed for muscle spasms., Disp: 12 tablet, Rfl: 0 .  mirtazapine (REMERON) 15 MG tablet, Take 15 mg by mouth daily., Disp: , Rfl:  .  nicotine (NICODERM CQ - DOSED IN MG/24 HOURS) 14 mg/24hr patch, Place 1 patch (14 mg total) onto the skin daily., Disp: 28 patch, Rfl: 0 .  omeprazole (PRILOSEC) 20 MG capsule, TAKE 1 CAPSULE BY MOUTH TWICE DAILY, Disp: 180 capsule, Rfl: 0 .  ondansetron (ZOFRAN ODT)  4 MG disintegrating tablet, Take 1 tablet (4 mg total) by mouth every 8 (eight) hours as needed for nausea or vomiting., Disp: 20 tablet, Rfl: 0 .  ondansetron (ZOFRAN ODT) 4 MG disintegrating tablet, Take 1 tablet (4 mg total) by mouth every 8 (eight) hours as needed., Disp: 20 tablet, Rfl: 0 .  ondansetron (ZOFRAN) 4 MG tablet, Take 1 tablet (4 mg total) by mouth every 6 (six) hours as needed for nausea., Disp: 20 tablet, Rfl: 0 .  pantoprazole (PROTONIX) 40 MG tablet, Take 1 tablet (40 mg total) by mouth daily., Disp: 30 tablet, Rfl: 1 .  pregabalin (LYRICA) 100 MG capsule, Take 100 mg by mouth 3 (three) times daily. , Disp: , Rfl:  .  pregabalin (LYRICA) 50 MG capsule, TAKE 1 CAPSULE BY MOUTH 3 TIMES DAILY, Disp: 90 capsule, Rfl: 0 .  sertraline (ZOLOFT) 50 MG tablet, TAKE 1 TABLET BY MOUTH TWICE DAILY, Disp: 60 tablet, Rfl: 1 .  Vitamin D, Ergocalciferol, (DRISDOL) 50000 units CAPS capsule, Take 50,000 Units by mouth every 7 (seven) days., Disp: , Rfl:  .  Water For Irrigation, Sterile (FREE WATER) SOLN, Place 60 mLs into feeding tube every 8 (eight) hours., Disp: 60 mL, Rfl: 0  Current Facility-Administered Medications:  .  feeding supplement (ENSURE ENLIVE / ENSURE PLUS) liquid 237 mL, 237 mL, Oral, BID BM, Beckie Salts, FNP   Allergies  Allergen Reactions  . Aspirin Hives and Itching    ROS Review of Systems  Constitutional: Positive for appetite change, diaphoresis and fatigue. Negative for fever.  HENT: Negative.  Negative for congestion, ear pain and postnasal drip.   Eyes: Negative.   Respiratory: Positive for shortness of breath. Negative for cough and chest tightness.   Cardiovascular: Negative.  Negative for leg swelling.  Gastrointestinal: Negative.   Endocrine: Negative.   Genitourinary: Negative.  Negative for dysuria.  Musculoskeletal: Negative.   Skin: Negative.   Allergic/Immunologic: Negative.   Neurological: Positive for dizziness and headaches. Negative for  speech difficulty.  Hematological: Negative.   Psychiatric/Behavioral: Positive for confusion. The patient is nervous/anxious.   All other systems reviewed and are negative.     Objective:    Physical Exam Vitals reviewed.  Constitutional:      Appearance: Normal appearance.  HENT:     Mouth/Throat:     Mouth: Mucous membranes are moist.  Eyes:     Pupils: Pupils are equal, round, and reactive to light.  Neck:     Vascular: No carotid bruit.  Cardiovascular:     Rate and Rhythm: Normal rate and regular rhythm.     Pulses: Normal pulses.  Heart sounds: Normal heart sounds.  Pulmonary:     Effort: Pulmonary effort is normal.     Breath sounds: Normal breath sounds.  Abdominal:     General: Bowel sounds are normal.     Palpations: Abdomen is soft. There is no hepatomegaly, splenomegaly or mass.     Tenderness: There is no abdominal tenderness.     Hernia: No hernia is present.  Musculoskeletal:        General: No tenderness.     Cervical back: Neck supple.     Right lower leg: No edema.     Left lower leg: No edema.  Skin:    Findings: No rash.  Neurological:     Mental Status: She is alert and oriented to person, place, and time.     Motor: No weakness.  Psychiatric:        Mood and Affect: Mood and affect normal.        Behavior: Behavior normal.     BP 120/68   Pulse 82   Ht 5\' 6"  (1.676 m)   Wt 90 lb 4.8 oz (41 kg)   BMI 14.57 kg/m  Wt Readings from Last 3 Encounters:  01/15/20 90 lb 4.8 oz (41 kg)  12/23/19 88 lb 9.6 oz (40.2 kg)  12/02/19 94 lb 9.6 oz (42.9 kg)     Health Maintenance Due  Topic Date Due  . Hepatitis C Screening  Never done  . PAP SMEAR-Modifier  Never done  . COLONOSCOPY  Never done    There are no preventive care reminders to display for this patient.  No results found for: TSH Lab Results  Component Value Date   WBC 6.2 09/04/2019   HGB 14.0 09/04/2019   HCT 43.1 09/04/2019   MCV 104.1 (H) 09/04/2019   PLT 274  09/04/2019   Lab Results  Component Value Date   NA 140 09/04/2019   K 4.8 09/04/2019   CO2 28 09/04/2019   GLUCOSE 98 09/04/2019   BUN 13 09/04/2019   CREATININE 0.63 09/04/2019   BILITOT 0.6 07/02/2019   ALKPHOS 64 07/02/2019   AST 20 07/02/2019   ALT 18 07/02/2019   PROT 7.6 07/02/2019   ALBUMIN 4.5 07/02/2019   CALCIUM 8.9 09/04/2019   ANIONGAP 7 09/04/2019   No results found for: CHOL No results found for: HDL No results found for: [facility] Lab Results  Component Value Date   TRIG 107 05/30/2014   No results found for: CHOLHDL No results found for: HGBA1C    Assessment & Plan:   Problem List Items Addressed This Visit      Cardiovascular and Mediastinum   Ischemic bowel disease ([place])    Patient was advised to drink Ensure every day      Essential hypertension - Primary    - Today, the patient's blood pressure is well managed . - The patient will continue the current treatment regimen.  - I encouraged the patient to eat a low-sodium diet to help control blood pressure. - I encouraged the patient to live an active lifestyle and complete activities that increases heart rate to 85% target heart rate at least 5 times per week for one hour.            Respiratory   COPD (chronic obstructive pulmonary disease) (HCC) (Chronic)    She denies any tightness there is no wheezing in the chest.      Adenocarcinoma of lung [facility])    Patient is under care  of oncologist and radiation oncologist.        Other   Protein-calorie malnutrition, severe ([place])    She will need to drink and sugar boost on a daily basis.      Tobacco abuse counseling    - I instructed the patient to stop smoking and provided them with smoking cessation materials.  - I informed the patient that smoking puts them at increased risk for cancer, COPD, hypertension, and more.  - Informed the patient to seek help if they begin to have trouble breathing, develop chest pain, start to cough up  blood, feel faint, or pass out.        Adult failure to thrive    Patient has a short-bowel syndrome-cause of her failure to thrive.         No orders of the defined types were placed in this encounter.   Follow-up: No follow-ups on file.    Cletis Athens, MD

## 2020-01-15 NOTE — Assessment & Plan Note (Signed)
-   Today, the patient's blood pressure is well managed . - The patient will continue the current treatment regimen.  - I encouraged the patient to eat a low-sodium diet to help control blood pressure. - I encouraged the patient to live an active lifestyle and complete activities that increases heart rate to 85% target heart rate at least 5 times per week for one hour.

## 2020-01-15 NOTE — Assessment & Plan Note (Signed)
Patient was advised to drink Ensure every day

## 2020-01-15 NOTE — Assessment & Plan Note (Signed)
Patient has a short-bowel syndrome-cause of her failure to thrive.

## 2020-01-15 NOTE — Assessment & Plan Note (Signed)
She will need to drink and sugar boost on a daily basis.

## 2020-01-15 NOTE — Assessment & Plan Note (Signed)
-   I instructed the patient to stop smoking and provided them with smoking cessation materials.  - I informed the patient that smoking puts them at increased risk for cancer, COPD, hypertension, and more.  - Informed the patient to seek help if they begin to have trouble breathing, develop chest pain, start to cough up blood, feel faint, or pass out.  

## 2020-01-15 NOTE — Assessment & Plan Note (Signed)
Patient is under care of oncologist and radiation oncologist.

## 2020-01-21 DIAGNOSIS — R6889 Other general symptoms and signs: Secondary | ICD-10-CM | POA: Diagnosis not present

## 2020-01-29 ENCOUNTER — Other Ambulatory Visit: Payer: Self-pay | Admitting: Internal Medicine

## 2020-02-10 ENCOUNTER — Ambulatory Visit: Payer: Medicare HMO | Admitting: Internal Medicine

## 2020-02-12 DIAGNOSIS — F17210 Nicotine dependence, cigarettes, uncomplicated: Secondary | ICD-10-CM | POA: Diagnosis not present

## 2020-02-12 DIAGNOSIS — I6529 Occlusion and stenosis of unspecified carotid artery: Secondary | ICD-10-CM | POA: Diagnosis not present

## 2020-02-12 DIAGNOSIS — Z923 Personal history of irradiation: Secondary | ICD-10-CM | POA: Diagnosis not present

## 2020-02-12 DIAGNOSIS — C3412 Malignant neoplasm of upper lobe, left bronchus or lung: Secondary | ICD-10-CM | POA: Diagnosis not present

## 2020-02-12 DIAGNOSIS — H539 Unspecified visual disturbance: Secondary | ICD-10-CM | POA: Diagnosis not present

## 2020-02-12 DIAGNOSIS — C3411 Malignant neoplasm of upper lobe, right bronchus or lung: Secondary | ICD-10-CM | POA: Diagnosis not present

## 2020-02-12 DIAGNOSIS — R911 Solitary pulmonary nodule: Secondary | ICD-10-CM | POA: Diagnosis not present

## 2020-02-13 ENCOUNTER — Ambulatory Visit (INDEPENDENT_AMBULATORY_CARE_PROVIDER_SITE_OTHER): Payer: Medicare Other | Admitting: Family Medicine

## 2020-02-13 ENCOUNTER — Other Ambulatory Visit: Payer: Self-pay

## 2020-02-13 ENCOUNTER — Encounter: Payer: Self-pay | Admitting: Family Medicine

## 2020-02-13 ENCOUNTER — Other Ambulatory Visit: Payer: Self-pay | Admitting: Family Medicine

## 2020-02-13 VITALS — BP 138/84 | HR 87 | Ht 65.0 in | Wt 83.7 lb

## 2020-02-13 DIAGNOSIS — E440 Moderate protein-calorie malnutrition: Secondary | ICD-10-CM | POA: Diagnosis not present

## 2020-02-13 DIAGNOSIS — E43 Unspecified severe protein-calorie malnutrition: Secondary | ICD-10-CM | POA: Diagnosis not present

## 2020-02-13 DIAGNOSIS — E876 Hypokalemia: Secondary | ICD-10-CM

## 2020-02-13 DIAGNOSIS — Z716 Tobacco abuse counseling: Secondary | ICD-10-CM

## 2020-02-13 DIAGNOSIS — H25013 Cortical age-related cataract, bilateral: Secondary | ICD-10-CM | POA: Diagnosis not present

## 2020-02-13 NOTE — Assessment & Plan Note (Signed)
Patient is down to 1.5 cigs per day and is trying to quit all together she says.

## 2020-02-13 NOTE — Assessment & Plan Note (Addendum)
Patient was seen at [facility] yesterday for CT Abd to eval liver and lungs for neoplasm, says that she is still in remission at this point. Her weight is down 7 lbs since last visit. She is eating 3-4 meals per days fruits and veg but is only eating 3-4 servings of protein per week. Also drinks 3 ensures per day. Plan- Increase protein intake and larger portions.

## 2020-02-13 NOTE — Assessment & Plan Note (Signed)
Patient noticed dramatic change in her vision last year, she does have cataracts in bilat eyes and is trying to get into Optho.

## 2020-02-13 NOTE — Progress Notes (Signed)
Established Patient Office Visit  SUBJECTIVE:  Subjective  Patient ID: [name] [name], female    DOB: December 30, 1955  Age: 65 y.o. MRN: [id number]  CC:  Chief Complaint  Patient presents with  . Hypertension    HPI [name] [name] is a 65 y.o. female presenting today for     Past Medical History:  Diagnosis Date  . Bowel obstruction (HCC)    x1 month ago  . Cancer ([place])    stomach cancer per pt and currently lung ca  . COPD (chronic obstructive pulmonary disease) ([place])   . Essential hypertension 12/02/2019  . Personal history of radiation therapy 2019-2020   lung ca    Past Surgical History:  Procedure Laterality Date  . COLON SURGERY      Family History  Problem Relation Age of Onset  . Lupus Sister   . Heart failure Neg Hx   . Diabetes Neg Hx     Social History   Socioeconomic History  . Marital status: Widowed    Spouse name: Not on file  . Number of children: Not on file  . Years of education: Not on file  . Highest education level: Not on file  Occupational History  . Occupation: on disability  Tobacco Use  . Smoking status: Current Every Day Smoker    Packs/day: 0.25    Types: Cigarettes  . Smokeless tobacco: Never Used  Vaping Use  . Vaping Use: Never used  Substance and Sexual Activity  . Alcohol use: No  . Drug use: No  . Sexual activity: Not on file  Other Topics Concern  . Not on file  Social History Narrative  . Not on file   Social Determinants of Health   Financial Resource Strain: Not on file  Food Insecurity: Not on file  Transportation Needs: Not on file  Physical Activity: Not on file  Stress: Not on file  Social Connections: Not on file  Intimate Partner Violence: Not on file     Current Outpatient Medications:  .  acetaminophen (TYLENOL) 160 MG/5ML solution, Take 5 mLs (160 mg total) by mouth every 6 (six) hours as needed for mild pain., Disp: 120 mL, Rfl: 6 .  albuterol (PROVENTIL) (2.5 MG/3ML) 0.083% nebulizer solution,  Take 3 mLs (2.5 mg total) by nebulization 3 (three) times daily as needed for wheezing., Disp: 75 mL, Rfl: 12 .  atorvastatin (LIPITOR) 40 MG tablet, Take 40 mg by mouth daily., Disp: , Rfl:  .  buPROPion (WELLBUTRIN SR) 100 MG 12 hr tablet, Take 1 tablet (100 mg total) by mouth 2 (two) times daily., Disp: 60 tablet, Rfl: 2 .  cholestyramine (QUESTRAN) 4 GM/DOSE powder, TAKE 1 SCOOPFUL(4 GRAMS) MIXED IN 4-8 OUNCES OF LIQUID AND DRINK, DAILY, Disp: 378 g, Rfl: 3 .  ciprofloxacin (CIPRO) 500 MG tablet, Take 1 tablet (500 mg total) by mouth 2 (two) times daily., Disp: 10 tablet, Rfl: 0 .  cycloSPORINE (RESTASIS) 0.05 % ophthalmic emulsion, Place 1 drop into both eyes every 12 (twelve) hours., Disp: , Rfl:  .  feeding supplement, ENSURE ENLIVE, (ENSURE ENLIVE) LIQD, Take 237 mLs by mouth 2 (two) times daily between meals., Disp: 237 mL, Rfl: 12 .  fluticasone (FLONASE) 50 MCG/ACT nasal spray, USE 1 SPRAY INTO EACH NOSTRIL TWICE DAILY, Disp: 16 g, Rfl: 6 .  guaiFENesin-codeine 100-10 MG/5ML syrup, Take 5 mLs by mouth every 6 (six) hours as needed for cough., Disp: 120 mL, Rfl: 0 .  HYDROcodone-acetaminophen (NORCO/VICODIN) 5-325 MG  tablet, Take 1 tablet by mouth every 6 (six) hours as needed for moderate pain., Disp: 15 tablet, Rfl: 0 .  lisinopril (PRINIVIL,ZESTRIL) 40 MG tablet, Take 40 mg by mouth daily., Disp: , Rfl:  .  methocarbamol (ROBAXIN) 500 MG tablet, Take 1 tablet (500 mg total) by mouth every 8 (eight) hours as needed for muscle spasms., Disp: 12 tablet, Rfl: 0 .  mirtazapine (REMERON) 15 MG tablet, Take 15 mg by mouth daily., Disp: , Rfl:  .  nicotine (NICODERM CQ - DOSED IN MG/24 HOURS) 14 mg/24hr patch, Place 1 patch (14 mg total) onto the skin daily., Disp: 28 patch, Rfl: 0 .  omeprazole (PRILOSEC) 20 MG capsule, TAKE 1 CAPSULE BY MOUTH TWICE DAILY, Disp: 180 capsule, Rfl: 0 .  ondansetron (ZOFRAN ODT) 4 MG disintegrating tablet, Take 1 tablet (4 mg total) by mouth every 8 (eight) hours  as needed for nausea or vomiting., Disp: 20 tablet, Rfl: 0 .  ondansetron (ZOFRAN ODT) 4 MG disintegrating tablet, Take 1 tablet (4 mg total) by mouth every 8 (eight) hours as needed., Disp: 20 tablet, Rfl: 0 .  ondansetron (ZOFRAN) 4 MG tablet, Take 1 tablet (4 mg total) by mouth every 6 (six) hours as needed for nausea., Disp: 20 tablet, Rfl: 0 .  pregabalin (LYRICA) 100 MG capsule, Take 100 mg by mouth 3 (three) times daily. , Disp: , Rfl:  .  pregabalin (LYRICA) 50 MG capsule, TAKE 1 CAPSULE BY MOUTH 3 TIMES DAILY, Disp: 90 capsule, Rfl: 0 .  sertraline (ZOLOFT) 50 MG tablet, TAKE 1 TABLET BY MOUTH TWICE DAILY, Disp: 60 tablet, Rfl: 1 .  SYMBICORT 160-4.5 MCG/ACT inhaler, INHALE 1 PUFF BY MOUTH TWICE DAILY, Disp: 10.2 g, Rfl: 1 .  Vitamin D, Ergocalciferol, (DRISDOL) 50000 units CAPS capsule, Take 50,000 Units by mouth every 7 (seven) days., Disp: , Rfl:  .  Water For Irrigation, Sterile (FREE WATER) SOLN, Place 60 mLs into feeding tube every 8 (eight) hours., Disp: 60 mL, Rfl: 0 .  pantoprazole (PROTONIX) 40 MG tablet, Take 1 tablet (40 mg total) by mouth daily., Disp: 30 tablet, Rfl: 1   Allergies  Allergen Reactions  . Aspirin Hives and Itching    ROS Review of Systems  Constitutional: Negative.   HENT: Negative.   Respiratory: Negative.   Cardiovascular: Negative.   Genitourinary: Negative.   Neurological: Negative.   Psychiatric/Behavioral: Negative.      OBJECTIVE:    Physical Exam Vitals and nursing note reviewed.  HENT:     Head: Normocephalic.     Mouth/Throat:     Mouth: Mucous membranes are moist.  Cardiovascular:     Rate and Rhythm: Normal rate and regular rhythm.  Abdominal:     General: Abdomen is flat.  Skin:    General: Skin is warm.  Neurological:     Mental Status: She is alert.  Psychiatric:        Mood and Affect: Mood normal.     BP 138/84   Pulse 87   Ht 5\' 5"  (1.651 m)   Wt 83 lb 11.2 oz (38 kg)   BMI 13.93 kg/m  Wt Readings from Last  3 Encounters:  02/13/20 83 lb 11.2 oz (38 kg)  01/15/20 90 lb 4.8 oz (41 kg)  12/23/19 88 lb 9.6 oz (40.2 kg)    Health Maintenance Due  Topic Date Due  . Hepatitis C Screening  Never done  . PAP SMEAR-Modifier  Never done  . COLONOSCOPY (Pts 45-49yrs  Insurance coverage will need to be confirmed)  Never done    There are no preventive care reminders to display for this patient.  CBC Latest Ref Rng & Units 09/04/2019 07/02/2019 07/14/2018  WBC 4.0 - 10.5 K/uL 6.2 16.0(H) 9.6  Hemoglobin 12.0 - 15.0 g/dL 14.0 13.8 12.9  Hematocrit 36.0 - 46.0 % 43.1 42.1 39.0  Platelets 150 - 400 K/uL 274 311 295   CMP Latest Ref Rng & Units 09/04/2019 07/02/2019 07/14/2018  Glucose 70 - 99 mg/dL 98 92 100(H)  BUN 8 - 23 mg/dL 13 13 13   Creatinine 0.44 - 1.00 mg/dL 0.63 0.65 0.64  Sodium 135 - 145 mmol/L 140 141 138  Potassium 3.5 - 5.1 mmol/L 4.8 3.6 4.6  Chloride 98 - 111 mmol/L 105 109 108  CO2 22 - 32 mmol/L 28 23 22   Calcium 8.9 - 10.3 mg/dL 8.9 9.0 9.1  Total Protein 6.5 - 8.1 g/dL - 7.6 -  Total Bilirubin 0.3 - 1.2 mg/dL - 0.6 -  Alkaline Phos 38 - 126 U/L - 64 -  AST 15 - 41 U/L - 20 -  ALT 0 - 44 U/L - 18 -    No results found for: TSH Lab Results  Component Value Date   ALBUMIN 4.5 07/02/2019   ANIONGAP 7 09/04/2019   No results found for: CHOL, HDL, LDLCALC, CHOLHDL Lab Results  Component Value Date   TRIG 107 05/30/2014   No results found for: HGBA1C    ASSESSMENT & PLAN:   Problem List Items Addressed This Visit      Other   Protein-calorie malnutrition, severe ([place])    Patient was seen at [facility] yesterday for CT Abd to eval liver and lungs for neoplasm, says that she is still in remission at this point. Her weight is down 7 lbs since last visit. She is eating 3-4 meals per days fruits and veg but is only eating 3-4 servings of protein per week. Also drinks 3 ensures per day. Plan- Increase protein intake and larger portions.       Protein calorie malnutrition ([place]) -  Primary   Tobacco abuse counseling    Patient is down to 1.5 cigs per day and is trying to quit all together she says.       Cortical age-related cataract of both eyes    Patient noticed dramatic change in her vision last year, she does have cataracts in bilat eyes and is trying to get into Optho.          No orders of the defined types were placed in this encounter.     Follow-up: No follow-ups on file.    Beckie Salts, [place] [address] [place],  [address]

## 2020-02-13 NOTE — Assessment & Plan Note (Signed)
Patient with hx of Hypokalemia, has not had CMP in our system in 4 months, with recent weight loss I am doing CMP today.

## 2020-02-14 LAB — COMPLETE METABOLIC PANEL WITH GFR
AG Ratio: 1.9 (calc) (ref 1.0–2.5)
ALT: 14 U/L (ref 6–29)
AST: 19 U/L (ref 10–35)
Albumin: 4 g/dL (ref 3.6–5.1)
Alkaline phosphatase (APISO): 64 U/L (ref 37–153)
BUN: 10 mg/dL (ref 7–25)
CO2: 25 mmol/L (ref 20–32)
Calcium: 8.9 mg/dL (ref 8.6–10.4)
Chloride: 110 mmol/L (ref 98–110)
Creat: 0.61 mg/dL (ref 0.50–0.99)
GFR, Est African American: 113 mL/min/{1.73_m2} (ref >=60)
GFR, Est Non African American: 97 mL/min/{1.73_m2} (ref >=60)
Globulin: 2.1 g/dL (calc) (ref 1.9–3.7)
Glucose, Bld: 98 mg/dL (ref 65–99)
Potassium: 4.5 mmol/L (ref 3.5–5.3)
Sodium: 144 mmol/L (ref 135–146)
Total Bilirubin: 0.4 mg/dL (ref 0.2–1.2)
Total Protein: 6.1 g/dL (ref 6.1–8.1)

## 2020-02-14 LAB — HOUSE ACCOUNT TRACKING

## 2020-02-28 DIAGNOSIS — H1013 Acute atopic conjunctivitis, bilateral: Secondary | ICD-10-CM | POA: Diagnosis not present

## 2020-03-05 ENCOUNTER — Other Ambulatory Visit: Payer: Self-pay | Admitting: Internal Medicine

## 2020-03-06 ENCOUNTER — Emergency Department: Payer: Medicare Other

## 2020-03-06 ENCOUNTER — Other Ambulatory Visit: Payer: Self-pay

## 2020-03-06 ENCOUNTER — Encounter: Payer: Self-pay | Admitting: Family Medicine

## 2020-03-06 ENCOUNTER — Emergency Department
Admission: EM | Admit: 2020-03-06 | Discharge: 2020-03-06 | Disposition: A | Discharge status: Home / Self Care | Payer: Medicare Other | Attending: Student in an Organized Health Care Education/Training Program | Admitting: Student in an Organized Health Care Education/Training Program

## 2020-03-06 ENCOUNTER — Ambulatory Visit (INDEPENDENT_AMBULATORY_CARE_PROVIDER_SITE_OTHER): Payer: Medicare Other | Admitting: Family Medicine

## 2020-03-06 VITALS — BP 138/63 | HR 66 | Ht 65.0 in | Wt 91.5 lb

## 2020-03-06 DIAGNOSIS — R0789 Other chest pain: Secondary | ICD-10-CM | POA: Insufficient documentation

## 2020-03-06 DIAGNOSIS — F17210 Nicotine dependence, cigarettes, uncomplicated: Secondary | ICD-10-CM | POA: Insufficient documentation

## 2020-03-06 DIAGNOSIS — R0600 Dyspnea, unspecified: Secondary | ICD-10-CM | POA: Diagnosis not present

## 2020-03-06 DIAGNOSIS — J449 Chronic obstructive pulmonary disease, unspecified: Secondary | ICD-10-CM | POA: Diagnosis not present

## 2020-03-06 DIAGNOSIS — Z85118 Personal history of other malignant neoplasm of bronchus and lung: Secondary | ICD-10-CM | POA: Insufficient documentation

## 2020-03-06 DIAGNOSIS — I10 Essential (primary) hypertension: Secondary | ICD-10-CM | POA: Insufficient documentation

## 2020-03-06 DIAGNOSIS — Z7951 Long term (current) use of inhaled steroids: Secondary | ICD-10-CM | POA: Insufficient documentation

## 2020-03-06 DIAGNOSIS — M79602 Pain in left arm: Secondary | ICD-10-CM | POA: Insufficient documentation

## 2020-03-06 DIAGNOSIS — C3490 Malignant neoplasm of unspecified part of unspecified bronchus or lung: Secondary | ICD-10-CM | POA: Diagnosis not present

## 2020-03-06 DIAGNOSIS — R079 Chest pain, unspecified: Secondary | ICD-10-CM | POA: Diagnosis not present

## 2020-03-06 DIAGNOSIS — R0602 Shortness of breath: Secondary | ICD-10-CM

## 2020-03-06 DIAGNOSIS — Z85028 Personal history of other malignant neoplasm of stomach: Secondary | ICD-10-CM | POA: Diagnosis not present

## 2020-03-06 DIAGNOSIS — I700 Atherosclerosis of aorta: Secondary | ICD-10-CM | POA: Diagnosis not present

## 2020-03-06 LAB — BASIC METABOLIC PANEL
Anion gap: 10 (ref 5–15)
BUN: 9 mg/dL (ref 8–23)
CO2: 22 mmol/L (ref 22–32)
Calcium: 8.5 mg/dL — ABNORMAL LOW (ref 8.9–10.3)
Chloride: 111 mmol/L (ref 98–111)
Creatinine, Ser: 0.63 mg/dL (ref 0.44–1.00)
GFR, Estimated: >60 mL/min (ref >60)
Glucose, Bld: 109 mg/dL — ABNORMAL HIGH (ref 70–99)
Potassium: 3.3 mmol/L — ABNORMAL LOW (ref 3.5–5.1)
Sodium: 143 mmol/L (ref 135–145)

## 2020-03-06 LAB — CBC
HCT: 34.5 % — ABNORMAL LOW (ref 36.0–46.0)
Hemoglobin: 11.1 g/dL — ABNORMAL LOW (ref 12.0–15.0)
MCH: 33.3 pg (ref 26.0–34.0)
MCHC: 32.2 g/dL (ref 30.0–36.0)
MCV: 103.6 fL — ABNORMAL HIGH (ref 80.0–100.0)
Platelets: 248 10*3/uL (ref 150–400)
RBC: 3.33 MIL/uL — ABNORMAL LOW (ref 3.87–5.11)
RDW: 11.9 % (ref 11.5–15.5)
WBC: 10.2 10*3/uL (ref 4.0–10.5)
nRBC: 0 % (ref 0.0–0.2)

## 2020-03-06 LAB — TROPONIN I (HIGH SENSITIVITY)
Troponin I (High Sensitivity): 8 ng/L (ref <18)
Troponin I (High Sensitivity): 10 ng/L (ref <18)

## 2020-03-06 MED ORDER — POTASSIUM CHLORIDE CRYS ER 20 MEQ PO TBCR
40.0000 meq | EXTENDED_RELEASE_TABLET | Freq: Once | ORAL | Status: AC
  Administered 2020-03-06: 40 meq via ORAL
  Filled 2020-03-06: qty 2

## 2020-03-06 MED ORDER — ACETAMINOPHEN 325 MG PO TABS
650.0000 mg | ORAL_TABLET | Freq: Once | ORAL | Status: AC
  Administered 2020-03-06: 650 mg via ORAL
  Filled 2020-03-06: qty 2

## 2020-03-06 MED ORDER — OXYCODONE HCL 5 MG PO TABS: 5.0000 mg | ORAL_TABLET | ORAL | Status: DC | PRN

## 2020-03-06 MED ORDER — SODIUM CHLORIDE 0.9 % IV BOLUS
500.0000 mL | Freq: Once | INTRAVENOUS | Status: AC
  Administered 2020-03-06: 500 mL via INTRAVENOUS

## 2020-03-06 MED ORDER — IOHEXOL 350 MG/ML SOLN
75.0000 mL | Freq: Once | INTRAVENOUS | Status: AC | PRN
  Administered 2020-03-06: 75 mL via INTRAVENOUS

## 2020-03-06 NOTE — Progress Notes (Signed)
Established Patient Office Visit  SUBJECTIVE:  Subjective  Patient ID: [name] [name], female    DOB: November 07, 1955  Age: 65 y.o. MRN: [id number]  CC:  Chief Complaint  Patient presents with  . Shortness of Breath    Patient complains of chest pain and shortness of breath.    HPI [name] [name] is a 65 y.o. female presenting today for     Past Medical History:  Diagnosis Date  . Bowel obstruction (HCC)    x1 month ago  . Cancer ([place])    stomach cancer per pt and currently lung ca  . COPD (chronic obstructive pulmonary disease) ([place])   . Essential hypertension 12/02/2019  . Personal history of radiation therapy 2019-2020   lung ca    Past Surgical History:  Procedure Laterality Date  . COLON SURGERY      Family History  Problem Relation Age of Onset  . Lupus Sister   . Heart failure Neg Hx   . Diabetes Neg Hx     Social History   Socioeconomic History  . Marital status: Widowed    Spouse name: Not on file  . Number of children: Not on file  . Years of education: Not on file  . Highest education level: Not on file  Occupational History  . Occupation: on disability  Tobacco Use  . Smoking status: Current Every Day Smoker    Packs/day: 0.25    Types: Cigarettes  . Smokeless tobacco: Never Used  Vaping Use  . Vaping Use: Never used  Substance and Sexual Activity  . Alcohol use: No  . Drug use: No  . Sexual activity: Not on file  Other Topics Concern  . Not on file  Social History Narrative  . Not on file   Social Determinants of Health   Financial Resource Strain: Not on file  Food Insecurity: Not on file  Transportation Needs: Not on file  Physical Activity: Not on file  Stress: Not on file  Social Connections: Not on file  Intimate Partner Violence: Not on file     Current Outpatient Medications:  .  acetaminophen (TYLENOL) 160 MG/5ML solution, Take 5 mLs (160 mg total) by mouth every 6 (six) hours as needed for mild pain., Disp: 120 mL, Rfl:  6 .  albuterol (PROVENTIL) (2.5 MG/3ML) 0.083% nebulizer solution, Take 3 mLs (2.5 mg total) by nebulization 3 (three) times daily as needed for wheezing., Disp: 75 mL, Rfl: 12 .  atorvastatin (LIPITOR) 40 MG tablet, Take 40 mg by mouth daily., Disp: , Rfl:  .  buPROPion (WELLBUTRIN SR) 100 MG 12 hr tablet, Take 1 tablet (100 mg total) by mouth 2 (two) times daily., Disp: 60 tablet, Rfl: 2 .  cycloSPORINE (RESTASIS) 0.05 % ophthalmic emulsion, Place 1 drop into both eyes every 12 (twelve) hours., Disp: , Rfl:  .  feeding supplement, ENSURE ENLIVE, (ENSURE ENLIVE) LIQD, Take 237 mLs by mouth 2 (two) times daily between meals., Disp: 237 mL, Rfl: 12 .  fluticasone (FLONASE) 50 MCG/ACT nasal spray, USE 1 SPRAY INTO EACH NOSTRIL TWICE DAILY, Disp: 16 g, Rfl: 6 .  HYDROcodone-acetaminophen (NORCO/VICODIN) 5-325 MG tablet, Take 1 tablet by mouth every 6 (six) hours as needed for moderate pain., Disp: 15 tablet, Rfl: 0 .  lisinopril (PRINIVIL,ZESTRIL) 40 MG tablet, Take 40 mg by mouth daily., Disp: , Rfl:  .  methocarbamol (ROBAXIN) 500 MG tablet, Take 1 tablet (500 mg total) by mouth every 8 (eight) hours as needed for  muscle spasms., Disp: 12 tablet, Rfl: 0 .  mirtazapine (REMERON) 15 MG tablet, Take 15 mg by mouth daily., Disp: , Rfl:  .  omeprazole (PRILOSEC) 20 MG capsule, TAKE 1 CAPSULE BY MOUTH TWICE DAILY, Disp: 180 capsule, Rfl: 0 .  ondansetron (ZOFRAN ODT) 4 MG disintegrating tablet, Take 1 tablet (4 mg total) by mouth every 8 (eight) hours as needed., Disp: 20 tablet, Rfl: 0 .  pregabalin (LYRICA) 100 MG capsule, Take 100 mg by mouth 3 (three) times daily. , Disp: , Rfl:  .  pregabalin (LYRICA) 50 MG capsule, TAKE 1 CAPSULE BY MOUTH 3 TIMES DAILY, Disp: 90 capsule, Rfl: 0 .  sertraline (ZOLOFT) 50 MG tablet, TAKE 1 TABLET BY MOUTH TWICE DAILY, Disp: 60 tablet, Rfl: 1 .  SYMBICORT 160-4.5 MCG/ACT inhaler, INHALE 1 PUFF BY MOUTH TWICE DAILY, Disp: 10.2 g, Rfl: 1 .  Vitamin D, Ergocalciferol,  (DRISDOL) 50000 units CAPS capsule, Take 50,000 Units by mouth every 7 (seven) days., Disp: , Rfl:  .  Water For Irrigation, Sterile (FREE WATER) SOLN, Place 60 mLs into feeding tube every 8 (eight) hours., Disp: 60 mL, Rfl: 0 .  cholestyramine (QUESTRAN) 4 GM/DOSE powder, TAKE 1 SCOOPFUL(4 GRAMS) MIXED IN 4-8 OUNCES OF LIQUID AND DRINK, DAILY, Disp: 378 g, Rfl: 3 .  pantoprazole (PROTONIX) 40 MG tablet, Take 1 tablet (40 mg total) by mouth daily., Disp: 30 tablet, Rfl: 1   Allergies  Allergen Reactions  . Aspirin Hives and Itching    ROS Review of Systems  Constitutional: Positive for activity change and fatigue.  Respiratory: Positive for shortness of breath.   Cardiovascular: Negative.   Gastrointestinal: Negative.   Musculoskeletal: Negative.   Psychiatric/Behavioral: Negative.      OBJECTIVE:    Physical Exam Vitals reviewed.  Pulmonary:     Breath sounds: Examination of the right-upper field reveals decreased breath sounds. Examination of the left-upper field reveals decreased breath sounds. Decreased breath sounds present.  Abdominal:     Palpations: Abdomen is soft.  Musculoskeletal:        General: Normal range of motion.  Skin:    General: Skin is warm.  Neurological:     Mental Status: She is alert.     BP 138/63   Pulse 66   Ht 5\' 5"  (1.651 m)   Wt 91 lb 8 oz (41.5 kg)   BMI 15.23 kg/m  Wt Readings from Last 3 Encounters:  03/25/20 88 lb 4.8 oz (40.1 kg)  03/20/20 94 lb (42.6 kg)  03/06/20 91 lb (41.3 kg)    Health Maintenance Due  Topic Date Due  . Hepatitis C Screening  Never done  . PAP SMEAR-Modifier  Never done  . COLONOSCOPY (Pts 45-17yrs Insurance coverage will need to be confirmed)  Never done  . COVID-19 Vaccine (4 - Booster for Pfizer series) 04/07/2020    There are no preventive care reminders to display for this patient.  CBC Latest Ref Rng & Units 03/20/2020 03/06/2020 09/04/2019  WBC 4.0 - 10.5 K/uL 13.4(H) 10.2 6.2  Hemoglobin 12.0  - 15.0 g/dL 12.1 11.1(L) 14.0  Hematocrit 36.0 - 46.0 % 36.8 34.5(L) 43.1  Platelets 150 - 400 K/uL 284 248 274   CMP Latest Ref Rng & Units 03/20/2020 03/06/2020 02/13/2020  Glucose 70 - 99 mg/dL 90 109(H) 98  BUN 8 - 23 mg/dL 9 9 10   Creatinine 0.44 - 1.00 mg/dL 0.45 0.63 0.61  Sodium 135 - 145 mmol/L 140 143 144  Potassium 3.5 -  5.1 mmol/L 3.6 3.3(L) 4.5  Chloride 98 - 111 mmol/L 112(H) 111 110  CO2 22 - 32 mmol/L 23 22 25   Calcium 8.9 - 10.3 mg/dL 8.5(L) 8.5(L) 8.9  Total Protein 6.5 - 8.1 g/dL 6.6 - 6.1  Total Bilirubin 0.3 - 1.2 mg/dL 0.6 - 0.4  Alkaline Phos 38 - 126 U/L 65 - -  AST 15 - 41 U/L 18 - 19  ALT 0 - 44 U/L 16 - 14    No results found for: TSH Lab Results  Component Value Date   ALBUMIN 3.8 03/20/2020   ANIONGAP 5 03/20/2020   No results found for: CHOL, HDL, LDLCALC, CHOLHDL Lab Results  Component Value Date   TRIG 107 05/30/2014   No results found for: HGBA1C    ASSESSMENT & PLAN:   Problem List Items Addressed This Visit      Other   Chest pain - Primary    Patient with left arm pain, chest tightness and SOB this am. Sudden onset, no change in medication or activity.   Plan- Patient is going to be transported to [facility] ED via POV she will not be driving herself. At time of DC she is stable with CP not radiating at this time.          No orders of the defined types were placed in this encounter.     Follow-up: No follow-ups on file.    Beckie Salts, [place] [address] [place], [place] [address]

## 2020-03-06 NOTE — ED Triage Notes (Signed)
Pt arrives to ed from [facility]. Pt c/o CP and left arm pain starting Monday 2/7, SOB without any improvement. Pain increases when laying flat. C/o SOB. Hx lung cancer 2016. Currently not receiving chemotherapy.  Regular unlabored respiratory rate, with symmetrical chest rise and fall A&O x 4, NAD in triage.

## 2020-03-06 NOTE — ED Provider Notes (Signed)
[facility] Emergency Department Provider Note    Event Date/Time   First MD Initiated Contact with Patient 03/06/20 1444     (approximate)  I have reviewed the triage vital signs and the nursing notes.   HISTORY  Chief Complaint Chest Pain and Arm Pain    HPI [name] [name] is a 65 y.o. female presents to the ER for 3 days of left-sided chest pain radiating to her left arm pain is worsened with movement.  Does have worsening pain with deep inspiration.  Not on any blood thinners.  No diaphoresis.  No fevers.  Has had some nausea.  No lower extremity swelling.  No cough or congestion.  Denies any abdominal pain.    Past Medical History:  Diagnosis Date  . Bowel obstruction (HCC)    x1 month ago  . Cancer ([place])    stomach cancer per pt and currently lung ca  . COPD (chronic obstructive pulmonary disease) ([place])   . Essential hypertension 12/02/2019  . Personal history of radiation therapy 2019-2020   lung ca   Family History  Problem Relation Age of Onset  . Lupus Sister   . Heart failure Neg Hx   . Diabetes Neg Hx    Past Surgical History:  Procedure Laterality Date  . COLON SURGERY     Patient Active Problem List   Diagnosis Date Noted  . Cortical age-related cataract of both eyes 02/13/2020  . Adenocarcinoma of lung ([place]) 12/02/2019  . Essential hypertension 12/02/2019  . Adult failure to thrive 09/17/2019  . Cardiomyopathy ([place]) 09/17/2019  . E. coli gastroenteritis 09/09/2016  . Protein calorie malnutrition ([place]) 09/09/2016  . Tobacco abuse counseling 09/09/2016  . Hypokalemia 09/08/2016  . Ischemic bowel disease ([place]) 06/14/2014  . Intestinal angina ([place]) 06/14/2014  . COPD (chronic obstructive pulmonary disease) ([place]) 06/14/2014  . Protein-calorie malnutrition, severe ([place]) 05/27/2014  . Abdominal pain 05/26/2014      Prior to Admission medications   Medication Sig Start Date End Date Taking? Authorizing Provider   acetaminophen (TYLENOL) 160 MG/5ML solution Take 5 mLs (160 mg total) by mouth every 6 (six) hours as needed for mild pain. 09/17/19   Cletis Athens, MD  albuterol (PROVENTIL) (2.5 MG/3ML) 0.083% nebulizer solution Take 3 mLs (2.5 mg total) by nebulization 3 (three) times daily as needed for wheezing. 09/09/16   Theodoro Grist, MD  atorvastatin (LIPITOR) 40 MG tablet Take 40 mg by mouth daily.    [provider]  buPROPion (WELLBUTRIN SR) 100 MG 12 hr tablet Take 1 tablet (100 mg total) by mouth 2 (two) times daily. 09/17/19   Cletis Athens, MD  cholestyramine Lucrezia Starch) 4 GM/DOSE powder TAKE 1 SCOOPFUL(4 GRAMS) MIXED IN 4-8 OUNCES OF LIQUID AND DRINK, DAILY 06/27/19   Cletis Athens, MD  cycloSPORINE (RESTASIS) 0.05 % ophthalmic emulsion Place 1 drop into both eyes every 12 (twelve) hours.    [provider]  feeding supplement, ENSURE ENLIVE, (ENSURE ENLIVE) LIQD Take 237 mLs by mouth 2 (two) times daily between meals. 09/10/16   Theodoro Grist, MD  fluticasone (FLONASE) 50 MCG/ACT nasal spray USE 1 SPRAY INTO EACH NOSTRIL TWICE DAILY 12/09/19   Cletis Athens, MD  HYDROcodone-acetaminophen (NORCO/VICODIN) 5-325 MG tablet Take 1 tablet by mouth every 6 (six) hours as needed for moderate pain. 08/30/19   Johnn Hai, PA-C  lisinopril (PRINIVIL,ZESTRIL) 40 MG tablet Take 40 mg by mouth daily.    [provider]  methocarbamol (ROBAXIN) 500 MG tablet  Take 1 tablet (500 mg total) by mouth every 8 (eight) hours as needed for muscle spasms. 08/30/19   Johnn Hai, PA-C  mirtazapine (REMERON) 15 MG tablet Take 15 mg by mouth daily.    [provider]  omeprazole (PRILOSEC) 20 MG capsule TAKE 1 CAPSULE BY MOUTH TWICE DAILY 01/30/20   Cletis Athens, MD  ondansetron (ZOFRAN ODT) 4 MG disintegrating tablet Take 1 tablet (4 mg total) by mouth every 8 (eight) hours as needed. 07/03/19   Rudene Re, MD  pantoprazole (PROTONIX) 40 MG tablet Take 1 tablet (40 mg total) by  mouth daily. 09/09/16 09/09/17  Theodoro Grist, MD  pregabalin (LYRICA) 100 MG capsule Take 100 mg by mouth 3 (three) times daily.     [provider]  pregabalin (LYRICA) 50 MG capsule TAKE 1 CAPSULE BY MOUTH 3 TIMES DAILY 06/27/19   Cletis Athens, MD  sertraline (ZOLOFT) 50 MG tablet TAKE 1 TABLET BY MOUTH TWICE DAILY 03/05/20   Beckie Salts, FNP  SYMBICORT 160-4.5 MCG/ACT inhaler INHALE 1 PUFF BY MOUTH TWICE DAILY 03/05/20   Beckie Salts, FNP  Vitamin D, Ergocalciferol, (DRISDOL) 50000 units CAPS capsule Take 50,000 Units by mouth every 7 (seven) days.    [provider]  Water For Irrigation, Sterile (FREE WATER) SOLN Place 60 mLs into feeding tube every 8 (eight) hours. 05/29/14   Fritzi Mandes, MD    Allergies Aspirin    Social History Social History   Tobacco Use  . Smoking status: Current Every Day Smoker    Packs/day: 0.25    Types: Cigarettes  . Smokeless tobacco: Never Used  Vaping Use  . Vaping Use: Never used  Substance Use Topics  . Alcohol use: No  . Drug use: No    Review of Systems Patient denies headaches, rhinorrhea, blurry vision, numbness, shortness of breath, chest pain, edema, cough, abdominal pain, nausea, vomiting, diarrhea, dysuria, fevers, rashes or hallucinations unless otherwise stated above in HPI. ____________________________________________   PHYSICAL EXAM:  VITAL SIGNS: Vitals:   03/06/20 1610 03/06/20 1700  BP: (!) 154/65 (!) 153/61  Pulse: 60 (!) 56  Resp: 14 16  Temp:    SpO2: 100% 100%    Constitutional: Alert and oriented.  Eyes: Conjunctivae are normal.  Head: Atraumatic. Nose: No congestion/rhinnorhea. Mouth/Throat: Mucous membranes are moist.   Neck: No stridor. Painless ROM.  Cardiovascular: Normal rate, regular rhythm. Grossly normal heart sounds.  Good peripheral circulation. Respiratory: Normal respiratory effort.  No retractions. Lungs CTAB. Gastrointestinal: Soft and nontender. No distention. No abdominal  bruits. No CVA tenderness. Genitourinary:  Musculoskeletal: No lower extremity tenderness nor edema.  No joint effusions. Neurologic:  Normal speech and language. No gross focal neurologic deficits are appreciated. No facial droop Skin:  Skin is warm, dry and intact. No rash noted. Psychiatric: Mood and affect are normal. Speech and behavior are normal.  ____________________________________________   LABS (all labs ordered are listed, but only abnormal results are displayed)  Results for orders placed or performed during the hospital encounter of 03/06/20 (from the past 24 hour(s))  Basic metabolic panel     Status: Abnormal   Collection Time: 03/06/20 12:30 PM  Result Value Ref Range   Sodium 143 135 - 145 mmol/L   Potassium 3.3 (L) 3.5 - 5.1 mmol/L   Chloride 111 98 - 111 mmol/L   CO2 22 22 - 32 mmol/L   Glucose, Bld 109 (H) 70 - 99 mg/dL   BUN 9 8 - 23 mg/dL  Creatinine, Ser 0.63 0.44 - 1.00 mg/dL   Calcium 8.5 (L) 8.9 - 10.3 mg/dL   GFR, Estimated >60 >60 mL/min   Anion gap 10 5 - 15  CBC     Status: Abnormal   Collection Time: 03/06/20 12:30 PM  Result Value Ref Range   WBC 10.2 4.0 - 10.5 K/uL   RBC 3.33 (L) 3.87 - 5.11 MIL/uL   Hemoglobin 11.1 (L) 12.0 - 15.0 g/dL   HCT 34.5 (L) 36.0 - 46.0 %   MCV 103.6 (H) 80.0 - 100.0 fL   MCH 33.3 26.0 - 34.0 pg   MCHC 32.2 30.0 - 36.0 g/dL   RDW 11.9 11.5 - 15.5 %   Platelets 248 150 - 400 K/uL   nRBC 0.0 0.0 - 0.2 %  Troponin I (High Sensitivity)     Status: None   Collection Time: 03/06/20 12:30 PM  Result Value Ref Range   Troponin I (High Sensitivity) 8 <18 ng/L  Troponin I (High Sensitivity)     Status: None   Collection Time: 03/06/20  3:57 PM  Result Value Ref Range   Troponin I (High Sensitivity) 10 <18 ng/L   ____________________________________________  EKG My review and personal interpretation at Time: 12:11   Indication: chest pain  Rate: 65  Rhythm: sinus Axis: normal Other: poor r wave progression, no  stemi ____________________________________________  RADIOLOGY  I personally reviewed all radiographic images ordered to evaluate for the above acute complaints and reviewed radiology reports and findings.  These findings were personally discussed with the patient.  Please see medical record for radiology report.  ____________________________________________   PROCEDURES  Procedure(s) performed:  Procedures    Critical Care performed: no ____________________________________________   INITIAL IMPRESSION / ASSESSMENT AND PLAN / ED COURSE  Pertinent labs & imaging results that were available during my care of the patient were reviewed by me and considered in my medical decision making (see chart for details).   DDX: ACS, pericarditis, esophagitis, boerhaaves, pe, dissection, pna, bronchitis, costochondritis   Prentiss Hammett Crean is a 65 y.o. who presents to the ED with agitation as described above.  Patient nontoxic-appearing presentation as described above.  No respiratory symptoms.  Her EKG is without any evidence of acute ischemia.  Initial troponin negative.  Seems to be more musculoskeletal in nature but she is describing pleuritic pain and with her history of cancer will order CT imaging.  Her CTA resulted without any evidence of PE does have evidence of cavitary spiculated masses likely a progression from previous.  She not hypoxic no white count serial enzymes are negative for ischemia.  Does not seem consistent with ACS.  Her pain was completely resolved after Tylenol.  Suspect a component of arthritis.  No feel patient requires hospitalization she is not showing any signs of shortness of breath she is pain-free with reassuring work-up.  We discussed CT findings and patient agrees to contact her oncologist at [facility] for close follow-up.     The patient was evaluated in Emergency Department today for the symptoms described in the history of present illness. He/she was evaluated in the  context of the global COVID-19 pandemic, which necessitated consideration that the patient might be at risk for infection with the SARS-CoV-2 virus that causes COVID-19. Institutional protocols and algorithms that pertain to the evaluation of patients at risk for COVID-19 are in a state of rapid change based on information released by regulatory bodies including the CDC and federal and state organizations. These policies  and algorithms were followed during the patient's care in the ED.  As part of my medical decision making, I reviewed the following data within the [place] notes reviewed and incorporated, Labs reviewed, notes from prior ED visits and [place] Controlled Substance Database   ____________________________________________   FINAL CLINICAL IMPRESSION(S) / ED DIAGNOSES  Final diagnoses:  Atypical chest pain      NEW MEDICATIONS STARTED DURING THIS VISIT:  Discharge Medication List as of 03/06/2020  5:21 PM       Note:  This document was prepared using Dragon voice recognition software and may include unintentional dictation errors.    Merlyn Lot, MD 03/06/20 [phone]

## 2020-03-06 NOTE — Discharge Instructions (Addendum)
Call to follow up with [facility] in the next few days regarding your CT imaging.      IMPRESSION: 1. 2.9 x 2.7 cm partially cavitary spiculated appearing right upper lobe lung mass worrisome for neoplasm. Recommend PET-CT for further evaluation and staging. 2. 13.5 x 13 mm left upper lobe lung lesion may be partially cavitary. Potentially a synchronous neoplasm. 3. Borderline bilateral hilar lymph nodes. 4. Advanced emphysematous changes. 5. Age advanced atherosclerotic calcification of the thoracic aorta and branch vessels including the coronary arteries. 6. Emphysema and aortic atherosclerosis.   Aortic Atherosclerosis (ICD10-I70.0) and Emphysema (ICD10-J43.9).     Electronically Signed   By: Marijo Sanes M.D.   On: 03/06/2020 16:12

## 2020-03-09 ENCOUNTER — Telehealth: Payer: Self-pay

## 2020-03-09 NOTE — Telephone Encounter (Signed)
Transition Care Management Follow-up Telephone Call  Date of discharge and from where: 03/06/20 from [facility].   How have you been since you were released from the hospital? Pt states that she is feeling well and will call her PCP today to schedule a hospital follow up.   Any questions or concerns? No  Items Reviewed:  Did the pt receive and understand the discharge instructions provided? Yes   Medications obtained and verified? Yes   Other? No   Any new allergies since your discharge? No   Dietary orders reviewed? Heart Healthy  Do you have support at home? Yes   Functional Questionnaire: (I = Independent and D = Dependent) ADLs: I  Bathing/Dressing- I  Meal Prep- I  Eating- I  Maintaining continence- I  Transferring/Ambulation- I  Managing Meds- I  Follow up appointments reviewed:   PCP Hospital f/u appt confirmed? No  Pt stated that she will call to schedule her hospital follow up.  Are transportation arrangements needed? No  If their condition worsens, is the pt aware to call PCP or go to the Emergency Dept.? Yes Was the patient provided with contact information for the PCP's office or ED? Yes Was to pt encouraged to call back with questions or concerns? Yes

## 2020-03-19 ENCOUNTER — Ambulatory Visit: Payer: Medicare Other | Admitting: Family Medicine

## 2020-03-20 ENCOUNTER — Emergency Department: Payer: Medicare Other

## 2020-03-20 ENCOUNTER — Other Ambulatory Visit: Payer: Self-pay

## 2020-03-20 ENCOUNTER — Emergency Department
Admission: EM | Admit: 2020-03-20 | Discharge: 2020-03-20 | Disposition: A | Discharge status: Home / Self Care | Payer: Medicare Other | Attending: Emergency Medicine | Admitting: Emergency Medicine

## 2020-03-20 DIAGNOSIS — Z79899 Other long term (current) drug therapy: Secondary | ICD-10-CM | POA: Insufficient documentation

## 2020-03-20 DIAGNOSIS — M79603 Pain in arm, unspecified: Secondary | ICD-10-CM | POA: Diagnosis not present

## 2020-03-20 DIAGNOSIS — R6889 Other general symptoms and signs: Secondary | ICD-10-CM | POA: Diagnosis not present

## 2020-03-20 DIAGNOSIS — F17210 Nicotine dependence, cigarettes, uncomplicated: Secondary | ICD-10-CM | POA: Diagnosis not present

## 2020-03-20 DIAGNOSIS — M79622 Pain in left upper arm: Secondary | ICD-10-CM | POA: Insufficient documentation

## 2020-03-20 DIAGNOSIS — Z85118 Personal history of other malignant neoplasm of bronchus and lung: Secondary | ICD-10-CM | POA: Insufficient documentation

## 2020-03-20 DIAGNOSIS — Z85028 Personal history of other malignant neoplasm of stomach: Secondary | ICD-10-CM | POA: Diagnosis not present

## 2020-03-20 DIAGNOSIS — I10 Essential (primary) hypertension: Secondary | ICD-10-CM | POA: Diagnosis not present

## 2020-03-20 DIAGNOSIS — R0789 Other chest pain: Secondary | ICD-10-CM | POA: Diagnosis not present

## 2020-03-20 DIAGNOSIS — I499 Cardiac arrhythmia, unspecified: Secondary | ICD-10-CM | POA: Diagnosis not present

## 2020-03-20 DIAGNOSIS — J449 Chronic obstructive pulmonary disease, unspecified: Secondary | ICD-10-CM | POA: Diagnosis not present

## 2020-03-20 DIAGNOSIS — R61 Generalized hyperhidrosis: Secondary | ICD-10-CM | POA: Diagnosis not present

## 2020-03-20 DIAGNOSIS — R079 Chest pain, unspecified: Secondary | ICD-10-CM

## 2020-03-20 DIAGNOSIS — Z743 Need for continuous supervision: Secondary | ICD-10-CM | POA: Diagnosis not present

## 2020-03-20 LAB — COMPREHENSIVE METABOLIC PANEL
ALT: 16 U/L (ref 0–44)
AST: 18 U/L (ref 15–41)
Albumin: 3.8 g/dL (ref 3.5–5.0)
Alkaline Phosphatase: 65 U/L (ref 38–126)
Anion gap: 5 (ref 5–15)
BUN: 9 mg/dL (ref 8–23)
CO2: 23 mmol/L (ref 22–32)
Calcium: 8.5 mg/dL — ABNORMAL LOW (ref 8.9–10.3)
Chloride: 112 mmol/L — ABNORMAL HIGH (ref 98–111)
Creatinine, Ser: 0.45 mg/dL (ref 0.44–1.00)
GFR, Estimated: >60 mL/min (ref >60)
Glucose, Bld: 90 mg/dL (ref 70–99)
Potassium: 3.6 mmol/L (ref 3.5–5.1)
Sodium: 140 mmol/L (ref 135–145)
Total Bilirubin: 0.6 mg/dL (ref 0.3–1.2)
Total Protein: 6.6 g/dL (ref 6.5–8.1)

## 2020-03-20 LAB — CBC WITH DIFFERENTIAL/PLATELET
Abs Immature Granulocytes: 0.05 10*3/uL (ref 0.00–0.07)
Basophils Absolute: 0.1 10*3/uL (ref 0.0–0.1)
Basophils Relative: 0 %
Eosinophils Absolute: 0 10*3/uL (ref 0.0–0.5)
Eosinophils Relative: 0 %
HCT: 36.8 % (ref 36.0–46.0)
Hemoglobin: 12.1 g/dL (ref 12.0–15.0)
Immature Granulocytes: 0 %
Lymphocytes Relative: 11 %
Lymphs Abs: 1.5 10*3/uL (ref 0.7–4.0)
MCH: 34.1 pg — ABNORMAL HIGH (ref 26.0–34.0)
MCHC: 32.9 g/dL (ref 30.0–36.0)
MCV: 103.7 fL — ABNORMAL HIGH (ref 80.0–100.0)
Monocytes Absolute: 0.5 10*3/uL (ref 0.1–1.0)
Monocytes Relative: 4 %
Neutro Abs: 11.2 10*3/uL — ABNORMAL HIGH (ref 1.7–7.7)
Neutrophils Relative %: 85 %
Platelets: 284 10*3/uL (ref 150–400)
RBC: 3.55 MIL/uL — ABNORMAL LOW (ref 3.87–5.11)
RDW: 12.1 % (ref 11.5–15.5)
WBC: 13.4 10*3/uL — ABNORMAL HIGH (ref 4.0–10.5)
nRBC: 0 % (ref 0.0–0.2)

## 2020-03-20 LAB — TROPONIN I (HIGH SENSITIVITY)
Troponin I (High Sensitivity): 10 ng/L (ref <18)
Troponin I (High Sensitivity): 9 ng/L (ref <18)

## 2020-03-20 LAB — D-DIMER, QUANTITATIVE: D-Dimer, Quant: 0.47 ug/mL-FEU (ref 0.00–0.50)

## 2020-03-20 MED ORDER — FENTANYL CITRATE (PF) 100 MCG/2ML IJ SOLN
50.0000 ug | Freq: Once | INTRAMUSCULAR | Status: AC
Start: 2020-03-20 — End: 2020-03-20
  Administered 2020-03-20: 50 ug via INTRAVENOUS
  Filled 2020-03-20: qty 2

## 2020-03-20 MED ORDER — LISINOPRIL 10 MG PO TABS
40.0000 mg | ORAL_TABLET | ORAL | Status: AC
  Administered 2020-03-20: 40 mg via ORAL
  Filled 2020-03-20: qty 4

## 2020-03-20 MED ORDER — METHOCARBAMOL 500 MG PO TABS
500.0000 mg | ORAL_TABLET | ORAL | Status: AC
  Administered 2020-03-20: 500 mg via ORAL
  Filled 2020-03-20 (×2): qty 1

## 2020-03-20 MED ORDER — ACETAMINOPHEN 500 MG PO TABS
1000.0000 mg | ORAL_TABLET | Freq: Once | ORAL | Status: AC
  Administered 2020-03-20: 1000 mg via ORAL
  Filled 2020-03-20: qty 2

## 2020-03-20 MED ORDER — NITROGLYCERIN 0.4 MG SL SUBL
0.4000 mg | SUBLINGUAL_TABLET | SUBLINGUAL | Status: DC | PRN
  Administered 2020-03-20: 0.4 mg via SUBLINGUAL
  Filled 2020-03-20: qty 1

## 2020-03-20 NOTE — ED Triage Notes (Signed)
Pt arrives via ems from home, pt states that she woke up this am having excruciating left arm pain and some left sided chest pain, pt + left radial pulses

## 2020-03-20 NOTE — ED Provider Notes (Signed)
[facility] Emergency Department Provider Note  ____________________________________________   Event Date/Time   First MD Initiated Contact with Patient 03/20/20 1114     (approximate)  I have reviewed the triage vital signs and the nursing notes.   HISTORY  Chief Complaint Chest Pain   HPI [name] [name] is a 65 y.o. female with a past medical history of COPD, HTN, ongoing tobacco abuse and lung cancer status post chemo and radiation at [facility] who presents via EMS from home for assessment of some left-sided chest pain and left upper arm pain that woke her from sleep shortly prior to arrival.  Patient states she had an episode like this when she was seen on 2/11 and seemed to improve on its own.  She states she went to bed last night feeling normal and has not had any other acute symptoms including headache, earache, sore throat, cough, shortness of breath, vomiting, diarrhea, dysuria, rash abdominal pain, back pain or recent falls or injuries.  She has not taken any of her medications this morning has not had anything for her pain.  Aggravating factors include any manipulation of the left upper arm or palpation of her back.  No other acute concerns at this time.           Past Medical History:  Diagnosis Date  . Bowel obstruction (HCC)    x1 month ago  . Cancer ([place])    stomach cancer per pt and currently lung ca  . COPD (chronic obstructive pulmonary disease) ([place])   . Essential hypertension 12/02/2019  . Personal history of radiation therapy 2019-2020   lung ca    Patient Active Problem List   Diagnosis Date Noted  . Cortical age-related cataract of both eyes 02/13/2020  . Adenocarcinoma of lung ([place]) 12/02/2019  . Essential hypertension 12/02/2019  . Adult failure to thrive 09/17/2019  . Cardiomyopathy ([place]) 09/17/2019  . E. coli gastroenteritis 09/09/2016  . Protein calorie malnutrition ([place]) 09/09/2016  . Tobacco abuse counseling 09/09/2016   . Hypokalemia 09/08/2016  . Ischemic bowel disease ([place]) 06/14/2014  . Intestinal angina ([place]) 06/14/2014  . COPD (chronic obstructive pulmonary disease) ([place]) 06/14/2014  . Protein-calorie malnutrition, severe ([place]) 05/27/2014  . Abdominal pain 05/26/2014    Past Surgical History:  Procedure Laterality Date  . COLON SURGERY      Prior to Admission medications   Medication Sig Start Date End Date Taking? Authorizing Provider  acetaminophen (TYLENOL) 160 MG/5ML solution Take 5 mLs (160 mg total) by mouth every 6 (six) hours as needed for mild pain. 09/17/19   Cletis Athens, MD  albuterol (PROVENTIL) (2.5 MG/3ML) 0.083% nebulizer solution Take 3 mLs (2.5 mg total) by nebulization 3 (three) times daily as needed for wheezing. 09/09/16   Theodoro Grist, MD  atorvastatin (LIPITOR) 40 MG tablet Take 40 mg by mouth daily.    [provider]  buPROPion (WELLBUTRIN SR) 100 MG 12 hr tablet Take 1 tablet (100 mg total) by mouth 2 (two) times daily. 09/17/19   Cletis Athens, MD  cholestyramine Lucrezia Starch) 4 GM/DOSE powder TAKE 1 SCOOPFUL(4 GRAMS) MIXED IN 4-8 OUNCES OF LIQUID AND DRINK, DAILY 06/27/19   Cletis Athens, MD  cycloSPORINE (RESTASIS) 0.05 % ophthalmic emulsion Place 1 drop into both eyes every 12 (twelve) hours.    [provider]  feeding supplement, ENSURE ENLIVE, (ENSURE ENLIVE) LIQD Take 237 mLs by mouth 2 (two) times daily between meals. 09/10/16   Theodoro Grist, MD  fluticasone (FLONASE) 50  MCG/ACT nasal spray USE 1 SPRAY INTO EACH NOSTRIL TWICE DAILY 12/09/19   Cletis Athens, MD  HYDROcodone-acetaminophen (NORCO/VICODIN) 5-325 MG tablet Take 1 tablet by mouth every 6 (six) hours as needed for moderate pain. 08/30/19   Johnn Hai, PA-C  lisinopril (PRINIVIL,ZESTRIL) 40 MG tablet Take 40 mg by mouth daily.    [provider]  methocarbamol (ROBAXIN) 500 MG tablet Take 1 tablet (500 mg total) by mouth every 8 (eight) hours as needed for muscle spasms. 08/30/19    Johnn Hai, PA-C  mirtazapine (REMERON) 15 MG tablet Take 15 mg by mouth daily.    [provider]  omeprazole (PRILOSEC) 20 MG capsule TAKE 1 CAPSULE BY MOUTH TWICE DAILY 01/30/20   Cletis Athens, MD  ondansetron (ZOFRAN ODT) 4 MG disintegrating tablet Take 1 tablet (4 mg total) by mouth every 8 (eight) hours as needed. 07/03/19   Rudene Re, MD  pantoprazole (PROTONIX) 40 MG tablet Take 1 tablet (40 mg total) by mouth daily. 09/09/16 09/09/17  Theodoro Grist, MD  pregabalin (LYRICA) 100 MG capsule Take 100 mg by mouth 3 (three) times daily.     [provider]  pregabalin (LYRICA) 50 MG capsule TAKE 1 CAPSULE BY MOUTH 3 TIMES DAILY 06/27/19   Cletis Athens, MD  sertraline (ZOLOFT) 50 MG tablet TAKE 1 TABLET BY MOUTH TWICE DAILY 03/05/20   Beckie Salts, FNP  SYMBICORT 160-4.5 MCG/ACT inhaler INHALE 1 PUFF BY MOUTH TWICE DAILY 03/05/20   Beckie Salts, FNP  Vitamin D, Ergocalciferol, (DRISDOL) 50000 units CAPS capsule Take 50,000 Units by mouth every 7 (seven) days.    [provider]  Water For Irrigation, Sterile (FREE WATER) SOLN Place 60 mLs into feeding tube every 8 (eight) hours. 05/29/14   Fritzi Mandes, MD    Allergies Aspirin  Family History  Problem Relation Age of Onset  . Lupus Sister   . Heart failure Neg Hx   . Diabetes Neg Hx     Social History Social History   Tobacco Use  . Smoking status: Current Every Day Smoker    Packs/day: 0.25    Types: Cigarettes  . Smokeless tobacco: Never Used  Vaping Use  . Vaping Use: Never used  Substance Use Topics  . Alcohol use: No  . Drug use: No    Review of Systems  Review of Systems  Constitutional: Negative for chills and fever.  HENT: Negative for sore throat.   Eyes: Negative for pain.  Respiratory: Negative for cough and stridor.   Cardiovascular: Positive for chest pain.  Gastrointestinal: Negative for vomiting.  Musculoskeletal: Positive for myalgias ( L upper arm ).  Skin:  Negative for rash.  Neurological: Negative for seizures, loss of consciousness and headaches.  Psychiatric/Behavioral: Negative for suicidal ideas.  All other systems reviewed and are negative.     ____________________________________________   PHYSICAL EXAM:  VITAL SIGNS: ED Triage Vitals  Enc Vitals Group     BP      Pulse      Resp      Temp      Temp src      SpO2      Weight      Height      Head Circumference      Peak Flow      Pain Score      Pain Loc      Pain Edu?      Excl. in [place]?    Vitals:  03/20/20 1122  BP: (!) 190/78  Pulse: 79  Resp: 16  Temp: 99.5 F (37.5 C)  SpO2: 99%   Physical Exam Vitals and nursing note reviewed.  Constitutional:      General: She is not in acute distress.    Appearance: She is well-developed and well-nourished.  HENT:     Head: Normocephalic and atraumatic.  Eyes:     Conjunctiva/sclera: Conjunctivae normal.  Cardiovascular:     Rate and Rhythm: Normal rate and regular rhythm.     Heart sounds: No murmur heard.   Pulmonary:     Effort: Pulmonary effort is normal. No respiratory distress.     Breath sounds: Normal breath sounds.  Abdominal:     Palpations: Abdomen is soft.     Tenderness: There is no abdominal tenderness.  Musculoskeletal:        General: No edema.     Cervical back: Neck supple.  Skin:    General: Skin is warm and dry.  Neurological:     Mental Status: She is alert.  Psychiatric:        Mood and Affect: Mood and affect normal.     2+ bilateral radial pulses.  No significant edema or other overlying skin changes about the left upper extremity.  No large joint effusions.  Patient seem to have symmetric strength compared to the right.  She has full range of motion although is tender at the left bicep.  Cranial nerves II through XII grossly intact.  No tenderness step-offs deformities over the C or T-spine. ____________________________________________   LABS (all labs ordered are listed,  but only abnormal results are displayed)  Labs Reviewed  CBC WITH DIFFERENTIAL/PLATELET - Abnormal; Notable for the following components:      Result Value   WBC 13.4 (*)    RBC 3.55 (*)    MCV 103.7 (*)    MCH 34.1 (*)    Neutro Abs 11.2 (*)    All other components within normal limits  COMPREHENSIVE METABOLIC PANEL - Abnormal; Notable for the following components:   Chloride 112 (*)    Calcium 8.5 (*)    All other components within normal limits  D-DIMER, QUANTITATIVE  TROPONIN I (HIGH SENSITIVITY)  TROPONIN I (HIGH SENSITIVITY)   ____________________________________________  EKG  Sinus rhythm with a ventricular of 82, normal axis, unremarkable intervals, nonspecific ST changes in V2 and V3 without other evidence of acute ischemia or other significant arrhythmia. ____________________________________________  RADIOLOGY  ED MD interpretation: Stable bilateral upper lobe nodules without clear acute change.  No focal consolidation, effusion pneumothorax or other clear acute process.  Stable emphysema.  Official radiology report(s): DG Chest 2 View  Result Date: 03/20/2020 CLINICAL DATA:  Chest pain, left arm pain EXAM: CHEST - 2 VIEW COMPARISON:  03/06/2020 FINDINGS: Stable hyperinflation and background COPD/emphysema. Similar bilateral upper lobe spiculated nodular opacities. Please refer to the prior chest CT for further characterization. No superimposed acute pneumonia, collapse or consolidation. Negative for edema, effusion or pneumothorax. Trachea midline. Stable heart size and vascularity. Aorta atherosclerotic. No acute osseous finding. IMPRESSION: Stable bilateral upper lobe spiculated nodules. Please refer to the prior CT. Stable hyperinflation compatible with COPD/emphysema No superimposed acute chest process. Electronically Signed   By: Jerilynn Mages.  Shick M.D.   On: 03/20/2020 12:15    ____________________________________________   PROCEDURES  Procedure(s) performed  (including Critical Care):  .1-3 Lead EKG Interpretation Performed by: Lucrezia Starch, MD Authorized by: Lucrezia Starch, MD  Interpretation: normal     ECG rate assessment: normal     Rhythm: sinus rhythm     Ectopy: none     Conduction: normal       ____________________________________________   INITIAL IMPRESSION / ASSESSMENT AND PLAN / ED COURSE      Patient presents above to history exam consistent with acute onset of left upper arm pain and chest pain.  On arrival patient is hypertensive with a BP of 190/78 with otherwise stable vital signs on room air.  No history or exam findings to suggest acute traumatic injury.  No evidence on exam of acute neurovascular deficit.  No significant edema to suggest DVT or acute SVT syndrome.  Chest x-ray shows no evidence of pneumothorax rib fracture or pneumonia or other clear acute process.  CBC shows mild leukocytosis of unclear etiology at this time with no evidence of acute anemia.  CMP shows no significant electrolyte or metabolic derangements.  Low suspicion for PE given D-dimer of less than 0.5.  ECG shows no clear evidence of ischemia and given nonelevated troponins x2 Evalose patient for ACS or myocarditis.  Patient given below noted analgesia as well as a dose of her home blood pressure medicines.  On reassessment she states she felt a little better and had mild improvement in her blood pressure.  Given stable vitals with otherwise reassuring exam and work-up I believe she is safe for discharge.  Additional differentials include possible cervical radiculopathy from patient's cancer versus other MSK etiologies.  Advised her to follow-up with her PCP.  Discharge stable condition.  Strict return cautions advised and discussed.       ____________________________________________   FINAL CLINICAL IMPRESSION(S) / ED DIAGNOSES  Final diagnoses:  Chest pain, unspecified type    Medications  nitroGLYCERIN (NITROSTAT) SL tablet  0.4 mg (0.4 mg Sublingual Given 03/20/20 1119)  fentaNYL (SUBLIMAZE) injection 50 mcg (50 mcg Intravenous Given 03/20/20 1119)  acetaminophen (TYLENOL) tablet 1,000 mg (1,000 mg Oral Given 03/20/20 1340)  lisinopril (ZESTRIL) tablet 40 mg (40 mg Oral Given 03/20/20 1340)  methocarbamol (ROBAXIN) tablet 500 mg (500 mg Oral Given 03/20/20 1340)     ED Discharge Orders    None       Note:  This document was prepared using Dragon voice recognition software and may include unintentional dictation errors.   Lucrezia Starch, MD 03/20/20 [phone]

## 2020-03-20 NOTE — ED Notes (Signed)
Pt calm , collective , denied sob .

## 2020-03-20 NOTE — ED Notes (Signed)
Pt back from CT

## 2020-03-20 NOTE — ED Notes (Signed)
ED Provider at bedside. 

## 2020-03-23 ENCOUNTER — Telehealth: Payer: Self-pay

## 2020-03-23 DIAGNOSIS — C3490 Malignant neoplasm of unspecified part of unspecified bronchus or lung: Secondary | ICD-10-CM

## 2020-03-23 DIAGNOSIS — J410 Simple chronic bronchitis: Secondary | ICD-10-CM

## 2020-03-23 DIAGNOSIS — E440 Moderate protein-calorie malnutrition: Secondary | ICD-10-CM

## 2020-03-23 DIAGNOSIS — I10 Essential (primary) hypertension: Secondary | ICD-10-CM

## 2020-03-23 NOTE — Telephone Encounter (Signed)
Transition Care Management Unsuccessful Follow-up Telephone Call  Date of discharge and from where:  03/20/2020 from [facility]  Attempts:  1st Attempt  Reason for unsuccessful TCM follow-up call:  Left voice message

## 2020-03-24 NOTE — Telephone Encounter (Addendum)
Transition Care Management Follow-up Telephone Call  Date of discharge and from where: 03/20/2020 from [facility]  How have you been since you were released from the hospital? Pt states that she is still having some chest pain.   Any questions or concerns? No  Items Reviewed:  Did the pt receive and understand the discharge instructions provided? Yes   Medications obtained and verified? Yes   Other? No   Any new allergies since your discharge? No   Dietary orders reviewed? Heart Health and DM Healthy.   Do you have support at home? No   Functional Questionnaire: (I = Independent and D = Dependent) ADLs: D  Bathing/Dressing- D  Meal Prep- D  Eating- I  Maintaining continence- D   Transferring/Ambulation- D pt uses a cane and walker.   Managing Meds- I   Follow up appointments reviewed:   PCP Hospital f/u appt confirmed? Yes  Scheduled to see Cletis Athens, MD on 03/25/2020 @ 10:30am.  [facility] f/u appt confirmed? No   Are transportation arrangements needed? No   If their condition worsens, is the pt aware to call PCP or go to the Emergency Dept.? Yes  Was the patient provided with contact information for the PCP's office or ED? Yes  Was to pt encouraged to call back with questions or concerns? Yes

## 2020-03-25 ENCOUNTER — Ambulatory Visit (INDEPENDENT_AMBULATORY_CARE_PROVIDER_SITE_OTHER): Payer: Medicare Other | Admitting: Internal Medicine

## 2020-03-25 ENCOUNTER — Encounter: Payer: Self-pay | Admitting: Internal Medicine

## 2020-03-25 ENCOUNTER — Other Ambulatory Visit: Payer: Self-pay

## 2020-03-25 VITALS — BP 147/84 | HR 79 | Ht 65.0 in | Wt 88.3 lb

## 2020-03-25 DIAGNOSIS — Z716 Tobacco abuse counseling: Secondary | ICD-10-CM

## 2020-03-25 DIAGNOSIS — K559 Vascular disorder of intestine, unspecified: Secondary | ICD-10-CM

## 2020-03-25 DIAGNOSIS — R627 Adult failure to thrive: Secondary | ICD-10-CM

## 2020-03-25 DIAGNOSIS — M12812 Other specific arthropathies, not elsewhere classified, left shoulder: Secondary | ICD-10-CM

## 2020-03-25 DIAGNOSIS — I10 Essential (primary) hypertension: Secondary | ICD-10-CM | POA: Diagnosis not present

## 2020-03-25 NOTE — Assessment & Plan Note (Signed)
Patient has been taking nutritional supplement therapy from [facility]

## 2020-03-25 NOTE — Assessment & Plan Note (Signed)
Left shoulder was injected with a cortisone shot.  Please see the detailed procedure note.

## 2020-03-25 NOTE — Progress Notes (Signed)
Established Patient Office Visit  Subjective:  Patient ID: [name] [name], female    DOB: 1955/05/11  Age: 65 y.o. MRN: [id number]  CC:  Chief Complaint  Patient presents with  . Follow-up    Patient here today for ED follow up     HPI  [name] [name] presents for pain in the left shoulder.  Also complains of speech problem.  Patient has been seen in the hospital lately.  Her EKG revealed normal sinus rhythm patient has a history of cancer of l stomach and has a chemotherapy for that.  It was done in [place].  She also has a history of tobacco abuse.  She complains of pain in the left shoulder and has had trouble raising left upper arm.  She cannot sleep on the left side.  She denies any history of fever chills earache cough.  Patient is known to have COPD hypertension has a history of radiation therapy in 2020  Past Medical History:  Diagnosis Date  . Bowel obstruction (HCC)    x1 month ago  . Cancer ([place])    stomach cancer per pt and currently lung ca  . COPD (chronic obstructive pulmonary disease) ([place])   . Essential hypertension 12/02/2019  . Personal history of radiation therapy 2019-2020   lung ca    Past Surgical History:  Procedure Laterality Date  . COLON SURGERY      Family History  Problem Relation Age of Onset  . Lupus Sister   . Heart failure Neg Hx   . Diabetes Neg Hx     Social History   Socioeconomic History  . Marital status: Widowed    Spouse name: Not on file  . Number of children: Not on file  . Years of education: Not on file  . Highest education level: Not on file  Occupational History  . Occupation: on disability  Tobacco Use  . Smoking status: Current Every Day Smoker    Packs/day: 0.25    Types: Cigarettes  . Smokeless tobacco: Never Used  Vaping Use  . Vaping Use: Never used  Substance and Sexual Activity  . Alcohol use: No  . Drug use: No  . Sexual activity: Not on file  Other Topics Concern  . Not on file  Social History  Narrative  . Not on file   Social Determinants of Health   Financial Resource Strain: Not on file  Food Insecurity: Not on file  Transportation Needs: Not on file  Physical Activity: Not on file  Stress: Not on file  Social Connections: Not on file  Intimate Partner Violence: Not on file     Current Outpatient Medications:  .  acetaminophen (TYLENOL) 160 MG/5ML solution, Take 5 mLs (160 mg total) by mouth every 6 (six) hours as needed for mild pain., Disp: 120 mL, Rfl: 6 .  albuterol (PROVENTIL) (2.5 MG/3ML) 0.083% nebulizer solution, Take 3 mLs (2.5 mg total) by nebulization 3 (three) times daily as needed for wheezing., Disp: 75 mL, Rfl: 12 .  atorvastatin (LIPITOR) 40 MG tablet, Take 40 mg by mouth daily., Disp: , Rfl:  .  buPROPion (WELLBUTRIN SR) 100 MG 12 hr tablet, Take 1 tablet (100 mg total) by mouth 2 (two) times daily., Disp: 60 tablet, Rfl: 2 .  cholestyramine (QUESTRAN) 4 GM/DOSE powder, TAKE 1 SCOOPFUL(4 GRAMS) MIXED IN 4-8 OUNCES OF LIQUID AND DRINK, DAILY, Disp: 378 g, Rfl: 3 .  cycloSPORINE (RESTASIS) 0.05 % ophthalmic emulsion, Place 1 drop into  both eyes every 12 (twelve) hours., Disp: , Rfl:  .  feeding supplement, ENSURE ENLIVE, (ENSURE ENLIVE) LIQD, Take 237 mLs by mouth 2 (two) times daily between meals., Disp: 237 mL, Rfl: 12 .  fluticasone (FLONASE) 50 MCG/ACT nasal spray, USE 1 SPRAY INTO EACH NOSTRIL TWICE DAILY, Disp: 16 g, Rfl: 6 .  HYDROcodone-acetaminophen (NORCO/VICODIN) 5-325 MG tablet, Take 1 tablet by mouth every 6 (six) hours as needed for moderate pain., Disp: 15 tablet, Rfl: 0 .  lisinopril (PRINIVIL,ZESTRIL) 40 MG tablet, Take 40 mg by mouth daily., Disp: , Rfl:  .  methocarbamol (ROBAXIN) 500 MG tablet, Take 1 tablet (500 mg total) by mouth every 8 (eight) hours as needed for muscle spasms., Disp: 12 tablet, Rfl: 0 .  mirtazapine (REMERON) 15 MG tablet, Take 15 mg by mouth daily., Disp: , Rfl:  .  omeprazole (PRILOSEC) 20 MG capsule, TAKE 1 CAPSULE  BY MOUTH TWICE DAILY, Disp: 180 capsule, Rfl: 0 .  ondansetron (ZOFRAN ODT) 4 MG disintegrating tablet, Take 1 tablet (4 mg total) by mouth every 8 (eight) hours as needed., Disp: 20 tablet, Rfl: 0 .  pregabalin (LYRICA) 100 MG capsule, Take 100 mg by mouth 3 (three) times daily. , Disp: , Rfl:  .  pregabalin (LYRICA) 50 MG capsule, TAKE 1 CAPSULE BY MOUTH 3 TIMES DAILY, Disp: 90 capsule, Rfl: 0 .  sertraline (ZOLOFT) 50 MG tablet, TAKE 1 TABLET BY MOUTH TWICE DAILY, Disp: 60 tablet, Rfl: 1 .  SYMBICORT 160-4.5 MCG/ACT inhaler, INHALE 1 PUFF BY MOUTH TWICE DAILY, Disp: 10.2 g, Rfl: 1 .  Vitamin D, Ergocalciferol, (DRISDOL) 50000 units CAPS capsule, Take 50,000 Units by mouth every 7 (seven) days., Disp: , Rfl:  .  Water For Irrigation, Sterile (FREE WATER) SOLN, Place 60 mLs into feeding tube every 8 (eight) hours., Disp: 60 mL, Rfl: 0 .  pantoprazole (PROTONIX) 40 MG tablet, Take 1 tablet (40 mg total) by mouth daily., Disp: 30 tablet, Rfl: 1   Allergies  Allergen Reactions  . Aspirin Hives and Itching    ROS Review of Systems  Endocrine: Negative for polydipsia.  Genitourinary: Negative for dysuria.  Musculoskeletal: Positive for arthralgias.       Patient has arthritis of the left shoulder and the movement of the left shoulder is painful  Neurological: Positive for speech difficulty and light-headedness. Negative for dizziness, tremors and facial asymmetry.  Hematological: Negative for adenopathy.  Psychiatric/Behavioral: Negative for agitation.      Objective:    Physical Exam Musculoskeletal:        General: No swelling.     Comments: Patient has a painful movement of the left shoulder.  Skin:    General: Skin is warm.      BP (!) 147/84   Pulse 79   Ht 5\' 5"  (1.651 m)   Wt 88 lb 4.8 oz (40.1 kg)   BMI 14.69 kg/m  Wt Readings from Last 3 Encounters:  03/25/20 88 lb 4.8 oz (40.1 kg)  03/20/20 94 lb (42.6 kg)  03/06/20 91 lb (41.3 kg)     Health Maintenance Due   Topic Date Due  . Hepatitis C Screening  Never done  . PAP SMEAR-Modifier  Never done  . COLONOSCOPY (Pts 45-73yrs Insurance coverage will need to be confirmed)  Never done    There are no preventive care reminders to display for this patient.  No results found for: TSH Lab Results  Component Value Date   WBC 13.4 (H) 03/20/2020  HGB 12.1 03/20/2020   HCT 36.8 03/20/2020   MCV 103.7 (H) 03/20/2020   PLT 284 03/20/2020   Lab Results  Component Value Date   NA 140 03/20/2020   K 3.6 03/20/2020   CO2 23 03/20/2020   GLUCOSE 90 03/20/2020   BUN 9 03/20/2020   CREATININE 0.45 03/20/2020   BILITOT 0.6 03/20/2020   ALKPHOS 65 03/20/2020   AST 18 03/20/2020   ALT 16 03/20/2020   PROT 6.6 03/20/2020   ALBUMIN 3.8 03/20/2020   CALCIUM 8.5 (L) 03/20/2020   ANIONGAP 5 03/20/2020   No results found for: CHOL No results found for: HDL No results found for: [facility] Lab Results  Component Value Date   TRIG 107 05/30/2014   No results found for: CHOLHDL No results found for: HGBA1C    Assessment & Plan:   Problem List Items Addressed This Visit      Cardiovascular and Mediastinum   Ischemic bowel disease ([place])    Patient was advised to follow-up in [place] hypertension - Primary    Patient blood pressure is normal patient denies any chest pain or shortness of breath there is no history of palpitation paroxysmal nocturnal dyspnea, patient can walk  With help  Of walker        Musculoskeletal and Integument   Rotator cuff arthropathy of left shoulder    Left shoulder was injected with a cortisone shot.  Please see the detailed procedure note.        Other   Tobacco abuse counseling    - I instructed the patient to stop smoking and provided them with smoking cessation materials.  - I informed the patient that smoking puts them at increased risk for cancer, COPD, hypertension, and more.  - Informed the patient to seek help if they begin to have  trouble breathing, develop chest pain, start to cough up blood, feel faint, or pass out.        Adult failure to thrive    Patient has been taking nutritional supplement therapy from [place] Injection/Arthrocentesis  Date/Time: 03/25/2020 11:44 AM Performed by: Cletis Athens, MD Authorized by: Cletis Athens, MD  Indications: joint swelling and pain  Body area: shoulder Joint: left subacromial bursa Local anesthesia used: yes  Anesthesia: Local anesthesia used: yes Local Anesthetic: lidocaine 1% without epinephrine Needle size: 18 G Ultrasound guidance: no Approach: posterior Triamcinolone amount: 40 mg Lidocaine 1% amount: 1 mL Comments: Left shoulder was prepared with alcohol.  And 1 cc of lidocaine was injected in the left subacromial fossa, next 40 mg of Kenalog was injected in the subacromial space.  Patient tolerated the procedure well      Follow-up: No follow-ups on file.    Cletis Athens, MD

## 2020-03-25 NOTE — Assessment & Plan Note (Signed)
-   I instructed the patient to stop smoking and provided them with smoking cessation materials.  - I informed the patient that smoking puts them at increased risk for cancer, COPD, hypertension, and more.  - Informed the patient to seek help if they begin to have trouble breathing, develop chest pain, start to cough up blood, feel faint, or pass out.  

## 2020-03-25 NOTE — Assessment & Plan Note (Signed)
Patient blood pressure is normal patient denies any chest pain or shortness of breath there is no history of palpitation paroxysmal nocturnal dyspnea, patient can walk  With help  Of walker

## 2020-03-25 NOTE — Assessment & Plan Note (Signed)
Patient was advised to follow-up in [facility]

## 2020-04-01 ENCOUNTER — Other Ambulatory Visit: Payer: Self-pay | Admitting: Internal Medicine

## 2020-04-08 DIAGNOSIS — R079 Chest pain, unspecified: Secondary | ICD-10-CM | POA: Insufficient documentation

## 2020-04-08 DIAGNOSIS — R071 Chest pain on breathing: Secondary | ICD-10-CM | POA: Insufficient documentation

## 2020-04-08 NOTE — Assessment & Plan Note (Signed)
Patient with left arm pain, chest tightness and SOB this am. Sudden onset, no change in medication or activity.   Plan- Patient is going to be transported to [facility] ED via POV she will not be driving herself. At time of DC she is stable with CP not radiating at this time.

## 2020-04-09 ENCOUNTER — Ambulatory Visit (INDEPENDENT_AMBULATORY_CARE_PROVIDER_SITE_OTHER): Payer: Medicare Other | Admitting: Family Medicine

## 2020-04-09 ENCOUNTER — Ambulatory Visit: Payer: Medicare Other

## 2020-04-09 ENCOUNTER — Encounter: Payer: Self-pay | Admitting: Emergency Medicine

## 2020-04-09 ENCOUNTER — Emergency Department: Payer: Medicare Other

## 2020-04-09 ENCOUNTER — Other Ambulatory Visit: Payer: Self-pay

## 2020-04-09 ENCOUNTER — Emergency Department
Admission: EM | Admit: 2020-04-09 | Discharge: 2020-04-09 | Disposition: A | Discharge status: Home / Self Care | Payer: Medicare Other | Attending: Student in an Organized Health Care Education/Training Program | Admitting: Student in an Organized Health Care Education/Training Program

## 2020-04-09 ENCOUNTER — Encounter: Payer: Self-pay | Admitting: Family Medicine

## 2020-04-09 VITALS — BP 143/69 | HR 58 | Ht 65.0 in | Wt 85.5 lb

## 2020-04-09 DIAGNOSIS — E860 Dehydration: Secondary | ICD-10-CM | POA: Insufficient documentation

## 2020-04-09 DIAGNOSIS — R1013 Epigastric pain: Secondary | ICD-10-CM | POA: Diagnosis not present

## 2020-04-09 DIAGNOSIS — Z79899 Other long term (current) drug therapy: Secondary | ICD-10-CM | POA: Diagnosis not present

## 2020-04-09 DIAGNOSIS — R112 Nausea with vomiting, unspecified: Secondary | ICD-10-CM | POA: Diagnosis not present

## 2020-04-09 DIAGNOSIS — J449 Chronic obstructive pulmonary disease, unspecified: Secondary | ICD-10-CM | POA: Diagnosis not present

## 2020-04-09 DIAGNOSIS — Z85038 Personal history of other malignant neoplasm of large intestine: Secondary | ICD-10-CM | POA: Diagnosis not present

## 2020-04-09 DIAGNOSIS — R9389 Abnormal findings on diagnostic imaging of other specified body structures: Secondary | ICD-10-CM | POA: Diagnosis not present

## 2020-04-09 DIAGNOSIS — R1031 Right lower quadrant pain: Secondary | ICD-10-CM | POA: Diagnosis not present

## 2020-04-09 DIAGNOSIS — Z85118 Personal history of other malignant neoplasm of bronchus and lung: Secondary | ICD-10-CM | POA: Diagnosis not present

## 2020-04-09 DIAGNOSIS — I10 Essential (primary) hypertension: Secondary | ICD-10-CM | POA: Insufficient documentation

## 2020-04-09 DIAGNOSIS — R1084 Generalized abdominal pain: Secondary | ICD-10-CM

## 2020-04-09 DIAGNOSIS — R1110 Vomiting, unspecified: Secondary | ICD-10-CM | POA: Diagnosis not present

## 2020-04-09 DIAGNOSIS — R109 Unspecified abdominal pain: Secondary | ICD-10-CM

## 2020-04-09 DIAGNOSIS — F17210 Nicotine dependence, cigarettes, uncomplicated: Secondary | ICD-10-CM | POA: Diagnosis not present

## 2020-04-09 DIAGNOSIS — R1033 Periumbilical pain: Secondary | ICD-10-CM | POA: Insufficient documentation

## 2020-04-09 LAB — CBC
HCT: 41 % (ref 36.0–46.0)
Hemoglobin: 13.5 g/dL (ref 12.0–15.0)
MCH: 34 pg (ref 26.0–34.0)
MCHC: 32.9 g/dL (ref 30.0–36.0)
MCV: 103.3 fL — ABNORMAL HIGH (ref 80.0–100.0)
Platelets: 261 10*3/uL (ref 150–400)
RBC: 3.97 MIL/uL (ref 3.87–5.11)
RDW: 11.8 % (ref 11.5–15.5)
WBC: 10.4 10*3/uL (ref 4.0–10.5)
nRBC: 0 % (ref 0.0–0.2)

## 2020-04-09 LAB — COMPREHENSIVE METABOLIC PANEL
ALT: 23 U/L (ref 0–44)
AST: 20 U/L (ref 15–41)
Albumin: 4 g/dL (ref 3.5–5.0)
Alkaline Phosphatase: 62 U/L (ref 38–126)
Anion gap: 6 (ref 5–15)
BUN: 11 mg/dL (ref 8–23)
CO2: 23 mmol/L (ref 22–32)
Calcium: 8.8 mg/dL — ABNORMAL LOW (ref 8.9–10.3)
Chloride: 112 mmol/L — ABNORMAL HIGH (ref 98–111)
Creatinine, Ser: 0.64 mg/dL (ref 0.44–1.00)
GFR, Estimated: >60 mL/min (ref >60)
Glucose, Bld: 73 mg/dL (ref 70–99)
Potassium: 3.8 mmol/L (ref 3.5–5.1)
Sodium: 141 mmol/L (ref 135–145)
Total Bilirubin: 0.7 mg/dL (ref 0.3–1.2)
Total Protein: 6.8 g/dL (ref 6.5–8.1)

## 2020-04-09 LAB — LIPASE, BLOOD: Lipase: 30 U/L (ref 11–51)

## 2020-04-09 MED ORDER — IOHEXOL 300 MG/ML  SOLN
60.0000 mL | Freq: Once | INTRAMUSCULAR | Status: AC | PRN
  Administered 2020-04-09: 60 mL via INTRAVENOUS

## 2020-04-09 MED ORDER — FENTANYL CITRATE (PF) 100 MCG/2ML IJ SOLN
50.0000 ug | INTRAMUSCULAR | Status: DC | PRN
  Administered 2020-04-09: 50 ug via INTRAVENOUS
  Filled 2020-04-09: qty 2

## 2020-04-09 MED ORDER — SODIUM CHLORIDE 0.9 % IV BOLUS
250.0000 mL | Freq: Once | INTRAVENOUS | Status: AC
  Administered 2020-04-09: 250 mL via INTRAVENOUS

## 2020-04-09 NOTE — Progress Notes (Signed)
Established Patient Office Visit  SUBJECTIVE:  Subjective  Patient ID: [name] [name], female    DOB: 1955-04-04  Age: 65 y.o. MRN: [id number]  CC:  Chief Complaint  Patient presents with  . Abdominal Pain    Patient having abdominal pain with diarrhea and vomiting x 2 weeks     HPI [name] [name] is a 65 y.o. female presenting today for     Past Medical History:  Diagnosis Date  . Bowel obstruction (HCC)    x1 month ago  . Cancer ([place])    stomach cancer per pt and currently lung ca  . COPD (chronic obstructive pulmonary disease) ([place])   . Essential hypertension 12/02/2019  . Personal history of radiation therapy 2019-2020   lung ca    Past Surgical History:  Procedure Laterality Date  . COLON SURGERY      Family History  Problem Relation Age of Onset  . Lupus Sister   . Heart failure Neg Hx   . Diabetes Neg Hx     Social History   Socioeconomic History  . Marital status: Widowed    Spouse name: Not on file  . Number of children: Not on file  . Years of education: Not on file  . Highest education level: Not on file  Occupational History  . Occupation: on disability  Tobacco Use  . Smoking status: Current Every Day Smoker    Packs/day: 0.25    Types: Cigarettes  . Smokeless tobacco: Never Used  Vaping Use  . Vaping Use: Never used  Substance and Sexual Activity  . Alcohol use: No  . Drug use: No  . Sexual activity: Not on file  Other Topics Concern  . Not on file  Social History Narrative  . Not on file   Social Determinants of Health   Financial Resource Strain: Not on file  Food Insecurity: Not on file  Transportation Needs: Not on file  Physical Activity: Not on file  Stress: Not on file  Social Connections: Not on file  Intimate Partner Violence: Not on file     Current Outpatient Medications:  .  acetaminophen (TYLENOL) 160 MG/5ML solution, Take 5 mLs (160 mg total) by mouth every 6 (six) hours as needed for mild pain., Disp: 120  mL, Rfl: 6 .  albuterol (PROVENTIL) (2.5 MG/3ML) 0.083% nebulizer solution, Take 3 mLs (2.5 mg total) by nebulization 3 (three) times daily as needed for wheezing., Disp: 75 mL, Rfl: 12 .  atorvastatin (LIPITOR) 40 MG tablet, Take 40 mg by mouth daily., Disp: , Rfl:  .  buPROPion (WELLBUTRIN SR) 100 MG 12 hr tablet, Take 1 tablet (100 mg total) by mouth 2 (two) times daily., Disp: 60 tablet, Rfl: 2 .  cholestyramine (QUESTRAN) 4 GM/DOSE powder, TAKE 1 SCOOPFUL(4 GRAMS) MIXED IN 4-8 OUNCES OF LIQUID AND DRINK, DAILY, Disp: 378 g, Rfl: 3 .  cycloSPORINE (RESTASIS) 0.05 % ophthalmic emulsion, Place 1 drop into both eyes every 12 (twelve) hours., Disp: , Rfl:  .  feeding supplement, ENSURE ENLIVE, (ENSURE ENLIVE) LIQD, Take 237 mLs by mouth 2 (two) times daily between meals., Disp: 237 mL, Rfl: 12 .  fluticasone (FLONASE) 50 MCG/ACT nasal spray, USE 1 SPRAY INTO EACH NOSTRIL TWICE DAILY, Disp: 16 g, Rfl: 6 .  HYDROcodone-acetaminophen (NORCO/VICODIN) 5-325 MG tablet, Take 1 tablet by mouth every 6 (six) hours as needed for moderate pain., Disp: 15 tablet, Rfl: 0 .  lisinopril (PRINIVIL,ZESTRIL) 40 MG tablet, Take 40 mg by mouth  daily., Disp: , Rfl:  .  methocarbamol (ROBAXIN) 500 MG tablet, Take 1 tablet (500 mg total) by mouth every 8 (eight) hours as needed for muscle spasms., Disp: 12 tablet, Rfl: 0 .  mirtazapine (REMERON) 15 MG tablet, Take 15 mg by mouth daily., Disp: , Rfl:  .  omeprazole (PRILOSEC) 20 MG capsule, TAKE 1 CAPSULE BY MOUTH TWICE DAILY, Disp: 180 capsule, Rfl: 0 .  ondansetron (ZOFRAN ODT) 4 MG disintegrating tablet, Take 1 tablet (4 mg total) by mouth every 8 (eight) hours as needed., Disp: 20 tablet, Rfl: 0 .  pregabalin (LYRICA) 100 MG capsule, Take 100 mg by mouth 3 (three) times daily. , Disp: , Rfl:  .  pregabalin (LYRICA) 50 MG capsule, TAKE 1 CAPSULE BY MOUTH 3 TIMES DAILY, Disp: 90 capsule, Rfl: 0 .  sertraline (ZOLOFT) 50 MG tablet, TAKE 1 TABLET BY MOUTH TWICE DAILY, Disp:  60 tablet, Rfl: 1 .  SYMBICORT 160-4.5 MCG/ACT inhaler, INHALE 1 PUFF BY MOUTH TWICE DAILY, Disp: 10.2 g, Rfl: 1 .  Vitamin D, Ergocalciferol, (DRISDOL) 50000 units CAPS capsule, Take 50,000 Units by mouth every 7 (seven) days., Disp: , Rfl:  .  Water For Irrigation, Sterile (FREE WATER) SOLN, Place 60 mLs into feeding tube every 8 (eight) hours., Disp: 60 mL, Rfl: 0 .  pantoprazole (PROTONIX) 40 MG tablet, Take 1 tablet (40 mg total) by mouth daily., Disp: 30 tablet, Rfl: 1   Allergies  Allergen Reactions  . Aspirin Hives and Itching    ROS Review of Systems  Constitutional: Negative.   HENT: Negative.   Respiratory: Negative.   Gastrointestinal: Positive for abdominal pain, diarrhea, nausea and vomiting.  Genitourinary: Negative.   Musculoskeletal: Negative.   Psychiatric/Behavioral: Negative.      OBJECTIVE:    Physical Exam Vitals and nursing note reviewed.  HENT:     Head: Atraumatic.  Cardiovascular:     Rate and Rhythm: Normal rate and regular rhythm.  Pulmonary:     Effort: Pulmonary effort is normal.  Abdominal:     Tenderness: There is abdominal tenderness in the right lower quadrant. There is guarding and rebound.  Skin:    General: Skin is warm.  Neurological:     Mental Status: She is alert.     BP (!) 143/69   Pulse (!) 58   Ht 5\' 5"  (1.651 m)   Wt 85 lb 8 oz (38.8 kg)   BMI 14.23 kg/m  Wt Readings from Last 3 Encounters:  04/09/20 85 lb 8 oz (38.8 kg)  03/25/20 88 lb 4.8 oz (40.1 kg)  03/20/20 94 lb (42.6 kg)    Health Maintenance Due  Topic Date Due  . Hepatitis C Screening  Never done  . PAP SMEAR-Modifier  Never done  . COLONOSCOPY (Pts 45-5yrs Insurance coverage will need to be confirmed)  Never done  . COVID-19 Vaccine (4 - Booster for Pfizer series) 04/07/2020    There are no preventive care reminders to display for this patient.  CBC Latest Ref Rng & Units 03/20/2020 03/06/2020 09/04/2019  WBC 4.0 - 10.5 K/uL 13.4(H) 10.2 6.2   Hemoglobin 12.0 - 15.0 g/dL 12.1 11.1(L) 14.0  Hematocrit 36.0 - 46.0 % 36.8 34.5(L) 43.1  Platelets 150 - 400 K/uL 284 248 274   CMP Latest Ref Rng & Units 03/20/2020 03/06/2020 02/13/2020  Glucose 70 - 99 mg/dL 90 109(H) 98  BUN 8 - 23 mg/dL 9 9 10   Creatinine 0.44 - 1.00 mg/dL 0.45 0.63 0.61  Sodium 135 - 145 mmol/L 140 143 144  Potassium 3.5 - 5.1 mmol/L 3.6 3.3(L) 4.5  Chloride 98 - 111 mmol/L 112(H) 111 110  CO2 22 - 32 mmol/L 23 22 25   Calcium 8.9 - 10.3 mg/dL 8.5(L) 8.5(L) 8.9  Total Protein 6.5 - 8.1 g/dL 6.6 - 6.1  Total Bilirubin 0.3 - 1.2 mg/dL 0.6 - 0.4  Alkaline Phos 38 - 126 U/L 65 - -  AST 15 - 41 U/L 18 - 19  ALT 0 - 44 U/L 16 - 14    No results found for: TSH Lab Results  Component Value Date   ALBUMIN 3.8 03/20/2020   ANIONGAP 5 03/20/2020   No results found for: CHOL, HDL, LDLCALC, CHOLHDL Lab Results  Component Value Date   TRIG 107 05/30/2014   No results found for: HGBA1C    ASSESSMENT & PLAN:   Problem List Items Addressed This Visit      Digestive   Intractable vomiting - Primary    Patient with NVD x 2 weeks, says that she is having trouble even hold water down. Pain is worse on RLQ. Says that she has night sweats as well.   Plan- Unable to schedule CT so we are going to transport her to the ED for evaluation.       Relevant Orders   CT Abdomen Pelvis W Contrast     Other   Dehydration    Patient with 2 weeks of NVD, has lost significant weight, Orthostatic VS Laying 160/75 58, Sittin 130/65 60 Standing 129/71 67 Standing for 3 min 136/77 79         No orders of the defined types were placed in this encounter.     Follow-up: No follow-ups on file.    Beckie Salts, [place] [address], [place], [place] [address]

## 2020-04-09 NOTE — ED Notes (Signed)
Ed RN to room 4 p's addressed ,

## 2020-04-09 NOTE — Assessment & Plan Note (Addendum)
Patient with NVD x 2 weeks, says that she is having trouble even hold water down. Pain is worse on RLQ. Says that she has night sweats as well.   Plan- Unable to schedule CT so we are going to transport her to the ED for evaluation.

## 2020-04-09 NOTE — ED Notes (Addendum)
Pt upset she has been waiting for the time she has been here, ed md aware. ED RN had been in room multiple times per hour to check on patient

## 2020-04-09 NOTE — ED Provider Notes (Signed)
[facility] Emergency Department Provider Note    Event Date/Time   First MD Initiated Contact with Patient 04/09/20 1236     (approximate)  I have reviewed the triage vital signs and the nursing notes.   HISTORY  Chief Complaint Abdominal Pain and Emesis    HPI [name] [name] is a 65 y.o. female presents to the ER for evaluation of abdominal pain nausea vomiting.  Does have a history of bowel obstruction.  Does still have her appendix.  She is also having level of epigastric discomfort.  States she frequently having to belch.  Pain is in the right lower quadrant does migrate to the periumbilical region.  Is not having any measured fevers.    Past Medical History:  Diagnosis Date  . Bowel obstruction (HCC)    x1 month ago  . Cancer ([place])    stomach cancer per pt and currently lung ca  . COPD (chronic obstructive pulmonary disease) ([place])   . Essential hypertension 12/02/2019  . Personal history of radiation therapy 2019-2020   lung ca   Family History  Problem Relation Age of Onset  . Lupus Sister   . Heart failure Neg Hx   . Diabetes Neg Hx    Past Surgical History:  Procedure Laterality Date  . COLON SURGERY     Patient Active Problem List   Diagnosis Date Noted  . Intractable vomiting 04/09/2020  . Dehydration 04/09/2020  . Chest pain 04/08/2020  . Rotator cuff arthropathy of left shoulder 03/25/2020  . Cortical age-related cataract of both eyes 02/13/2020  . Adenocarcinoma of lung ([place]) 12/02/2019  . Essential hypertension 12/02/2019  . Adult failure to thrive 09/17/2019  . Cardiomyopathy ([place]) 09/17/2019  . E. coli gastroenteritis 09/09/2016  . Protein calorie malnutrition ([place]) 09/09/2016  . Tobacco abuse counseling 09/09/2016  . Hypokalemia 09/08/2016  . Ischemic bowel disease ([place]) 06/14/2014  . Intestinal angina ([place]) 06/14/2014  . COPD (chronic obstructive pulmonary disease) ([place]) 06/14/2014  . Protein-calorie malnutrition,  severe ([place]) 05/27/2014  . Abdominal pain 05/26/2014      Prior to Admission medications   Medication Sig Start Date End Date Taking? Authorizing Provider  acetaminophen (TYLENOL) 160 MG/5ML solution Take 5 mLs (160 mg total) by mouth every 6 (six) hours as needed for mild pain. 09/17/19   Cletis Athens, MD  albuterol (PROVENTIL) (2.5 MG/3ML) 0.083% nebulizer solution Take 3 mLs (2.5 mg total) by nebulization 3 (three) times daily as needed for wheezing. 09/09/16   Theodoro Grist, MD  atorvastatin (LIPITOR) 40 MG tablet Take 40 mg by mouth daily.    [provider]  buPROPion (WELLBUTRIN SR) 100 MG 12 hr tablet Take 1 tablet (100 mg total) by mouth 2 (two) times daily. 09/17/19   Cletis Athens, MD  cholestyramine Lucrezia Starch) 4 GM/DOSE powder TAKE 1 SCOOPFUL(4 GRAMS) MIXED IN 4-8 OUNCES OF LIQUID AND DRINK, DAILY 04/01/20   Cletis Athens, MD  cycloSPORINE (RESTASIS) 0.05 % ophthalmic emulsion Place 1 drop into both eyes every 12 (twelve) hours.    [provider]  feeding supplement, ENSURE ENLIVE, (ENSURE ENLIVE) LIQD Take 237 mLs by mouth 2 (two) times daily between meals. 09/10/16   Theodoro Grist, MD  fluticasone (FLONASE) 50 MCG/ACT nasal spray USE 1 SPRAY INTO EACH NOSTRIL TWICE DAILY 12/09/19   Cletis Athens, MD  HYDROcodone-acetaminophen (NORCO/VICODIN) 5-325 MG tablet Take 1 tablet by mouth every 6 (six) hours as needed for moderate pain. 08/30/19   Johnn Hai, PA-C  lisinopril (PRINIVIL,ZESTRIL) 40 MG tablet Take 40 mg by mouth daily.    [provider]  methocarbamol (ROBAXIN) 500 MG tablet Take 1 tablet (500 mg total) by mouth every 8 (eight) hours as needed for muscle spasms. 08/30/19   Johnn Hai, PA-C  mirtazapine (REMERON) 15 MG tablet Take 15 mg by mouth daily.    [provider]  omeprazole (PRILOSEC) 20 MG capsule TAKE 1 CAPSULE BY MOUTH TWICE DAILY 01/30/20   Cletis Athens, MD  ondansetron (ZOFRAN ODT) 4 MG disintegrating tablet Take 1  tablet (4 mg total) by mouth every 8 (eight) hours as needed. 07/03/19   Rudene Re, MD  pantoprazole (PROTONIX) 40 MG tablet Take 1 tablet (40 mg total) by mouth daily. 09/09/16 09/09/17  Theodoro Grist, MD  pregabalin (LYRICA) 100 MG capsule Take 100 mg by mouth 3 (three) times daily.     [provider]  pregabalin (LYRICA) 50 MG capsule TAKE 1 CAPSULE BY MOUTH 3 TIMES DAILY 06/27/19   Cletis Athens, MD  sertraline (ZOLOFT) 50 MG tablet TAKE 1 TABLET BY MOUTH TWICE DAILY 03/05/20   Beckie Salts, FNP  SYMBICORT 160-4.5 MCG/ACT inhaler INHALE 1 PUFF BY MOUTH TWICE DAILY 03/05/20   Beckie Salts, FNP  Vitamin D, Ergocalciferol, (DRISDOL) 50000 units CAPS capsule Take 50,000 Units by mouth every 7 (seven) days.    [provider]  Water For Irrigation, Sterile (FREE WATER) SOLN Place 60 mLs into feeding tube every 8 (eight) hours. 05/29/14   Fritzi Mandes, MD    Allergies Aspirin    Social History Social History   Tobacco Use  . Smoking status: Current Every Day Smoker    Packs/day: 0.25    Types: Cigarettes  . Smokeless tobacco: Never Used  Vaping Use  . Vaping Use: Never used  Substance Use Topics  . Alcohol use: No  . Drug use: No    Review of Systems Patient denies headaches, rhinorrhea, blurry vision, numbness, shortness of breath, chest pain, edema, cough, abdominal pain, nausea, vomiting, diarrhea, dysuria, fevers, rashes or hallucinations unless otherwise stated above in HPI. ____________________________________________   PHYSICAL EXAM:  VITAL SIGNS: Vitals:   04/09/20 1354 04/09/20 1506  BP: 140/81 (!) 141/78  Pulse: 60 66  Resp: 17 17  Temp:    SpO2: 98% 98%    Constitutional: Alert and oriented.  Eyes: Conjunctivae are normal.  Head: Atraumatic. Nose: No congestion/rhinnorhea. Mouth/Throat: Mucous membranes are moist.   Neck: No stridor. Painless ROM.  Cardiovascular: Normal rate, regular rhythm. Grossly normal heart sounds.  Good  peripheral circulation. Respiratory: Normal respiratory effort.  No retractions. Lungs CTAB. Gastrointestinal: Soft with mild ttp in rlq, No distention. No abdominal bruits. No CVA tenderness. Genitourinary:  Musculoskeletal: No lower extremity tenderness nor edema.  No joint effusions. Neurologic:  Normal speech and language. No gross focal neurologic deficits are appreciated. No facial droop Skin:  Skin is warm, dry and intact. No rash noted. Psychiatric: Mood and affect are normal. Speech and behavior are normal.  ____________________________________________   LABS (all labs ordered are listed, but only abnormal results are displayed)  Results for orders placed or performed during the hospital encounter of 04/09/20 (from the past 24 hour(s))  Lipase, blood     Status: None   Collection Time: 04/09/20 11:41 AM  Result Value Ref Range   Lipase 30 11 - 51 U/L  Comprehensive metabolic panel     Status: Abnormal   Collection Time: 04/09/20 11:41 AM  Result Value Ref  Range   Sodium 141 135 - 145 mmol/L   Potassium 3.8 3.5 - 5.1 mmol/L   Chloride 112 (H) 98 - 111 mmol/L   CO2 23 22 - 32 mmol/L   Glucose, Bld 73 70 - 99 mg/dL   BUN 11 8 - 23 mg/dL   Creatinine, Ser 0.64 0.44 - 1.00 mg/dL   Calcium 8.8 (L) 8.9 - 10.3 mg/dL   Total Protein 6.8 6.5 - 8.1 g/dL   Albumin 4.0 3.5 - 5.0 g/dL   AST 20 15 - 41 U/L   ALT 23 0 - 44 U/L   Alkaline Phosphatase 62 38 - 126 U/L   Total Bilirubin 0.7 0.3 - 1.2 mg/dL   GFR, Estimated >60 >60 mL/min   Anion gap 6 5 - 15  CBC     Status: Abnormal   Collection Time: 04/09/20 11:41 AM  Result Value Ref Range   WBC 10.4 4.0 - 10.5 K/uL   RBC 3.97 3.87 - 5.11 MIL/uL   Hemoglobin 13.5 12.0 - 15.0 g/dL   HCT 41.0 36.0 - 46.0 %   MCV 103.3 (H) 80.0 - 100.0 fL   MCH 34.0 26.0 - 34.0 pg   MCHC 32.9 30.0 - 36.0 g/dL   RDW 11.8 11.5 - 15.5 %   Platelets 261 150 - 400 K/uL   nRBC 0.0 0.0 - 0.2 %    ____________________________________________  EKG My review and personal interpretation at Time: 11:44   Indication: abd pain  Rate: 75  Rhythm: sinus Axis: normal Other: nonspecific st abn, no stemi,  ____________________________________________  RADIOLOGY  I personally reviewed all radiographic images ordered to evaluate for the above acute complaints and reviewed radiology reports and findings.  These findings were personally discussed with the patient.  Please see medical record for radiology report.  ____________________________________________   PROCEDURES  Procedure(s) performed:  Procedures    Critical Care performed: no ____________________________________________   INITIAL IMPRESSION / ASSESSMENT AND PLAN / ED COURSE  Pertinent labs & imaging results that were available during my care of the patient were reviewed by me and considered in my medical decision making (see chart for details).   DDX: Appendicitis, colitis, diverticulitis, SBO, enteritis, electrolyte abnormality  [name] [name] is a 65 y.o. who presents to the ED with symptoms as described above.  She is afebrile hemodynamically stable.  Does have some abdominal pain therefore based on her presentation will order CT abdomen to further evaluate.  Will give pain medication and reassess.  Clinical Course as of 04/09/20 1529  Thu Apr 09, 2020  1434 CT imaging without evidence of acute appendicitis.  Blood work is reassuring.  Given her lower abdominal pain with CT imaging suggesting possible pelvic abnormality and recommendation of pelvic ultrasound will order this.  Patient made hemodynamically stable.  Patient was signed out to oncoming physician pending follow-up ultrasound. [PR]    Clinical Course User Index [PR] Merlyn Lot, MD    The patient was evaluated in Emergency Department today for the symptoms described in the history of present illness. He/she was evaluated in the context of the  global COVID-19 pandemic, which necessitated consideration that the patient might be at risk for infection with the SARS-CoV-2 virus that causes COVID-19. Institutional protocols and algorithms that pertain to the evaluation of patients at risk for COVID-19 are in a state of rapid change based on information released by regulatory bodies including the CDC and federal and state organizations. These policies and algorithms were followed during  the patient's care in the ED.  As part of my medical decision making, I reviewed the following data within the [place] notes reviewed and incorporated, Labs reviewed, notes from prior ED visits and [place] Controlled Substance Database   ____________________________________________   FINAL CLINICAL IMPRESSION(S) / ED DIAGNOSES  Final diagnoses:  Abnormal finding on CT scan  Abdominal pain      NEW MEDICATIONS STARTED DURING THIS VISIT:  New Prescriptions   No medications on file     Note:  This document was prepared using Dragon voice recognition software and may include unintentional dictation errors.    Merlyn Lot, MD 04/09/20 [phone]

## 2020-04-09 NOTE — ED Notes (Signed)
Gave patient some water verbal per ed md

## 2020-04-09 NOTE — ED Notes (Signed)
Asked patient to give a urine sample pt stated she did not have to urinate

## 2020-04-09 NOTE — ED Notes (Signed)
4 p's addressed

## 2020-04-09 NOTE — ED Triage Notes (Signed)
Pt comes into the ED vi {POV c/o right side abdominal pain, emesis, and diarrhea that started 2 weeks ago.  Pt states she still has her appendix.  Pt does state there is pain with palpation.  Pt in NAD with even and unlabored respirations.  Pt denies any problems with urination.

## 2020-04-09 NOTE — Discharge Instructions (Signed)
Use Tylenol for pain and fevers.  Up to 1000 mg per dose, up to 4 times per day.  Do not take more than 4000 mg of Tylenol/acetaminophen within 24 hours..  Return to the ED with any worsening pain.  Fevers with your pain.

## 2020-04-09 NOTE — ED Provider Notes (Signed)
Patient received in signout from Dr. Quentin Cornwall pending pelvic ultrasound and urinalysis.  Pelvic ultrasound reviewed with nondiagnostic study.  I reevaluate the patient multiple times and she has a benign abdominal exam, but is refusing to provide a urine sample.  She becomes increasingly frustrated and demands discharge without providing a urine sample.  We discussed return precautions for the ED prior to her discharge.  Clinical Course as of 04/09/20 1925  Thu Apr 09, 2020  1434 CT imaging without evidence of acute appendicitis.  Blood work is reassuring.  Given her lower abdominal pain with CT imaging suggesting possible pelvic abnormality and recommendation of pelvic ultrasound will order this.  Patient made hemodynamically stable.  Patient was signed out to oncoming physician pending follow-up ultrasound. [PR]  1725 Reassessed.  Patient reports improving abdominal pain.  I provide her with ginger ale for p.o. challenge.  She is tearful and requesting food.  Educated nurse of need for p.o. challenge.  Advised patient of need for urine sample, she is agreeable. [DS]  1923 Reassessed.  Patient reports improved pain.  She expresses frustration with the lack of attention that she is received.  We discussed continued need for urinalysis, but she is refusing to provide a urine sample.  She requests discharge.  I expressed to her my recommendations for urinalysis, but she continues to decline.  She states that she is ready to go and would not have any more tests performed.  We discussed the possibility of untreated UTI, but she reports that she does not care.  We discussed return precautions for the ED.  Reexamination of her abdomen at this time reveals a benign exam throughout without peritoneal features. [DS]    Clinical Course User Index [DS] Vladimir Crofts, MD [PR] Merlyn Lot, MD      Vladimir Crofts, MD 04/09/20 [phone]

## 2020-04-09 NOTE — ED Notes (Addendum)
Ed rn charge made aware patient is upset that she has not been updated ,

## 2020-04-09 NOTE — ED Notes (Signed)
Ed rn to room to check on patient. No requests at this time

## 2020-04-09 NOTE — Assessment & Plan Note (Signed)
Patient with 2 weeks of NVD, has lost significant weight, Orthostatic VS Laying 160/75 58, Sittin 130/65 60 Standing 129/71 67 Standing for 3 min 136/77 79

## 2020-04-10 ENCOUNTER — Telehealth: Payer: Self-pay

## 2020-04-10 DIAGNOSIS — I10 Essential (primary) hypertension: Secondary | ICD-10-CM

## 2020-04-10 DIAGNOSIS — J410 Simple chronic bronchitis: Secondary | ICD-10-CM

## 2020-04-10 DIAGNOSIS — I429 Cardiomyopathy, unspecified: Secondary | ICD-10-CM

## 2020-04-10 NOTE — Telephone Encounter (Addendum)
Transition Care Management Follow-up Telephone Call  Date of discharge and from where: 04/09/2020 from [facility]  How have you been since you were released from the hospital? Pt states that she is feeling some better.   Any questions or concerns? No  Items Reviewed:  Did the pt receive and understand the discharge instructions provided? Yes   Medications obtained and verified? Yes   Other? No   Any new allergies since your discharge? No   Dietary orders reviewed? n/a  Do you have support at home? No   Functional Questionnaire: (I = Independent and D = Dependent) ADLs: I  Bathing/Dressing- I  Meal Prep- I  Eating- I  Maintaining continence- I  Transferring/Ambulation- D pt uses a cane to ambulate.   Managing Meds- I   Follow up appointments reviewed:   PCP Hospital f/u appt confirmed? No    Specialist Hospital f/u appt confirmed? No    Are transportation arrangements needed? No   If their condition worsens, is the pt aware to call PCP or go to the Emergency Dept.? Yes  Was the patient provided with contact information for the PCP's office or ED? Yes  Was to pt encouraged to call back with questions or concerns? Yes   Pt stated that she is getting evicted and has until the 24th. Pt stated that the notice did not have a date of delivery on the notice. Pt stated that she does not have any place secured at this time.

## 2020-04-13 ENCOUNTER — Other Ambulatory Visit: Payer: Self-pay

## 2020-04-14 ENCOUNTER — Other Ambulatory Visit: Payer: Self-pay | Admitting: *Deleted

## 2020-04-14 NOTE — Patient Outreach (Signed)
[facility]) Care Management  04/14/2020  [name] [name] 04/12/1955 [id number]   Referral Date: 3/21 Referral Source: MD office Referral Reason: Pharmacy (Medication Assistance, Polypharmacy, Medication Adherence, Medication Management) & Licensed Clinical Social Work Youth worker, Housing, Sales promotion account executive)  Insurance: [employer]   Outreach attempt #1, unsuccessful, HIPAA compliant voice message left.  Plan: RN CM will send unsuccessful outreach letter and follow up within the next 3-4 business days.  Valente David, [place], MSN [place] [phone]

## 2020-04-16 ENCOUNTER — Ambulatory Visit: Payer: Medicare Other | Admitting: Family Medicine

## 2020-04-20 ENCOUNTER — Other Ambulatory Visit: Payer: Self-pay | Admitting: *Deleted

## 2020-04-20 NOTE — Patient Outreach (Signed)
[place] [facility]) Care Management  04/20/2020  [name] [name] [birth year] [id number]    Referral Source: MD office Referral Reason: Pharmacy (Medication Assistance, Polypharmacy, Medication Adherence, Medication Management) & Licensed Clinical Social Work Youth worker, Housing, Sales promotion account executive)  Insurance: [facility]   Outreach attempt #2, successful however report this is not a good time to talk, in the process of moving.  Request call back later this afternoon or tomorrow morning.  Plan: RN CM will call back tomorrow morning as requested.  Valente David, [place], MSN [place] [phone]

## 2020-04-21 ENCOUNTER — Other Ambulatory Visit: Payer: Self-pay | Admitting: *Deleted

## 2020-04-21 NOTE — Patient Outreach (Signed)
[place] [facility]) Care Management  04/21/2020  [name] [name] [birth year] [id number]   Referral Source:MD office Referral Reason:Pharmacy(Medication Assistance,Polypharmacy,Medication Adherence,Medication Management) &Licensed Clinical Social Work(Community Support Resources,Housing,Financial Strain) Insurance:[employer]   Outreach attempt #2 this morning as member requested yesterday, unsuccessful.  HIPAA compliant voice message left.  Will follow up with 3rd attempt within the next 3-4 business days.  Valente David, [place], MSN [place] [phone]

## 2020-04-24 ENCOUNTER — Other Ambulatory Visit: Payer: Self-pay | Admitting: *Deleted

## 2020-04-24 NOTE — Patient Outreach (Signed)
[place] [facility]) Care Management  04/24/2020  LURA FALOR 01/19/1956 [id number]   Referral Source:MD office Referral Reason:Pharmacy(Medication Assistance,Polypharmacy,Medication Adherence,Medication Management) &Licensed Clinical Social Work(Community Support Resources,Housing,Financial Strain) Insurance:[employer]   Outreach attempt #3, member report this is not a good time to talk as she is in class currently.  Request made to call this care manager back, she verbalizes understanding.  Will follow up with 4th and final attempt within the next 3 weeks.  If remain unsuccessful and unable to complete assessment, will close case.  Valente David, [place], MSN [place] [phone]

## 2020-04-27 ENCOUNTER — Other Ambulatory Visit: Payer: Self-pay

## 2020-04-27 ENCOUNTER — Encounter: Payer: Self-pay | Admitting: Internal Medicine

## 2020-04-27 ENCOUNTER — Ambulatory Visit (INDEPENDENT_AMBULATORY_CARE_PROVIDER_SITE_OTHER): Payer: Medicare Other | Admitting: Internal Medicine

## 2020-04-27 VITALS — BP 148/80 | HR 85 | Ht 65.0 in | Wt 84.0 lb

## 2020-04-27 DIAGNOSIS — I10 Essential (primary) hypertension: Secondary | ICD-10-CM | POA: Diagnosis not present

## 2020-04-27 DIAGNOSIS — E440 Moderate protein-calorie malnutrition: Secondary | ICD-10-CM

## 2020-04-27 DIAGNOSIS — Z716 Tobacco abuse counseling: Secondary | ICD-10-CM

## 2020-04-27 DIAGNOSIS — K559 Vascular disorder of intestine, unspecified: Secondary | ICD-10-CM | POA: Diagnosis not present

## 2020-04-27 DIAGNOSIS — J410 Simple chronic bronchitis: Secondary | ICD-10-CM

## 2020-04-27 NOTE — Assessment & Plan Note (Signed)
Stable at the present time. 

## 2020-04-27 NOTE — Assessment & Plan Note (Addendum)
Patient blood pressure is normal patient denies any chest pain or shortness of breath there is no history of palpitation paroxysmal nocturnal dyspnea patient can walk 25 yards without any problem patient was advised to follow low-salt low-cholesterol diet

## 2020-04-27 NOTE — Assessment & Plan Note (Signed)
Patient was advised to gain some weight.

## 2020-04-27 NOTE — Progress Notes (Signed)
Established Patient Office Visit  Subjective:  Patient ID: [name] [name], female    DOB: Sep 09, 1955  Age: 65 y.o. MRN: [id number]  CC:  Chief Complaint  Patient presents with  . Medicare Wellness    HPI  [name] [name] presents for check up  Past Medical History:  Diagnosis Date  . Bowel obstruction (HCC)    x1 month ago  . Cancer ([place])    stomach cancer per pt and currently lung ca  . COPD (chronic obstructive pulmonary disease) ([place])   . Essential hypertension 12/02/2019  . Personal history of radiation therapy 2019-2020   lung ca    Past Surgical History:  Procedure Laterality Date  . COLON SURGERY      Family History  Problem Relation Age of Onset  . Lupus Sister   . Heart failure Neg Hx   . Diabetes Neg Hx     Social History   Socioeconomic History  . Marital status: Widowed    Spouse name: Not on file  . Number of children: Not on file  . Years of education: Not on file  . Highest education level: Not on file  Occupational History  . Occupation: on disability  Tobacco Use  . Smoking status: Current Every Day Smoker    Packs/day: 0.25    Types: Cigarettes  . Smokeless tobacco: Never Used  Vaping Use  . Vaping Use: Never used  Substance and Sexual Activity  . Alcohol use: No  . Drug use: No  . Sexual activity: Not on file  Other Topics Concern  . Not on file  Social History Narrative  . Not on file   Social Determinants of Health   Financial Resource Strain: Not on file  Food Insecurity: Not on file  Transportation Needs: Not on file  Physical Activity: Not on file  Stress: Not on file  Social Connections: Not on file  Intimate Partner Violence: Not on file     Current Outpatient Medications:  .  acetaminophen (TYLENOL) 160 MG/5ML solution, Take 5 mLs (160 mg total) by mouth every 6 (six) hours as needed for mild pain., Disp: 120 mL, Rfl: 6 .  albuterol (PROVENTIL) (2.5 MG/3ML) 0.083% nebulizer solution, Take 3 mLs (2.5 mg total) by  nebulization 3 (three) times daily as needed for wheezing., Disp: 75 mL, Rfl: 12 .  atorvastatin (LIPITOR) 40 MG tablet, Take 40 mg by mouth daily., Disp: , Rfl:  .  buPROPion (WELLBUTRIN SR) 100 MG 12 hr tablet, Take 1 tablet (100 mg total) by mouth 2 (two) times daily., Disp: 60 tablet, Rfl: 2 .  cholestyramine (QUESTRAN) 4 GM/DOSE powder, TAKE 1 SCOOPFUL(4 GRAMS) MIXED IN 4-8 OUNCES OF LIQUID AND DRINK, DAILY, Disp: 378 g, Rfl: 3 .  cycloSPORINE (RESTASIS) 0.05 % ophthalmic emulsion, Place 1 drop into both eyes every 12 (twelve) hours., Disp: , Rfl:  .  feeding supplement, ENSURE ENLIVE, (ENSURE ENLIVE) LIQD, Take 237 mLs by mouth 2 (two) times daily between meals., Disp: 237 mL, Rfl: 12 .  fluticasone (FLONASE) 50 MCG/ACT nasal spray, USE 1 SPRAY INTO EACH NOSTRIL TWICE DAILY, Disp: 16 g, Rfl: 6 .  HYDROcodone-acetaminophen (NORCO/VICODIN) 5-325 MG tablet, Take 1 tablet by mouth every 6 (six) hours as needed for moderate pain., Disp: 15 tablet, Rfl: 0 .  lisinopril (PRINIVIL,ZESTRIL) 40 MG tablet, Take 40 mg by mouth daily., Disp: , Rfl:  .  methocarbamol (ROBAXIN) 500 MG tablet, Take 1 tablet (500 mg total) by mouth every 8 (eight)  hours as needed for muscle spasms., Disp: 12 tablet, Rfl: 0 .  mirtazapine (REMERON) 15 MG tablet, Take 15 mg by mouth daily., Disp: , Rfl:  .  omeprazole (PRILOSEC) 20 MG capsule, TAKE 1 CAPSULE BY MOUTH TWICE DAILY, Disp: 180 capsule, Rfl: 0 .  ondansetron (ZOFRAN ODT) 4 MG disintegrating tablet, Take 1 tablet (4 mg total) by mouth every 8 (eight) hours as needed., Disp: 20 tablet, Rfl: 0 .  pantoprazole (PROTONIX) 40 MG tablet, Take 1 tablet (40 mg total) by mouth daily., Disp: 30 tablet, Rfl: 1 .  pregabalin (LYRICA) 100 MG capsule, Take 100 mg by mouth 3 (three) times daily. , Disp: , Rfl:  .  pregabalin (LYRICA) 50 MG capsule, TAKE 1 CAPSULE BY MOUTH 3 TIMES DAILY, Disp: 90 capsule, Rfl: 0 .  sertraline (ZOLOFT) 50 MG tablet, TAKE 1 TABLET BY MOUTH TWICE DAILY,  Disp: 60 tablet, Rfl: 1 .  SYMBICORT 160-4.5 MCG/ACT inhaler, INHALE 1 PUFF BY MOUTH TWICE DAILY, Disp: 10.2 g, Rfl: 1 .  Vitamin D, Ergocalciferol, (DRISDOL) 50000 units CAPS capsule, Take 50,000 Units by mouth every 7 (seven) days., Disp: , Rfl:  .  Water For Irrigation, Sterile (FREE WATER) SOLN, Place 60 mLs into feeding tube every 8 (eight) hours., Disp: 60 mL, Rfl: 0   Allergies  Allergen Reactions  . Aspirin Hives and Itching    ROS Review of Systems  Constitutional: Negative.   HENT: Negative.   Eyes: Negative for itching.  Cardiovascular: Negative for chest pain.  Gastrointestinal: Negative for abdominal pain, blood in stool, constipation, diarrhea, nausea, rectal pain and vomiting.  Genitourinary: Negative.   Musculoskeletal: Positive for arthralgias, gait problem and myalgias. Negative for back pain and neck pain.  Skin: Negative for pallor.  Psychiatric/Behavioral: Positive for confusion and dysphoric mood. Negative for suicidal ideas. The patient is nervous/anxious.       Objective:    Physical Exam Vitals reviewed.  Constitutional:      Comments: emaciated  HENT:     Head: Normocephalic.     Comments: alopecia    Mouth/Throat:     Mouth: Mucous membranes are moist.  Eyes:     Pupils: Pupils are equal, round, and reactive to light.  Neck:     Vascular: No carotid bruit.  Cardiovascular:     Rate and Rhythm: Normal rate and regular rhythm.     Pulses: Normal pulses.     Heart sounds: Normal heart sounds.  Pulmonary:     Effort: Pulmonary effort is normal.     Breath sounds: Normal breath sounds.  Abdominal:     General: Bowel sounds are normal.     Palpations: Abdomen is soft. There is no hepatomegaly, splenomegaly or mass.     Tenderness: There is no abdominal tenderness.     Hernia: No hernia is present.  Musculoskeletal:        General: No tenderness.     Cervical back: Neck supple.     Right lower leg: No edema.     Left lower leg: No edema.   Skin:    Findings: No rash.  Neurological:     General: No focal deficit present.     Mental Status: She is alert and oriented to person, place, and time.     Motor: No weakness.     Gait: Gait abnormal.  Psychiatric:        Mood and Affect: Affect normal.        Behavior: Behavior normal.  BP (!) 148/80   Pulse 85   Ht 5\' 5"  (1.651 m)   Wt 84 lb (38.1 kg)   BMI 13.98 kg/m  Wt Readings from Last 3 Encounters:  04/27/20 84 lb (38.1 kg)  04/09/20 84 lb (38.1 kg)  04/09/20 85 lb 8 oz (38.8 kg)     Health Maintenance Due  Topic Date Due  . Hepatitis C Screening  Never done  . PAP SMEAR-Modifier  Never done  . COLONOSCOPY (Pts 45-15yrs Insurance coverage will need to be confirmed)  Never done  . COVID-19 Vaccine (4 - Booster for Pfizer series) 04/07/2020    There are no preventive care reminders to display for this patient.  No results found for: TSH Lab Results  Component Value Date   WBC 10.4 04/09/2020   HGB 13.5 04/09/2020   HCT 41.0 04/09/2020   MCV 103.3 (H) 04/09/2020   PLT 261 04/09/2020   Lab Results  Component Value Date   NA 141 04/09/2020   K 3.8 04/09/2020   CO2 23 04/09/2020   GLUCOSE 73 04/09/2020   BUN 11 04/09/2020   CREATININE 0.64 04/09/2020   BILITOT 0.7 04/09/2020   ALKPHOS 62 04/09/2020   AST 20 04/09/2020   ALT 23 04/09/2020   PROT 6.8 04/09/2020   ALBUMIN 4.0 04/09/2020   CALCIUM 8.8 (L) 04/09/2020   ANIONGAP 6 04/09/2020   No results found for: CHOL No results found for: HDL No results found for: [facility] Lab Results  Component Value Date   TRIG 107 05/30/2014   No results found for: CHOLHDL No results found for: HGBA1C    Assessment & Plan:   Problem List Items Addressed This Visit      Cardiovascular and Mediastinum   Ischemic bowel disease ([place]) - Primary    Stable at the present time      Essential hypertension    Patient blood pressure is normal patient denies any chest pain or shortness of breath there is  no history of palpitation paroxysmal nocturnal dyspnea patient can walk 25 yards without any problem patient was advised to follow low-salt low-cholesterol diet              Respiratory   COPD (chronic obstructive pulmonary disease) ([place]) (Chronic)    Patient was advised to quit smoking completely.        Other   Protein calorie malnutrition ([place])    Patient was advised to gain some weight.      Tobacco abuse counseling    - I instructed the patient to stop smoking and provided them with smoking cessation materials.  - I informed the patient that smoking puts them at increased risk for cancer, COPD, hypertension, and more.  - Informed the patient to seek help if they begin to have trouble breathing, develop chest pain, start to cough up blood, feel faint, or pass out.         No orders of the defined types were placed in this encounter.   Follow-up: No follow-ups on file.    Cletis Athens, MD

## 2020-04-27 NOTE — Assessment & Plan Note (Signed)
Patient was advised to quit smoking completely °

## 2020-04-27 NOTE — Assessment & Plan Note (Signed)
-   I instructed the patient to stop smoking and provided them with smoking cessation materials.  - I informed the patient that smoking puts them at increased risk for cancer, COPD, hypertension, and more.  - Informed the patient to seek help if they begin to have trouble breathing, develop chest pain, start to cough up blood, feel faint, or pass out.  

## 2020-05-15 ENCOUNTER — Other Ambulatory Visit: Payer: Self-pay | Admitting: *Deleted

## 2020-05-15 NOTE — Patient Outreach (Signed)
[place] [facility]) Care Management  05/15/2020  [name] [name] [birth year] [id number]    Referral Source:MD office Referral Reason:Pharmacy(Medication Assistance,Polypharmacy,Medication Adherence,Medication Management) &Licensed Clinical Social Work(Community Support Resources,Housing,Financial Strain) Insurance:[employer]   Outreach attempt #4, no answer, HIPAA compliant voice message left.  No response from member after multiple unsuccessful outreach attempts and letter sent.  Will close case at this time due to inability to maintain contact.  Will notify member and primary MD of case closure.  Valente David, [place], MSN [place] [phone]

## 2020-05-19 ENCOUNTER — Encounter: Payer: Self-pay | Admitting: Internal Medicine

## 2020-05-19 ENCOUNTER — Other Ambulatory Visit: Payer: Self-pay

## 2020-05-19 ENCOUNTER — Ambulatory Visit (INDEPENDENT_AMBULATORY_CARE_PROVIDER_SITE_OTHER): Payer: Medicare Other | Admitting: Internal Medicine

## 2020-05-19 VITALS — BP 129/64 | HR 65 | Ht 65.0 in | Wt 89.1 lb

## 2020-05-19 DIAGNOSIS — D219 Benign neoplasm of connective and other soft tissue, unspecified: Secondary | ICD-10-CM

## 2020-05-19 DIAGNOSIS — J410 Simple chronic bronchitis: Secondary | ICD-10-CM | POA: Diagnosis not present

## 2020-05-19 DIAGNOSIS — E440 Moderate protein-calorie malnutrition: Secondary | ICD-10-CM | POA: Diagnosis not present

## 2020-05-19 DIAGNOSIS — H7419 Adhesive middle ear disease, unspecified ear: Secondary | ICD-10-CM | POA: Diagnosis not present

## 2020-05-19 DIAGNOSIS — I10 Essential (primary) hypertension: Secondary | ICD-10-CM

## 2020-05-19 DIAGNOSIS — Z716 Tobacco abuse counseling: Secondary | ICD-10-CM | POA: Diagnosis not present

## 2020-05-19 NOTE — Assessment & Plan Note (Signed)
Blood pressure stable at the present time

## 2020-05-19 NOTE — Assessment & Plan Note (Signed)
Patient will be referred to the gynecologist.

## 2020-05-19 NOTE — Progress Notes (Signed)
Established Patient Office Visit  Subjective:  Patient ID: [name] [name], female    DOB: Jun 27, 1955  Age: 65 y.o. MRN: [id number]  CC:  Chief Complaint  Patient presents with  . Radiology Results    Patient has concerns about her CT results    HPI  [name] [name] presents for check up, patient does not have any small bowel obstruction.  She was found to have 1.5 cm density in the left ovary.  Ultrasound also shows a 1 cm spot in the uterus consistent with a fibroid.  There is no change since the previous examination of 21.  There is also a 1.5 cm lesion in the right kidney which is unchanged since 21.  Past Medical History:  Diagnosis Date  . Bowel obstruction (HCC)    x1 month ago  . Cancer ([place])    stomach cancer per pt and currently lung ca  . COPD (chronic obstructive pulmonary disease) ([place])   . Essential hypertension 12/02/2019  . Personal history of radiation therapy 2019-2020   lung ca    Past Surgical History:  Procedure Laterality Date  . COLON SURGERY      Family History  Problem Relation Age of Onset  . Lupus Sister   . Heart failure Neg Hx   . Diabetes Neg Hx     Social History   Socioeconomic History  . Marital status: Widowed    Spouse name: Not on file  . Number of children: Not on file  . Years of education: Not on file  . Highest education level: Not on file  Occupational History  . Occupation: on disability  Tobacco Use  . Smoking status: Current Every Day Smoker    Packs/day: 0.25    Types: Cigarettes  . Smokeless tobacco: Never Used  Vaping Use  . Vaping Use: Never used  Substance and Sexual Activity  . Alcohol use: No  . Drug use: No  . Sexual activity: Not on file  Other Topics Concern  . Not on file  Social History Narrative  . Not on file   Social Determinants of Health   Financial Resource Strain: Not on file  Food Insecurity: Not on file  Transportation Needs: Not on file  Physical Activity: Not on file  Stress: Not  on file  Social Connections: Not on file  Intimate Partner Violence: Not on file     Current Outpatient Medications:  .  acetaminophen (TYLENOL) 160 MG/5ML solution, Take 5 mLs (160 mg total) by mouth every 6 (six) hours as needed for mild pain., Disp: 120 mL, Rfl: 6 .  albuterol (PROVENTIL) (2.5 MG/3ML) 0.083% nebulizer solution, Take 3 mLs (2.5 mg total) by nebulization 3 (three) times daily as needed for wheezing., Disp: 75 mL, Rfl: 12 .  atorvastatin (LIPITOR) 40 MG tablet, Take 40 mg by mouth daily., Disp: , Rfl:  .  buPROPion (WELLBUTRIN SR) 100 MG 12 hr tablet, Take 1 tablet (100 mg total) by mouth 2 (two) times daily., Disp: 60 tablet, Rfl: 2 .  cholestyramine (QUESTRAN) 4 GM/DOSE powder, TAKE 1 SCOOPFUL(4 GRAMS) MIXED IN 4-8 OUNCES OF LIQUID AND DRINK, DAILY, Disp: 378 g, Rfl: 3 .  cycloSPORINE (RESTASIS) 0.05 % ophthalmic emulsion, Place 1 drop into both eyes every 12 (twelve) hours., Disp: , Rfl:  .  feeding supplement, ENSURE ENLIVE, (ENSURE ENLIVE) LIQD, Take 237 mLs by mouth 2 (two) times daily between meals., Disp: 237 mL, Rfl: 12 .  fluticasone (FLONASE) 50 MCG/ACT nasal spray,  USE 1 SPRAY INTO EACH NOSTRIL TWICE DAILY, Disp: 16 g, Rfl: 6 .  HYDROcodone-acetaminophen (NORCO/VICODIN) 5-325 MG tablet, Take 1 tablet by mouth every 6 (six) hours as needed for moderate pain., Disp: 15 tablet, Rfl: 0 .  lisinopril (PRINIVIL,ZESTRIL) 40 MG tablet, Take 40 mg by mouth daily., Disp: , Rfl:  .  methocarbamol (ROBAXIN) 500 MG tablet, Take 1 tablet (500 mg total) by mouth every 8 (eight) hours as needed for muscle spasms., Disp: 12 tablet, Rfl: 0 .  mirtazapine (REMERON) 15 MG tablet, Take 15 mg by mouth daily., Disp: , Rfl:  .  omeprazole (PRILOSEC) 20 MG capsule, TAKE 1 CAPSULE BY MOUTH TWICE DAILY, Disp: 180 capsule, Rfl: 0 .  ondansetron (ZOFRAN ODT) 4 MG disintegrating tablet, Take 1 tablet (4 mg total) by mouth every 8 (eight) hours as needed., Disp: 20 tablet, Rfl: 0 .  pantoprazole  (PROTONIX) 40 MG tablet, Take 1 tablet (40 mg total) by mouth daily., Disp: 30 tablet, Rfl: 1 .  pregabalin (LYRICA) 100 MG capsule, Take 100 mg by mouth 3 (three) times daily. , Disp: , Rfl:  .  pregabalin (LYRICA) 50 MG capsule, TAKE 1 CAPSULE BY MOUTH 3 TIMES DAILY, Disp: 90 capsule, Rfl: 0 .  sertraline (ZOLOFT) 50 MG tablet, TAKE 1 TABLET BY MOUTH TWICE DAILY, Disp: 60 tablet, Rfl: 1 .  SYMBICORT 160-4.5 MCG/ACT inhaler, INHALE 1 PUFF BY MOUTH TWICE DAILY, Disp: 10.2 g, Rfl: 1 .  Vitamin D, Ergocalciferol, (DRISDOL) 50000 units CAPS capsule, Take 50,000 Units by mouth every 7 (seven) days., Disp: , Rfl:  .  Water For Irrigation, Sterile (FREE WATER) SOLN, Place 60 mLs into feeding tube every 8 (eight) hours., Disp: 60 mL, Rfl: 0   Allergies  Allergen Reactions  . Aspirin Hives and Itching    ROS Review of Systems  Constitutional: Negative.   HENT: Positive for congestion. Negative for postnasal drip.   Eyes: Negative.   Respiratory: Negative.  Negative for wheezing.   Cardiovascular: Negative.  Negative for leg swelling.  Gastrointestinal: Negative.   Endocrine: Negative.   Genitourinary: Negative.  Negative for flank pain, pelvic pain, vaginal bleeding, vaginal discharge and vaginal pain.  Musculoskeletal: Negative.  Negative for joint swelling.  Skin: Negative.   Allergic/Immunologic: Negative.   Neurological: Positive for headaches. Negative for seizures.  Hematological: Negative.   Psychiatric/Behavioral: Negative.   All other systems reviewed and are negative.     Objective:    Physical Exam Vitals reviewed.  Constitutional:      Appearance: Normal appearance.  HENT:     Mouth/Throat:     Mouth: Mucous membranes are moist.  Eyes:     Pupils: Pupils are equal, round, and reactive to light.  Neck:     Vascular: No carotid bruit.  Cardiovascular:     Rate and Rhythm: Normal rate and regular rhythm.     Pulses: Normal pulses.     Heart sounds: Normal heart  sounds.  Pulmonary:     Effort: Pulmonary effort is normal.     Breath sounds: Normal breath sounds.  Abdominal:     General: Bowel sounds are normal.     Palpations: Abdomen is soft. There is no hepatomegaly, splenomegaly or mass.     Tenderness: There is no abdominal tenderness.     Hernia: No hernia is present.  Musculoskeletal:        General: No tenderness.     Cervical back: Neck supple.     Right lower leg:  No edema.     Left lower leg: No edema.  Skin:    Findings: No rash.  Neurological:     Mental Status: She is alert and oriented to person, place, and time.     Motor: No weakness.  Psychiatric:        Mood and Affect: Mood and affect normal.        Behavior: Behavior normal.     BP 129/64   Pulse 65   Ht 5\' 5"  (1.651 m)   Wt 89 lb 1.6 oz (40.4 kg)   BMI 14.83 kg/m  Wt Readings from Last 3 Encounters:  05/19/20 89 lb 1.6 oz (40.4 kg)  04/27/20 84 lb (38.1 kg)  04/09/20 84 lb (38.1 kg)     Health Maintenance Due  Topic Date Due  . Hepatitis C Screening  Never done  . PAP SMEAR-Modifier  Never done  . COLONOSCOPY (Pts 45-43yrs Insurance coverage will need to be confirmed)  Never done  . COVID-19 Vaccine (4 - Booster for Pfizer series) 04/07/2020    There are no preventive care reminders to display for this patient.  No results found for: TSH Lab Results  Component Value Date   WBC 10.4 04/09/2020   HGB 13.5 04/09/2020   HCT 41.0 04/09/2020   MCV 103.3 (H) 04/09/2020   PLT 261 04/09/2020   Lab Results  Component Value Date   NA 141 04/09/2020   K 3.8 04/09/2020   CO2 23 04/09/2020   GLUCOSE 73 04/09/2020   BUN 11 04/09/2020   CREATININE 0.64 04/09/2020   BILITOT 0.7 04/09/2020   ALKPHOS 62 04/09/2020   AST 20 04/09/2020   ALT 23 04/09/2020   PROT 6.8 04/09/2020   ALBUMIN 4.0 04/09/2020   CALCIUM 8.8 (L) 04/09/2020   ANIONGAP 6 04/09/2020   No results found for: CHOL No results found for: HDL No results found for: [facility] Lab Results   Component Value Date   TRIG 107 05/30/2014   No results found for: CHOLHDL No results found for: HGBA1C    Assessment & Plan:   Problem List Items Addressed This Visit      Cardiovascular and Mediastinum   Essential hypertension - Primary    Blood pressure stable at the present time        Respiratory   COPD (chronic obstructive pulmonary disease) ([place]) (Chronic)    - I instructed the patient to stop smoking and provided them with smoking cessation materials.  - I informed the patient that smoking puts them at increased risk for cancer, COPD, hypertension, and more.  - Informed the patient to seek help if they begin to have trouble breathing, develop chest pain, start to cough up blood, feel faint, or pass out.        Other   Protein calorie malnutrition ([place])    Patient was advised to gain weight.  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      Tobacco abuse counseling    Patient was advised to quit smoking completely.      Fibroid    Patient will be referred to the gynecologist.       Other Visit Diagnoses    Adhesive middle ear disease, unspecified laterality          No orders of the defined types were placed in this encounter.   Follow-up: No follow-ups on file.    Cletis Athens, MD

## 2020-05-19 NOTE — Assessment & Plan Note (Signed)
Patient was advised to gain weight.  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

## 2020-05-19 NOTE — Assessment & Plan Note (Signed)
-   I instructed the patient to stop smoking and provided them with smoking cessation materials.  - I informed the patient that smoking puts them at increased risk for cancer, COPD, hypertension, and more.  - Informed the patient to seek help if they begin to have trouble breathing, develop chest pain, start to cough up blood, feel faint, or pass out.  

## 2020-05-19 NOTE — Assessment & Plan Note (Signed)
Patient was advised to quit smoking completely °

## 2020-05-22 ENCOUNTER — Other Ambulatory Visit: Payer: Self-pay

## 2020-05-30 ENCOUNTER — Other Ambulatory Visit: Payer: Self-pay | Admitting: Internal Medicine

## 2020-06-10 ENCOUNTER — Encounter: Payer: Self-pay | Admitting: Obstetrics and Gynecology

## 2020-06-10 ENCOUNTER — Other Ambulatory Visit: Payer: Self-pay

## 2020-06-10 ENCOUNTER — Ambulatory Visit (INDEPENDENT_AMBULATORY_CARE_PROVIDER_SITE_OTHER): Payer: 59 | Admitting: Obstetrics and Gynecology

## 2020-06-10 VITALS — BP 134/66 | HR 69 | Ht 65.0 in | Wt 92.1 lb

## 2020-06-10 DIAGNOSIS — D219 Benign neoplasm of connective and other soft tissue, unspecified: Secondary | ICD-10-CM | POA: Diagnosis not present

## 2020-06-10 NOTE — Progress Notes (Signed)
HPI:      [name] is a 65 y.o. [id number] who LMP was No LMP recorded. Patient is postmenopausal.  Subjective:   She presents today because during her work-up for abdominal pain her CT showed a small uterine fibroid. Patient reports that she is not having any vaginal bleeding.  She states that she has been in menopause since her 31s.    Hx: The following portions of the patient's history were reviewed and updated as appropriate:             She  has a past medical history of Bowel obstruction ([place]), Cancer ([place]), COPD (chronic obstructive pulmonary disease) ([place]), Essential hypertension (12/02/2019), Lung cancer ([place]), and Personal history of radiation therapy (2019-2020). She does not have any pertinent problems on file. She  has a past surgical history that includes Colon surgery. Her family history includes Lupus in her sister. She  reports that she has been smoking cigarettes. She has been smoking about 0.25 packs per day. She has never used smokeless tobacco. She reports current alcohol use. She reports that she does not use drugs. She has a current medication list which includes the following prescription(s): acetaminophen, albuterol, atorvastatin, bupropion, cefuroxime, cholestyramine, cyclobenzaprine, cyclosporine, feeding supplement, fluticasone, hydrochlorothiazide, hydrocodone-acetaminophen, hydroxyzine, lipase/protease/amylase, lisinopril, methocarbamol, mirtazapine, omeprazole, ondansetron, pregabalin, sertraline, symbicort, vitamin d (ergocalciferol), free water, pantoprazole, and pregabalin. She is allergic to aspirin.       Review of Systems:  Review of Systems  Constitutional: Denied constitutional symptoms, night sweats, recent illness, fatigue, fever, insomnia and weight loss.  Eyes: Denied eye symptoms, eye pain, photophobia, vision change and visual disturbance.  Ears/Nose/Throat/Neck: Denied ear, nose, throat or neck symptoms, hearing loss, nasal discharge, sinus  congestion and sore throat.  Cardiovascular: Denied cardiovascular symptoms, arrhythmia, chest pain/pressure, edema, exercise intolerance, orthopnea and palpitations.  Respiratory: Denied pulmonary symptoms, asthma, pleuritic pain, productive sputum, cough, dyspnea and wheezing.  Gastrointestinal: Denied, gastro-esophageal reflux, melena, nausea and vomiting.  Genitourinary: Denied genitourinary symptoms including symptomatic vaginal discharge, pelvic relaxation issues, and urinary complaints.  Musculoskeletal: Denied musculoskeletal symptoms, stiffness, swelling, muscle weakness and myalgia.  Dermatologic: Denied dermatology symptoms, rash and scar.  Neurologic: Denied neurology symptoms, dizziness, headache, neck pain and syncope.  Psychiatric: Denied psychiatric symptoms, anxiety and depression.  Endocrine: Denied endocrine symptoms including hot flashes and night sweats.   Meds:   Current Outpatient Medications on File Prior to Visit  Medication Sig Dispense Refill  . acetaminophen (TYLENOL) 160 MG/5ML solution Take 5 mLs (160 mg total) by mouth every 6 (six) hours as needed for mild pain. 120 mL 6  . albuterol (PROVENTIL) (2.5 MG/3ML) 0.083% nebulizer solution Take 3 mLs (2.5 mg total) by nebulization 3 (three) times daily as needed for wheezing. 75 mL 12  . atorvastatin (LIPITOR) 40 MG tablet Take 40 mg by mouth daily.    Marland Kitchen buPROPion (WELLBUTRIN SR) 100 MG 12 hr tablet Take 1 tablet (100 mg total) by mouth 2 (two) times daily. 60 tablet 2  . cefUROXime (CEFTIN) 500 MG tablet Take 500 mg by mouth 2 (two) times daily with a meal.    . cholestyramine (QUESTRAN) 4 GM/DOSE powder TAKE 1 SCOOPFUL(4 GRAMS) MIXED IN 4-8 OUNCES OF LIQUID AND DRINK, DAILY 378 g 3  . cyclobenzaprine (FLEXERIL) 5 MG tablet Take 5 mg by mouth 3 (three) times daily as needed for muscle spasms.    . cycloSPORINE (RESTASIS) 0.05 % ophthalmic emulsion Place 1 drop into both eyes every 12 (twelve) hours.    Marland Kitchen  feeding  supplement, ENSURE ENLIVE, (ENSURE ENLIVE) LIQD Take 237 mLs by mouth 2 (two) times daily between meals. 237 mL 12  . fluticasone (FLONASE) 50 MCG/ACT nasal spray USE 1 SPRAY INTO EACH NOSTRIL TWICE DAILY 16 g 6  . hydrochlorothiazide (MICROZIDE) 12.5 MG capsule Take 12.5 mg by mouth daily.    Marland Kitchen HYDROcodone-acetaminophen (NORCO/VICODIN) 5-325 MG tablet Take 1 tablet by mouth every 6 (six) hours as needed for moderate pain. 15 tablet 0  . hydrOXYzine (ATARAX/VISTARIL) 25 MG tablet Take 25 mg by mouth 3 (three) times daily as needed.    . lipase/protease/amylase (CREON) 12000-38000 units CPEP capsule Take by mouth.    Marland Kitchen lisinopril (PRINIVIL,ZESTRIL) 40 MG tablet Take 40 mg by mouth daily.    . methocarbamol (ROBAXIN) 500 MG tablet Take 1 tablet (500 mg total) by mouth every 8 (eight) hours as needed for muscle spasms. 12 tablet 0  . mirtazapine (REMERON) 15 MG tablet Take 15 mg by mouth daily.    Marland Kitchen omeprazole (PRILOSEC) 20 MG capsule TAKE 1 CAPSULE BY MOUTH TWICE DAILY 180 capsule 0  . ondansetron (ZOFRAN ODT) 4 MG disintegrating tablet Take 1 tablet (4 mg total) by mouth every 8 (eight) hours as needed. 20 tablet 0  . pregabalin (LYRICA) 100 MG capsule Take 100 mg by mouth 3 (three) times daily.     . sertraline (ZOLOFT) 50 MG tablet TAKE 1 TABLET BY MOUTH TWICE DAILY 60 tablet 1  . SYMBICORT 160-4.5 MCG/ACT inhaler INHALE 1 PUFF BY MOUTH TWICE DAILY 10.2 g 1  . Vitamin D, Ergocalciferol, (DRISDOL) 50000 units CAPS capsule Take 50,000 Units by mouth every 7 (seven) days.    . Water For Irrigation, Sterile (FREE WATER) SOLN Place 60 mLs into feeding tube every 8 (eight) hours. 60 mL 0  . pantoprazole (PROTONIX) 40 MG tablet Take 1 tablet (40 mg total) by mouth daily. 30 tablet 1  . pregabalin (LYRICA) 50 MG capsule TAKE 1 CAPSULE BY MOUTH 3 TIMES DAILY 90 capsule 0   No current facility-administered medications on file prior to visit.          Objective:     Vitals:   06/10/20 0955  BP:  134/66  Pulse: 69   Filed Weights   06/10/20 0955  Weight: 92 lb 1.6 oz (41.8 kg)              CT reviewed.  Assessment:    [id number] Patient Active Problem List   Diagnosis Date Noted  . Fibroid 05/19/2020  . Intractable vomiting 04/09/2020  . Dehydration 04/09/2020  . Chest pain 04/08/2020  . Rotator cuff arthropathy of left shoulder 03/25/2020  . Cortical age-related cataract of both eyes 02/13/2020  . Adenocarcinoma of lung ([place]) 12/02/2019  . Essential hypertension 12/02/2019  . Adult failure to thrive 09/17/2019  . Cardiomyopathy ([place]) 09/17/2019  . E. coli gastroenteritis 09/09/2016  . Protein calorie malnutrition ([place]) 09/09/2016  . Tobacco abuse counseling 09/09/2016  . Hypokalemia 09/08/2016  . Ischemic bowel disease ([place]) 06/14/2014  . Intestinal angina ([place]) 06/14/2014  . COPD (chronic obstructive pulmonary disease) ([place]) 06/14/2014  . Protein-calorie malnutrition, severe ([place]) 05/27/2014  . Abdominal pain 05/26/2014     1. Fibroids     Patient has a very small uterine fibroid.  It is very unlikely that this is a source of pain or any other issues.  No risk of future malignancy or future increase in size.   Normal postmenopausal endometrial thickness by CT.   Plan:  1.  I have explained to the patient that this fibroid is benign and likely not the source of any of her problems or issues and is unlikely to cause any problems in the future. Orders No orders of the defined types were placed in this encounter.   No orders of the defined types were placed in this encounter.     F/U  No follow-ups on file. I spent 31 minutes involved in the care of this patient preparing to see the patient by obtaining and reviewing her medical history (including labs, imaging tests and prior procedures), documenting clinical information in the electronic health record (EHR), counseling and coordinating care plans, writing and sending prescriptions, ordering tests  or procedures and directly communicating with the patient by discussing pertinent items from her history and physical exam as well as detailing my assessment and plan as noted above so that she has an informed understanding.  All of her questions were answered.  Finis Bud, M.D. 06/10/2020 10:20 AM

## 2020-06-30 ENCOUNTER — Ambulatory Visit: Payer: Medicare Other | Admitting: Internal Medicine

## 2020-07-07 ENCOUNTER — Ambulatory Visit (INDEPENDENT_AMBULATORY_CARE_PROVIDER_SITE_OTHER): Payer: 59 | Admitting: Internal Medicine

## 2020-07-07 ENCOUNTER — Encounter: Payer: Self-pay | Admitting: Internal Medicine

## 2020-07-07 ENCOUNTER — Other Ambulatory Visit: Payer: Self-pay

## 2020-07-07 VITALS — BP 141/78 | HR 76 | Ht 65.0 in | Wt 90.2 lb

## 2020-07-07 DIAGNOSIS — I429 Cardiomyopathy, unspecified: Secondary | ICD-10-CM

## 2020-07-07 DIAGNOSIS — R079 Chest pain, unspecified: Secondary | ICD-10-CM | POA: Diagnosis not present

## 2020-07-07 DIAGNOSIS — J301 Allergic rhinitis due to pollen: Secondary | ICD-10-CM

## 2020-07-07 DIAGNOSIS — I10 Essential (primary) hypertension: Secondary | ICD-10-CM | POA: Diagnosis not present

## 2020-07-07 DIAGNOSIS — J410 Simple chronic bronchitis: Secondary | ICD-10-CM | POA: Diagnosis not present

## 2020-07-07 MED ORDER — PANTOPRAZOLE SODIUM 40 MG PO TBEC: 40.0000 mg | DELAYED_RELEASE_TABLET | Freq: Every day | ORAL | 1 refills | Status: DC

## 2020-07-07 MED ORDER — FLUTICASONE PROPIONATE 50 MCG/ACT NA SUSP: NASAL | 6 refills | Status: DC

## 2020-07-07 NOTE — Assessment & Plan Note (Signed)
Blood pressure is under control on present medication

## 2020-07-07 NOTE — Progress Notes (Signed)
Ekg

## 2020-07-07 NOTE — Addendum Note (Signed)
Addended by: Lacretia Nicks L on: 07/07/2020 11:05 AM   Modules accepted: Orders

## 2020-07-07 NOTE — Assessment & Plan Note (Signed)
Patient complains of atypical chest pain without any nausea vomiting or diarrhea.  she has a history of reflux esophagitis.  On physical examination heart is regular There is a soft systolic murmur noted at the sternal border Her pain does not radiate to neck shoulder or arm.  She was started on Protonix 40 mg daily also given Mylanta 30 cc.  In the office.  She was advised to go back to the hospital if she has no chest pain or return back to my office tomorrow for recheck.  We will also do a CBC and metabolic panel.

## 2020-07-07 NOTE — Progress Notes (Signed)
Established Patient Office Visit  Subjective:  Patient ID: [name] [name], female    DOB: [birth year]  Age: 65 y.o. MRN: [id number]  CC:  Chief Complaint  Patient presents with   Chest Pain    Patient having chest pain and sob since last week. Patient states that is has worsened since when it first started.    trouble swallowing    Patient describes a sand paper feeling in her throat when she swallows.     Chest Pain  This is a recurrent problem. The current episode started more than 1 year ago. The problem occurs 2 to 4 times per day. The problem has been waxing and waning. The pain is at a severity of 5/10. The pain is moderate. The quality of the pain is described as burning, squeezing and stabbing. The pain does not radiate. Associated symptoms include shortness of breath. Pertinent negatives include no back pain, dizziness, lower extremity edema, nausea, orthopnea, palpitations or sputum production. The pain is aggravated by deep breathing. She has tried antacids for the symptoms.   [name] [name] presents for   Past Medical History:  Diagnosis Date   Bowel obstruction ([place])    x1 month ago   Cancer [facility])    stomach cancer per pt and currently lung ca   COPD (chronic obstructive pulmonary disease) ([place])    Essential hypertension 12/02/2019   Lung cancer ([place])    Personal history of radiation therapy 2019-2020   lung ca    Past Surgical History:  Procedure Laterality Date   COLON SURGERY      Family History  Problem Relation Age of Onset   Lupus Sister    Heart failure Neg Hx    Diabetes Neg Hx     Social History   Socioeconomic History   Marital status: Widowed    Spouse name: Not on file   Number of children: Not on file   Years of education: Not on file   Highest education level: Not on file  Occupational History   Occupation: on disability  Tobacco Use   Smoking status: Every Day    Packs/day: 0.25    Pack years: 0.00    Types: Cigarettes   Smokeless  tobacco: Never  Vaping Use   Vaping Use: Never used  Substance and Sexual Activity   Alcohol use: Yes    Comment: occas   Drug use: No   Sexual activity: Yes    Birth control/protection: Post-menopausal  Other Topics Concern   Not on file  Social History Narrative   Not on file   Social Determinants of Health   Financial Resource Strain: Not on file  Food Insecurity: Not on file  Transportation Needs: Not on file  Physical Activity: Not on file  Stress: Not on file  Social Connections: Not on file  Intimate Partner Violence: Not on file     Current Outpatient Medications:    acetaminophen (TYLENOL) 160 MG/5ML solution, Take 5 mLs (160 mg total) by mouth every 6 (six) hours as needed for mild pain., Disp: 120 mL, Rfl: 6   albuterol (PROVENTIL) (2.5 MG/3ML) 0.083% nebulizer solution, Take 3 mLs (2.5 mg total) by nebulization 3 (three) times daily as needed for wheezing., Disp: 75 mL, Rfl: 12   atorvastatin (LIPITOR) 40 MG tablet, Take 40 mg by mouth daily., Disp: , Rfl:    buPROPion (WELLBUTRIN SR) 100 MG 12 hr tablet, Take 1 tablet (100 mg total) by mouth 2 (two) times daily.,  Disp: 60 tablet, Rfl: 2   cefUROXime (CEFTIN) 500 MG tablet, Take 500 mg by mouth 2 (two) times daily with a meal., Disp: , Rfl:    cholestyramine (QUESTRAN) 4 GM/DOSE powder, TAKE 1 SCOOPFUL(4 GRAMS) MIXED IN 4-8 OUNCES OF LIQUID AND DRINK, DAILY, Disp: 378 g, Rfl: 3   cyclobenzaprine (FLEXERIL) 5 MG tablet, Take 5 mg by mouth 3 (three) times daily as needed for muscle spasms., Disp: , Rfl:    cycloSPORINE (RESTASIS) 0.05 % ophthalmic emulsion, Place 1 drop into both eyes every 12 (twelve) hours., Disp: , Rfl:    feeding supplement, ENSURE ENLIVE, (ENSURE ENLIVE) LIQD, Take 237 mLs by mouth 2 (two) times daily between meals., Disp: 237 mL, Rfl: 12   hydrochlorothiazide (MICROZIDE) 12.5 MG capsule, Take 12.5 mg by mouth daily., Disp: , Rfl:    HYDROcodone-acetaminophen (NORCO/VICODIN) 5-325 MG tablet, Take 1  tablet by mouth every 6 (six) hours as needed for moderate pain., Disp: 15 tablet, Rfl: 0   hydrOXYzine (ATARAX/VISTARIL) 25 MG tablet, Take 25 mg by mouth 3 (three) times daily as needed., Disp: , Rfl:    lipase/protease/amylase (CREON) 12000-38000 units CPEP capsule, Take by mouth., Disp: , Rfl:    lisinopril (PRINIVIL,ZESTRIL) 40 MG tablet, Take 40 mg by mouth daily., Disp: , Rfl:    methocarbamol (ROBAXIN) 500 MG tablet, Take 1 tablet (500 mg total) by mouth every 8 (eight) hours as needed for muscle spasms., Disp: 12 tablet, Rfl: 0   mirtazapine (REMERON) 15 MG tablet, Take 15 mg by mouth daily., Disp: , Rfl:    omeprazole (PRILOSEC) 20 MG capsule, TAKE 1 CAPSULE BY MOUTH TWICE DAILY, Disp: 180 capsule, Rfl: 0   ondansetron (ZOFRAN ODT) 4 MG disintegrating tablet, Take 1 tablet (4 mg total) by mouth every 8 (eight) hours as needed., Disp: 20 tablet, Rfl: 0   pregabalin (LYRICA) 100 MG capsule, Take 100 mg by mouth 3 (three) times daily. , Disp: , Rfl:    pregabalin (LYRICA) 50 MG capsule, TAKE 1 CAPSULE BY MOUTH 3 TIMES DAILY, Disp: 90 capsule, Rfl: 0   sertraline (ZOLOFT) 50 MG tablet, TAKE 1 TABLET BY MOUTH TWICE DAILY, Disp: 60 tablet, Rfl: 1   SYMBICORT 160-4.5 MCG/ACT inhaler, INHALE 1 PUFF BY MOUTH TWICE DAILY, Disp: 10.2 g, Rfl: 1   Vitamin D, Ergocalciferol, (DRISDOL) 50000 units CAPS capsule, Take 50,000 Units by mouth every 7 (seven) days., Disp: , Rfl:    Water For Irrigation, Sterile (FREE WATER) SOLN, Place 60 mLs into feeding tube every 8 (eight) hours., Disp: 60 mL, Rfl: 0   fluticasone (FLONASE) 50 MCG/ACT nasal spray, USE 1 SPRAY INTO EACH NOSTRIL TWICE DAILY, Disp: 16 g, Rfl: 6   pantoprazole (PROTONIX) 40 MG tablet, Take 1 tablet (40 mg total) by mouth daily., Disp: 30 tablet, Rfl: 1   Allergies  Allergen Reactions   Aspirin Hives and Itching    ROS Review of Systems  Constitutional: Negative.   HENT: Negative.    Eyes: Negative.   Respiratory:  Positive for  shortness of breath. Negative for sputum production.   Cardiovascular:  Positive for chest pain. Negative for palpitations and orthopnea.  Gastrointestinal:  Positive for diarrhea. Negative for abdominal distention, blood in stool, constipation and nausea.  Endocrine: Negative.   Genitourinary: Negative.  Negative for dysuria.  Musculoskeletal: Negative.  Negative for back pain.  Skin: Negative.   Allergic/Immunologic: Negative.   Neurological: Negative.  Negative for dizziness and speech difficulty.  Hematological: Negative.   Psychiatric/Behavioral:  Negative.  Negative for agitation and behavioral problems.   All other systems reviewed and are negative.    Objective:    Physical Exam Cardiovascular:     Rate and Rhythm: Normal rate and regular rhythm.     Heart sounds: Murmur heard.  Systolic murmur is present with a grade of 2/6.    Friction rub present.  Musculoskeletal:     Right lower leg: No edema.     Left lower leg: No edema.    BP (!) 141/78   Pulse 76   Ht 5\' 5"  (1.651 m)   Wt 90 lb 3.2 oz (40.9 kg)   BMI 15.01 kg/m  Wt Readings from Last 3 Encounters:  07/07/20 90 lb 3.2 oz (40.9 kg)  06/10/20 92 lb 1.6 oz (41.8 kg)  05/19/20 89 lb 1.6 oz (40.4 kg)     Health Maintenance Due  Topic Date Due   Pneumococcal Vaccine 25-73 Years old (1 - PCV) Never done   Hepatitis C Screening  Never done   Zoster Vaccines- Shingrix (1 of 2) Never done   PAP SMEAR-Modifier  Never done   COLONOSCOPY (Pts 45-69yrs Insurance coverage will need to be confirmed)  Never done   COVID-19 Vaccine (4 - Booster for [place] series) 01/08/2020    There are no preventive care reminders to display for this patient.  No results found for: TSH Lab Results  Component Value Date   WBC 10.4 04/09/2020   HGB 13.5 04/09/2020   HCT 41.0 04/09/2020   MCV 103.3 (H) 04/09/2020   PLT 261 04/09/2020   Lab Results  Component Value Date   NA 141 04/09/2020   K 3.8 04/09/2020   CO2 23  04/09/2020   GLUCOSE 73 04/09/2020   BUN 11 04/09/2020   CREATININE 0.64 04/09/2020   BILITOT 0.7 04/09/2020   ALKPHOS 62 04/09/2020   AST 20 04/09/2020   ALT 23 04/09/2020   PROT 6.8 04/09/2020   ALBUMIN 4.0 04/09/2020   CALCIUM 8.8 (L) 04/09/2020   ANIONGAP 6 04/09/2020   No results found for: CHOL No results found for: HDL No results found for: [facility] Lab Results  Component Value Date   TRIG 107 05/30/2014   No results found for: CHOLHDL No results found for: HGBA1C    Assessment & Plan:  Report of the electrocardiogram. Electrocardiogram revealed normal sinus rhythm poor R wave progression V1 V2 V3. Nonspecific ST-T abnormalities are noted. Problem List Items Addressed This Visit       Cardiovascular and Mediastinum   Cardiomyopathy ([place])    She did not have any evidence of congestive heart failure       Essential hypertension    Blood pressure is under control on present medication         Respiratory   COPD (chronic obstructive pulmonary disease) ([place]) (Chronic)    Patient still is a smoker I have asked her to stop smoking completely       Relevant Medications   fluticasone (FLONASE) 50 MCG/ACT nasal spray     Other   Chest pain - Primary    Patient complains of atypical chest pain without any nausea vomiting or diarrhea.  she has a history of reflux esophagitis.  On physical examination heart is regular There is a soft systolic murmur noted at the sternal border Her pain does not radiate to neck shoulder or arm.  She was started on Protonix 40 mg daily also given Mylanta 30 cc.  In the office.  She was advised to go back to the hospital if she has no chest pain or return back to my office tomorrow for recheck.  We will also do a CBC and metabolic panel.       Relevant Medications   pantoprazole (PROTONIX) 40 MG tablet   Other Relevant Orders   EKG 12-Lead   Other Visit Diagnoses     Seasonal allergic rhinitis due to pollen       Relevant  Medications   fluticasone (FLONASE) 50 MCG/ACT nasal spray       Meds ordered this encounter  Medications   pantoprazole (PROTONIX) 40 MG tablet    Sig: Take 1 tablet (40 mg total) by mouth daily.    Dispense:  30 tablet    Refill:  1   fluticasone (FLONASE) 50 MCG/ACT nasal spray    Sig: USE 1 SPRAY INTO EACH NOSTRIL TWICE DAILY    Dispense:  16 g    Refill:  6    Follow-up: No follow-ups on file.    Cletis Athens, MD

## 2020-07-07 NOTE — Assessment & Plan Note (Signed)
She did not have any evidence of congestive heart failure

## 2020-07-07 NOTE — Assessment & Plan Note (Signed)
Patient still is a smoker I have asked her to stop smoking completely

## 2020-07-08 LAB — COMPLETE METABOLIC PANEL WITH GFR
AG Ratio: 1.8 (calc) (ref 1.0–2.5)
ALT: 18 U/L (ref 6–29)
AST: 21 U/L (ref 10–35)
Albumin: 4.4 g/dL (ref 3.6–5.1)
Alkaline phosphatase (APISO): 73 U/L (ref 37–153)
BUN: 13 mg/dL (ref 7–25)
CO2: 24 mmol/L (ref 20–32)
Calcium: 9.2 mg/dL (ref 8.6–10.4)
Chloride: 105 mmol/L (ref 98–110)
Creat: 0.54 mg/dL (ref 0.50–0.99)
GFR, Est African American: 116 mL/min/{1.73_m2} (ref >=60)
GFR, Est Non African American: 100 mL/min/{1.73_m2} (ref >=60)
Globulin: 2.4 g/dL (calc) (ref 1.9–3.7)
Glucose, Bld: 80 mg/dL (ref 65–99)
Potassium: 4.9 mmol/L (ref 3.5–5.3)
Sodium: 140 mmol/L (ref 135–146)
Total Bilirubin: 0.5 mg/dL (ref 0.2–1.2)
Total Protein: 6.8 g/dL (ref 6.1–8.1)

## 2020-07-08 LAB — CBC WITH DIFFERENTIAL/PLATELET
Absolute Monocytes: 469 cells/uL (ref 200–950)
Basophils Absolute: 28 cells/uL (ref 0–200)
Basophils Relative: 0.4 %
Eosinophils Absolute: 7 cells/uL — ABNORMAL LOW (ref 15–500)
Eosinophils Relative: 0.1 %
HCT: 41.6 % (ref 35.0–45.0)
Hemoglobin: 14 g/dL (ref 11.7–15.5)
Lymphs Abs: 2421 cells/uL (ref 850–3900)
MCH: 34 pg — ABNORMAL HIGH (ref 27.0–33.0)
MCHC: 33.7 g/dL (ref 32.0–36.0)
MCV: 101 fL — ABNORMAL HIGH (ref 80.0–100.0)
MPV: 11.3 fL (ref 7.5–12.5)
Monocytes Relative: 6.6 %
Neutro Abs: 4175 cells/uL (ref 1500–7800)
Neutrophils Relative %: 58.8 %
Platelets: 246 10*3/uL (ref 140–400)
RBC: 4.12 10*6/uL (ref 3.80–5.10)
RDW: 11.2 % (ref 11.0–15.0)
Total Lymphocyte: 34.1 %
WBC: 7.1 10*3/uL (ref 3.8–10.8)

## 2020-07-08 LAB — TSH: TSH: 0.4 mIU/L (ref 0.40–4.50)

## 2020-07-16 ENCOUNTER — Encounter: Payer: Self-pay | Admitting: Family Medicine

## 2020-07-16 ENCOUNTER — Ambulatory Visit (INDEPENDENT_AMBULATORY_CARE_PROVIDER_SITE_OTHER): Payer: 59 | Admitting: Family Medicine

## 2020-07-16 ENCOUNTER — Other Ambulatory Visit: Payer: Self-pay

## 2020-07-16 VITALS — BP 148/67 | HR 81 | Ht 65.0 in | Wt 91.4 lb

## 2020-07-16 DIAGNOSIS — R55 Syncope and collapse: Secondary | ICD-10-CM | POA: Diagnosis not present

## 2020-07-16 DIAGNOSIS — Z9181 History of falling: Secondary | ICD-10-CM | POA: Diagnosis not present

## 2020-07-16 LAB — POCT URINALYSIS DIPSTICK
Bilirubin, UA: NEGATIVE
Blood, UA: NEGATIVE
Glucose, UA: NEGATIVE
Ketones, UA: NEGATIVE
Leukocytes, UA: NEGATIVE
Nitrite, UA: NEGATIVE
Protein, UA: NEGATIVE
Spec Grav, UA: 1.02 (ref 1.010–1.025)
Urobilinogen, UA: 0.2 E.U./dL
pH, UA: 6 (ref 5.0–8.0)

## 2020-07-16 NOTE — Assessment & Plan Note (Signed)
Patient had syncope and collapse 5 days ago, says she does not remember falling but a friend found her after several minutes and she had vomited. Since the fall she has had some chest pressure but no SOB.  Plan- ECG borderline, Cardiology consult today, labs to check electrolytes, UA wnl.

## 2020-07-16 NOTE — Progress Notes (Signed)
Established Patient Office Visit  SUBJECTIVE:  Subjective  Patient ID: [name] [name], female    DOB: 08/05/1955  Age: 65 y.o. MRN: [id number]  CC:  Chief Complaint  Patient presents with   recent fall    Patient had fall at home on Sunday and now is having a lot of pain on left side.     HPI [name] [name] is a 65 y.o. female presenting today for     Past Medical History:  Diagnosis Date   Bowel obstruction ([place])    x1 month ago   Cancer [facility])    stomach cancer per pt and currently lung ca   COPD (chronic obstructive pulmonary disease) ([place])    Essential hypertension 12/02/2019   Lung cancer ([place])    Personal history of radiation therapy 2019-2020   lung ca    Past Surgical History:  Procedure Laterality Date   COLON SURGERY      Family History  Problem Relation Age of Onset   Lupus Sister    Heart failure Neg Hx    Diabetes Neg Hx     Social History   Socioeconomic History   Marital status: Widowed    Spouse name: Not on file   Number of children: Not on file   Years of education: Not on file   Highest education level: Not on file  Occupational History   Occupation: on disability  Tobacco Use   Smoking status: Every Day    Packs/day: 0.25    Pack years: 0.00    Types: Cigarettes   Smokeless tobacco: Never  Vaping Use   Vaping Use: Never used  Substance and Sexual Activity   Alcohol use: Yes    Comment: occas   Drug use: No   Sexual activity: Yes    Birth control/protection: Post-menopausal  Other Topics Concern   Not on file  Social History Narrative   Not on file   Social Determinants of Health   Financial Resource Strain: Not on file  Food Insecurity: Not on file  Transportation Needs: Not on file  Physical Activity: Not on file  Stress: Not on file  Social Connections: Not on file  Intimate Partner Violence: Not on file     Current Outpatient Medications:    acetaminophen (TYLENOL) 160 MG/5ML solution, Take 5 mLs (160 mg total)  by mouth every 6 (six) hours as needed for mild pain., Disp: 120 mL, Rfl: 6   albuterol (PROVENTIL) (2.5 MG/3ML) 0.083% nebulizer solution, Take 3 mLs (2.5 mg total) by nebulization 3 (three) times daily as needed for wheezing., Disp: 75 mL, Rfl: 12   atorvastatin (LIPITOR) 40 MG tablet, Take 40 mg by mouth daily., Disp: , Rfl:    buPROPion (WELLBUTRIN SR) 100 MG 12 hr tablet, Take 1 tablet (100 mg total) by mouth 2 (two) times daily., Disp: 60 tablet, Rfl: 2   cefUROXime (CEFTIN) 500 MG tablet, Take 500 mg by mouth 2 (two) times daily with a meal., Disp: , Rfl:    cholestyramine (QUESTRAN) 4 GM/DOSE powder, TAKE 1 SCOOPFUL(4 GRAMS) MIXED IN 4-8 OUNCES OF LIQUID AND DRINK, DAILY, Disp: 378 g, Rfl: 3   cyclobenzaprine (FLEXERIL) 5 MG tablet, Take 5 mg by mouth 3 (three) times daily as needed for muscle spasms., Disp: , Rfl:    cycloSPORINE (RESTASIS) 0.05 % ophthalmic emulsion, Place 1 drop into both eyes every 12 (twelve) hours., Disp: , Rfl:    feeding supplement, ENSURE ENLIVE, (ENSURE ENLIVE) LIQD, Take 237  mLs by mouth 2 (two) times daily between meals., Disp: 237 mL, Rfl: 12   fluticasone (FLONASE) 50 MCG/ACT nasal spray, USE 1 SPRAY INTO EACH NOSTRIL TWICE DAILY, Disp: 16 g, Rfl: 6   hydrochlorothiazide (MICROZIDE) 12.5 MG capsule, Take 12.5 mg by mouth daily., Disp: , Rfl:    HYDROcodone-acetaminophen (NORCO/VICODIN) 5-325 MG tablet, Take 1 tablet by mouth every 6 (six) hours as needed for moderate pain., Disp: 15 tablet, Rfl: 0   hydrOXYzine (ATARAX/VISTARIL) 25 MG tablet, Take 25 mg by mouth 3 (three) times daily as needed., Disp: , Rfl:    lipase/protease/amylase (CREON) 12000-38000 units CPEP capsule, Take by mouth., Disp: , Rfl:    lisinopril (PRINIVIL,ZESTRIL) 40 MG tablet, Take 40 mg by mouth daily., Disp: , Rfl:    methocarbamol (ROBAXIN) 500 MG tablet, Take 1 tablet (500 mg total) by mouth every 8 (eight) hours as needed for muscle spasms., Disp: 12 tablet, Rfl: 0   mirtazapine  (REMERON) 15 MG tablet, Take 15 mg by mouth daily., Disp: , Rfl:    omeprazole (PRILOSEC) 20 MG capsule, TAKE 1 CAPSULE BY MOUTH TWICE DAILY, Disp: 180 capsule, Rfl: 0   ondansetron (ZOFRAN ODT) 4 MG disintegrating tablet, Take 1 tablet (4 mg total) by mouth every 8 (eight) hours as needed., Disp: 20 tablet, Rfl: 0   pantoprazole (PROTONIX) 40 MG tablet, Take 1 tablet (40 mg total) by mouth daily., Disp: 30 tablet, Rfl: 1   pregabalin (LYRICA) 100 MG capsule, Take 100 mg by mouth 3 (three) times daily. , Disp: , Rfl:    pregabalin (LYRICA) 50 MG capsule, TAKE 1 CAPSULE BY MOUTH 3 TIMES DAILY, Disp: 90 capsule, Rfl: 0   sertraline (ZOLOFT) 50 MG tablet, TAKE 1 TABLET BY MOUTH TWICE DAILY, Disp: 60 tablet, Rfl: 1   SYMBICORT 160-4.5 MCG/ACT inhaler, INHALE 1 PUFF BY MOUTH TWICE DAILY, Disp: 10.2 g, Rfl: 1   Vitamin D, Ergocalciferol, (DRISDOL) 50000 units CAPS capsule, Take 50,000 Units by mouth every 7 (seven) days., Disp: , Rfl:    Water For Irrigation, Sterile (FREE WATER) SOLN, Place 60 mLs into feeding tube every 8 (eight) hours., Disp: 60 mL, Rfl: 0   Allergies  Allergen Reactions   Aspirin Hives and Itching    ROS Review of Systems  Constitutional: Negative.   HENT: Negative.    Respiratory: Negative.    Cardiovascular: Negative.   Neurological:  Positive for light-headedness.  Psychiatric/Behavioral: Negative.      OBJECTIVE:    Physical Exam Vitals reviewed.  Constitutional:      Appearance: Normal appearance.  HENT:     Head: Normocephalic.  Cardiovascular:     Rate and Rhythm: Normal rate and regular rhythm.  Abdominal:     General: Abdomen is flat.  Musculoskeletal:        General: Normal range of motion.  Psychiatric:        Mood and Affect: Mood normal.    BP (!) 148/67   Pulse 81   Ht 5\' 5"  (1.651 m)   Wt 91 lb 6.4 oz (41.5 kg)   BMI 15.21 kg/m  Wt Readings from Last 3 Encounters:  07/16/20 91 lb 6.4 oz (41.5 kg)  07/07/20 90 lb 3.2 oz (40.9 kg)   06/10/20 92 lb 1.6 oz (41.8 kg)    Health Maintenance Due  Topic Date Due   Pneumococcal Vaccine 10-40 Years old (1 - PCV) Never done   Hepatitis C Screening  Never done   Zoster Vaccines- Shingrix (1  of 2) Never done   PAP SMEAR-Modifier  Never done   COLONOSCOPY (Pts 45-48yrs Insurance coverage will need to be confirmed)  Never done   COVID-19 Vaccine (4 - Booster for [place] series) 01/08/2020    There are no preventive care reminders to display for this patient.  CBC Latest Ref Rng & Units 07/07/2020 04/09/2020 03/20/2020  WBC 3.8 - 10.8 Thousand/uL 7.1 10.4 13.4(H)  Hemoglobin 11.7 - 15.5 g/dL 14.0 13.5 12.1  Hematocrit 35.0 - 45.0 % 41.6 41.0 36.8  Platelets 140 - 400 Thousand/uL 246 261 284   CMP Latest Ref Rng & Units 07/07/2020 04/09/2020 03/20/2020  Glucose 65 - 99 mg/dL 80 73 90  BUN 7 - 25 mg/dL 13 11 9   Creatinine 0.50 - 0.99 mg/dL 0.54 0.64 0.45  Sodium 135 - 146 mmol/L 140 141 140  Potassium 3.5 - 5.3 mmol/L 4.9 3.8 3.6  Chloride 98 - 110 mmol/L 105 112(H) 112(H)  CO2 20 - 32 mmol/L 24 23 23   Calcium 8.6 - 10.4 mg/dL 9.2 8.8(L) 8.5(L)  Total Protein 6.1 - 8.1 g/dL 6.8 6.8 6.6  Total Bilirubin 0.2 - 1.2 mg/dL 0.5 0.7 0.6  Alkaline Phos 38 - 126 U/L - 62 65  AST 10 - 35 U/L 21 20 18   ALT 6 - 29 U/L 18 23 16     Lab Results  Component Value Date   TSH 0.40 07/07/2020   Lab Results  Component Value Date   ALBUMIN 4.0 04/09/2020   ANIONGAP 6 04/09/2020   No results found for: CHOL, HDL, LDLCALC, CHOLHDL Lab Results  Component Value Date   TRIG 107 05/30/2014   No results found for: HGBA1C    ASSESSMENT & PLAN:   Problem List Items Addressed This Visit       Other   History of recent fall - Primary   Relevant Orders   EKG 12-Lead   POCT urinalysis dipstick (Completed)   Syncope and collapse    Patient had syncope and collapse 5 days ago, says she does not remember falling but a friend found her after several minutes and she had vomited. Since the  fall she has had some chest pressure but no SOB.  Plan- ECG borderline, Cardiology consult today, labs to check electrolytes, UA wnl.         No orders of the defined types were placed in this encounter.     Follow-up: No follow-ups on file.    Beckie Salts, [place] [address], [place], [place] [address]

## 2020-07-17 LAB — COMPLETE METABOLIC PANEL WITH GFR
AG Ratio: 1.8 (calc) (ref 1.0–2.5)
ALT: 14 U/L (ref 6–29)
AST: 15 U/L (ref 10–35)
Albumin: 4.1 g/dL (ref 3.6–5.1)
Alkaline phosphatase (APISO): 78 U/L (ref 37–153)
BUN: 16 mg/dL (ref 7–25)
CO2: 25 mmol/L (ref 20–32)
Calcium: 9 mg/dL (ref 8.6–10.4)
Chloride: 106 mmol/L (ref 98–110)
Creat: 0.7 mg/dL (ref 0.50–0.99)
GFR, Est African American: 107 mL/min/{1.73_m2} (ref >=60)
GFR, Est Non African American: 92 mL/min/{1.73_m2} (ref >=60)
Globulin: 2.3 g/dL (calc) (ref 1.9–3.7)
Glucose, Bld: 92 mg/dL (ref 65–99)
Potassium: 5.3 mmol/L (ref 3.5–5.3)
Sodium: 139 mmol/L (ref 135–146)
Total Bilirubin: 0.3 mg/dL (ref 0.2–1.2)
Total Protein: 6.4 g/dL (ref 6.1–8.1)

## 2020-07-18 ENCOUNTER — Other Ambulatory Visit: Payer: Self-pay | Admitting: Internal Medicine

## 2020-07-20 ENCOUNTER — Ambulatory Visit: Payer: Medicare Other | Admitting: Internal Medicine

## 2020-07-31 ENCOUNTER — Other Ambulatory Visit: Payer: Self-pay

## 2020-07-31 ENCOUNTER — Other Ambulatory Visit: Payer: Self-pay | Admitting: Internal Medicine

## 2020-07-31 MED ORDER — BUDESONIDE-FORMOTEROL FUMARATE 160-4.5 MCG/ACT IN AERO: 1.0000 | INHALATION_SPRAY | Freq: Two times a day (BID) | RESPIRATORY_TRACT | 3 refills | Status: DC

## 2020-08-04 ENCOUNTER — Emergency Department
Admission: EM | Admit: 2020-08-04 | Discharge: 2020-08-04 | Disposition: A | Discharge status: Home / Self Care | Payer: 59 | Attending: Emergency Medicine | Admitting: Emergency Medicine

## 2020-08-04 ENCOUNTER — Other Ambulatory Visit: Payer: Self-pay

## 2020-08-04 ENCOUNTER — Emergency Department: Payer: 59

## 2020-08-04 DIAGNOSIS — Z85828 Personal history of other malignant neoplasm of skin: Secondary | ICD-10-CM | POA: Diagnosis not present

## 2020-08-04 DIAGNOSIS — Z7952 Long term (current) use of systemic steroids: Secondary | ICD-10-CM | POA: Diagnosis not present

## 2020-08-04 DIAGNOSIS — Z85118 Personal history of other malignant neoplasm of bronchus and lung: Secondary | ICD-10-CM | POA: Insufficient documentation

## 2020-08-04 DIAGNOSIS — R0789 Other chest pain: Secondary | ICD-10-CM | POA: Diagnosis present

## 2020-08-04 DIAGNOSIS — R918 Other nonspecific abnormal finding of lung field: Secondary | ICD-10-CM

## 2020-08-04 DIAGNOSIS — F17210 Nicotine dependence, cigarettes, uncomplicated: Secondary | ICD-10-CM | POA: Insufficient documentation

## 2020-08-04 DIAGNOSIS — Z79899 Other long term (current) drug therapy: Secondary | ICD-10-CM | POA: Insufficient documentation

## 2020-08-04 DIAGNOSIS — J441 Chronic obstructive pulmonary disease with (acute) exacerbation: Secondary | ICD-10-CM | POA: Diagnosis not present

## 2020-08-04 DIAGNOSIS — I10 Essential (primary) hypertension: Secondary | ICD-10-CM | POA: Insufficient documentation

## 2020-08-04 LAB — BASIC METABOLIC PANEL
Anion gap: 4 — ABNORMAL LOW (ref 5–15)
BUN: 17 mg/dL (ref 8–23)
CO2: 26 mmol/L (ref 22–32)
Calcium: 8.4 mg/dL — ABNORMAL LOW (ref 8.9–10.3)
Chloride: 108 mmol/L (ref 98–111)
Creatinine, Ser: 0.54 mg/dL (ref 0.44–1.00)
GFR, Estimated: >60 mL/min (ref >60)
Glucose, Bld: 103 mg/dL — ABNORMAL HIGH (ref 70–99)
Potassium: 4.5 mmol/L (ref 3.5–5.1)
Sodium: 138 mmol/L (ref 135–145)

## 2020-08-04 LAB — CBC
HCT: 38 % (ref 36.0–46.0)
Hemoglobin: 12.7 g/dL (ref 12.0–15.0)
MCH: 34.7 pg — ABNORMAL HIGH (ref 26.0–34.0)
MCHC: 33.4 g/dL (ref 30.0–36.0)
MCV: 103.8 fL — ABNORMAL HIGH (ref 80.0–100.0)
Platelets: 272 10*3/uL (ref 150–400)
RBC: 3.66 MIL/uL — ABNORMAL LOW (ref 3.87–5.11)
RDW: 11.7 % (ref 11.5–15.5)
WBC: 10.7 10*3/uL — ABNORMAL HIGH (ref 4.0–10.5)
nRBC: 0 % (ref 0.0–0.2)

## 2020-08-04 LAB — TROPONIN I (HIGH SENSITIVITY)
Troponin I (High Sensitivity): 8 ng/L (ref <18)
Troponin I (High Sensitivity): 7 ng/L (ref <18)

## 2020-08-04 MED ORDER — IOHEXOL 350 MG/ML SOLN
75.0000 mL | Freq: Once | INTRAVENOUS | Status: AC | PRN
  Administered 2020-08-04: 75 mL via INTRAVENOUS

## 2020-08-04 MED ORDER — PREDNISONE 20 MG PO TABS
60.0000 mg | ORAL_TABLET | Freq: Once | ORAL | Status: AC
  Administered 2020-08-04: 60 mg via ORAL
  Filled 2020-08-04: qty 3

## 2020-08-04 MED ORDER — IPRATROPIUM-ALBUTEROL 0.5-2.5 (3) MG/3ML IN SOLN
3.0000 mL | Freq: Once | RESPIRATORY_TRACT | Status: AC
  Administered 2020-08-04: 3 mL via RESPIRATORY_TRACT
  Filled 2020-08-04: qty 3

## 2020-08-04 MED ORDER — PREDNISONE 10 MG PO TABS: 10.0000 mg | ORAL_TABLET | Freq: Every day | ORAL | 0 refills | Status: DC

## 2020-08-04 NOTE — ED Triage Notes (Signed)
Pt states that she has had left sided chest pain for aprox 1 month that has been sharp and constant, pt states pain makes her short of breath, dizzy and lightheaded. Pt has hx lung cx, is currently in remission.

## 2020-08-04 NOTE — ED Notes (Signed)
Pt to ct 

## 2020-08-04 NOTE — ED Provider Notes (Signed)
[facility] Emergency Department Provider Note ____________________________________________   Event Date/Time   First MD Initiated Contact with Patient 08/04/20 [phone]     (approximate)  I have reviewed the triage vital signs and the nursing notes.  HISTORY  Chief Complaint Chest Pain   HPI [name] [name] is a 65 y.o. femalewho presents to the ED for evaluation of chronic chest pain.   Chart review indicates history of COPD and continued cigarette smoking.  Lung cancer s/p radiation a couple years ago.  Patient presents to the ED for evaluation of chronic intermittent chest discomfort.  She reports daily intermittent chest pains, lasting a matter of minutes before self resolving, over the past 1-2 months.  She reports concomitant shortness of breath without productive cough or hemoptysis.  Denies fevers or syncopal episodes.  Denies abdominal pain or emesis.  She reports awakening early this morning with the symptoms and wanted to get checked out.  She reports that she has inhalers at home that she uses occasionally that transiently improve her symptoms.   Past Medical History:  Diagnosis Date   Bowel obstruction ([place])    x1 month ago   Cancer ([place])    stomach cancer per pt and currently lung ca   COPD (chronic obstructive pulmonary disease) ([place])    Essential hypertension 12/02/2019   Lung cancer ([place])    Personal history of radiation therapy 2019-2020   lung ca    Patient Active Problem List   Diagnosis Date Noted   History of recent fall 07/16/2020   Syncope and collapse 07/16/2020   Fibroid 05/19/2020   Intractable vomiting 04/09/2020   Dehydration 04/09/2020   Chest pain 04/08/2020   Rotator cuff arthropathy of left shoulder 03/25/2020   Cortical age-related cataract of both eyes 02/13/2020   Adenocarcinoma of lung ([place]) 12/02/2019   Essential hypertension 12/02/2019   Adult failure to thrive 09/17/2019   Cardiomyopathy ([place]) 09/17/2019    E. coli gastroenteritis 09/09/2016   Protein calorie malnutrition ([place]) 09/09/2016   Tobacco abuse counseling 09/09/2016   Hypokalemia 09/08/2016   Ischemic bowel disease ([place]) 06/14/2014   Intestinal angina ([place]) 06/14/2014   COPD (chronic obstructive pulmonary disease) ([place]) 06/14/2014   Protein-calorie malnutrition, severe ([place]) 05/27/2014   Abdominal pain 05/26/2014    Past Surgical History:  Procedure Laterality Date   COLON SURGERY      Prior to Admission medications   Medication Sig Start Date End Date Taking? Authorizing Provider  acetaminophen (TYLENOL) 160 MG/5ML solution Take 5 mLs (160 mg total) by mouth every 6 (six) hours as needed for mild pain. 09/17/19   Cletis Athens, MD  albuterol (PROVENTIL) (2.5 MG/3ML) 0.083% nebulizer solution Take 3 mLs (2.5 mg total) by nebulization 3 (three) times daily as needed for wheezing. 09/09/16   Theodoro Grist, MD  atorvastatin (LIPITOR) 40 MG tablet Take 40 mg by mouth daily.    [provider]  budesonide-formoterol (SYMBICORT) 160-4.5 MCG/ACT inhaler Inhale 1 puff into the lungs 2 (two) times daily. 07/31/20   Cletis Athens, MD  buPROPion (WELLBUTRIN SR) 100 MG 12 hr tablet Take 1 tablet (100 mg total) by mouth 2 (two) times daily. 09/17/19   Cletis Athens, MD  cefUROXime (CEFTIN) 500 MG tablet Take 500 mg by mouth 2 (two) times daily with a meal.    [provider]  cholestyramine (QUESTRAN) 4 GM/DOSE powder TAKE 1 SCOOPFUL(4 GRAMS) MIXED IN 4-8 OUNCES OF LIQUID AND DRINK, DAILY 07/20/20   Cletis Athens, MD  cyclobenzaprine (FLEXERIL) 5 MG tablet Take 5 mg by mouth 3 (three) times daily as needed for muscle spasms.    [provider]  cycloSPORINE (RESTASIS) 0.05 % ophthalmic emulsion Place 1 drop into both eyes every 12 (twelve) hours.    [provider]  feeding supplement, ENSURE ENLIVE, (ENSURE ENLIVE) LIQD Take 237 mLs by mouth 2 (two) times daily between meals. 09/10/16   Theodoro Grist, MD   fluticasone (FLONASE) 50 MCG/ACT nasal spray USE 1 SPRAY INTO EACH NOSTRIL TWICE DAILY 07/07/20   Cletis Athens, MD  hydrochlorothiazide (MICROZIDE) 12.5 MG capsule Take 12.5 mg by mouth daily.    [provider]  HYDROcodone-acetaminophen (NORCO/VICODIN) 5-325 MG tablet Take 1 tablet by mouth every 6 (six) hours as needed for moderate pain. 08/30/19   Johnn Hai, PA-C  hydrOXYzine (ATARAX/VISTARIL) 25 MG tablet Take 25 mg by mouth 3 (three) times daily as needed.    [provider]  lipase/protease/amylase (CREON) 12000-38000 units CPEP capsule Take by mouth.    [provider]  lisinopril (PRINIVIL,ZESTRIL) 40 MG tablet Take 40 mg by mouth daily.    [provider]  methocarbamol (ROBAXIN) 500 MG tablet Take 1 tablet (500 mg total) by mouth every 8 (eight) hours as needed for muscle spasms. 08/30/19   Johnn Hai, PA-C  mirtazapine (REMERON) 15 MG tablet Take 15 mg by mouth daily.    [provider]  omeprazole (PRILOSEC) 20 MG capsule TAKE 1 CAPSULE BY MOUTH TWICE DAILY 07/20/20   Cletis Athens, MD  ondansetron (ZOFRAN ODT) 4 MG disintegrating tablet Take 1 tablet (4 mg total) by mouth every 8 (eight) hours as needed. 07/03/19   Rudene Re, MD  pantoprazole (PROTONIX) 40 MG tablet Take 1 tablet (40 mg total) by mouth daily. 07/07/20 07/07/21  Cletis Athens, MD  pregabalin (LYRICA) 100 MG capsule Take 100 mg by mouth 3 (three) times daily.     [provider]  pregabalin (LYRICA) 50 MG capsule TAKE 1 CAPSULE BY MOUTH 3 TIMES DAILY 06/27/19   Cletis Athens, MD  sertraline (ZOLOFT) 50 MG tablet TAKE 1 TABLET BY MOUTH TWICE DAILY 03/05/20   Beckie Salts, FNP  Vitamin D, Ergocalciferol, (DRISDOL) 1.25 MG (50000 UNIT) CAPS capsule TAKE 1 CAPSULE BY MOUTH WEEKLY 07/31/20   Cletis Athens, MD  Water For Irrigation, Sterile (FREE WATER) SOLN Place 60 mLs into feeding tube every 8 (eight) hours. 05/29/14   Fritzi Mandes, MD     Allergies Aspirin  Family History  Problem Relation Age of Onset   Lupus Sister    Heart failure Neg Hx    Diabetes Neg Hx     Social History Social History   Tobacco Use   Smoking status: Every Day    Packs/day: 0.25    Pack years: 0.00    Types: Cigarettes   Smokeless tobacco: Never  Vaping Use   Vaping Use: Never used  Substance Use Topics   Alcohol use: Yes    Comment: occas   Drug use: No    Review of Systems  Constitutional: No fever/chills Eyes: No visual changes. ENT: No sore throat. Cardiovascular: Positive for chest pain. Respiratory: Positive for shortness of breath. Gastrointestinal: No abdominal pain.  No nausea, no vomiting.  No diarrhea.  No constipation. Genitourinary: Negative for dysuria. Musculoskeletal: Negative for back pain. Skin: Negative for rash. Neurological: Negative for headaches, focal weakness or numbness.   ____________________________________________   PHYSICAL EXAM:  VITAL SIGNS: Vitals:   08/04/20 1884  BP: (!) 178/78  Pulse: 68  Resp: 16  Temp: 98.5 F (36.9 C)  SpO2: 97%     Constitutional: Alert and oriented. Well appearing and in no acute distress. Eyes: Conjunctivae are normal. PERRL. EOMI. Head: Atraumatic. Nose: No congestion/rhinnorhea. Mouth/Throat: Mucous membranes are moist.  Oropharynx non-erythematous. Neck: No stridor. No cervical spine tenderness to palpation. Cardiovascular: Normal rate, regular rhythm. Grossly normal heart sounds.  Good peripheral circulation. Respiratory: Normal respiratory effort.  No retractions.  Diffuse and scattered expiratory wheezes and squeaky expiratory breath sounds.  Slight decrease in air movement throughout.  Decreased breath sounds to the right upper lobe. Gastrointestinal: Soft , nondistended, nontender to palpation. No CVA tenderness. Musculoskeletal: No lower extremity tenderness nor edema.  No joint effusions. No signs of acute trauma. Neurologic:  Normal  speech and language. No gross focal neurologic deficits are appreciated. No gait instability noted. Skin:  Skin is warm, dry and intact. No rash noted. Psychiatric: Mood and affect are normal. Speech and behavior are normal.  ____________________________________________   LABS (all labs ordered are listed, but only abnormal results are displayed)  Labs Reviewed  BASIC METABOLIC PANEL - Abnormal; Notable for the following components:      Result Value   Glucose, Bld 103 (*)    Calcium 8.4 (*)    Anion gap 4 (*)    All other components within normal limits  CBC - Abnormal; Notable for the following components:   WBC 10.7 (*)    RBC 3.66 (*)    MCV 103.8 (*)    MCH 34.7 (*)    All other components within normal limits  TROPONIN I (HIGH SENSITIVITY)  TROPONIN I (HIGH SENSITIVITY)   ____________________________________________  12 Lead EKG  Sinus rhythm, rate of 72 bpm.  Wandering baseline to anterior and septal leads making evaluation difficult to those leads.  Normal axis.  Normal intervals.  Unable to evaluate V2.  V3 seem to have J-point elevation, but only on one lead and no reciprocal depression.  No STEMI criteria. ____________________________________________  RADIOLOGY  ED MD interpretation: 1 view CXR reviewed by me with spiculated RUL mass, but no lobar infiltration.  No PTX.  Official radiology report(s): DG Chest Portable 1 View  Result Date: 08/04/2020 CLINICAL DATA:  65 year old female with history of chest pain. EXAM: PORTABLE CHEST 1 VIEW COMPARISON:  Chest x-ray 03/20/2020. FINDINGS: Lungs are hyperexpanded with emphysematous changes. Irregular spiculated mass-like area of architectural distortion in the right upper lobe is more prominent than prior examination. This is associated with upward retraction of the minor fissure indicative of associated upper lobe volume loss. There is also an irregular nodular opacity in the periphery of the left upper lobe near the  apex which too seems more prominent than the prior examination. No new acute consolidative airspace disease. No pleural effusions. No pneumothorax. No evidence of pulmonary edema. Heart size is normal. Upper mediastinal contours are within normal limits. Atherosclerotic calcifications in the thoracic aorta. IMPRESSION: 1. Worsening upper lung mass-like and nodular architectural distortion. The possibility of progressive neoplasm and/or progressive atypical infection warrants strong consideration. Further evaluation with contrast enhanced chest CT is strongly recommended at this time. 2. Aortic atherosclerosis. 3. Emphysema. Electronically Signed   By: Vinnie Langton M.D.   On: 08/04/2020 06:23    ____________________________________________   PROCEDURES and INTERVENTIONS  Procedure(s) performed (including Critical Care):  .1-3 Lead EKG Interpretation  Date/Time: 08/04/2020 6:55 AM Performed by: Vladimir Crofts, MD Authorized by: Vladimir Crofts, MD  Interpretation: normal     ECG rate:  70   ECG rate assessment: normal     Rhythm: sinus rhythm     Ectopy: none     Conduction: normal    Medications  ipratropium-albuterol (DUONEB) 0.5-2.5 (3) MG/3ML nebulizer solution 3 mL (3 mLs Nebulization Given 08/04/20 0615)  predniSONE (DELTASONE) tablet 60 mg (60 mg Oral Given 08/04/20 0615)  iohexol (OMNIPAQUE) 350 MG/ML injection 75 mL (75 mLs Intravenous Contrast Given 08/04/20 0646)    ____________________________________________   MDM / ED COURSE   65 year old woman with history of lung cancer and COPD presents to the ED with chest pain and shortness of breath, with evidence of COPD exacerbation and enlarging lung mass to her RUL lung.  Not hypoxic, but noted to be wheezing with decreased air movement throughout on pulmonary auscultation, consistent with COPD exacerbation.  EKG is nonischemic and her troponin is negative, without evidence of ACS.  CXR without infiltrates or PTX, but does  demonstrate RUL spiculated mass.  Concerning for recurrence of her lung cancer and possibly pulmonary embolus.  CTA chest and second troponin is pending the time of signout to oncoming provider.  Disposition per clinical reassessment and these further diagnostics     ____________________________________________   FINAL CLINICAL IMPRESSION(S) / ED DIAGNOSES  Final diagnoses:  COPD exacerbation ([place])  Other chest pain  Lung mass     ED Discharge Orders     None        Shann Merrick   Note:  This document was prepared using Dragon voice recognition software and may include unintentional dictation errors.    Vladimir Crofts, MD 08/04/20 [phone]

## 2020-08-04 NOTE — ED Provider Notes (Signed)
-----------------------------------------   8:23 AM on 08/04/2020 ----------------------------------------- Patient care assumed from Dr. Tamala Julian.  Patient CTA is negative for PE does show signs of significant scarring cannot rule out underlying neoplasm, discussed with the patient follow-up with her PCP to plan a PET CT in the future.  Repeat troponin remains negative.  Given the patient's reassuring work-up I believe the patient would be safe for discharge home with outpatient follow-up, steroids to treat her COPD exacerbation.  Patient is to follow-up with her PCP.   Harvest Dark, MD 08/04/20 [phone]

## 2020-08-04 NOTE — Discharge Instructions (Addendum)
You have been seen in the emergency department for likely COPD exacerbation.  Please take your steroids as prescribed for their entire course.  Your CT scan does show inflammatory changes of the upper lobes in both lungs.  As we discussed we recommend following up with your primary care doctor to arrange for a PET CT scan in the near future to further evaluate.  Return to the emergency department for any trouble breathing, chest pain or any other symptom personally concerning to yourself.

## 2020-08-04 NOTE — ED Notes (Addendum)
Pt back from CT. Repeat EKG obtained

## 2020-08-11 ENCOUNTER — Encounter: Payer: Self-pay | Admitting: Internal Medicine

## 2020-08-11 ENCOUNTER — Other Ambulatory Visit: Payer: Self-pay

## 2020-08-11 ENCOUNTER — Ambulatory Visit (INDEPENDENT_AMBULATORY_CARE_PROVIDER_SITE_OTHER): Payer: 59 | Admitting: Internal Medicine

## 2020-08-11 VITALS — BP 130/70 | HR 82 | Ht 65.0 in | Wt 93.1 lb

## 2020-08-11 DIAGNOSIS — I429 Cardiomyopathy, unspecified: Secondary | ICD-10-CM | POA: Diagnosis not present

## 2020-08-11 DIAGNOSIS — J410 Simple chronic bronchitis: Secondary | ICD-10-CM | POA: Diagnosis not present

## 2020-08-11 DIAGNOSIS — C3490 Malignant neoplasm of unspecified part of unspecified bronchus or lung: Secondary | ICD-10-CM

## 2020-08-11 DIAGNOSIS — K559 Vascular disorder of intestine, unspecified: Secondary | ICD-10-CM

## 2020-08-11 DIAGNOSIS — R627 Adult failure to thrive: Secondary | ICD-10-CM

## 2020-08-11 DIAGNOSIS — Z716 Tobacco abuse counseling: Secondary | ICD-10-CM

## 2020-08-11 NOTE — Assessment & Plan Note (Signed)
Patient is under care of oncology

## 2020-08-11 NOTE — Assessment & Plan Note (Signed)
Denies shortness of breath syncope or palpitation

## 2020-08-11 NOTE — Assessment & Plan Note (Signed)
Stable at the present time.  She has a short gut syndrome

## 2020-08-11 NOTE — Progress Notes (Signed)
Established Patient Office Visit  Subjective:  Patient ID: [name] [name], female    DOB: [birth year]  Age: 65 y.o. MRN: [id number]  CC:  Chief Complaint  Patient presents with   Follow-up    Patient went to ED on 08/04/20 with chest pain, patient has hx of lung cancer and also has COPD. PHQ-9 was done today. Please address.    HPI  [name] [name] presents for general check up  Past Medical History:  Diagnosis Date   Bowel obstruction ([place])    x1 month ago   Cancer [facility])    stomach cancer per pt and currently lung ca   COPD (chronic obstructive pulmonary disease) ([place])    Essential hypertension 12/02/2019   Lung cancer ([place])    Personal history of radiation therapy 2019-2020   lung ca    Past Surgical History:  Procedure Laterality Date   COLON SURGERY      Family History  Problem Relation Age of Onset   Lupus Sister    Heart failure Neg Hx    Diabetes Neg Hx     Social History   Socioeconomic History   Marital status: Widowed    Spouse name: Not on file   Number of children: Not on file   Years of education: Not on file   Highest education level: Not on file  Occupational History   Occupation: on disability  Tobacco Use   Smoking status: Every Day    Packs/day: 0.25    Types: Cigarettes   Smokeless tobacco: Never  Vaping Use   Vaping Use: Never used  Substance and Sexual Activity   Alcohol use: Yes    Comment: occas   Drug use: No   Sexual activity: Yes    Birth control/protection: Post-menopausal  Other Topics Concern   Not on file  Social History Narrative   Not on file   Social Determinants of Health   Financial Resource Strain: Not on file  Food Insecurity: Not on file  Transportation Needs: Not on file  Physical Activity: Not on file  Stress: Not on file  Social Connections: Not on file  Intimate Partner Violence: Not on file     Current Outpatient Medications:    acetaminophen (TYLENOL) 160 MG/5ML solution, Take 5 mLs (160 mg  total) by mouth every 6 (six) hours as needed for mild pain., Disp: 120 mL, Rfl: 6   albuterol (PROVENTIL) (2.5 MG/3ML) 0.083% nebulizer solution, Take 3 mLs (2.5 mg total) by nebulization 3 (three) times daily as needed for wheezing., Disp: 75 mL, Rfl: 12   atorvastatin (LIPITOR) 40 MG tablet, Take 40 mg by mouth daily., Disp: , Rfl:    budesonide-formoterol (SYMBICORT) 160-4.5 MCG/ACT inhaler, Inhale 1 puff into the lungs 2 (two) times daily., Disp: 10.2 g, Rfl: 3   buPROPion (WELLBUTRIN SR) 100 MG 12 hr tablet, Take 1 tablet (100 mg total) by mouth 2 (two) times daily., Disp: 60 tablet, Rfl: 2   cefUROXime (CEFTIN) 500 MG tablet, Take 500 mg by mouth 2 (two) times daily with a meal., Disp: , Rfl:    cholestyramine (QUESTRAN) 4 GM/DOSE powder, TAKE 1 SCOOPFUL(4 GRAMS) MIXED IN 4-8 OUNCES OF LIQUID AND DRINK, DAILY, Disp: 368.76 g, Rfl: 3   cyclobenzaprine (FLEXERIL) 5 MG tablet, Take 5 mg by mouth 3 (three) times daily as needed for muscle spasms., Disp: , Rfl:    cycloSPORINE (RESTASIS) 0.05 % ophthalmic emulsion, Place 1 drop into both eyes every 12 (twelve) hours., Disp: ,  Rfl:    feeding supplement, ENSURE ENLIVE, (ENSURE ENLIVE) LIQD, Take 237 mLs by mouth 2 (two) times daily between meals., Disp: 237 mL, Rfl: 12   fluticasone (FLONASE) 50 MCG/ACT nasal spray, USE 1 SPRAY INTO EACH NOSTRIL TWICE DAILY, Disp: 16 g, Rfl: 6   hydrochlorothiazide (MICROZIDE) 12.5 MG capsule, Take 12.5 mg by mouth daily., Disp: , Rfl:    HYDROcodone-acetaminophen (NORCO/VICODIN) 5-325 MG tablet, Take 1 tablet by mouth every 6 (six) hours as needed for moderate pain., Disp: 15 tablet, Rfl: 0   hydrOXYzine (ATARAX/VISTARIL) 25 MG tablet, Take 25 mg by mouth 3 (three) times daily as needed., Disp: , Rfl:    lipase/protease/amylase (CREON) 12000-38000 units CPEP capsule, Take by mouth., Disp: , Rfl:    lisinopril (PRINIVIL,ZESTRIL) 40 MG tablet, Take 40 mg by mouth daily., Disp: , Rfl:    methocarbamol (ROBAXIN) 500 MG  tablet, Take 1 tablet (500 mg total) by mouth every 8 (eight) hours as needed for muscle spasms., Disp: 12 tablet, Rfl: 0   mirtazapine (REMERON) 15 MG tablet, Take 15 mg by mouth daily., Disp: , Rfl:    omeprazole (PRILOSEC) 20 MG capsule, TAKE 1 CAPSULE BY MOUTH TWICE DAILY, Disp: 180 capsule, Rfl: 0   ondansetron (ZOFRAN ODT) 4 MG disintegrating tablet, Take 1 tablet (4 mg total) by mouth every 8 (eight) hours as needed., Disp: 20 tablet, Rfl: 0   pantoprazole (PROTONIX) 40 MG tablet, Take 1 tablet (40 mg total) by mouth daily., Disp: 30 tablet, Rfl: 1   predniSONE (DELTASONE) 10 MG tablet, Take 1 tablet (10 mg total) by mouth daily. Day 1-3: take 4 tablets PO daily Day 4-6: take 3 tablets PO daily Day 7-9: take 2 tablets PO daily Day 10-12: take 1 tablet PO daily, Disp: 30 tablet, Rfl: 0   pregabalin (LYRICA) 100 MG capsule, Take 100 mg by mouth 3 (three) times daily. , Disp: , Rfl:    pregabalin (LYRICA) 50 MG capsule, TAKE 1 CAPSULE BY MOUTH 3 TIMES DAILY, Disp: 90 capsule, Rfl: 0   sertraline (ZOLOFT) 50 MG tablet, TAKE 1 TABLET BY MOUTH TWICE DAILY, Disp: 60 tablet, Rfl: 1   Vitamin D, Ergocalciferol, (DRISDOL) 1.25 MG (50000 UNIT) CAPS capsule, TAKE 1 CAPSULE BY MOUTH WEEKLY, Disp: 12 capsule, Rfl: 3   Water For Irrigation, Sterile (FREE WATER) SOLN, Place 60 mLs into feeding tube every 8 (eight) hours., Disp: 60 mL, Rfl: 0   Allergies  Allergen Reactions   Aspirin Hives and Itching    ROS Review of Systems  Respiratory:  Positive for chest tightness.   Cardiovascular:  Positive for chest pain.  Gastrointestinal:  Positive for abdominal distention.  Genitourinary:  Negative for enuresis.  Musculoskeletal:  Positive for arthralgias and myalgias. Negative for back pain and neck pain.  Neurological:  Positive for light-headedness and headaches. Negative for syncope.  Psychiatric/Behavioral:  Positive for dysphoric mood.      Objective:    Physical Exam Vitals reviewed.   Constitutional:      Appearance: Normal appearance.  HENT:     Mouth/Throat:     Mouth: Mucous membranes are moist.  Eyes:     Pupils: Pupils are equal, round, and reactive to light.  Neck:     Vascular: No carotid bruit.  Cardiovascular:     Rate and Rhythm: Normal rate and regular rhythm.     Pulses: Normal pulses.     Heart sounds: Normal heart sounds.  Pulmonary:     Effort: Pulmonary effort  is normal.     Breath sounds: Normal breath sounds.  Abdominal:     General: Bowel sounds are normal.     Palpations: Abdomen is soft. There is no hepatomegaly, splenomegaly or mass.     Tenderness: There is no abdominal tenderness.     Hernia: No hernia is present.  Musculoskeletal:        General: No tenderness.     Cervical back: Neck supple.     Right lower leg: No edema.     Left lower leg: No edema.  Skin:    Findings: No rash.  Neurological:     Mental Status: She is alert and oriented to person, place, and time.     Motor: No weakness.  Psychiatric:        Mood and Affect: Mood and affect normal.        Behavior: Behavior normal.    BP 130/70   Pulse 82   Ht 5\' 5"  (1.651 m)   Wt 93 lb 1.6 oz (42.2 kg)   BMI 15.49 kg/m  Wt Readings from Last 3 Encounters:  08/11/20 93 lb 1.6 oz (42.2 kg)  08/04/20 92 lb (41.7 kg)  07/16/20 91 lb 6.4 oz (41.5 kg)     Health Maintenance Due  Topic Date Due   Hepatitis C Screening  Never done   Zoster Vaccines- Shingrix (1 of 2) Never done   PAP SMEAR-Modifier  Never done   COLONOSCOPY (Pts 45-70yrs Insurance coverage will need to be confirmed)  Never done   Pneumococcal Vaccine 22-51 Years old (3 - PCV) 01/11/2018   COVID-19 Vaccine (4 - Booster for Pfizer series) 01/08/2020    There are no preventive care reminders to display for this patient.  Lab Results  Component Value Date   TSH 0.40 07/07/2020   Lab Results  Component Value Date   WBC 10.7 (H) 08/04/2020   HGB 12.7 08/04/2020   HCT 38.0 08/04/2020   MCV 103.8  (H) 08/04/2020   PLT 272 08/04/2020   Lab Results  Component Value Date   NA 138 08/04/2020   K 4.5 08/04/2020   CO2 26 08/04/2020   GLUCOSE 103 (H) 08/04/2020   BUN 17 08/04/2020   CREATININE 0.54 08/04/2020   BILITOT 0.3 07/16/2020   ALKPHOS 62 04/09/2020   AST 15 07/16/2020   ALT 14 07/16/2020   PROT 6.4 07/16/2020   ALBUMIN 4.0 04/09/2020   CALCIUM 8.4 (L) 08/04/2020   ANIONGAP 4 (L) 08/04/2020   No results found for: CHOL No results found for: HDL No results found for: [facility] Lab Results  Component Value Date   TRIG 107 05/30/2014   No results found for: CHOLHDL No results found for: HGBA1C    Assessment & Plan:   Problem List Items Addressed This Visit       Cardiovascular and Mediastinum   Ischemic bowel disease ([place]) - Primary    Stable at the present time.  She has a short gut syndrome       Cardiomyopathy ([place])    Denies shortness of breath syncope or palpitation         Respiratory   COPD (chronic obstructive pulmonary disease) (HCC) (Chronic)   Adenocarcinoma of lung ([place])    Patient is under care of oncology         Other   Tobacco abuse counseling    - I instructed the patient to stop smoking and provided them with smoking cessation materials.  - I  informed the patient that smoking puts them at increased risk for cancer, COPD, hypertension, and more.  - Informed the patient to seek help if they begin to have trouble breathing, develop chest pain, start to cough up blood, feel faint, or pass out.       Adult failure to thrive    Patient was advised to drink Ensure every day        No orders of the defined types were placed in this encounter.   Follow-up: No follow-ups on file.    Cletis Athens, MD

## 2020-08-11 NOTE — Assessment & Plan Note (Signed)
Patient was advised to drink Ensure every day

## 2020-08-11 NOTE — Assessment & Plan Note (Signed)
-   I instructed the patient to stop smoking and provided them with smoking cessation materials.  - I informed the patient that smoking puts them at increased risk for cancer, COPD, hypertension, and more.  - Informed the patient to seek help if they begin to have trouble breathing, develop chest pain, start to cough up blood, feel faint, or pass out.  

## 2020-09-07 ENCOUNTER — Ambulatory Visit (INDEPENDENT_AMBULATORY_CARE_PROVIDER_SITE_OTHER): Payer: 59 | Admitting: Internal Medicine

## 2020-09-07 ENCOUNTER — Other Ambulatory Visit: Payer: Self-pay

## 2020-09-07 ENCOUNTER — Encounter: Payer: Self-pay | Admitting: Internal Medicine

## 2020-09-07 VITALS — BP 140/80 | HR 71 | Ht 65.0 in | Wt 85.1 lb

## 2020-09-07 DIAGNOSIS — Z716 Tobacco abuse counseling: Secondary | ICD-10-CM

## 2020-09-07 DIAGNOSIS — I10 Essential (primary) hypertension: Secondary | ICD-10-CM | POA: Diagnosis not present

## 2020-09-07 DIAGNOSIS — M12812 Other specific arthropathies, not elsewhere classified, left shoulder: Secondary | ICD-10-CM

## 2020-09-07 DIAGNOSIS — K559 Vascular disorder of intestine, unspecified: Secondary | ICD-10-CM

## 2020-09-07 DIAGNOSIS — C3490 Malignant neoplasm of unspecified part of unspecified bronchus or lung: Secondary | ICD-10-CM | POA: Diagnosis not present

## 2020-09-07 DIAGNOSIS — E440 Moderate protein-calorie malnutrition: Secondary | ICD-10-CM

## 2020-09-07 DIAGNOSIS — J410 Simple chronic bronchitis: Secondary | ICD-10-CM

## 2020-09-07 DIAGNOSIS — I429 Cardiomyopathy, unspecified: Secondary | ICD-10-CM

## 2020-09-07 NOTE — Progress Notes (Signed)
Established Patient Office Visit  Subjective:  Patient ID: [name] [name], female    DOB: 01-Jan-1956  Age: 65 y.o. MRN: [id number]  CC:  Chief Complaint  Patient presents with   Hip Pain    Patient feels knot on right hip that is causing her pain, first noticed last weekend.     Hip Pain  Pt has knot  on rt hip  [name]   Past Medical History:  Diagnosis Date   Bowel obstruction ([place])    x1 month ago   Cancer [facility])    stomach cancer per pt and currently lung ca   COPD (chronic obstructive pulmonary disease) ([place])    Essential hypertension 12/02/2019   Lung cancer ([place])    Personal history of radiation therapy 2019-2020   lung ca    Past Surgical History:  Procedure Laterality Date   COLON SURGERY      Family History  Problem Relation Age of Onset   Lupus Sister    Heart failure Neg Hx    Diabetes Neg Hx     Social History   Socioeconomic History   Marital status: Widowed    Spouse name: Not on file   Number of children: Not on file   Years of education: Not on file   Highest education level: Not on file  Occupational History   Occupation: on disability  Tobacco Use   Smoking status: Every Day    Packs/day: 0.25    Types: Cigarettes   Smokeless tobacco: Never  Vaping Use   Vaping Use: Never used  Substance and Sexual Activity   Alcohol use: Yes    Comment: occas   Drug use: No   Sexual activity: Yes    Birth control/protection: Post-menopausal  Other Topics Concern   Not on file  Social History Narrative   Not on file   Social Determinants of Health   Financial Resource Strain: Not on file  Food Insecurity: Not on file  Transportation Needs: Not on file  Physical Activity: Not on file  Stress: Not on file  Social Connections: Not on file  Intimate Partner Violence: Not on file     Current Outpatient Medications:    acetaminophen (TYLENOL) 160 MG/5ML solution, Take 5 mLs (160 mg total) by mouth every 6 (six) hours as needed for  mild pain., Disp: 120 mL, Rfl: 6   albuterol (PROVENTIL) (2.5 MG/3ML) 0.083% nebulizer solution, Take 3 mLs (2.5 mg total) by nebulization 3 (three) times daily as needed for wheezing., Disp: 75 mL, Rfl: 12   atorvastatin (LIPITOR) 40 MG tablet, Take 40 mg by mouth daily., Disp: , Rfl:    budesonide-formoterol (SYMBICORT) 160-4.5 MCG/ACT inhaler, Inhale 1 puff into the lungs 2 (two) times daily., Disp: 10.2 g, Rfl: 3   buPROPion (WELLBUTRIN SR) 100 MG 12 hr tablet, Take 1 tablet (100 mg total) by mouth 2 (two) times daily., Disp: 60 tablet, Rfl: 2   cefUROXime (CEFTIN) 500 MG tablet, Take 500 mg by mouth 2 (two) times daily with a meal., Disp: , Rfl:    cholestyramine (QUESTRAN) 4 GM/DOSE powder, TAKE 1 SCOOPFUL(4 GRAMS) MIXED IN 4-8 OUNCES OF LIQUID AND DRINK, DAILY, Disp: 368.76 g, Rfl: 3   cyclobenzaprine (FLEXERIL) 5 MG tablet, Take 5 mg by mouth 3 (three) times daily as needed for muscle spasms., Disp: , Rfl:    cycloSPORINE (RESTASIS) 0.05 % ophthalmic emulsion, Place 1 drop into both eyes every 12 (twelve) hours., Disp: , Rfl:  feeding supplement, ENSURE ENLIVE, (ENSURE ENLIVE) LIQD, Take 237 mLs by mouth 2 (two) times daily between meals., Disp: 237 mL, Rfl: 12   fluticasone (FLONASE) 50 MCG/ACT nasal spray, USE 1 SPRAY INTO EACH NOSTRIL TWICE DAILY, Disp: 16 g, Rfl: 6   hydrochlorothiazide (MICROZIDE) 12.5 MG capsule, Take 12.5 mg by mouth daily., Disp: , Rfl:    HYDROcodone-acetaminophen (NORCO/VICODIN) 5-325 MG tablet, Take 1 tablet by mouth every 6 (six) hours as needed for moderate pain., Disp: 15 tablet, Rfl: 0   hydrOXYzine (ATARAX/VISTARIL) 25 MG tablet, Take 25 mg by mouth 3 (three) times daily as needed., Disp: , Rfl:    lipase/protease/amylase (CREON) 12000-38000 units CPEP capsule, Take by mouth., Disp: , Rfl:    lisinopril (PRINIVIL,ZESTRIL) 40 MG tablet, Take 40 mg by mouth daily., Disp: , Rfl:    methocarbamol (ROBAXIN) 500 MG tablet, Take 1 tablet (500 mg total) by mouth  every 8 (eight) hours as needed for muscle spasms., Disp: 12 tablet, Rfl: 0   mirtazapine (REMERON) 15 MG tablet, Take 15 mg by mouth daily., Disp: , Rfl:    omeprazole (PRILOSEC) 20 MG capsule, TAKE 1 CAPSULE BY MOUTH TWICE DAILY, Disp: 180 capsule, Rfl: 0   ondansetron (ZOFRAN ODT) 4 MG disintegrating tablet, Take 1 tablet (4 mg total) by mouth every 8 (eight) hours as needed., Disp: 20 tablet, Rfl: 0   pantoprazole (PROTONIX) 40 MG tablet, Take 1 tablet (40 mg total) by mouth daily., Disp: 30 tablet, Rfl: 1   predniSONE (DELTASONE) 10 MG tablet, Take 1 tablet (10 mg total) by mouth daily. Day 1-3: take 4 tablets PO daily Day 4-6: take 3 tablets PO daily Day 7-9: take 2 tablets PO daily Day 10-12: take 1 tablet PO daily, Disp: 30 tablet, Rfl: 0   pregabalin (LYRICA) 100 MG capsule, Take 100 mg by mouth 3 (three) times daily. , Disp: , Rfl:    pregabalin (LYRICA) 50 MG capsule, TAKE 1 CAPSULE BY MOUTH 3 TIMES DAILY, Disp: 90 capsule, Rfl: 0   sertraline (ZOLOFT) 50 MG tablet, TAKE 1 TABLET BY MOUTH TWICE DAILY, Disp: 60 tablet, Rfl: 1   Vitamin D, Ergocalciferol, (DRISDOL) 1.25 MG (50000 UNIT) CAPS capsule, TAKE 1 CAPSULE BY MOUTH WEEKLY, Disp: 12 capsule, Rfl: 3   Water For Irrigation, Sterile (FREE WATER) SOLN, Place 60 mLs into feeding tube every 8 (eight) hours., Disp: 60 mL, Rfl: 0   Allergies  Allergen Reactions   Aspirin Hives and Itching    ROS Review of Systems  Constitutional: Negative.   HENT: Negative.    Eyes: Negative.   Respiratory: Negative.    Cardiovascular: Negative.   Gastrointestinal: Negative.   Endocrine: Negative.   Genitourinary: Negative.   Musculoskeletal: Negative.   Skin: Negative.   Allergic/Immunologic: Negative.   Neurological: Negative.   Hematological: Negative.   Psychiatric/Behavioral: Negative.    All other systems reviewed and are negative.    Objective:    Physical Exam Vitals (failure to thrive) reviewed.  Constitutional:       Appearance: Normal appearance.  HENT:     Mouth/Throat:     Mouth: Mucous membranes are moist.  Eyes:     Pupils: Pupils are equal, round, and reactive to light.  Neck:     Vascular: No carotid bruit.  Cardiovascular:     Rate and Rhythm: Normal rate and regular rhythm.     Heart sounds: Normal heart sounds.  Pulmonary:     Breath sounds: Normal breath sounds.  Abdominal:  General: Bowel sounds are normal.     Palpations: Abdomen is soft. There is no hepatomegaly, splenomegaly or mass.     Tenderness: There is no abdominal tenderness.     Hernia: No hernia is present.  Musculoskeletal:        General: No tenderness.     Cervical back: Neck supple.     Right lower leg: No edema.     Left lower leg: No edema.  Skin:    Findings: No rash.  Neurological:     Mental Status: She is alert and oriented to person, place, and time.     Motor: No weakness.  Psychiatric:        Mood and Affect: Mood and affect normal.        Behavior: Behavior normal.    BP 140/80   Pulse 71   Ht 5\' 5"  (1.651 m)   Wt 85 lb 1.6 oz (38.6 kg)   BMI 14.16 kg/m  Wt Readings from Last 3 Encounters:  09/07/20 85 lb 1.6 oz (38.6 kg)  08/11/20 93 lb 1.6 oz (42.2 kg)  08/04/20 92 lb (41.7 kg)     Health Maintenance Due  Topic Date Due   Hepatitis C Screening  Never done   Zoster Vaccines- Shingrix (1 of 2) Never done   PAP SMEAR-Modifier  Never done   COLONOSCOPY (Pts 45-19yrs Insurance coverage will need to be confirmed)  Never done   Pneumococcal Vaccine 45-39 Years old (3 - PCV) 01/11/2018   COVID-19 Vaccine (4 - Booster for Pfizer series) 01/08/2020   INFLUENZA VACCINE  08/24/2020    There are no preventive care reminders to display for this patient.  Lab Results  Component Value Date   TSH 0.40 07/07/2020   Lab Results  Component Value Date   WBC 10.7 (H) 08/04/2020   HGB 12.7 08/04/2020   HCT 38.0 08/04/2020   MCV 103.8 (H) 08/04/2020   PLT 272 08/04/2020   Lab Results   Component Value Date   NA 138 08/04/2020   K 4.5 08/04/2020   CO2 26 08/04/2020   GLUCOSE 103 (H) 08/04/2020   BUN 17 08/04/2020   CREATININE 0.54 08/04/2020   BILITOT 0.3 07/16/2020   ALKPHOS 62 04/09/2020   AST 15 07/16/2020   ALT 14 07/16/2020   PROT 6.4 07/16/2020   ALBUMIN 4.0 04/09/2020   CALCIUM 8.4 (L) 08/04/2020   ANIONGAP 4 (L) 08/04/2020   No results found for: CHOL No results found for: HDL No results found for: [facility] Lab Results  Component Value Date   TRIG 107 05/30/2014   No results found for: CHOLHDL No results found for: HGBA1C    Assessment & Plan:   Problem List Items Addressed This Visit       Cardiovascular and Mediastinum   Ischemic bowel disease ([place]) - Primary   Cardiomyopathy ([place])   Essential hypertension     Respiratory   COPD (chronic obstructive pulmonary disease) (HCC) (Chronic)   Adenocarcinoma of lung (HCC)     Musculoskeletal and Integument   Rotator cuff arthropathy of left shoulder    Suggest rotator cuff exercises        Other   Protein calorie malnutrition ([place])    Drink Ensure every day      Tobacco abuse counseling    - I instructed the patient to stop smoking and provided them with smoking cessation materials.  - I informed the patient that smoking puts them at increased risk for cancer,  COPD, hypertension, and more.  - Informed the patient to seek help if they begin to have trouble breathing, develop chest pain, start to cough up blood, feel faint, or pass out.      Patient has a knot in the right hip which is due to contracture of some of the ligaments, there is no sign of infection we will watch it closely No orders of the defined types were placed in this encounter.   Follow-up: No follow-ups on file.    Cletis Athens, MD

## 2020-09-08 NOTE — Assessment & Plan Note (Signed)
-   I instructed the patient to stop smoking and provided them with smoking cessation materials.  - I informed the patient that smoking puts them at increased risk for cancer, COPD, hypertension, and more.  - Informed the patient to seek help if they begin to have trouble breathing, develop chest pain, start to cough up blood, feel faint, or pass out.  

## 2020-09-08 NOTE — Assessment & Plan Note (Signed)
Suggest rotator cuff exercises

## 2020-09-08 NOTE — Assessment & Plan Note (Signed)
Drink Ensure every day

## 2020-09-09 ENCOUNTER — Other Ambulatory Visit: Payer: Self-pay

## 2020-09-09 ENCOUNTER — Ambulatory Visit (INDEPENDENT_AMBULATORY_CARE_PROVIDER_SITE_OTHER): Payer: 59

## 2020-09-09 ENCOUNTER — Ambulatory Visit (INDEPENDENT_AMBULATORY_CARE_PROVIDER_SITE_OTHER): Payer: 59 | Admitting: Cardiology

## 2020-09-09 ENCOUNTER — Encounter: Payer: Self-pay | Admitting: Cardiology

## 2020-09-09 VITALS — BP 144/82 | HR 65 | Ht 65.0 in | Wt 87.2 lb

## 2020-09-09 DIAGNOSIS — R627 Adult failure to thrive: Secondary | ICD-10-CM

## 2020-09-09 DIAGNOSIS — R55 Syncope and collapse: Secondary | ICD-10-CM

## 2020-09-09 NOTE — Patient Instructions (Addendum)
Medication Instructions:  Your physician recommends that you continue on your current medications as directed. Please refer to the Current Medication list given to you today. *If you need a refill on your cardiac medications before your next appointment, please call your pharmacy*  Lab Work: None ordered. If you have labs (blood work) drawn today and your tests are completely normal, you will receive your results only by: [place] (if you have MyChart) OR A paper copy in the mail If you have any lab test that is abnormal or we need to change your treatment, we will call you to review the results.  Testing/Procedures: Your physician has requested that you have an echocardiogram. Echocardiography is a painless test that uses sound waves to create images of your heart. It provides your doctor with information about the size and shape of your heart and how well your heart's chambers and valves are working. This procedure takes approximately one hour. There are no restrictions for this procedure.  Please schedule for ECHO  You will wear a 14 day ZIO monitor  Follow-Up: At [facility], you and your health needs are our priority.  As part of our continuing mission to provide you with exceptional heart care, we have created designated Provider Care Teams.  These Care Teams include your primary Cardiologist (physician) and Advanced Practice Providers (APPs -  Physician Assistants and Nurse Practitioners) who all work together to provide you with the care you need, when you need it.  Your next appointment:   Your physician wants you to follow-up in: 6 weeks with Dr. Quentin Ore.  This will be a phone call (virtual visit)  [place] Monitor Instructions  Your physician has requested you wear a ZIO patch monitor for 14 days.  This is a single patch monitor. [employer] supplies one patch monitor per enrollment. Additional stickers are not available. Please do not apply patch if you will be  having a Nuclear Stress Test,  Echocardiogram, Cardiac CT, MRI, or Chest Xray during the period you would be wearing the  monitor. The patch cannot be worn during these tests. You cannot remove and re-apply the  ZIO XT patch monitor.  Your ZIO patch monitor will be mailed 3 day USPS to your address on file. It may take 3-5 days  to receive your monitor after you have been enrolled.  Once you have received your monitor, please review the enclosed instructions. Your monitor  has already been registered assigning a specific monitor serial # to you.  Billing and Patient Assistance Program Information  We have supplied [employer] with any of your insurance information on file for billing purposes. [employer] offers a sliding scale Patient Assistance Program for patients that do not have  insurance, or whose insurance does not completely cover the cost of the ZIO monitor.  You must apply for the Patient Assistance Program to qualify for this discounted rate.  To apply, please call [employer] at [phone], select option 4, select option 2, ask to apply for  Patient Assistance Program. Theodore Demark will ask your household income, and how many people  are in your household. They will quote your out-of-pocket cost based on that information.  [employer] will also be able to set up a 8-month, interest-free payment plan if needed.  Applying the monitor   Shave hair from upper left chest.  Hold abrader disc by orange tab. Rub abrader in 40 strokes over the upper left chest as  indicated in your monitor instructions.  Clean area  with 4 enclosed alcohol pads. Let dry.  Apply patch as indicated in monitor instructions. Patch will be placed under collarbone on left  side of chest with arrow pointing upward.  Rub patch adhesive wings for 2 minutes. Remove white label marked "1". Remove the white  label marked "2". Rub patch adhesive wings for 2 additional minutes.  While looking in a mirror, press and release button  in center of patch. A small green light will  flash 3-4 times. This will be your only indicator that the monitor has been turned on.  Do not shower for the first 24 hours. You may shower after the first 24 hours.  Press the button if you feel a symptom. You will hear a small click. Record Date, Time and  Symptom in the Patient Logbook.  When you are ready to remove the patch, follow instructions on the last 2 pages of Patient  Logbook. Stick patch monitor onto the last page of Patient Logbook.  Place Patient Logbook in the blue and white box. Use locking tab on box and tape box closed  securely. The blue and white box has prepaid postage on it. Please place it in the mailbox as  soon as possible. Your physician should have your test results approximately 7 days after the  monitor has been mailed back to [facility].  Call [facility] at [phone] if you have questions regarding  your ZIO XT patch monitor. Call them immediately if you see an orange light blinking on your  monitor.  If your monitor falls off in less than 4 days, contact our Monitor department at [phone].  If your monitor becomes loose or falls off after 4 days call [employer] at [phone] for  suggestions on securing your monitor

## 2020-09-09 NOTE — Progress Notes (Signed)
Electrophysiology Office Note:    Date:  09/09/2020   ID:  [name], DOB Jun 04, 1955, MRN [id number]  PCP:  Cletis Athens, MD  [facility] Cardiologist:  None  [facility] Electrophysiologist:  Vickie Epley, MD   Referring MD: Beckie Salts, FNP   Chief Complaint: Syncope  History of Present Illness:    [name] [name] is a 65 y.o. female who presents for an evaluation of syncope at the request of Dr. Rebecka Apley. Their medical history includes COPD, hypertension.  She tells me her cancer is in remission right now.  She is having a lot of problems maintaining her weight.  She cannot afford to use Ensure.  She drinks a lot of water.  In the last month she thinks she may have lost 10 pounds.  She is 87 pounds today in clinic.  She tells me she is passed out many times, as frequently as 2 times per week.  Her most recent episode of passing out was 3 weeks ago.  She has never injured herself during a passing out episode.  When she passes out she is unconscious for 10 to 15 seconds.  She tells me there is no warning before she passes out.  She says she feels palpitations sometimes but they do not correspond to lightheadedness or dizziness.   Past Medical History:  Diagnosis Date   Bowel obstruction ([place])    x1 month ago   Cancer ([place])    stomach cancer per pt and currently lung ca   COPD (chronic obstructive pulmonary disease) ([place])    Essential hypertension 12/02/2019   Lung cancer ([place])    Personal history of radiation therapy 2019-2020   lung ca    Past Surgical History:  Procedure Laterality Date   COLON SURGERY      Current Medications: Current Meds  Medication Sig   acetaminophen (TYLENOL) 160 MG/5ML solution Take 5 mLs (160 mg total) by mouth every 6 (six) hours as needed for mild pain.   albuterol (PROVENTIL) (2.5 MG/3ML) 0.083% nebulizer solution Take 3 mLs (2.5 mg total) by nebulization 3 (three) times daily as needed for wheezing.   atorvastatin (LIPITOR)  40 MG tablet Take 40 mg by mouth daily.   budesonide-formoterol (SYMBICORT) 160-4.5 MCG/ACT inhaler Inhale 1 puff into the lungs 2 (two) times daily.   buPROPion (WELLBUTRIN SR) 100 MG 12 hr tablet Take 1 tablet (100 mg total) by mouth 2 (two) times daily.   cefUROXime (CEFTIN) 500 MG tablet Take 500 mg by mouth 2 (two) times daily with a meal.   cholestyramine (QUESTRAN) 4 GM/DOSE powder TAKE 1 SCOOPFUL(4 GRAMS) MIXED IN 4-8 OUNCES OF LIQUID AND DRINK, DAILY   cyclobenzaprine (FLEXERIL) 5 MG tablet Take 5 mg by mouth 3 (three) times daily as needed for muscle spasms.   cycloSPORINE (RESTASIS) 0.05 % ophthalmic emulsion Place 1 drop into both eyes every 12 (twelve) hours.   feeding supplement, ENSURE ENLIVE, (ENSURE ENLIVE) LIQD Take 237 mLs by mouth 2 (two) times daily between meals.   fluticasone (FLONASE) 50 MCG/ACT nasal spray USE 1 SPRAY INTO EACH NOSTRIL TWICE DAILY   hydrochlorothiazide (MICROZIDE) 12.5 MG capsule Take 12.5 mg by mouth daily.   HYDROcodone-acetaminophen (NORCO/VICODIN) 5-325 MG tablet Take 1 tablet by mouth every 6 (six) hours as needed for moderate pain.   hydrOXYzine (ATARAX/VISTARIL) 25 MG tablet Take 25 mg by mouth 3 (three) times daily as needed.   lipase/protease/amylase (CREON) 12000-38000 units CPEP capsule Take by mouth.   lisinopril (  PRINIVIL,ZESTRIL) 40 MG tablet Take 40 mg by mouth daily.   methocarbamol (ROBAXIN) 500 MG tablet Take 1 tablet (500 mg total) by mouth every 8 (eight) hours as needed for muscle spasms.   mirtazapine (REMERON) 15 MG tablet Take 15 mg by mouth daily.   omeprazole (PRILOSEC) 20 MG capsule TAKE 1 CAPSULE BY MOUTH TWICE DAILY   ondansetron (ZOFRAN ODT) 4 MG disintegrating tablet Take 1 tablet (4 mg total) by mouth every 8 (eight) hours as needed.   pantoprazole (PROTONIX) 40 MG tablet Take 1 tablet (40 mg total) by mouth daily.   predniSONE (DELTASONE) 10 MG tablet Take 1 tablet (10 mg total) by mouth daily. Day 1-3: take 4 tablets PO  daily Day 4-6: take 3 tablets PO daily Day 7-9: take 2 tablets PO daily Day 10-12: take 1 tablet PO daily   pregabalin (LYRICA) 100 MG capsule Take 100 mg by mouth 3 (three) times daily.    pregabalin (LYRICA) 50 MG capsule TAKE 1 CAPSULE BY MOUTH 3 TIMES DAILY   sertraline (ZOLOFT) 50 MG tablet TAKE 1 TABLET BY MOUTH TWICE DAILY   Vitamin D, Ergocalciferol, (DRISDOL) 1.25 MG (50000 UNIT) CAPS capsule TAKE 1 CAPSULE BY MOUTH WEEKLY   Water For Irrigation, Sterile (FREE WATER) SOLN Place 60 mLs into feeding tube every 8 (eight) hours.     Allergies:   Aspirin   Social History   Socioeconomic History   Marital status: Widowed    Spouse name: Not on file   Number of children: Not on file   Years of education: Not on file   Highest education level: Not on file  Occupational History   Occupation: on disability  Tobacco Use   Smoking status: Every Day    Packs/day: 0.25    Types: Cigarettes   Smokeless tobacco: Never  Vaping Use   Vaping Use: Never used  Substance and Sexual Activity   Alcohol use: Yes    Comment: occas   Drug use: No   Sexual activity: Yes    Birth control/protection: Post-menopausal  Other Topics Concern   Not on file  Social History Narrative   Not on file   Social Determinants of Health   Financial Resource Strain: Not on file  Food Insecurity: Not on file  Transportation Needs: Not on file  Physical Activity: Not on file  Stress: Not on file  Social Connections: Not on file     Family History: The patient's family history includes Lupus in her sister. There is no history of Heart failure or Diabetes.  ROS:   Please see the history of present illness.    All other systems reviewed and are negative.  EKGs/Labs/Other Studies Reviewed:    The following studies were reviewed today:  August 04, 2020 EKG personally reviewed shows sinus rhythm and LVH     EKG:  The ekg ordered today demonstrates sinus rhythm.  LVH. Recent Labs: 07/07/2020: TSH  0.40 07/16/2020: ALT 14 08/04/2020: BUN 17; Creatinine, Ser 0.54; Hemoglobin 12.7; Platelets 272; Potassium 4.5; Sodium 138  Recent Lipid Panel    Component Value Date/Time   TRIG 107 05/30/2014 0648    Physical Exam:    VS:  BP (!) 144/82   Pulse 65   Ht 5\' 5"  (1.651 m)   Wt 87 lb 3.2 oz (39.6 kg)   SpO2 98%   BMI 14.51 kg/m     Wt Readings from Last 3 Encounters:  09/09/20 87 lb 3.2 oz (39.6 kg)  09/07/20  85 lb 1.6 oz (38.6 kg)  08/11/20 93 lb 1.6 oz (42.2 kg)     GEN: Cachectic.  Chronically ill-appearing.  HEENT: Normal NECK: No JVD; No carotid bruits LYMPHATICS: No lymphadenopathy CARDIAC: RRR, no murmurs, rubs, gallops RESPIRATORY:  Clear to auscultation without rales, wheezing or rhonchi  ABDOMEN: Soft, non-tender, non-distended MUSCULOSKELETAL:  No edema; No deformity  SKIN: Warm and dry NEUROLOGIC:  Alert and oriented x 3 PSYCHIATRIC:  Normal affect   ASSESSMENT:    1. Syncope and collapse   2. Adult failure to thrive    PLAN:    In order of problems listed above:  #Syncope Her history of syncope is concerning.  She does not have a prodrome.  She has had many episodes.  Despite the frequency of her episodes she has never had personal injury.  This raises the question whether or not she has a prodrome but is just unable to recall having the symptoms.  We will evaluate this with a 2-week ZIO monitor and an echocardiogram to assess for any structural heart disease.  We will base follow-up on the results of these test.  She should avoid driving until the work-up is completed.  I am most concerned that her syncope could be a manifestation of her failure to thrive and poor nutritional status.  She tells me she is frequently nauseous and is having a lot of problems maintaining her weight.  I would recommend close follow-up with her primary care physician and social work regarding mechanisms to help support her nutritionally with Ensure or other high caloric  foods.  #Failure to thrive Primary care follow-up as above.   Virtual follow-up in 4 to 6 weeks to review results of above tests.       Medication Adjustments/Labs and Tests Ordered: Current medicines are reviewed at length with the patient today.  Concerns regarding medicines are outlined above.  Orders Placed This Encounter  Procedures   LONG TERM MONITOR (3-14 DAYS)   EKG 12-Lead   ECHOCARDIOGRAM COMPLETE    No orders of the defined types were placed in this encounter.    Signed, Hilton Cork. Quentin Ore, MD, [facility], [facility] 09/09/2020 11:44 AM    Electrophysiology [facility]

## 2020-09-15 ENCOUNTER — Ambulatory Visit: Payer: 59 | Admitting: Internal Medicine

## 2020-09-23 DIAGNOSIS — R55 Syncope and collapse: Secondary | ICD-10-CM | POA: Diagnosis not present

## 2020-10-03 ENCOUNTER — Other Ambulatory Visit: Payer: Self-pay | Admitting: Internal Medicine

## 2020-10-03 DIAGNOSIS — R079 Chest pain, unspecified: Secondary | ICD-10-CM

## 2020-10-08 ENCOUNTER — Ambulatory Visit (INDEPENDENT_AMBULATORY_CARE_PROVIDER_SITE_OTHER): Payer: 59

## 2020-10-08 ENCOUNTER — Encounter: Payer: Self-pay | Admitting: *Deleted

## 2020-10-08 ENCOUNTER — Other Ambulatory Visit: Payer: Self-pay

## 2020-10-08 DIAGNOSIS — R55 Syncope and collapse: Secondary | ICD-10-CM | POA: Diagnosis not present

## 2020-10-08 LAB — ECHOCARDIOGRAM COMPLETE
AR max vel: 2.16 cm2
AV Area VTI: 2.28 cm2
AV Area mean vel: 1.8 cm2
AV Mean grad: 3 mmHg
AV Peak grad: 6.6 mmHg
Ao pk vel: 1.28 m/s
Area-P 1/2: 2.22 cm2
Calc EF: 64.1 %
S' Lateral: 3.2 cm
Single Plane A2C EF: 71 %
Single Plane A4C EF: 57.8 %

## 2020-10-12 ENCOUNTER — Ambulatory Visit: Payer: 59 | Admitting: Internal Medicine

## 2020-10-14 ENCOUNTER — Telehealth: Payer: Self-pay | Admitting: Cardiology

## 2020-10-14 NOTE — Telephone Encounter (Signed)
  Patient Consent for Virtual Visit        [name] [name] has provided verbal consent on 10/14/2020 for a virtual visit (video or telephone).   CONSENT FOR VIRTUAL VISIT FOR:  [name] [name]  By participating in this virtual visit I agree to the following:  I hereby voluntarily request, consent and authorize [facility] and its employed or contracted physicians, physician assistants, nurse practitioners or other licensed health care professionals (the Practitioner), to provide me with telemedicine health care services (the "Services") as deemed necessary by the treating Practitioner. I acknowledge and consent to receive the Services by the Practitioner via telemedicine. I understand that the telemedicine visit will involve communicating with the Practitioner through live audiovisual communication technology and the disclosure of certain medical information by electronic transmission. I acknowledge that I have been given the opportunity to request an in-person assessment or other available alternative prior to the telemedicine visit and am voluntarily participating in the telemedicine visit.  I understand that I have the right to withhold or withdraw my consent to the use of telemedicine in the course of my care at any time, without affecting my right to future care or treatment, and that the Practitioner or I may terminate the telemedicine visit at any time. I understand that I have the right to inspect all information obtained and/or recorded in the course of the telemedicine visit and may receive copies of available information for a reasonable fee.  I understand that some of the potential risks of receiving the Services via telemedicine include:  Delay or interruption in medical evaluation due to technological equipment failure or disruption; Information transmitted may not be sufficient (e.g. poor resolution of images) to allow for appropriate medical decision making by the Practitioner; and/or   In rare instances, security protocols could fail, causing a breach of personal health information.  Furthermore, I acknowledge that it is my responsibility to provide information about my medical history, conditions and care that is complete and accurate to the best of my ability. I acknowledge that Practitioner's advice, recommendations, and/or decision may be based on factors not within their control, such as incomplete or inaccurate data provided by me or distortions of diagnostic images or specimens that may result from electronic transmissions. I understand that the practice of medicine is not an exact science and that Practitioner makes no warranties or guarantees regarding treatment outcomes. I acknowledge that a copy of this consent can be made available to me via my patient portal ([facility]), or I can request a printed copy by calling the office of [facility].    I understand that my insurance will be billed for this visit.   I have read or had this consent read to me. I understand the contents of this consent, which adequately explains the benefits and risks of the Services being provided via telemedicine.  I have been provided ample opportunity to ask questions regarding this consent and the Services and have had my questions answered to my satisfaction. I give my informed consent for the services to be provided through the use of telemedicine in my medical care

## 2020-10-19 ENCOUNTER — Encounter: Payer: Self-pay | Admitting: Internal Medicine

## 2020-10-19 ENCOUNTER — Other Ambulatory Visit: Payer: Self-pay

## 2020-10-19 ENCOUNTER — Ambulatory Visit (INDEPENDENT_AMBULATORY_CARE_PROVIDER_SITE_OTHER): Payer: 59 | Admitting: Internal Medicine

## 2020-10-19 VITALS — BP 154/63 | HR 84 | Ht 65.0 in | Wt 87.6 lb

## 2020-10-19 DIAGNOSIS — I10 Essential (primary) hypertension: Secondary | ICD-10-CM

## 2020-10-19 DIAGNOSIS — K559 Vascular disorder of intestine, unspecified: Secondary | ICD-10-CM

## 2020-10-19 DIAGNOSIS — C3490 Malignant neoplasm of unspecified part of unspecified bronchus or lung: Secondary | ICD-10-CM | POA: Diagnosis not present

## 2020-10-19 DIAGNOSIS — E43 Unspecified severe protein-calorie malnutrition: Secondary | ICD-10-CM

## 2020-10-19 DIAGNOSIS — Z23 Encounter for immunization: Secondary | ICD-10-CM

## 2020-10-19 DIAGNOSIS — I429 Cardiomyopathy, unspecified: Secondary | ICD-10-CM

## 2020-10-19 DIAGNOSIS — J410 Simple chronic bronchitis: Secondary | ICD-10-CM

## 2020-10-19 NOTE — Assessment & Plan Note (Signed)
Patient was suggested to drink Ensure every day

## 2020-10-19 NOTE — Assessment & Plan Note (Signed)
Patient is being followed up in [facility] for that

## 2020-10-19 NOTE — Assessment & Plan Note (Signed)
Blood pressure is under control on present medication

## 2020-10-19 NOTE — Progress Notes (Signed)
Established Patient Office Visit  Subjective:  Patient ID: [name] [name], female    DOB: 1956/01/03  Age: 65 y.o. MRN: [id number]  CC:  Chief Complaint  Patient presents with   Follow-up    HPI  [name] [name] presents for general check  Past Medical History:  Diagnosis Date   Bowel obstruction ([place])    x1 month ago   Cancer [facility])    stomach cancer per pt and currently lung ca   COPD (chronic obstructive pulmonary disease) ([place])    Essential hypertension 12/02/2019   Lung cancer ([place])    Personal history of radiation therapy 2019-2020   lung ca    Past Surgical History:  Procedure Laterality Date   COLON SURGERY      Family History  Problem Relation Age of Onset   Lupus Sister    Heart failure Neg Hx    Diabetes Neg Hx     Social History   Socioeconomic History   Marital status: Widowed    Spouse name: Not on file   Number of children: Not on file   Years of education: Not on file   Highest education level: Not on file  Occupational History   Occupation: on disability  Tobacco Use   Smoking status: Every Day    Packs/day: 0.25    Types: Cigarettes   Smokeless tobacco: Never  Vaping Use   Vaping Use: Never used  Substance and Sexual Activity   Alcohol use: Yes    Comment: occas   Drug use: No   Sexual activity: Yes    Birth control/protection: Post-menopausal  Other Topics Concern   Not on file  Social History Narrative   Not on file   Social Determinants of Health   Financial Resource Strain: Not on file  Food Insecurity: Not on file  Transportation Needs: Not on file  Physical Activity: Not on file  Stress: Not on file  Social Connections: Not on file  Intimate Partner Violence: Not on file     Current Outpatient Medications:    acetaminophen (TYLENOL) 160 MG/5ML solution, Take 5 mLs (160 mg total) by mouth every 6 (six) hours as needed for mild pain., Disp: 120 mL, Rfl: 6   albuterol (PROVENTIL) (2.5 MG/3ML) 0.083% nebulizer  solution, Take 3 mLs (2.5 mg total) by nebulization 3 (three) times daily as needed for wheezing., Disp: 75 mL, Rfl: 12   atorvastatin (LIPITOR) 40 MG tablet, Take 40 mg by mouth daily., Disp: , Rfl:    budesonide-formoterol (SYMBICORT) 160-4.5 MCG/ACT inhaler, Inhale 1 puff into the lungs 2 (two) times daily., Disp: 10.2 g, Rfl: 3   buPROPion (WELLBUTRIN SR) 100 MG 12 hr tablet, Take 1 tablet (100 mg total) by mouth 2 (two) times daily., Disp: 60 tablet, Rfl: 2   cefUROXime (CEFTIN) 500 MG tablet, Take 500 mg by mouth 2 (two) times daily with a meal., Disp: , Rfl:    cholestyramine (QUESTRAN) 4 GM/DOSE powder, TAKE 1 SCOOPFUL(4 GRAMS) MIXED IN 4-8 OUNCES OF LIQUID AND DRINK, DAILY, Disp: 368.76 g, Rfl: 3   cyclobenzaprine (FLEXERIL) 5 MG tablet, Take 5 mg by mouth 3 (three) times daily as needed for muscle spasms., Disp: , Rfl:    cycloSPORINE (RESTASIS) 0.05 % ophthalmic emulsion, Place 1 drop into both eyes every 12 (twelve) hours., Disp: , Rfl:    feeding supplement, ENSURE ENLIVE, (ENSURE ENLIVE) LIQD, Take 237 mLs by mouth 2 (two) times daily between meals., Disp: 237 mL, Rfl: 12  fluticasone (FLONASE) 50 MCG/ACT nasal spray, USE 1 SPRAY INTO EACH NOSTRIL TWICE DAILY, Disp: 16 g, Rfl: 6   hydrochlorothiazide (MICROZIDE) 12.5 MG capsule, Take 12.5 mg by mouth daily., Disp: , Rfl:    HYDROcodone-acetaminophen (NORCO/VICODIN) 5-325 MG tablet, Take 1 tablet by mouth every 6 (six) hours as needed for moderate pain., Disp: 15 tablet, Rfl: 0   hydrOXYzine (ATARAX/VISTARIL) 25 MG tablet, Take 25 mg by mouth 3 (three) times daily as needed., Disp: , Rfl:    lipase/protease/amylase (CREON) 12000-38000 units CPEP capsule, Take by mouth., Disp: , Rfl:    lisinopril (PRINIVIL,ZESTRIL) 40 MG tablet, Take 40 mg by mouth daily., Disp: , Rfl:    methocarbamol (ROBAXIN) 500 MG tablet, Take 1 tablet (500 mg total) by mouth every 8 (eight) hours as needed for muscle spasms., Disp: 12 tablet, Rfl: 0   mirtazapine  (REMERON) 15 MG tablet, Take 15 mg by mouth daily., Disp: , Rfl:    omeprazole (PRILOSEC) 20 MG capsule, TAKE 1 CAPSULE BY MOUTH TWICE DAILY, Disp: 180 capsule, Rfl: 0   ondansetron (ZOFRAN ODT) 4 MG disintegrating tablet, Take 1 tablet (4 mg total) by mouth every 8 (eight) hours as needed., Disp: 20 tablet, Rfl: 0   pantoprazole (PROTONIX) 40 MG tablet, TAKE 1 TABLET BY MOUTH ONCE DAILY, Disp: 30 tablet, Rfl: 1   predniSONE (DELTASONE) 10 MG tablet, Take 1 tablet (10 mg total) by mouth daily. Day 1-3: take 4 tablets PO daily Day 4-6: take 3 tablets PO daily Day 7-9: take 2 tablets PO daily Day 10-12: take 1 tablet PO daily, Disp: 30 tablet, Rfl: 0   pregabalin (LYRICA) 100 MG capsule, Take 100 mg by mouth 3 (three) times daily. , Disp: , Rfl:    pregabalin (LYRICA) 50 MG capsule, TAKE 1 CAPSULE BY MOUTH 3 TIMES DAILY, Disp: 90 capsule, Rfl: 0   sertraline (ZOLOFT) 50 MG tablet, TAKE 1 TABLET BY MOUTH TWICE DAILY, Disp: 60 tablet, Rfl: 1   Vitamin D, Ergocalciferol, (DRISDOL) 1.25 MG (50000 UNIT) CAPS capsule, TAKE 1 CAPSULE BY MOUTH WEEKLY, Disp: 12 capsule, Rfl: 3   Water For Irrigation, Sterile (FREE WATER) SOLN, Place 60 mLs into feeding tube every 8 (eight) hours., Disp: 60 mL, Rfl: 0   Allergies  Allergen Reactions   Aspirin Hives and Itching    ROS Review of Systems  Constitutional:  Positive for fatigue. Negative for chills and fever.  HENT:  Negative for facial swelling.   Respiratory:  Positive for chest tightness and shortness of breath. Negative for wheezing.   Cardiovascular:  Positive for palpitations. Negative for chest pain and leg swelling.  Genitourinary:  Negative for difficulty urinating.  Neurological:  Positive for dizziness.     Objective:    Physical Exam Cardiovascular:     Rate and Rhythm: Normal rate.  Pulmonary:     Breath sounds: No rhonchi or rales.  Abdominal:     General: Abdomen is flat. Bowel sounds are normal.  Neurological:     Mental Status:  [name] [name] is alert.    BP (!) 154/63   Pulse 84   Ht 5\' 5"  (1.651 m)   Wt 87 lb 9.6 oz (39.7 kg)   BMI 14.58 kg/m  Wt Readings from Last 3 Encounters:  10/19/20 87 lb 9.6 oz (39.7 kg)  09/09/20 87 lb 3.2 oz (39.6 kg)  09/07/20 85 lb 1.6 oz (38.6 kg)     Health Maintenance Due  Topic Date Due   Hepatitis C Screening  Never done   Zoster Vaccines- Shingrix (1 of 2) Never done   PAP SMEAR-Modifier  Never done   COLONOSCOPY (Pts 45-68yrs Insurance coverage will need to be confirmed)  Never done   COVID-19 Vaccine (4 - Booster for Pfizer series) 01/01/2020    There are no preventive care reminders to display for this patient.  Lab Results  Component Value Date   TSH 0.40 07/07/2020   Lab Results  Component Value Date   WBC 10.7 (H) 08/04/2020   HGB 12.7 08/04/2020   HCT 38.0 08/04/2020   MCV 103.8 (H) 08/04/2020   PLT 272 08/04/2020   Lab Results  Component Value Date   NA 138 08/04/2020   K 4.5 08/04/2020   CO2 26 08/04/2020   GLUCOSE 103 (H) 08/04/2020   BUN 17 08/04/2020   CREATININE 0.54 08/04/2020   BILITOT 0.3 07/16/2020   ALKPHOS 62 04/09/2020   AST 15 07/16/2020   ALT 14 07/16/2020   PROT 6.4 07/16/2020   ALBUMIN 4.0 04/09/2020   CALCIUM 8.4 (L) 08/04/2020   ANIONGAP 4 (L) 08/04/2020   No results found for: CHOL No results found for: HDL No results found for: [facility] Lab Results  Component Value Date   TRIG 107 05/30/2014   No results found for: CHOLHDL No results found for: HGBA1C    Assessment & Plan:   Problem List Items Addressed This Visit       Cardiovascular and Mediastinum   Ischemic bowel disease ([place])   Cardiomyopathy ([place])    Cardiomyopathy stable at the present time      Essential hypertension    Blood pressure is under control on present medication        Respiratory   COPD (chronic obstructive pulmonary disease) ([place]) (Chronic)    Patient was again advised to quit smoking.      Adenocarcinoma of lung [facility])    Patient  is being followed up in [facility] for that        Other   Protein-calorie malnutrition, severe ([place])    Patient was suggested to drink Ensure every day      Other Visit Diagnoses     Need for influenza vaccination    -  Primary   Relevant Orders   Flu Vaccine QUAD 6+ mos PF IM (Fluarix Quad PF) (Completed)       No orders of the defined types were placed in this encounter.   Follow-up: No follow-ups on file.    Cletis Athens, MD

## 2020-10-19 NOTE — Assessment & Plan Note (Signed)
Patient was again advised to quit smoking.

## 2020-10-19 NOTE — Assessment & Plan Note (Signed)
Cardiomyopathy stable at the present time

## 2020-10-21 ENCOUNTER — Other Ambulatory Visit: Payer: Self-pay

## 2020-10-21 ENCOUNTER — Telehealth (INDEPENDENT_AMBULATORY_CARE_PROVIDER_SITE_OTHER): Payer: 59 | Admitting: Cardiology

## 2020-10-21 VITALS — Ht 65.0 in | Wt 89.0 lb

## 2020-10-21 DIAGNOSIS — R55 Syncope and collapse: Secondary | ICD-10-CM

## 2020-10-21 NOTE — Progress Notes (Signed)
Virtual Visit via Telephone Note   This visit type was conducted due to national recommendations for restrictions regarding the COVID-19 Pandemic (e.g. social distancing) in an effort to limit this patient's exposure and mitigate transmission in our community.  Due to her co-morbid illnesses, this patient is at least at moderate risk for complications without adequate follow up.  This format is felt to be most appropriate for this patient at this time.  The patient did not have access to video technology/had technical difficulties with video requiring transitioning to audio format only (telephone).  All issues noted in this document were discussed and addressed.  No physical exam could be performed with this format.  Please refer to the patient's chart for her  consent to telehealth for [facility].    Date:  10/21/2020   ID:  [name] [name], DOB 1955/09/30, MRN [id number] The patient was identified using 2 identifiers.  Patient Location: Home Provider Location: Office/Clinic   PCP:  Cletis Athens, MD   [place] Providers Cardiologist:  None Electrophysiologist:  Vickie Epley, MD     Evaluation Performed:  Follow-Up Visit  Chief Complaint:  syncope  History of Present Illness:    [name] [name] is a 65 y.o. female with syncope who presents for follow up. I last saw the patient 09/09/2020. At the last appointment, I ordered an echo and heart monitor which have returned without significant abnormality. Today, [name] [name] tells me that she has done well. No new problems.   The patient does not have symptoms concerning for COVID-19 infection (fever, chills, cough, or new shortness of breath).    Past Medical History:  Diagnosis Date   Bowel obstruction ([place])    x1 month ago   Cancer ([place])    stomach cancer per pt and currently lung ca   COPD (chronic obstructive pulmonary disease) ([place])    Essential hypertension 12/02/2019   Lung cancer ([place])    Personal history of  radiation therapy 2019-2020   lung ca   Past Surgical History:  Procedure Laterality Date   COLON SURGERY       Current Meds  Medication Sig   acetaminophen (TYLENOL) 160 MG/5ML solution Take 5 mLs (160 mg total) by mouth every 6 (six) hours as needed for mild pain.   albuterol (PROVENTIL) (2.5 MG/3ML) 0.083% nebulizer solution Take 3 mLs (2.5 mg total) by nebulization 3 (three) times daily as needed for wheezing.   atorvastatin (LIPITOR) 40 MG tablet Take 40 mg by mouth daily.   budesonide-formoterol (SYMBICORT) 160-4.5 MCG/ACT inhaler Inhale 1 puff into the lungs 2 (two) times daily.   buPROPion (WELLBUTRIN SR) 100 MG 12 hr tablet Take 1 tablet (100 mg total) by mouth 2 (two) times daily.   cefUROXime (CEFTIN) 500 MG tablet Take 500 mg by mouth 2 (two) times daily with a meal.   cholestyramine (QUESTRAN) 4 GM/DOSE powder TAKE 1 SCOOPFUL(4 GRAMS) MIXED IN 4-8 OUNCES OF LIQUID AND DRINK, DAILY   cyclobenzaprine (FLEXERIL) 5 MG tablet Take 5 mg by mouth 3 (three) times daily as needed for muscle spasms.   cycloSPORINE (RESTASIS) 0.05 % ophthalmic emulsion Place 1 drop into both eyes every 12 (twelve) hours.   feeding supplement, ENSURE ENLIVE, (ENSURE ENLIVE) LIQD Take 237 mLs by mouth 2 (two) times daily between meals.   fluticasone (FLONASE) 50 MCG/ACT nasal spray USE 1 SPRAY INTO EACH NOSTRIL TWICE DAILY   hydrochlorothiazide (MICROZIDE) 12.5 MG capsule Take 12.5 mg by mouth daily.  HYDROcodone-acetaminophen (NORCO/VICODIN) 5-325 MG tablet Take 1 tablet by mouth every 6 (six) hours as needed for moderate pain.   hydrOXYzine (ATARAX/VISTARIL) 25 MG tablet Take 25 mg by mouth 3 (three) times daily as needed.   lipase/protease/amylase (CREON) 12000-38000 units CPEP capsule Take by mouth.   lisinopril (PRINIVIL,ZESTRIL) 40 MG tablet Take 40 mg by mouth daily.   methocarbamol (ROBAXIN) 500 MG tablet Take 1 tablet (500 mg total) by mouth every 8 (eight) hours as needed for muscle spasms.    mirtazapine (REMERON) 15 MG tablet Take 15 mg by mouth daily.   omeprazole (PRILOSEC) 20 MG capsule TAKE 1 CAPSULE BY MOUTH TWICE DAILY   ondansetron (ZOFRAN ODT) 4 MG disintegrating tablet Take 1 tablet (4 mg total) by mouth every 8 (eight) hours as needed.   pantoprazole (PROTONIX) 40 MG tablet TAKE 1 TABLET BY MOUTH ONCE DAILY   predniSONE (DELTASONE) 10 MG tablet Take 1 tablet (10 mg total) by mouth daily. Day 1-3: take 4 tablets PO daily Day 4-6: take 3 tablets PO daily Day 7-9: take 2 tablets PO daily Day 10-12: take 1 tablet PO daily   pregabalin (LYRICA) 100 MG capsule Take 100 mg by mouth 3 (three) times daily.    pregabalin (LYRICA) 50 MG capsule TAKE 1 CAPSULE BY MOUTH 3 TIMES DAILY   sertraline (ZOLOFT) 50 MG tablet TAKE 1 TABLET BY MOUTH TWICE DAILY   Vitamin D, Ergocalciferol, (DRISDOL) 1.25 MG (50000 UNIT) CAPS capsule TAKE 1 CAPSULE BY MOUTH WEEKLY   Water For Irrigation, Sterile (FREE WATER) SOLN Place 60 mLs into feeding tube every 8 (eight) hours.     Allergies:   Aspirin   Social History   Tobacco Use   Smoking status: Every Day    Packs/day: 0.25    Types: Cigarettes   Smokeless tobacco: Never  Vaping Use   Vaping Use: Never used  Substance Use Topics   Alcohol use: Yes    Comment: occas   Drug use: No     Family Hx: The patient's family history includes Lupus in her sister. There is no history of Heart failure or Diabetes.  ROS:   Please see the history of present illness.     All other systems reviewed and are negative.  Prior CV studies:   The following studies were reviewed today:  10/08/2020 Echo personally reviewed LV function normal, 55% RV normal No significant valvular abnormalities  09/30/2020 zio personally reviewed HR 51 - 159 bpm, average 86 bpm.  3 SVT episodes, longest lasting 5 beats at a rate of 136 bpm. Rare supraventricular and ventricular ectopy. No sustained arrhythmias.    Labs/Other Tests and Data Reviewed:    EKG:  No  ECG reviewed.  Recent Labs: 07/07/2020: TSH 0.40 07/16/2020: ALT 14 08/04/2020: BUN 17; Creatinine, Ser 0.54; Hemoglobin 12.7; Platelets 272; Potassium 4.5; Sodium 138   Recent Lipid Panel Lab Results  Component Value Date/Time   TRIG 107 05/30/2014 06:48 AM    Wt Readings from Last 3 Encounters:  10/21/20 89 lb (40.4 kg)  10/19/20 87 lb 9.6 oz (39.7 kg)  09/09/20 87 lb 3.2 oz (39.6 kg)         Objective:    Vital Signs:  Ht 5\' 5"  (1.651 m)   Wt 89 lb (40.4 kg)   BMI 14.81 kg/m    VITAL SIGNS:  reviewed  ASSESSMENT & PLAN:    Syncope No recurrent episodes. Zio and echo have returned without significant abnormality.   I  would recommend she follow up as needed with EP.   Time:   Today, I have spent 8 minutes with the patient with telehealth technology discussing the above problems.     Medication Adjustments/Labs and Tests Ordered: Current medicines are reviewed at length with the patient today.  Concerns regarding medicines are outlined above.   Tests Ordered: No orders of the defined types were placed in this encounter.   Medication Changes: No orders of the defined types were placed in this encounter.   Signed, Vickie Epley, MD  10/21/2020 12:09 PM    [place]

## 2020-10-21 NOTE — Patient Instructions (Signed)

## 2020-10-24 ENCOUNTER — Other Ambulatory Visit: Payer: Self-pay | Admitting: Internal Medicine

## 2020-10-24 DIAGNOSIS — J301 Allergic rhinitis due to pollen: Secondary | ICD-10-CM

## 2020-11-04 ENCOUNTER — Other Ambulatory Visit: Payer: Self-pay | Admitting: Internal Medicine

## 2020-12-02 ENCOUNTER — Other Ambulatory Visit: Payer: Self-pay

## 2020-12-02 ENCOUNTER — Ambulatory Visit (INDEPENDENT_AMBULATORY_CARE_PROVIDER_SITE_OTHER): Payer: 59 | Admitting: Internal Medicine

## 2020-12-02 ENCOUNTER — Encounter: Payer: Self-pay | Admitting: Internal Medicine

## 2020-12-02 VITALS — BP 145/74 | HR 84 | Ht 65.0 in | Wt 87.0 lb

## 2020-12-02 DIAGNOSIS — I10 Essential (primary) hypertension: Secondary | ICD-10-CM | POA: Diagnosis not present

## 2020-12-02 DIAGNOSIS — K559 Vascular disorder of intestine, unspecified: Secondary | ICD-10-CM | POA: Diagnosis not present

## 2020-12-02 DIAGNOSIS — R6889 Other general symptoms and signs: Secondary | ICD-10-CM

## 2020-12-02 DIAGNOSIS — M12812 Other specific arthropathies, not elsewhere classified, left shoulder: Secondary | ICD-10-CM | POA: Diagnosis not present

## 2020-12-02 DIAGNOSIS — J410 Simple chronic bronchitis: Secondary | ICD-10-CM

## 2020-12-02 DIAGNOSIS — Z716 Tobacco abuse counseling: Secondary | ICD-10-CM

## 2020-12-02 LAB — POCT INFLUENZA A/B
Influenza A, POC: NEGATIVE
Influenza B, POC: NEGATIVE

## 2020-12-02 LAB — POC COVID19 BINAXNOW: SARS Coronavirus 2 Ag: NEGATIVE

## 2020-12-02 NOTE — Assessment & Plan Note (Signed)
Stable at the present time.  Sometimes she vomits

## 2020-12-02 NOTE — Assessment & Plan Note (Signed)

## 2020-12-02 NOTE — Assessment & Plan Note (Signed)
Patient has arthritis of the both shoulder

## 2020-12-02 NOTE — Assessment & Plan Note (Signed)
Patient has quit smoking ?

## 2020-12-02 NOTE — Progress Notes (Signed)
Established Patient Office Visit  Subjective:  Patient ID: [name] [name], female    DOB: Mar 06, 1955  Age: 65 y.o. MRN: [id number]  CC:  Chief Complaint  Patient presents with   flu like symptoms     Patient having fatigue, weakness, sob. Patient complains of chills and feels feverish. Has cough, congestion.     HPI  [name] [name] presents for  uri   Past Surgical History:  Procedure Laterality Date   COLON SURGERY      Family History  Problem Relation Age of Onset   Lupus Sister    Heart failure Neg Hx    Diabetes Neg Hx     Social History   Socioeconomic History   Marital status: Widowed    Spouse name: Not on file   Number of children: Not on file   Years of education: Not on file   Highest education level: Not on file  Occupational History   Occupation: on disability  Tobacco Use   Smoking status: Every Day    Packs/day: 0.25    Types: Cigarettes   Smokeless tobacco: Never  Vaping Use   Vaping Use: Never used  Substance and Sexual Activity   Alcohol use: Yes    Comment: occas   Drug use: No   Sexual activity: Yes    Birth control/protection: Post-menopausal  Other Topics Concern   Not on file  Social History Narrative   Not on file   Social Determinants of Health   Financial Resource Strain: Not on file  Food Insecurity: Not on file  Transportation Needs: Not on file  Physical Activity: Not on file  Stress: Not on file  Social Connections: Not on file  Intimate Partner Violence: Not on file     Current Outpatient Medications:    acetaminophen (TYLENOL) 160 MG/5ML solution, Take 5 mLs (160 mg total) by mouth every 6 (six) hours as needed for mild pain., Disp: 120 mL, Rfl: 6   albuterol (PROVENTIL) (2.5 MG/3ML) 0.083% nebulizer solution, Take 3 mLs (2.5 mg total) by nebulization 3 (three) times daily as needed for wheezing., Disp: 75 mL, Rfl: 12   atorvastatin (LIPITOR) 40 MG tablet, Take 40 mg by mouth daily., Disp: , Rfl:    buPROPion  (WELLBUTRIN SR) 100 MG 12 hr tablet, Take 1 tablet (100 mg total) by mouth 2 (two) times daily., Disp: 60 tablet, Rfl: 2   cefUROXime (CEFTIN) 500 MG tablet, Take 500 mg by mouth 2 (two) times daily with a meal., Disp: , Rfl:    cholestyramine (QUESTRAN) 4 GM/DOSE powder, TAKE 1 SCOOPFUL(4 GRAMS) MIXED IN 4-8 OUNCES OF LIQUID AND DRINK, DAILY, Disp: 368.76 g, Rfl: 3   cyclobenzaprine (FLEXERIL) 5 MG tablet, Take 5 mg by mouth 3 (three) times daily as needed for muscle spasms., Disp: , Rfl:    cycloSPORINE (RESTASIS) 0.05 % ophthalmic emulsion, Place 1 drop into both eyes every 12 (twelve) hours., Disp: , Rfl:    feeding supplement, ENSURE ENLIVE, (ENSURE ENLIVE) LIQD, Take 237 mLs by mouth 2 (two) times daily between meals., Disp: 237 mL, Rfl: 12   fluticasone (FLONASE) 50 MCG/ACT nasal spray, USE 1 SPRAY INTO EACH NOSTRIL TWICE DAILY, Disp: 16 g, Rfl: 6   hydrochlorothiazide (MICROZIDE) 12.5 MG capsule, Take 12.5 mg by mouth daily., Disp: , Rfl:    HYDROcodone-acetaminophen (NORCO/VICODIN) 5-325 MG tablet, Take 1 tablet by mouth every 6 (six) hours as needed for moderate pain., Disp: 15 tablet, Rfl: 0   hydrOXYzine (  ATARAX/VISTARIL) 25 MG tablet, Take 25 mg by mouth 3 (three) times daily as needed., Disp: , Rfl:    lipase/protease/amylase (CREON) 12000-38000 units CPEP capsule, Take by mouth., Disp: , Rfl:    lisinopril (PRINIVIL,ZESTRIL) 40 MG tablet, Take 40 mg by mouth daily., Disp: , Rfl:    methocarbamol (ROBAXIN) 500 MG tablet, Take 1 tablet (500 mg total) by mouth every 8 (eight) hours as needed for muscle spasms., Disp: 12 tablet, Rfl: 0   mirtazapine (REMERON) 15 MG tablet, Take 15 mg by mouth daily., Disp: , Rfl:    omeprazole (PRILOSEC) 20 MG capsule, TAKE 1 CAPSULE BY MOUTH TWICE DAILY, Disp: 180 capsule, Rfl: 0   ondansetron (ZOFRAN ODT) 4 MG disintegrating tablet, Take 1 tablet (4 mg total) by mouth every 8 (eight) hours as needed., Disp: 20 tablet, Rfl: 0   pantoprazole (PROTONIX) 40  MG tablet, TAKE 1 TABLET BY MOUTH ONCE DAILY, Disp: 30 tablet, Rfl: 1   predniSONE (DELTASONE) 10 MG tablet, Take 1 tablet (10 mg total) by mouth daily. Day 1-3: take 4 tablets PO daily Day 4-6: take 3 tablets PO daily Day 7-9: take 2 tablets PO daily Day 10-12: take 1 tablet PO daily, Disp: 30 tablet, Rfl: 0   pregabalin (LYRICA) 100 MG capsule, Take 100 mg by mouth 3 (three) times daily. , Disp: , Rfl:    pregabalin (LYRICA) 50 MG capsule, TAKE 1 CAPSULE BY MOUTH 3 TIMES DAILY, Disp: 90 capsule, Rfl: 0   sertraline (ZOLOFT) 50 MG tablet, TAKE 1 TABLET BY MOUTH TWICE DAILY, Disp: 60 tablet, Rfl: 1   SYMBICORT 160-4.5 MCG/ACT inhaler, INHALE 2 PUFFS TWICE A DAY RINSE MOUTH WITH WATER AFTER EACH USE, Disp: 10.2 g, Rfl: 3   Vitamin D, Ergocalciferol, (DRISDOL) 1.25 MG (50000 UNIT) CAPS capsule, TAKE 1 CAPSULE BY MOUTH WEEKLY, Disp: 12 capsule, Rfl: 3   Water For Irrigation, Sterile (FREE WATER) SOLN, Place 60 mLs into feeding tube every 8 (eight) hours., Disp: 60 mL, Rfl: 0   Allergies  Allergen Reactions   Aspirin Hives and Itching    ROS Review of Systems  Constitutional: Negative.   HENT:  Positive for congestion. Negative for ear pain, mouth sores and sinus pain.   Eyes: Negative.  Negative for discharge.  Respiratory: Negative.    Cardiovascular: Negative.   Gastrointestinal: Negative.   Endocrine: Negative.   Genitourinary: Negative.   Musculoskeletal: Negative.   Skin: Negative.   Allergic/Immunologic: Negative.   Neurological: Negative.   Hematological: Negative.   Psychiatric/Behavioral: Negative.    All other systems reviewed and are negative.    Objective:    Physical Exam Vitals reviewed.  Constitutional:      Appearance: Normal appearance.  HENT:     Mouth/Throat:     Mouth: Mucous membranes are moist.  Eyes:     Pupils: Pupils are equal, round, and reactive to light.  Neck:     Vascular: No carotid bruit.  Cardiovascular:     Rate and Rhythm: Normal rate and  regular rhythm.     Pulses: Normal pulses.     Heart sounds: Normal heart sounds.  Pulmonary:     Effort: Pulmonary effort is normal.     Breath sounds: Normal breath sounds.  Abdominal:     General: Bowel sounds are normal.     Palpations: Abdomen is soft. There is no hepatomegaly, splenomegaly or mass.     Tenderness: There is no abdominal tenderness.     Hernia: No hernia  is present.  Musculoskeletal:        General: No tenderness.     Cervical back: Neck supple.     Right lower leg: No edema.     Left lower leg: No edema.  Skin:    Findings: No rash.  Neurological:     Mental Status: She is alert and oriented to person, place, and time.     Motor: No weakness.  Psychiatric:        Mood and Affect: Mood and affect normal.        Behavior: Behavior normal.    BP (!) 145/74   Pulse 84   Ht 5\' 5"  (1.651 m)   Wt 87 lb (39.5 kg)   BMI 14.48 kg/m  Wt Readings from Last 3 Encounters:  12/02/20 87 lb (39.5 kg)  10/21/20 89 lb (40.4 kg)  10/19/20 87 lb 9.6 oz (39.7 kg)     Health Maintenance Due  Topic Date Due   Hepatitis C Screening  Never done   Zoster Vaccines- Shingrix (1 of 2) Never done   PAP SMEAR-Modifier  Never done   COLONOSCOPY (Pts 45-36yrs Insurance coverage will need to be confirmed)  Never done   Pneumococcal Vaccine 59-26 Years old (3 - PCV) 01/11/2018    There are no preventive care reminders to display for this patient.  Lab Results  Component Value Date   TSH 0.40 07/07/2020   Lab Results  Component Value Date   WBC 10.7 (H) 08/04/2020   HGB 12.7 08/04/2020   HCT 38.0 08/04/2020   MCV 103.8 (H) 08/04/2020   PLT 272 08/04/2020   Lab Results  Component Value Date   NA 138 08/04/2020   K 4.5 08/04/2020   CO2 26 08/04/2020   GLUCOSE 103 (H) 08/04/2020   BUN 17 08/04/2020   CREATININE 0.54 08/04/2020   BILITOT 0.3 07/16/2020   ALKPHOS 62 04/09/2020   AST 15 07/16/2020   ALT 14 07/16/2020   PROT 6.4 07/16/2020   ALBUMIN 4.0 04/09/2020    CALCIUM 8.4 (L) 08/04/2020   ANIONGAP 4 (L) 08/04/2020   No results found for: CHOL No results found for: HDL No results found for: [facility] Lab Results  Component Value Date   TRIG 107 05/30/2014   No results found for: CHOLHDL No results found for: HGBA1C    Assessment & Plan:   Problem List Items Addressed This Visit       Cardiovascular and Mediastinum   Ischemic bowel disease ([place])    Stable at the present time.  Sometimes she vomits      Essential hypertension    The following hypertensive lifestyle modification were recommended and discussed:  1. Limiting alcohol intake to less than 1 oz/day of ethanol:(24 oz of beer or 8 oz of wine or 2 oz of 100-proof whiskey). 2. Take baby ASA 81 mg daily. 3. Importance of regular aerobic exercise and losing weight. 4. Reduce dietary saturated fat and cholesterol intake for overall cardiovascular health. 5. Maintaining adequate dietary potassium, calcium, and magnesium intake. 6. Regular monitoring of the blood pressure. 7. Reduce sodium intake to less than 100 mmol/day (less than 2.3 gm of sodium or less than 6 gm of sodium choride)         Respiratory   COPD (chronic obstructive pulmonary disease) (HCC) (Chronic)    Patient is a chronic COPD does not smoke anymore  COPD is a general term that can be used to describe many different lung problems that  cause lung inflammation and limit airflow. This includes chronic bronchitis and emphysema.  Exercise and physical activity improve your shortness of breath by increasing blood flow (circulation). This causes your heart to provide more oxygen to your body.  Contact your health care provider before starting any exercise program or new activity. Ask your health care provider what exercises and activities are safe for you.        Musculoskeletal and Integument   Rotator cuff arthropathy of left shoulder    Patient has arthritis of the both shoulder        Other   Tobacco  abuse counseling    Patient has quit smoking      Other Visit Diagnoses     Flu-like symptoms    -  Primary   Relevant Orders   POCT Influenza A/B (Completed)   POC COVID-19 (Completed)       No orders of the defined types were placed in this encounter.   Follow-up: No follow-ups on file.    Cletis Athens, MD

## 2020-12-02 NOTE — Assessment & Plan Note (Signed)
Patient is a chronic COPD does not smoke anymore  COPD is a general term that can be used to describe many different lung problems that cause lung inflammation and limit airflow. This includes chronic bronchitis and emphysema.  Exercise and physical activity improve your shortness of breath by increasing blood flow (circulation). This causes your heart to provide more oxygen to your body.  Contact your health care provider before starting any exercise program or new activity. Ask your health care provider what exercises and activities are safe for you.

## 2020-12-03 ENCOUNTER — Other Ambulatory Visit: Payer: Self-pay | Admitting: Internal Medicine

## 2020-12-03 DIAGNOSIS — R079 Chest pain, unspecified: Secondary | ICD-10-CM

## 2020-12-08 ENCOUNTER — Other Ambulatory Visit: Payer: Self-pay | Admitting: *Deleted

## 2020-12-08 MED ORDER — LISINOPRIL 40 MG PO TABS: 40.0000 mg | ORAL_TABLET | Freq: Every day | ORAL | 3 refills | Status: DC

## 2020-12-11 ENCOUNTER — Other Ambulatory Visit: Payer: Self-pay | Admitting: Internal Medicine

## 2020-12-15 ENCOUNTER — Ambulatory Visit: Payer: 59 | Admitting: Internal Medicine

## 2020-12-30 ENCOUNTER — Other Ambulatory Visit: Payer: Self-pay

## 2021-01-11 ENCOUNTER — Ambulatory Visit (INDEPENDENT_AMBULATORY_CARE_PROVIDER_SITE_OTHER): Payer: 59 | Admitting: *Deleted

## 2021-01-11 DIAGNOSIS — Z0000 Encounter for general adult medical examination without abnormal findings: Secondary | ICD-10-CM | POA: Diagnosis not present

## 2021-01-11 DIAGNOSIS — Z124 Encounter for screening for malignant neoplasm of cervix: Secondary | ICD-10-CM

## 2021-01-11 DIAGNOSIS — Z1211 Encounter for screening for malignant neoplasm of colon: Secondary | ICD-10-CM | POA: Diagnosis not present

## 2021-01-11 MED ORDER — SERTRALINE HCL 50 MG PO TABS: 50.0000 mg | ORAL_TABLET | Freq: Two times a day (BID) | ORAL | 1 refills | Status: DC

## 2021-01-11 MED ORDER — AZITHROMYCIN 250 MG PO TABS: ORAL_TABLET | ORAL | 0 refills | Status: AC

## 2021-01-11 MED ORDER — METHYLPREDNISOLONE 4 MG PO TBPK: ORAL_TABLET | ORAL | 0 refills | Status: DC

## 2021-01-11 MED ORDER — ZOSTER VAC RECOMB ADJUVANTED 50 MCG/0.5ML IM SUSR: 0.5000 mL | Freq: Once | INTRAMUSCULAR | 1 refills | Status: AC

## 2021-01-11 MED ORDER — BENZONATATE 100 MG PO CAPS: 200.0000 mg | ORAL_CAPSULE | Freq: Three times a day (TID) | ORAL | 0 refills | Status: DC | PRN

## 2021-01-11 NOTE — Progress Notes (Signed)
I have reviewed this visit and agree with the documentation.   

## 2021-01-11 NOTE — Progress Notes (Signed)
Subjective:   [name] [name] is a 65 y.o. female who presents for an Initial Medicare Annual Wellness Visit.  I discussed the limitations of evaluation and management by telemedicine and the availability of in person appointments. Patient expressed understanding and agreed to proceed.   Visit performed using audio  Patient:home Provider:home   Review of Systems    Defer to provider Cardiac Risk Factors include: hypertension;smoking/ tobacco exposure     Objective:    There were no vitals filed for this visit. There is no height or weight on file to calculate BMI.  Advanced Directives 01/11/2021 08/04/2020 04/09/2020 03/20/2020 03/06/2020 09/04/2019 08/30/2019  Does Patient Have a Medical Advance Directive? No No No No No No No  Would patient like information on creating a medical advance directive? No - Patient declined - - No - Patient declined No - Patient declined - No - Patient declined    Current Medications (verified) Outpatient Encounter Medications as of 01/11/2021  Medication Sig   acetaminophen (TYLENOL) 160 MG/5ML solution Take 5 mLs (160 mg total) by mouth every 6 (six) hours as needed for mild pain.   albuterol (PROVENTIL) (2.5 MG/3ML) 0.083% nebulizer solution Take 3 mLs (2.5 mg total) by nebulization 3 (three) times daily as needed for wheezing.   atorvastatin (LIPITOR) 40 MG tablet Take 40 mg by mouth daily.   azithromycin (ZITHROMAX) 250 MG tablet Take 2 tablets on day 1, then 1 tablet daily on days 2 through 5   benzonatate (TESSALON PERLES) 100 MG capsule Take 2 capsules (200 mg total) by mouth 3 (three) times daily as needed for cough.   buPROPion (WELLBUTRIN SR) 100 MG 12 hr tablet Take 1 tablet (100 mg total) by mouth 2 (two) times daily.   cefUROXime (CEFTIN) 500 MG tablet Take 500 mg by mouth 2 (two) times daily with a meal.   cholestyramine (QUESTRAN) 4 GM/DOSE powder TAKE 1 SCOOPFUL(4 GRAMS) MIXED IN 4-8 OUNCES OF LIQUID AND DRINK, DAILY   cyclobenzaprine  (FLEXERIL) 5 MG tablet Take 5 mg by mouth 3 (three) times daily as needed for muscle spasms.   cycloSPORINE (RESTASIS) 0.05 % ophthalmic emulsion Place 1 drop into both eyes every 12 (twelve) hours.   feeding supplement, ENSURE ENLIVE, (ENSURE ENLIVE) LIQD Take 237 mLs by mouth 2 (two) times daily between meals.   fluticasone (FLONASE) 50 MCG/ACT nasal spray USE 1 SPRAY INTO EACH NOSTRIL TWICE DAILY   hydrochlorothiazide (MICROZIDE) 12.5 MG capsule Take 12.5 mg by mouth daily.   HYDROcodone-acetaminophen (NORCO/VICODIN) 5-325 MG tablet Take 1 tablet by mouth every 6 (six) hours as needed for moderate pain.   hydrOXYzine (ATARAX/VISTARIL) 25 MG tablet Take 25 mg by mouth 3 (three) times daily as needed.   lipase/protease/amylase (CREON) 12000-38000 units CPEP capsule Take by mouth.   lisinopril (ZESTRIL) 40 MG tablet Take 1 tablet (40 mg total) by mouth daily.   methocarbamol (ROBAXIN) 500 MG tablet Take 1 tablet (500 mg total) by mouth every 8 (eight) hours as needed for muscle spasms.   methylPREDNISolone (MEDROL DOSEPAK) 4 MG TBPK tablet As directed   mirtazapine (REMERON) 15 MG tablet Take 15 mg by mouth daily.   omeprazole (PRILOSEC) 20 MG capsule TAKE 1 CAPSULE BY MOUTH TWICE DAILY   ondansetron (ZOFRAN ODT) 4 MG disintegrating tablet Take 1 tablet (4 mg total) by mouth every 8 (eight) hours as needed.   pantoprazole (PROTONIX) 40 MG tablet TAKE 1 TABLET BY MOUTH ONCE DAILY   predniSONE (DELTASONE) 10 MG tablet  Take 1 tablet (10 mg total) by mouth daily. Day 1-3: take 4 tablets PO daily Day 4-6: take 3 tablets PO daily Day 7-9: take 2 tablets PO daily Day 10-12: take 1 tablet PO daily   pregabalin (LYRICA) 100 MG capsule Take 100 mg by mouth 3 (three) times daily.    SYMBICORT 160-4.5 MCG/ACT inhaler INHALE 2 PUFFS TWICE A DAY RINSE MOUTH WITH WATER AFTER EACH USE   Vitamin D, Ergocalciferol, (DRISDOL) 1.25 MG (50000 UNIT) CAPS capsule TAKE 1 CAPSULE BY MOUTH WEEKLY   Water For Irrigation,  Sterile (FREE WATER) SOLN Place 60 mLs into feeding tube every 8 (eight) hours.   Zoster Vaccine Adjuvanted [facility]) injection Inject 0.5 mLs into the muscle once for 1 dose. Please administer shingles vaccine and fax to provider at [phone]   [DISCONTINUED] pregabalin (LYRICA) 50 MG capsule TAKE 1 CAPSULE BY MOUTH 3 TIMES DAILY   [DISCONTINUED] sertraline (ZOLOFT) 50 MG tablet TAKE 1 TABLET BY MOUTH TWICE DAILY   sertraline (ZOLOFT) 50 MG tablet Take 1 tablet (50 mg total) by mouth 2 (two) times daily.   No facility-administered encounter medications on file as of 01/11/2021.    Allergies (verified) Aspirin   History: Past Medical History:  Diagnosis Date   Bowel obstruction ([place])    x1 month ago   Cancer [facility])    stomach cancer per pt and currently lung ca   COPD (chronic obstructive pulmonary disease) ([place])    Essential hypertension 12/02/2019   Lung cancer ([place])    Personal history of radiation therapy 2019-2020   lung ca   Past Surgical History:  Procedure Laterality Date   COLON SURGERY     Family History  Problem Relation Age of Onset   Lupus Sister    Heart failure Neg Hx    Diabetes Neg Hx    Social History   Socioeconomic History   Marital status: Widowed    Spouse name: Not on file   Number of children: 2   Years of education: 47   Highest education level: Not on file  Occupational History   Occupation: on disability  Tobacco Use   Smoking status: Every Day    Packs/day: 0.25    Types: Cigarettes   Smokeless tobacco: Never  Vaping Use   Vaping Use: Never used  Substance and Sexual Activity   Alcohol use: Yes    Comment: occas   Drug use: No   Sexual activity: Not Currently    Birth control/protection: Post-menopausal  Other Topics Concern   Not on file  Social History Narrative   Not on file   Social Determinants of Health   Financial Resource Strain: High Risk   Difficulty of Paying Living Expenses: Hard  Food Insecurity: Food  Insecurity Present   Worried About Running Out of Food in the Last Year: Sometimes true   Ran Out of Food in the Last Year: Sometimes true  Transportation Needs: Unmet Transportation Needs   Lack of Transportation (Medical): Yes   Lack of Transportation (Non-Medical): Yes  Physical Activity: Insufficiently Active   Days of Exercise per Week: 2 days   Minutes of Exercise per Session: 30 min  Stress: Stress Concern Present   Feeling of Stress : To some extent  Social Connections: Socially Isolated   Frequency of Communication with Friends and Family: Once a week   Frequency of Social Gatherings with Friends and Family: Once a week   Attends Religious Services: Never   Retail buyer of [employer]  or Organizations: No   Attends Archivist Meetings: Never   Marital Status: Widowed    Tobacco Counseling Ready to quit: Not Answered Counseling given: Not Answered   Clinical Intake:  Pre-visit preparation completed: Yes  Pain : No/denies pain     Nutritional Risks: None Diabetes: No  How often do you need to have someone help you when you read instructions, pamphlets, or other written materials from your doctor or pharmacy?: 1 - Never What is the last grade level you completed in school?: 11  Diabetic?no  Interpreter Needed?: No  Information entered by :: Dominica Severin   Activities of Daily Living In your present state of health, do you have any difficulty performing the following activities: 01/11/2021  Hearing? N  Vision? N  Difficulty concentrating or making decisions? N  Walking or climbing stairs? N  Dressing or bathing? N  Doing errands, shopping? N  Preparing Food and eating ? Y  Using the Toilet? N  In the past six months, have you accidently leaked urine? N  Do you have problems with loss of bowel control? N  Managing your Medications? Y  Managing your Finances? N  Housekeeping or managing your Housekeeping? N  Some recent data might be hidden     Patient Care Team: Cletis Athens, MD as PCP - General (Internal Medicine) Vickie Epley, MD as PCP - Electrophysiology (Cardiology)  Indicate any recent Medical Services you may have received from other than [facility] providers in the past year (date may be approximate).     Assessment:   This is a routine wellness examination for [facility].  Hearing/Vision screen No results found.  Dietary issues and exercise activities discussed:     Goals Addressed   None    Depression Screen PHQ 2/9 Scores 01/11/2021 08/11/2020  PHQ - 2 Score 2 2  PHQ- 9 Score - 6    Fall Risk Fall Risk  01/11/2021 12/02/2020 10/19/2020  Falls in the past year? 1 1 1   Number falls in past yr: 1 1 1   Injury with Fall? 1 0 0  Risk for fall due to : History of fall(s) History of fall(s) History of fall(s)  Follow up Falls evaluation completed Falls evaluation completed Falls evaluation completed    [place]:  Any stairs in or around the home? No  If so, are there any without handrails? No  Home free of loose throw rugs in walkways, pet beds, electrical cords, etc? Yes  Adequate lighting in your home to reduce risk of falls? Yes   ASSISTIVE DEVICES UTILIZED TO PREVENT FALLS:  Life alert? No  Use of a cane, walker or w/c? Yes sometimes  Grab bars in the bathroom? No  Shower chair or bench in shower? No  Elevated toilet seat or a handicapped toilet? No   TIMED UP AND GO:  Was the test performed? No .      Cognitive Function: MMSE - Mini Mental State Exam 01/11/2021  Not completed: Unable to complete     6CIT Screen 01/11/2021  What Year? 0 points  What month? 0 points  What time? 0 points  Count back from 20 2 points  Months in reverse 4 points  Repeat phrase 4 points  Total Score 10    Immunizations Immunization History  Administered Date(s) Administered   Influenza,inj,Quad PF,6+ Mos 10/16/2014, 01/11/2017, 12/25/2018, 10/09/2019, 10/19/2020    Influenza-Unspecified 10/19/2012, 12/05/2013, 10/16/2014, 11/06/2015   PFIZER(Purple Top)SARS-COV-2 Vaccination 04/21/2019, 06/05/2019  Pneumococcal Polysaccharide-23 07/03/2014, 01/11/2017   Tdap 11/13/2019    TDAP status: Up to date  Flu Vaccine status: Up to date  Pneumococcal vaccine status: Declined,  Education has been provided regarding the importance of this vaccine but patient still declined. Advised may receive this vaccine at local pharmacy or Health Dept. Aware to provide a copy of the vaccination record if obtained from local pharmacy or Health Dept. Verbalized acceptance and understanding.   Covid-19 vaccine status: Declined, Education has been provided regarding the importance of this vaccine but patient still declined. Advised may receive this vaccine at local pharmacy or Health Dept.or vaccine clinic. Aware to provide a copy of the vaccination record if obtained from local pharmacy or Health Dept. Verbalized acceptance and understanding.  Qualifies for Shingles Vaccine? Yes   Zostavax completed No   Shingrix Completed?: No.    Education has been provided regarding the importance of this vaccine. Patient has been advised to call insurance company to determine out of pocket expense if they have not yet received this vaccine. Advised may also receive vaccine at local pharmacy or Health Dept. Verbalized acceptance and understanding.  Screening Tests Health Maintenance  Topic Date Due   Hepatitis C Screening  Never done   Zoster Vaccines- Shingrix (1 of 2) Never done   PAP SMEAR-Modifier  Never done   COLONOSCOPY (Pts 45-82yrs Insurance coverage will need to be confirmed)  Never done   Pneumococcal Vaccine 45-53 Years old (3 - PCV) 01/11/2022 (Originally 01/11/2018)   MAMMOGRAM  05/12/2021   TETANUS/TDAP  11/12/2029   INFLUENZA VACCINE  Completed   COVID-19 Vaccine  Completed   HIV Screening  Completed   HPV VACCINES  Aged Out    Health Maintenance  Health  Maintenance Due  Topic Date Due   Hepatitis C Screening  Never done   Zoster Vaccines- Shingrix (1 of 2) Never done   PAP SMEAR-Modifier  Never done   COLONOSCOPY (Pts 45-100yrs Insurance coverage will need to be confirmed)  Never done    Colorectal cancer screening: Referral to GI placed today. Pt aware the office will call re: appt.  Mammogram status: Completed 04/2019. Repeat every year    Lung Cancer Screening: (Low Dose CT Chest recommended if Age 40-80 years, 30 pack-year currently smoking OR have quit w/in 15years.) does qualify.   Lung Cancer Screening Referral: Patient had ct of chest on 08/04/2020  Additional Screening:  Hepatitis C Screening: does qualify;  Vision Screening: Recommended annual ophthalmology exams for early detection of glaucoma and other disorders of the eye. Is the patient up to date with their annual eye exam?  Yes  Who is the provider or what is the name of the office in which the patient attends annual eye exams? [facility] If pt is not established with a provider, would they like to be referred to a provider to establish care?  Patient has apt  03/18/2021  Dental Screening: Recommended annual dental exams for proper oral hygiene  Community Resource Referral / Chronic Care Management: CRR required this visit?  No   CCM required this visit?  No      Plan:     I have personally reviewed and noted the following in the patients chart:   Medical and social history Use of alcohol, tobacco or illicit drugs  Current medications and supplements including opioid prescriptions. Patient is currently taking opioid prescriptions. Information provided to patient regarding non-opioid alternatives. Patient advised to discuss non-opioid treatment plan with their provider. Functional  ability and status Nutritional status Physical activity Advanced directives List of other physicians Hospitalizations, surgeries, and ER visits in previous 12  months Vitals Screenings to include cognitive, depression, and falls Referrals and appointments  In addition, I have reviewed and discussed with patient certain preventive protocols, quality metrics, and best practice recommendations. A written personalized care plan for preventive services as well as general preventive health recommendations were provided to patient.     Lacretia Nicks, [place]   01/11/2021   Nurse Notes:  Ms. Belleau , Thank you for taking time to come for your Medicare Wellness Visit. I appreciate your ongoing commitment to your health goals. Please review the following plan we discussed and let me know if I can assist you in the future.   These are the goals we discussed:  Goals   None     This is a list of the screening recommended for you and due dates:  Health Maintenance  Topic Date Due   Hepatitis C Screening: USPSTF Recommendation to screen - Ages 73-79 yo.  Never done   Zoster (Shingles) Vaccine (1 of 2) Never done   Pap Smear  Never done   Colon Cancer Screening  Never done   Pneumococcal Vaccination (3 - PCV) 01/11/2022*   Mammogram  05/12/2021   Tetanus Vaccine  11/12/2029   Flu Shot  Completed   COVID-19 Vaccine  Completed   HIV Screening  Completed   HPV Vaccine  Aged Out  *Topic was postponed. The date shown is not the original due date.       Time spent 45 min

## 2021-01-19 ENCOUNTER — Other Ambulatory Visit: Payer: Self-pay

## 2021-01-19 DIAGNOSIS — Z1211 Encounter for screening for malignant neoplasm of colon: Secondary | ICD-10-CM

## 2021-01-19 MED ORDER — PEG 3350-KCL-NA BICARB-NACL 420 G PO SOLR: 4000.0000 mL | Freq: Once | ORAL | 0 refills | Status: AC

## 2021-01-19 NOTE — Progress Notes (Signed)
Gastroenterology Pre-Procedure Review  Request Date: 02/11/2021 Requesting Physician: Dr. Allen Norris  PATIENT REVIEW QUESTIONS: The patient responded to the following health history questions as indicated:    1. Are you having any GI issues? Yes, weight loss due to short bowel syndrome. Ask patient is she needs an office visit first, verbalized no. 2. Do you have a personal history of Polyps?  unsure 3. Do you have a family history of Colon Cancer or Polyps? yes (Father- colon cancer) 4. Diabetes Mellitus? no 5. Joint replacements in the past 12 months?no 6. Major health problems in the past 3 months?no 7. Any artificial heart valves, MVP, or defibrillator?no    MEDICATIONS & ALLERGIES:    Patient reports the following regarding taking any anticoagulation/antiplatelet therapy:   Plavix, Coumadin, Eliquis, Xarelto, Lovenox, Pradaxa, Brilinta, or Effient? no Aspirin? no  Patient confirms/reports the following medications:  Current Outpatient Medications  Medication Sig Dispense Refill   acetaminophen (TYLENOL) 160 MG/5ML solution Take 5 mLs (160 mg total) by mouth every 6 (six) hours as needed for mild pain. 120 mL 6   albuterol (PROVENTIL) (2.5 MG/3ML) 0.083% nebulizer solution Take 3 mLs (2.5 mg total) by nebulization 3 (three) times daily as needed for wheezing. 75 mL 12   atorvastatin (LIPITOR) 40 MG tablet Take 40 mg by mouth daily.     benzonatate (TESSALON PERLES) 100 MG capsule Take 2 capsules (200 mg total) by mouth 3 (three) times daily as needed for cough. 90 capsule 0   buPROPion (WELLBUTRIN SR) 100 MG 12 hr tablet Take 1 tablet (100 mg total) by mouth 2 (two) times daily. 60 tablet 2   cefUROXime (CEFTIN) 500 MG tablet Take 500 mg by mouth 2 (two) times daily with a meal.     cholestyramine (QUESTRAN) 4 GM/DOSE powder TAKE 1 SCOOPFUL(4 GRAMS) MIXED IN 4-8 OUNCES OF LIQUID AND DRINK, DAILY 368.76 g 3   cyclobenzaprine (FLEXERIL) 5 MG tablet Take 5 mg by mouth 3 (three) times daily  as needed for muscle spasms.     cycloSPORINE (RESTASIS) 0.05 % ophthalmic emulsion Place 1 drop into both eyes every 12 (twelve) hours.     feeding supplement, ENSURE ENLIVE, (ENSURE ENLIVE) LIQD Take 237 mLs by mouth 2 (two) times daily between meals. 237 mL 12   fluticasone (FLONASE) 50 MCG/ACT nasal spray USE 1 SPRAY INTO EACH NOSTRIL TWICE DAILY 16 g 6   hydrochlorothiazide (MICROZIDE) 12.5 MG capsule Take 12.5 mg by mouth daily.     HYDROcodone-acetaminophen (NORCO/VICODIN) 5-325 MG tablet Take 1 tablet by mouth every 6 (six) hours as needed for moderate pain. 15 tablet 0   hydrOXYzine (ATARAX/VISTARIL) 25 MG tablet Take 25 mg by mouth 3 (three) times daily as needed.     lipase/protease/amylase (CREON) 12000-38000 units CPEP capsule Take by mouth.     lisinopril (ZESTRIL) 40 MG tablet Take 1 tablet (40 mg total) by mouth daily. 90 tablet 3   methocarbamol (ROBAXIN) 500 MG tablet Take 1 tablet (500 mg total) by mouth every 8 (eight) hours as needed for muscle spasms. 12 tablet 0   methylPREDNISolone (MEDROL DOSEPAK) 4 MG TBPK tablet As directed 21 each 0   mirtazapine (REMERON) 15 MG tablet Take 15 mg by mouth daily.     omeprazole (PRILOSEC) 20 MG capsule TAKE 1 CAPSULE BY MOUTH TWICE DAILY 180 capsule 0   ondansetron (ZOFRAN ODT) 4 MG disintegrating tablet Take 1 tablet (4 mg total) by mouth every 8 (eight) hours as needed. 20 tablet 0  pantoprazole (PROTONIX) 40 MG tablet TAKE 1 TABLET BY MOUTH ONCE DAILY 30 tablet 1   predniSONE (DELTASONE) 10 MG tablet Take 1 tablet (10 mg total) by mouth daily. Day 1-3: take 4 tablets PO daily Day 4-6: take 3 tablets PO daily Day 7-9: take 2 tablets PO daily Day 10-12: take 1 tablet PO daily 30 tablet 0   pregabalin (LYRICA) 100 MG capsule Take 100 mg by mouth 3 (three) times daily.      sertraline (ZOLOFT) 50 MG tablet Take 1 tablet (50 mg total) by mouth 2 (two) times daily. 60 tablet 1   SYMBICORT 160-4.5 MCG/ACT inhaler INHALE 2 PUFFS TWICE A DAY  RINSE MOUTH WITH WATER AFTER EACH USE 10.2 g 3   Vitamin D, Ergocalciferol, (DRISDOL) 1.25 MG (50000 UNIT) CAPS capsule TAKE 1 CAPSULE BY MOUTH WEEKLY 12 capsule 3   Water For Irrigation, Sterile (FREE WATER) SOLN Place 60 mLs into feeding tube every 8 (eight) hours. 60 mL 0   No current facility-administered medications for this visit.    Patient confirms/reports the following allergies:  Allergies  Allergen Reactions   Aspirin Hives and Itching    No orders of the defined types were placed in this encounter.   AUTHORIZATION INFORMATION Primary Insurance: 1D#: Group #:  Secondary Insurance: 1D#: Group #:  SCHEDULE INFORMATION: Date: 02/11/2021 Time: Location: [place]

## 2021-01-29 ENCOUNTER — Encounter: Payer: Self-pay | Admitting: Obstetrics and Gynecology

## 2021-02-03 ENCOUNTER — Other Ambulatory Visit: Payer: Self-pay | Admitting: Internal Medicine

## 2021-02-08 ENCOUNTER — Ambulatory Visit (INDEPENDENT_AMBULATORY_CARE_PROVIDER_SITE_OTHER): Payer: Commercial Managed Care - HMO | Admitting: Internal Medicine

## 2021-02-08 ENCOUNTER — Encounter: Payer: Self-pay | Admitting: Internal Medicine

## 2021-02-08 ENCOUNTER — Other Ambulatory Visit: Payer: Self-pay

## 2021-02-08 VITALS — BP 156/72 | HR 88 | Ht 65.0 in | Wt 87.6 lb

## 2021-02-08 DIAGNOSIS — R55 Syncope and collapse: Secondary | ICD-10-CM

## 2021-02-08 DIAGNOSIS — Z716 Tobacco abuse counseling: Secondary | ICD-10-CM

## 2021-02-08 DIAGNOSIS — R627 Adult failure to thrive: Secondary | ICD-10-CM

## 2021-02-08 DIAGNOSIS — C3490 Malignant neoplasm of unspecified part of unspecified bronchus or lung: Secondary | ICD-10-CM

## 2021-02-08 DIAGNOSIS — J410 Simple chronic bronchitis: Secondary | ICD-10-CM

## 2021-02-08 DIAGNOSIS — R42 Dizziness and giddiness: Secondary | ICD-10-CM | POA: Diagnosis not present

## 2021-02-08 DIAGNOSIS — I10 Essential (primary) hypertension: Secondary | ICD-10-CM

## 2021-02-08 NOTE — Assessment & Plan Note (Signed)
Stop smoking

## 2021-02-08 NOTE — Assessment & Plan Note (Signed)
No arrhythmia noticed on the   EKG

## 2021-02-08 NOTE — Assessment & Plan Note (Signed)
Ref to [facility]

## 2021-02-08 NOTE — Assessment & Plan Note (Signed)
Patient was advised to continue current supplementation, she has a short gut syndrome

## 2021-02-08 NOTE — Assessment & Plan Note (Signed)
Advised to quit smoking

## 2021-02-08 NOTE — Progress Notes (Signed)
Established Patient Office Visit  Subjective:  Patient ID: [name] [name], female    DOB: 29-Jan-1955  Age: 66 y.o. MRN: [id number]  CC:  Chief Complaint  Patient presents with   Follow-up    HPI  [name] [name] presents for check up, she still smokes, passed out sat   Past Medical History:  Diagnosis Date   Bowel obstruction ([place])    x1 month ago   Cancer [facility])    stomach cancer per pt and currently lung ca   COPD (chronic obstructive pulmonary disease) ([place])    Essential hypertension 12/02/2019   Lung cancer ([place])    Personal history of radiation therapy 2019-2020   lung ca    Past Surgical History:  Procedure Laterality Date   COLON SURGERY      Family History  Problem Relation Age of Onset   Lupus Sister    Heart failure Neg Hx    Diabetes Neg Hx     Social History   Socioeconomic History   Marital status: Widowed    Spouse name: Not on file   Number of children: 2   Years of education: 71   Highest education level: Not on file  Occupational History   Occupation: on disability  Tobacco Use   Smoking status: Every Day    Packs/day: 0.25    Types: Cigarettes   Smokeless tobacco: Never  Vaping Use   Vaping Use: Never used  Substance and Sexual Activity   Alcohol use: Yes    Comment: occas   Drug use: No   Sexual activity: Not Currently    Birth control/protection: Post-menopausal  Other Topics Concern   Not on file  Social History Narrative   Not on file   Social Determinants of Health   Financial Resource Strain: High Risk   Difficulty of Paying Living Expenses: Hard  Food Insecurity: Food Insecurity Present   Worried About Running Out of Food in the Last Year: Sometimes true   Ran Out of Food in the Last Year: Sometimes true  Transportation Needs: Unmet Transportation Needs   Lack of Transportation (Medical): Yes   Lack of Transportation (Non-Medical): Yes  Physical Activity: Insufficiently Active   Days of Exercise per Week: 2 days    Minutes of Exercise per Session: 30 min  Stress: Stress Concern Present   Feeling of Stress : To some extent  Social Connections: Socially Isolated   Frequency of Communication with Friends and Family: Once a week   Frequency of Social Gatherings with Friends and Family: Once a week   Attends Religious Services: Never   Marine scientist or Organizations: No   Attends Archivist Meetings: Never   Marital Status: Widowed  Human resources officer Violence: Not At Risk   Fear of Current or Ex-Partner: No   Emotionally Abused: No   Physically Abused: No   Sexually Abused: No     Current Outpatient Medications:    acetaminophen (TYLENOL) 160 MG/5ML solution, Take 5 mLs (160 mg total) by mouth every 6 (six) hours as needed for mild pain., Disp: 120 mL, Rfl: 6   albuterol (PROVENTIL) (2.5 MG/3ML) 0.083% nebulizer solution, Take 3 mLs (2.5 mg total) by nebulization 3 (three) times daily as needed for wheezing., Disp: 75 mL, Rfl: 12   atorvastatin (LIPITOR) 40 MG tablet, Take 40 mg by mouth daily., Disp: , Rfl:    benzonatate (TESSALON PERLES) 100 MG capsule, Take 2 capsules (200 mg total) by mouth 3 (three)  times daily as needed for cough., Disp: 90 capsule, Rfl: 0   buPROPion (WELLBUTRIN SR) 100 MG 12 hr tablet, Take 1 tablet (100 mg total) by mouth 2 (two) times daily., Disp: 60 tablet, Rfl: 2   cefUROXime (CEFTIN) 500 MG tablet, Take 500 mg by mouth 2 (two) times daily with a meal., Disp: , Rfl:    cholestyramine (QUESTRAN) 4 GM/DOSE powder, TAKE 1 SCOOPFUL(4 GRAMS) MIXED IN 4-8 OUNCES OF LIQUID AND DRINK, DAILY, Disp: 368.76 g, Rfl: 3   cyclobenzaprine (FLEXERIL) 5 MG tablet, Take 5 mg by mouth 3 (three) times daily as needed for muscle spasms., Disp: , Rfl:    cycloSPORINE (RESTASIS) 0.05 % ophthalmic emulsion, Place 1 drop into both eyes every 12 (twelve) hours., Disp: , Rfl:    feeding supplement, ENSURE ENLIVE, (ENSURE ENLIVE) LIQD, Take 237 mLs by mouth 2 (two) times daily  between meals., Disp: 237 mL, Rfl: 12   fluticasone (FLONASE) 50 MCG/ACT nasal spray, USE 1 SPRAY INTO EACH NOSTRIL TWICE DAILY, Disp: 16 g, Rfl: 6   hydrochlorothiazide (MICROZIDE) 12.5 MG capsule, Take 12.5 mg by mouth daily., Disp: , Rfl:    HYDROcodone-acetaminophen (NORCO/VICODIN) 5-325 MG tablet, Take 1 tablet by mouth every 6 (six) hours as needed for moderate pain., Disp: 15 tablet, Rfl: 0   hydrOXYzine (ATARAX/VISTARIL) 25 MG tablet, Take 25 mg by mouth 3 (three) times daily as needed., Disp: , Rfl:    lipase/protease/amylase (CREON) 12000-38000 units CPEP capsule, Take by mouth., Disp: , Rfl:    lisinopril (ZESTRIL) 40 MG tablet, Take 1 tablet (40 mg total) by mouth daily., Disp: 90 tablet, Rfl: 3   methocarbamol (ROBAXIN) 500 MG tablet, Take 1 tablet (500 mg total) by mouth every 8 (eight) hours as needed for muscle spasms., Disp: 12 tablet, Rfl: 0   methylPREDNISolone (MEDROL DOSEPAK) 4 MG TBPK tablet, As directed, Disp: 21 each, Rfl: 0   mirtazapine (REMERON) 15 MG tablet, Take 15 mg by mouth daily., Disp: , Rfl:    omeprazole (PRILOSEC) 20 MG capsule, TAKE 1 CAPSULE BY MOUTH TWICE DAILY, Disp: 180 capsule, Rfl: 0   ondansetron (ZOFRAN ODT) 4 MG disintegrating tablet, Take 1 tablet (4 mg total) by mouth every 8 (eight) hours as needed., Disp: 20 tablet, Rfl: 0   pantoprazole (PROTONIX) 40 MG tablet, TAKE 1 TABLET BY MOUTH ONCE DAILY, Disp: 30 tablet, Rfl: 1   predniSONE (DELTASONE) 10 MG tablet, Take 1 tablet (10 mg total) by mouth daily. Day 1-3: take 4 tablets PO daily Day 4-6: take 3 tablets PO daily Day 7-9: take 2 tablets PO daily Day 10-12: take 1 tablet PO daily, Disp: 30 tablet, Rfl: 0   pregabalin (LYRICA) 100 MG capsule, Take 100 mg by mouth 3 (three) times daily. , Disp: , Rfl:    sertraline (ZOLOFT) 50 MG tablet, TAKE 1 TABLET BY MOUTH TWICE DAILY, Disp: 60 tablet, Rfl: 1   SYMBICORT 160-4.5 MCG/ACT inhaler, INHALE 2 PUFFS TWICE A DAY RINSE MOUTH WITH WATER AFTER EACH USE,  Disp: 10.2 g, Rfl: 3   Vitamin D, Ergocalciferol, (DRISDOL) 1.25 MG (50000 UNIT) CAPS capsule, TAKE 1 CAPSULE BY MOUTH WEEKLY, Disp: 12 capsule, Rfl: 3   Water For Irrigation, Sterile (FREE WATER) SOLN, Place 60 mLs into feeding tube every 8 (eight) hours., Disp: 60 mL, Rfl: 0   Allergies  Allergen Reactions   Aspirin Hives and Itching    ROS Review of Systems  Constitutional:  Positive for fatigue. Negative for chills, diaphoresis and  fever.  HENT:  Negative for congestion.   Eyes:  Negative for pain.  Respiratory:  Negative for choking.   Cardiovascular:  Negative for chest pain, palpitations and leg swelling.  Gastrointestinal:  Negative for abdominal distention and anal bleeding.  Neurological:  Positive for dizziness. Negative for seizures.  Psychiatric/Behavioral:  Negative for behavioral problems.      Objective:    Physical Exam Constitutional:      Appearance: She is ill-appearing.     Comments: emaciated  Cardiovascular:     Rate and Rhythm: Normal rate.  Pulmonary:     Effort: Pulmonary effort is normal.  Abdominal:     General: Abdomen is flat. Bowel sounds are normal.  Skin:    Coloration: Skin is not jaundiced.  Neurological:     General: No focal deficit present.  Psychiatric:        Mood and Affect: Mood normal.    BP (!) 156/72    Pulse 88    Ht 5\' 5"  (1.651 m)    Wt 87 lb 9.6 oz (39.7 kg)    BMI 14.58 kg/m  Wt Readings from Last 3 Encounters:  02/08/21 87 lb 9.6 oz (39.7 kg)  12/02/20 87 lb (39.5 kg)  10/21/20 89 lb (40.4 kg)     Health Maintenance Due  Topic Date Due   Hepatitis C Screening  Never done   Zoster Vaccines- Shingrix (1 of 2) Never done   PAP SMEAR-Modifier  Never done    There are no preventive care reminders to display for this patient.  Lab Results  Component Value Date   TSH 0.40 07/07/2020   Lab Results  Component Value Date   WBC 10.7 (H) 08/04/2020   HGB 12.7 08/04/2020   HCT 38.0 08/04/2020   MCV 103.8 (H)  08/04/2020   PLT 272 08/04/2020   Lab Results  Component Value Date   NA 138 08/04/2020   K 4.5 08/04/2020   CO2 26 08/04/2020   GLUCOSE 103 (H) 08/04/2020   BUN 17 08/04/2020   CREATININE 0.54 08/04/2020   BILITOT 0.3 07/16/2020   ALKPHOS 62 04/09/2020   AST 15 07/16/2020   ALT 14 07/16/2020   PROT 6.4 07/16/2020   ALBUMIN 4.0 04/09/2020   CALCIUM 8.4 (L) 08/04/2020   ANIONGAP 4 (L) 08/04/2020   No results found for: CHOL No results found for: HDL No results found for: [facility] Lab Results  Component Value Date   TRIG 107 05/30/2014   No results found for: CHOLHDL No results found for: HGBA1C    Assessment & Plan:  Ekg  Nsr   lvh   Suggest cadiomyopathy Problem List Items Addressed This Visit       Cardiovascular and Mediastinum   Essential hypertension - Primary    stable        Respiratory   COPD (chronic obstructive pulmonary disease) ([place]) (Chronic)    Stop smoking      Adenocarcinoma of lung ([place])    Ref to [facility]        Other   Tobacco abuse counseling    Advised to quit smoking      Adult failure to thrive    Patient was advised to continue current supplementation, she has a short gut syndrome      Syncope and collapse    -electrocardiogram  cardiac findings suggestive of cardiomyopathy with anteroseptal myocardial infarction which is old      Dizziness    No arrhythmia noticed on the  EKG      Other Visit Diagnoses     Black-out (not amnesia)       Relevant Orders   EKG 12-Lead       No orders of the defined types were placed in this encounter.   Follow-up: No follow-ups on file.    Cletis Athens, MD

## 2021-02-08 NOTE — Assessment & Plan Note (Signed)
stable °

## 2021-02-08 NOTE — Assessment & Plan Note (Addendum)
-  electrocardiogram  cardiac findings suggestive of cardiomyopathy with anteroseptal myocardial infarction which is old

## 2021-02-10 ENCOUNTER — Other Ambulatory Visit: Payer: Self-pay

## 2021-02-10 ENCOUNTER — Telehealth: Payer: Self-pay

## 2021-02-10 DIAGNOSIS — Z1211 Encounter for screening for malignant neoplasm of colon: Secondary | ICD-10-CM

## 2021-02-10 NOTE — Progress Notes (Signed)
Procedure rescheduled for 03/02/21.

## 2021-02-10 NOTE — Telephone Encounter (Signed)
Called to inform patient we have received the medial clearance for procedure. Patient would like to reschedule procedure at this time. Orders will be placed and updated instructions will be mailed out. Clearance will be scanned into chart.

## 2021-02-11 ENCOUNTER — Encounter: Admission: RE | Payer: Self-pay | Source: Ambulatory Visit

## 2021-02-11 ENCOUNTER — Other Ambulatory Visit: Payer: Self-pay | Admitting: Internal Medicine

## 2021-02-11 ENCOUNTER — Ambulatory Visit
Admission: RE | Admit: 2021-02-11 | Payer: Commercial Managed Care - HMO | Source: Ambulatory Visit | Admitting: Gastroenterology

## 2021-02-11 DIAGNOSIS — R5383 Other fatigue: Secondary | ICD-10-CM | POA: Diagnosis not present

## 2021-02-11 DIAGNOSIS — F17210 Nicotine dependence, cigarettes, uncomplicated: Secondary | ICD-10-CM | POA: Diagnosis not present

## 2021-02-11 DIAGNOSIS — M170 Bilateral primary osteoarthritis of knee: Secondary | ICD-10-CM | POA: Diagnosis not present

## 2021-02-11 DIAGNOSIS — R69 Illness, unspecified: Secondary | ICD-10-CM | POA: Diagnosis not present

## 2021-02-11 DIAGNOSIS — M25561 Pain in right knee: Secondary | ICD-10-CM | POA: Diagnosis not present

## 2021-02-11 DIAGNOSIS — C3492 Malignant neoplasm of unspecified part of left bronchus or lung: Secondary | ICD-10-CM | POA: Diagnosis not present

## 2021-02-11 DIAGNOSIS — R531 Weakness: Secondary | ICD-10-CM | POA: Diagnosis not present

## 2021-02-11 DIAGNOSIS — M25562 Pain in left knee: Secondary | ICD-10-CM | POA: Diagnosis not present

## 2021-02-11 DIAGNOSIS — I10 Essential (primary) hypertension: Secondary | ICD-10-CM | POA: Diagnosis not present

## 2021-02-11 DIAGNOSIS — R079 Chest pain, unspecified: Secondary | ICD-10-CM

## 2021-02-11 DIAGNOSIS — C3411 Malignant neoplasm of upper lobe, right bronchus or lung: Secondary | ICD-10-CM | POA: Diagnosis not present

## 2021-02-11 SURGERY — COLONOSCOPY WITH PROPOFOL
Anesthesia: General

## 2021-02-13 DIAGNOSIS — R5383 Other fatigue: Secondary | ICD-10-CM | POA: Diagnosis not present

## 2021-02-13 DIAGNOSIS — F17210 Nicotine dependence, cigarettes, uncomplicated: Secondary | ICD-10-CM | POA: Diagnosis not present

## 2021-03-01 ENCOUNTER — Telehealth: Payer: Self-pay

## 2021-03-01 NOTE — Telephone Encounter (Signed)
Patient called and states she has a cold and needs to reschedule her colonoscopy for 03/02/2021

## 2021-03-01 NOTE — Telephone Encounter (Signed)
Hope has already to spoken to patient and rescheduled procedure to 04/13/21.

## 2021-03-01 NOTE — Telephone Encounter (Signed)
Patient called and wanted to reschedule her procedure till 04/13/2021 called endo and sent new communications and sent new referral to [facility]

## 2021-03-08 ENCOUNTER — Other Ambulatory Visit: Payer: Self-pay

## 2021-03-10 ENCOUNTER — Encounter: Payer: 59 | Admitting: Obstetrics and Gynecology

## 2021-03-10 DIAGNOSIS — Z1231 Encounter for screening mammogram for malignant neoplasm of breast: Secondary | ICD-10-CM

## 2021-03-10 DIAGNOSIS — Z124 Encounter for screening for malignant neoplasm of cervix: Secondary | ICD-10-CM

## 2021-03-10 DIAGNOSIS — Z01419 Encounter for gynecological examination (general) (routine) without abnormal findings: Secondary | ICD-10-CM

## 2021-03-17 ENCOUNTER — Ambulatory Visit: Payer: Medicare Other | Admitting: Internal Medicine

## 2021-03-19 DIAGNOSIS — H2513 Age-related nuclear cataract, bilateral: Secondary | ICD-10-CM | POA: Diagnosis not present

## 2021-03-23 DIAGNOSIS — H5213 Myopia, bilateral: Secondary | ICD-10-CM | POA: Diagnosis not present

## 2021-04-06 ENCOUNTER — Other Ambulatory Visit: Payer: Self-pay

## 2021-04-06 ENCOUNTER — Encounter: Payer: Self-pay | Admitting: Internal Medicine

## 2021-04-06 ENCOUNTER — Ambulatory Visit (INDEPENDENT_AMBULATORY_CARE_PROVIDER_SITE_OTHER): Payer: Medicare Other | Admitting: Internal Medicine

## 2021-04-06 ENCOUNTER — Other Ambulatory Visit: Payer: Self-pay | Admitting: Internal Medicine

## 2021-04-06 VITALS — BP 140/55 | HR 65 | Ht 65.0 in | Wt 91.0 lb

## 2021-04-06 DIAGNOSIS — I10 Essential (primary) hypertension: Secondary | ICD-10-CM | POA: Diagnosis not present

## 2021-04-06 DIAGNOSIS — R627 Adult failure to thrive: Secondary | ICD-10-CM | POA: Diagnosis not present

## 2021-04-06 DIAGNOSIS — J410 Simple chronic bronchitis: Secondary | ICD-10-CM | POA: Diagnosis not present

## 2021-04-06 DIAGNOSIS — Z716 Tobacco abuse counseling: Secondary | ICD-10-CM | POA: Diagnosis not present

## 2021-04-06 DIAGNOSIS — I429 Cardiomyopathy, unspecified: Secondary | ICD-10-CM | POA: Diagnosis not present

## 2021-04-06 NOTE — Assessment & Plan Note (Signed)
Stable

## 2021-04-06 NOTE — Progress Notes (Signed)
Established Patient Office Visit  Subjective:  Patient ID: [name] [name], female    DOB: Jan 25, 1955  Age: 66 y.o. MRN: [id number]  CC:  Chief Complaint  Patient presents with   Back Pain    Patient is here for back pain x 1 month. Patient states the pain is located in the lower back and feels like a stabbing pain.    Back Pain This is a new problem. The current episode started more than 1 month ago. The problem has been gradually worsening since onset. The pain is present in the sacro-iliac. The quality of the pain is described as shooting. The pain is at a severity of 7/10.   Domingo Sep presents for c/o back pain and swelling of axilla lt side  Past Medical History:  Diagnosis Date   Bowel obstruction ([facility])    x1 month ago   Cancer [facility])    stomach cancer per pt and currently lung ca   COPD (chronic obstructive pulmonary disease) ([facility])    Essential hypertension 12/02/2019   Lung cancer ([facility])    Personal history of radiation therapy 2019-2020   lung ca    Past Surgical History:  Procedure Laterality Date   COLON SURGERY      Family History  Problem Relation Age of Onset   Lupus Sister    Heart failure Neg Hx    Diabetes Neg Hx     Social History   Socioeconomic History   Marital status: Widowed    Spouse name: Not on file   Number of children: 2   Years of education: 10   Highest education level: Not on file  Occupational History   Occupation: on disability  Tobacco Use   Smoking status: Every Day    Packs/day: 0.25    Types: Cigarettes   Smokeless tobacco: Never  Vaping Use   Vaping Use: Never used  Substance and Sexual Activity   Alcohol use: Yes    Comment: occas   Drug use: No   Sexual activity: Not Currently    Birth control/protection: Post-menopausal  Other Topics Concern   Not on file  Social History Narrative   Not on file   Social Determinants of Health   Financial Resource Strain: High Risk   Difficulty of Paying Living  Expenses: Hard  Food Insecurity: Food Insecurity Present   Worried About Running Out of Food in the Last Year: Sometimes true   Ran Out of Food in the Last Year: Sometimes true  Transportation Needs: Unmet Transportation Needs   Lack of Transportation (Medical): Yes   Lack of Transportation (Non-Medical): Yes  Physical Activity: Insufficiently Active   Days of Exercise per Week: 2 days   Minutes of Exercise per Session: 30 min  Stress: Stress Concern Present   Feeling of Stress : To some extent  Social Connections: Socially Isolated   Frequency of Communication with Friends and Family: Once a week   Frequency of Social Gatherings with Friends and Family: Once a week   Attends Religious Services: Never   Database administrator or Organizations: No   Attends Banker Meetings: Never   Marital Status: Widowed  Catering manager Violence: Not At Risk   Fear of Current or Ex-Partner: No   Emotionally Abused: No   Physically Abused: No   Sexually Abused: No     Current Outpatient Medications:    acetaminophen (TYLENOL) 160 MG/5ML solution, Take 5 mLs (160 mg total) by mouth every 6 (  six) hours as needed for mild pain., Disp: 120 mL, Rfl: 6   albuterol (PROVENTIL) (2.5 MG/3ML) 0.083% nebulizer solution, Take 3 mLs (2.5 mg total) by nebulization 3 (three) times daily as needed for wheezing., Disp: 75 mL, Rfl: 12   atorvastatin (LIPITOR) 40 MG tablet, Take 40 mg by mouth daily., Disp: , Rfl:    buPROPion (WELLBUTRIN SR) 100 MG 12 hr tablet, Take 1 tablet (100 mg total) by mouth 2 (two) times daily., Disp: 60 tablet, Rfl: 2   cefUROXime (CEFTIN) 500 MG tablet, Take 500 mg by mouth 2 (two) times daily with a meal., Disp: , Rfl:    cholestyramine (QUESTRAN) 4 GM/DOSE powder, TAKE 1 SCOOPFUL(4 GRAMS) MIXED IN 4-8 OUNCES OF LIQUID AND DRINK, DAILY, Disp: 368.76 g, Rfl: 3   cyclobenzaprine (FLEXERIL) 5 MG tablet, Take 5 mg by mouth 3 (three) times daily as needed for muscle spasms.,  Disp: , Rfl:    cycloSPORINE (RESTASIS) 0.05 % ophthalmic emulsion, Place 1 drop into both eyes every 12 (twelve) hours., Disp: , Rfl:    feeding supplement, ENSURE ENLIVE, (ENSURE ENLIVE) LIQD, Take 237 mLs by mouth 2 (two) times daily between meals., Disp: 237 mL, Rfl: 12   fluticasone (FLONASE) 50 MCG/ACT nasal spray, USE 1 SPRAY INTO EACH NOSTRIL TWICE DAILY, Disp: 16 g, Rfl: 6   hydrochlorothiazide (MICROZIDE) 12.5 MG capsule, Take 12.5 mg by mouth daily., Disp: , Rfl:    HYDROcodone-acetaminophen (NORCO/VICODIN) 5-325 MG tablet, Take 1 tablet by mouth every 6 (six) hours as needed for moderate pain., Disp: 15 tablet, Rfl: 0   hydrOXYzine (ATARAX/VISTARIL) 25 MG tablet, Take 25 mg by mouth 3 (three) times daily as needed., Disp: , Rfl:    lipase/protease/amylase (CREON) 12000-38000 units CPEP capsule, Take by mouth., Disp: , Rfl:    lisinopril (ZESTRIL) 40 MG tablet, Take 1 tablet (40 mg total) by mouth daily., Disp: 90 tablet, Rfl: 3   methocarbamol (ROBAXIN) 500 MG tablet, Take 1 tablet (500 mg total) by mouth every 8 (eight) hours as needed for muscle spasms., Disp: 12 tablet, Rfl: 0   mirtazapine (REMERON) 15 MG tablet, Take 15 mg by mouth daily., Disp: , Rfl:    omeprazole (PRILOSEC) 20 MG capsule, TAKE 1 CAPSULE BY MOUTH TWICE DAILY, Disp: 180 capsule, Rfl: 0   ondansetron (ZOFRAN ODT) 4 MG disintegrating tablet, Take 1 tablet (4 mg total) by mouth every 8 (eight) hours as needed., Disp: 20 tablet, Rfl: 0   pantoprazole (PROTONIX) 40 MG tablet, TAKE 1 TABLET BY MOUTH ONCE DAILY, Disp: 30 tablet, Rfl: 1   predniSONE (DELTASONE) 10 MG tablet, Take 1 tablet (10 mg total) by mouth daily. Day 1-3: take 4 tablets PO daily Day 4-6: take 3 tablets PO daily Day 7-9: take 2 tablets PO daily Day 10-12: take 1 tablet PO daily, Disp: 30 tablet, Rfl: 0   pregabalin (LYRICA) 100 MG capsule, Take 100 mg by mouth 3 (three) times daily. , Disp: , Rfl:    sertraline (ZOLOFT) 50 MG tablet, TAKE 1 TABLET BY  MOUTH TWICE DAILY, Disp: 60 tablet, Rfl: 1   SYMBICORT 160-4.5 MCG/ACT inhaler, INHALE 2 PUFFS TWICE A DAY RINSE MOUTH WITH WATER AFTER EACH USE, Disp: 10.2 g, Rfl: 3   Vitamin D, Ergocalciferol, (DRISDOL) 1.25 MG (50000 UNIT) CAPS capsule, TAKE 1 CAPSULE BY MOUTH WEEKLY, Disp: 12 capsule, Rfl: 3   Water For Irrigation, Sterile (FREE WATER) SOLN, Place 60 mLs into feeding tube every 8 (eight) hours., Disp: 60 mL,  Rfl: 0   Allergies  Allergen Reactions   Aspirin Hives and Itching    ROS Review of Systems  Constitutional: Negative.   HENT: Negative.    Eyes: Negative.   Respiratory: Negative.    Cardiovascular: Negative.   Gastrointestinal: Negative.   Endocrine: Negative.   Genitourinary: Negative.   Musculoskeletal:  Positive for back pain. Negative for neck pain.  Skin: Negative.  Negative for color change.  Allergic/Immunologic: Negative.   Neurological: Negative.   Hematological:  Positive for adenopathy.  Psychiatric/Behavioral: Negative.  Negative for behavioral problems.   All other systems reviewed and are negative.    Objective:    Physical Exam Vitals reviewed.  Constitutional:      Appearance: Normal appearance.  HENT:     Mouth/Throat:     Mouth: Mucous membranes are moist.  Eyes:     Pupils: Pupils are equal, round, and reactive to light.  Neck:     Vascular: No carotid bruit.  Cardiovascular:     Rate and Rhythm: Normal rate and regular rhythm.     Pulses: Normal pulses.     Heart sounds: Normal heart sounds.  Pulmonary:     Effort: Pulmonary effort is normal.     Breath sounds: Normal breath sounds.  Abdominal:     General: Bowel sounds are normal.     Palpations: Abdomen is soft. There is no hepatomegaly, splenomegaly or mass.     Tenderness: There is no abdominal tenderness.     Hernia: No hernia is present.  Musculoskeletal:        General: No tenderness.     Cervical back: Neck supple.     Right lower leg: No edema.     Left lower leg: No  edema.  Skin:    Findings: No rash.  Neurological:     Mental Status: She is alert and oriented to person, place, and time.     Motor: No weakness.  Psychiatric:        Mood and Affect: Mood and affect normal.        Behavior: Behavior normal.    BP (!) 140/55   Pulse 65   Ht 5\' 5"  (1.651 m)   Wt 91 lb (41.3 kg)   BMI 15.14 kg/m  Wt Readings from Last 3 Encounters:  04/06/21 91 lb (41.3 kg)  02/08/21 87 lb 9.6 oz (39.7 kg)  12/02/20 87 lb (39.5 kg)     Health Maintenance Due  Topic Date Due   Hepatitis C Screening  Never done   Zoster Vaccines- Shingrix (1 of 2) Never done   PAP SMEAR-Modifier  Never done    There are no preventive care reminders to display for this patient.  Lab Results  Component Value Date   TSH 0.40 07/07/2020   Lab Results  Component Value Date   WBC 10.7 (H) 08/04/2020   HGB 12.7 08/04/2020   HCT 38.0 08/04/2020   MCV 103.8 (H) 08/04/2020   PLT 272 08/04/2020   Lab Results  Component Value Date   NA 138 08/04/2020   K 4.5 08/04/2020   CO2 26 08/04/2020   GLUCOSE 103 (H) 08/04/2020   BUN 17 08/04/2020   CREATININE 0.54 08/04/2020   BILITOT 0.3 07/16/2020   ALKPHOS 62 04/09/2020   AST 15 07/16/2020   ALT 14 07/16/2020   PROT 6.4 07/16/2020   ALBUMIN 4.0 04/09/2020   CALCIUM 8.4 (L) 08/04/2020   ANIONGAP 4 (L) 08/04/2020   No results found for:  CHOL No results found for: HDL No results found for: [facility] Lab Results  Component Value Date   TRIG 107 05/30/2014   No results found for: CHOLHDL No results found for: GNFA2Z    Assessment & Plan:   Problem List Items Addressed This Visit       Cardiovascular and Mediastinum   Cardiomyopathy (HCC)    Stable      Essential hypertension - Primary    Stable        Respiratory   COPD (chronic obstructive pulmonary disease) (HCC) (Chronic)    Stop smoking        Other   Tobacco abuse counseling    - I instructed the patient to stop smoking and provided them with  smoking cessation materials.  - I informed the patient that smoking puts them at increased risk for cancer, COPD, hypertension, and more.  - Informed the patient to seek help if they begin to have trouble breathing, develop chest pain, start to cough up blood, feel faint, or pass out.      Adult failure to thrive    Drink Ensure 2 cans a day every day       No orders of the defined types were placed in this encounter.   Follow-up: No follow-ups on file.    Corky Downs, MD

## 2021-04-06 NOTE — Assessment & Plan Note (Signed)
-   I instructed the patient to stop smoking and provided them with smoking cessation materials.  - I informed the patient that smoking puts them at increased risk for cancer, COPD, hypertension, and more.  - Informed the patient to seek help if they begin to have trouble breathing, develop chest pain, start to cough up blood, feel faint, or pass out.  

## 2021-04-06 NOTE — Assessment & Plan Note (Signed)
Stop smoking

## 2021-04-06 NOTE — Assessment & Plan Note (Signed)
Drink Ensure 2 cans a day every day ?

## 2021-04-13 ENCOUNTER — Encounter: Admission: RE | Payer: Self-pay | Source: Ambulatory Visit

## 2021-04-13 ENCOUNTER — Ambulatory Visit: Admission: RE | Admit: 2021-04-13 | Payer: Medicare Other | Source: Ambulatory Visit | Admitting: Gastroenterology

## 2021-04-13 ENCOUNTER — Encounter: Payer: Self-pay | Admitting: Anesthesiology

## 2021-04-13 SURGERY — COLONOSCOPY WITH PROPOFOL
Anesthesia: General

## 2021-04-13 NOTE — Anesthesia Preprocedure Evaluation (Deleted)
Anesthesia Evaluation  ?Patient identified by MRN, date of birth, ID band ?Patient awake ? ? ? ?Reviewed: ?Allergy & Precautions, NPO status , Patient's Chart, lab work & pertinent test results ? ?Airway ?Mallampati: III ? ?TM Distance: >3 FB ?Neck ROM: full ? ? ? Dental ? ?(+) Chipped ?  ?Pulmonary ?COPD, Current Smoker,  ?  ?Pulmonary exam normal ? ? ? ? ? ? ? Cardiovascular ?hypertension, Normal cardiovascular exam ? ? ?  ?Neuro/Psych ?negative neurological ROS ? negative psych ROS  ? GI/Hepatic ?negative GI ROS, Neg liver ROS,   ?Endo/Other  ?negative endocrine ROS ? Renal/GU ?negative Renal ROS  ?negative genitourinary ?  ?Musculoskeletal ? ? Abdominal ?  ?Peds ? Hematology ?negative hematology ROS ?(+)   ?Anesthesia Other Findings ?Past Medical History: ?No date: Bowel obstruction ([place]) ?    Comment:  x1 month ago ?No date: Cancer [facility]) ?    Comment:  stomach cancer per pt and currently lung ca ?No date: COPD (chronic obstructive pulmonary disease) ([place]) ?12/02/2019: Essential hypertension ?No date: Lung cancer [facility]) ?2019-2020: Personal history of radiation therapy ?    Comment:  lung ca ? ?Past Surgical History: ?No date: COLON SURGERY ? ? ? ? Reproductive/Obstetrics ?negative OB ROS ? ?  ? ? ? ? ? ? ? ? ? ? ? ? ? ?  ?  ? ? ? ? ? ? ? ? ?Anesthesia Physical ?Anesthesia Plan ? ?ASA:  ? ?Anesthesia Plan: General  ? ?Post-op Pain Management:   ? ?Induction:  ? ?PONV Risk Score and Plan: Propofol infusion and TIVA ? ?Airway Management Planned:  ? ?Additional Equipment:  ? ?Intra-op Plan:  ? ?Post-operative Plan:  ? ?Informed Consent:  ? ?Plan Discussed with: Anesthesiologist, CRNA and Surgeon ? ?Anesthesia Plan Comments:   ? ? ? ? ? ? ?Anesthesia Quick Evaluation ? ?

## 2021-04-23 DIAGNOSIS — H5213 Myopia, bilateral: Secondary | ICD-10-CM | POA: Diagnosis not present

## 2021-05-18 ENCOUNTER — Other Ambulatory Visit: Payer: Self-pay | Admitting: Internal Medicine

## 2021-05-18 DIAGNOSIS — R079 Chest pain, unspecified: Secondary | ICD-10-CM

## 2021-05-18 DIAGNOSIS — R911 Solitary pulmonary nodule: Secondary | ICD-10-CM | POA: Diagnosis not present

## 2021-05-18 DIAGNOSIS — C3492 Malignant neoplasm of unspecified part of left bronchus or lung: Secondary | ICD-10-CM | POA: Diagnosis not present

## 2021-06-07 ENCOUNTER — Ambulatory Visit: Payer: Medicare Other | Admitting: Internal Medicine

## 2021-06-08 ENCOUNTER — Other Ambulatory Visit: Payer: Self-pay

## 2021-06-11 ENCOUNTER — Ambulatory Visit: Payer: Medicare Other | Admitting: Nurse Practitioner

## 2021-06-11 ENCOUNTER — Other Ambulatory Visit: Payer: Self-pay

## 2021-06-11 ENCOUNTER — Inpatient Hospital Stay
Admission: EM | Admit: 2021-06-11 | Discharge: 2021-06-13 | DRG: 388 | Disposition: A | Discharge status: Home / Self Care | Payer: Medicare Other | Attending: Internal Medicine | Admitting: Internal Medicine

## 2021-06-11 ENCOUNTER — Emergency Department: Payer: Medicare Other

## 2021-06-11 DIAGNOSIS — I10 Essential (primary) hypertension: Secondary | ICD-10-CM | POA: Diagnosis not present

## 2021-06-11 DIAGNOSIS — R9431 Abnormal electrocardiogram [ECG] [EKG]: Secondary | ICD-10-CM | POA: Diagnosis not present

## 2021-06-11 DIAGNOSIS — J449 Chronic obstructive pulmonary disease, unspecified: Secondary | ICD-10-CM | POA: Diagnosis present

## 2021-06-11 DIAGNOSIS — R1012 Left upper quadrant pain: Secondary | ICD-10-CM | POA: Diagnosis not present

## 2021-06-11 DIAGNOSIS — E785 Hyperlipidemia, unspecified: Secondary | ICD-10-CM | POA: Diagnosis present

## 2021-06-11 DIAGNOSIS — Z79899 Other long term (current) drug therapy: Secondary | ICD-10-CM | POA: Diagnosis not present

## 2021-06-11 DIAGNOSIS — E43 Unspecified severe protein-calorie malnutrition: Secondary | ICD-10-CM | POA: Diagnosis present

## 2021-06-11 DIAGNOSIS — Z923 Personal history of irradiation: Secondary | ICD-10-CM

## 2021-06-11 DIAGNOSIS — C3490 Malignant neoplasm of unspecified part of unspecified bronchus or lung: Secondary | ICD-10-CM | POA: Diagnosis present

## 2021-06-11 DIAGNOSIS — Z681 Body mass index (BMI) 19 or less, adult: Secondary | ICD-10-CM | POA: Diagnosis not present

## 2021-06-11 DIAGNOSIS — I959 Hypotension, unspecified: Secondary | ICD-10-CM | POA: Diagnosis present

## 2021-06-11 DIAGNOSIS — R109 Unspecified abdominal pain: Secondary | ICD-10-CM | POA: Diagnosis present

## 2021-06-11 DIAGNOSIS — K56600 Partial intestinal obstruction, unspecified as to cause: Principal | ICD-10-CM | POA: Diagnosis present

## 2021-06-11 DIAGNOSIS — R935 Abnormal findings on diagnostic imaging of other abdominal regions, including retroperitoneum: Secondary | ICD-10-CM | POA: Diagnosis present

## 2021-06-11 DIAGNOSIS — Z9221 Personal history of antineoplastic chemotherapy: Secondary | ICD-10-CM | POA: Diagnosis not present

## 2021-06-11 DIAGNOSIS — Z832 Family history of diseases of the blood and blood-forming organs and certain disorders involving the immune mechanism: Secondary | ICD-10-CM

## 2021-06-11 DIAGNOSIS — Z85028 Personal history of other malignant neoplasm of stomach: Secondary | ICD-10-CM | POA: Diagnosis not present

## 2021-06-11 DIAGNOSIS — Z7951 Long term (current) use of inhaled steroids: Secondary | ICD-10-CM | POA: Diagnosis not present

## 2021-06-11 DIAGNOSIS — F17210 Nicotine dependence, cigarettes, uncomplicated: Secondary | ICD-10-CM | POA: Diagnosis present

## 2021-06-11 DIAGNOSIS — Z85118 Personal history of other malignant neoplasm of bronchus and lung: Secondary | ICD-10-CM | POA: Diagnosis not present

## 2021-06-11 DIAGNOSIS — R6889 Other general symptoms and signs: Secondary | ICD-10-CM | POA: Diagnosis not present

## 2021-06-11 DIAGNOSIS — Z743 Need for continuous supervision: Secondary | ICD-10-CM | POA: Diagnosis not present

## 2021-06-11 DIAGNOSIS — Z7952 Long term (current) use of systemic steroids: Secondary | ICD-10-CM | POA: Diagnosis not present

## 2021-06-11 LAB — CBC
HCT: 37.7 % (ref 36.0–46.0)
HCT: 38.9 % (ref 36.0–46.0)
Hemoglobin: 12.3 g/dL (ref 12.0–15.0)
Hemoglobin: 12.6 g/dL (ref 12.0–15.0)
MCH: 33 pg (ref 26.0–34.0)
MCH: 32.9 pg (ref 26.0–34.0)
MCHC: 32.6 g/dL (ref 30.0–36.0)
MCHC: 32.4 g/dL (ref 30.0–36.0)
MCV: 101.1 fL — ABNORMAL HIGH (ref 80.0–100.0)
MCV: 101.6 fL — ABNORMAL HIGH (ref 80.0–100.0)
Platelets: 318 10*3/uL (ref 150–400)
Platelets: 314 10*3/uL (ref 150–400)
RBC: 3.73 MIL/uL — ABNORMAL LOW (ref 3.87–5.11)
RBC: 3.83 MIL/uL — ABNORMAL LOW (ref 3.87–5.11)
RDW: 12.2 % (ref 11.5–15.5)
RDW: 12.2 % (ref 11.5–15.5)
WBC: 7.8 10*3/uL (ref 4.0–10.5)
WBC: 8.1 10*3/uL (ref 4.0–10.5)
nRBC: 0 % (ref 0.0–0.2)
nRBC: 0 % (ref 0.0–0.2)

## 2021-06-11 LAB — HIV ANTIBODY (ROUTINE TESTING W REFLEX): HIV Screen 4th Generation wRfx: NONREACTIVE

## 2021-06-11 LAB — URINALYSIS, ROUTINE W REFLEX MICROSCOPIC
Bacteria, UA: NONE SEEN
Bilirubin Urine: NEGATIVE
Glucose, UA: NEGATIVE mg/dL
Hgb urine dipstick: NEGATIVE
Ketones, ur: NEGATIVE mg/dL
Nitrite: NEGATIVE
Protein, ur: NEGATIVE mg/dL
Specific Gravity, Urine: 1.013 (ref 1.005–1.030)
pH: 5 (ref 5.0–8.0)

## 2021-06-11 LAB — COMPREHENSIVE METABOLIC PANEL
ALT: 14 U/L (ref 0–44)
AST: 14 U/L — ABNORMAL LOW (ref 15–41)
Albumin: 3.4 g/dL — ABNORMAL LOW (ref 3.5–5.0)
Alkaline Phosphatase: 60 U/L (ref 38–126)
Anion gap: 7 (ref 5–15)
BUN: 15 mg/dL (ref 8–23)
CO2: 22 mmol/L (ref 22–32)
Calcium: 8.5 mg/dL — ABNORMAL LOW (ref 8.9–10.3)
Chloride: 106 mmol/L (ref 98–111)
Creatinine, Ser: 0.75 mg/dL (ref 0.44–1.00)
GFR, Estimated: >60 mL/min (ref >60)
Glucose, Bld: 103 mg/dL — ABNORMAL HIGH (ref 70–99)
Potassium: 3.8 mmol/L (ref 3.5–5.1)
Sodium: 135 mmol/L (ref 135–145)
Total Bilirubin: 0.5 mg/dL (ref 0.3–1.2)
Total Protein: 6.7 g/dL (ref 6.5–8.1)

## 2021-06-11 LAB — CREATININE, SERUM
Creatinine, Ser: 0.68 mg/dL (ref 0.44–1.00)
GFR, Estimated: >60 mL/min (ref >60)

## 2021-06-11 LAB — LACTIC ACID, PLASMA: Lactic Acid, Venous: 0.9 mmol/L (ref 0.5–1.9)

## 2021-06-11 LAB — LIPASE, BLOOD: Lipase: 30 U/L (ref 11–51)

## 2021-06-11 MED ORDER — HYDROCODONE-ACETAMINOPHEN 5-325 MG PO TABS: 1.0000 | ORAL_TABLET | ORAL | Status: DC | PRN

## 2021-06-11 MED ORDER — ACETAMINOPHEN 650 MG RE SUPP: 650.0000 mg | Freq: Four times a day (QID) | RECTAL | Status: DC | PRN

## 2021-06-11 MED ORDER — HYDRALAZINE HCL 20 MG/ML IJ SOLN: 10.0000 mg | Freq: Three times a day (TID) | INTRAMUSCULAR | Status: DC | PRN

## 2021-06-11 MED ORDER — DOCUSATE SODIUM 100 MG PO CAPS
100.0000 mg | ORAL_CAPSULE | Freq: Two times a day (BID) | ORAL | Status: DC
  Administered 2021-06-12 – 2021-06-13 (×2): 100 mg via ORAL
  Filled 2021-06-11 (×3): qty 1

## 2021-06-11 MED ORDER — ONDANSETRON HCL 4 MG/2ML IJ SOLN
4.0000 mg | Freq: Four times a day (QID) | INTRAMUSCULAR | Status: DC | PRN
  Administered 2021-06-11 – 2021-06-12 (×2): 4 mg via INTRAVENOUS
  Filled 2021-06-11 (×2): qty 2

## 2021-06-11 MED ORDER — ONDANSETRON HCL 4 MG PO TABS: 4.0000 mg | ORAL_TABLET | Freq: Four times a day (QID) | ORAL | Status: DC | PRN

## 2021-06-11 MED ORDER — ONDANSETRON HCL 4 MG/2ML IJ SOLN
4.0000 mg | Freq: Once | INTRAMUSCULAR | Status: AC
  Administered 2021-06-11: 4 mg via INTRAVENOUS
  Filled 2021-06-11: qty 2

## 2021-06-11 MED ORDER — ACETAMINOPHEN 325 MG PO TABS: 650.0000 mg | ORAL_TABLET | Freq: Four times a day (QID) | ORAL | Status: DC | PRN

## 2021-06-11 MED ORDER — ENOXAPARIN SODIUM 30 MG/0.3ML IJ SOSY
30.0000 mg | PREFILLED_SYRINGE | INTRAMUSCULAR | Status: DC
  Administered 2021-06-11 – 2021-06-12 (×2): 30 mg via SUBCUTANEOUS
  Filled 2021-06-11 (×2): qty 0.3

## 2021-06-11 MED ORDER — IOHEXOL 300 MG/ML  SOLN
100.0000 mL | Freq: Once | INTRAMUSCULAR | Status: AC | PRN
  Administered 2021-06-11: 100 mL via INTRAVENOUS

## 2021-06-11 MED ORDER — SODIUM CHLORIDE 0.9 % IV SOLN: INTRAVENOUS | Status: DC

## 2021-06-11 MED ORDER — MORPHINE SULFATE (PF) 2 MG/ML IV SOLN
1.0000 mg | INTRAVENOUS | Status: DC | PRN
  Administered 2021-06-11: 1 mg via INTRAVENOUS
  Filled 2021-06-11: qty 1

## 2021-06-11 MED ORDER — LACTATED RINGERS IV BOLUS
1000.0000 mL | Freq: Once | INTRAVENOUS | Status: AC
  Administered 2021-06-11: 1000 mL via INTRAVENOUS

## 2021-06-11 NOTE — ED Notes (Signed)
Stretcher locked low; rail up; call bell within reach; pt watching tv.

## 2021-06-11 NOTE — ED Notes (Signed)
See triage note. Pt reports fever x2 days but hasn't had today per pt; L sided flank pain to medial abdomen; aching per pt. Pt's resp reg/unlabored; skin dry; calmly laying on stretcher.

## 2021-06-11 NOTE — Assessment & Plan Note (Addendum)
Patient reports stable COPD,  not using oxygen at home. Continue home inhalers(albuterol and Symbicort)

## 2021-06-11 NOTE — Hospital Course (Addendum)
This 66 years old female with PMH significant for essential hypertension, hyperlipidemia, major depression,  lung cancer, completed chemotherapy recently at [facility], COPD not on home oxygen presented in the ED with left flank pain for 3 days.  Patient denies any triggers for having left flank pain. She describes pain as sharp 5/10 on pain scale radiating to her whole abdomen associated with nausea, she denies throwing up. She also reports having fever 104 at home 3 days ago, denies any fever afterwards.  Patient denies any sick contacts, recent travel, constipation.   ED course: She is hemodynamically stable except hypotension. Temp 98.6, HR 61, RR 17, BP 105/45/SPO2 97% on room air.   Labs include 135, potassium 3.8, chloride 106, bicarb 22, glucose 103, BUN 15, creatinine 0.75, calcium 8.5, anion gap 7, alkaline phosphatase 60, albumin 3.4, lipase 30, AST 14, ALT 14, total protein 6.7, WBC 7.8, hemoglobin 12.3, hematocrit 37.7, MCV 101, platelet 318, UA: Shows moderate leukocytes, no bacteria, no nitrites. CT A/P: Chronic generalized duodenal dilatation consistent with partial small bowel obstruction.  Chronic changes consistent with chronic mesenteric ischemic, if patient has any acute change in the status recommend vascular surgery consult.  5/20: Patient seems improving.  Abdominal pain resolved.  No nausea or vomiting.  Continued to have loose bowel movements but they also seems improving.  Had 1 since this morning. She was feeling hungry and asking for diet.  She was started on clear liquid diet with instructions to advance as tolerated.  5/21: Patient remained stable.  Abdominal pain resolved.  No more nausea or vomiting.  Able to tolerate diet.  Diarrhea resolved.  She wants to go home.  Patient is being discharged with some Zofran to be used only as needed. She was advised to keep herself well-hydrated and supplement her diet with Ensure and boost to meet her nutritional requirement as  she appears malnourished.  She will continue current medication and will follow-up with her providers.

## 2021-06-11 NOTE — ED Triage Notes (Addendum)
First nurse note: Arrived by EMS from home for abd pain X3 days. Hx stomach and lung cancer. V/s stable per EMS. No fevers

## 2021-06-11 NOTE — Progress Notes (Addendum)
Home medications counted in front of the patient then sent to pharmacy for storage. Pt aware that she will get it back upon discharge.

## 2021-06-11 NOTE — ED Triage Notes (Signed)
Pt to ED EMS from home, complains of L abdominal pain that wraps from L back around to L mid abdomen "it just hurts" unable to describe pain. Ongoing for 3 days. Denies urinary symptoms, endorses diarrhea for 3 days 3-4 times per day. States has 104 fever 2 days ago. Pt states is in remission from lung cancer. Pt states "today is the last day" for smoking and she is going to quit.

## 2021-06-11 NOTE — ED Provider Notes (Signed)
[facility] Emergency Department Provider Note ____________________________________________   Event Date/Time   First MD Initiated Contact with Patient 06/11/21 1229     (approximate)  I have reviewed the triage vital signs and the nursing notes.   HISTORY  Chief Complaint Abdominal Pain and Diarrhea  HPI [name] [name] is a 67 y.o. female with history as listed below presents to the emergency department for treatment and evaluation of abdominal pain with nausea and diarrhea. Symptoms started 3 days ago. She reports a fever of 104 that day. No fever since. She had 3-4 episodes of diarrhea yesterday. Food and fluids cause nausea. She last ate this morning, but became nauseated.  Past Medical History:  Diagnosis Date   Bowel obstruction ([place])    x1 month ago   Cancer ([place])    stomach cancer per pt and currently lung ca   COPD (chronic obstructive pulmonary disease) ([place])    Essential hypertension 12/02/2019   Lung cancer ([place])    Personal history of radiation therapy 2019-2020   lung ca    Allergies Aspirin ____________________________________________   PHYSICAL EXAM:  VITAL SIGNS: ED Triage Vitals  Enc Vitals Group     BP 06/11/21 1114 (!) 105/45     Pulse Rate 06/11/21 1114 75     Resp 06/11/21 1114 16     Temp 06/11/21 1114 98.6 F (37 C)     Temp Source 06/11/21 1114 Oral     SpO2 06/11/21 1114 97 %     Weight 06/11/21 1110 98 lb (44.5 kg)     Height 06/11/21 1110 5\' 6"  (1.676 m)     Head Circumference --      Peak Flow --      Pain Score 06/11/21 1109 9     Pain Loc --      Pain Edu? --      Excl. in [place]? --     Constitutional: Alert and oriented. Well appearing and in no acute distress. Eyes: Conjunctivae are normal.  Head: Atraumatic. Nose: No congestion/rhinnorhea. Mouth/Throat: Mucous membranes are moist.  Oropharynx non-erythematous. Neck: No stridor.   Hematological/Lymphatic/Immunilogical: No cervical  lymphadenopathy. Cardiovascular: Normal rate, regular rhythm. Grossly normal heart sounds.  Good peripheral circulation. Respiratory: Normal respiratory effort.  No retractions. Lungs CTAB. Gastrointestinal: Soft and nontender. No distention. No abdominal bruits. No CVA tenderness. Genitourinary:  Musculoskeletal: No lower extremity tenderness nor edema.  No joint effusions. Neurologic:  Normal speech and language. No gross focal neurologic deficits are appreciated. No gait instability. Skin:  Skin is warm, dry and intact. No rash noted. Psychiatric: Mood and affect are normal. Speech and behavior are normal.  ____________________________________________   LABS (all labs ordered are listed, but only abnormal results are displayed)  Labs Reviewed  COMPREHENSIVE METABOLIC PANEL - Abnormal; Notable for the following components:      Result Value   Glucose, Bld 103 (*)    Calcium 8.5 (*)    Albumin 3.4 (*)    AST 14 (*)    All other components within normal limits  CBC - Abnormal; Notable for the following components:   RBC 3.73 (*)    MCV 101.1 (*)    All other components within normal limits  URINALYSIS, ROUTINE W REFLEX MICROSCOPIC - Abnormal; Notable for the following components:   Color, Urine YELLOW (*)    APPearance HAZY (*)    Leukocytes,Ua MODERATE (*)    All other components within normal limits  LIPASE, BLOOD  HIV ANTIBODY (ROUTINE TESTING W REFLEX)  CBC  CREATININE, SERUM  LACTIC ACID, PLASMA  COMPREHENSIVE METABOLIC PANEL  CBC  MAGNESIUM  PHOSPHORUS   ____________________________________________  EKG  NSR rate of 81; no ectopy ____________________________________________  RADIOLOGY  ED MD interpretation:    CT abdomen pelvis with contrast shows chronic dilatation in the area of the duodenum without wall thickening however the amount of dilatation has increased compared to previous studies and there is now an air-fluid level in this region which could  potentially be an ileus or partial obstruction. 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 Stable appearing mass of the left adnexa unchanged since March 2022. Renal cortical lesions are noted in the upper right kidney stable since June 2021.  I, Sherrie George, personally viewed and evaluated these images (plain radiographs) as part of my medical decision making, as well as reviewing the written report by the radiologist. ____________________________________________   PROCEDURES  Procedure(s) performed (including Critical Care):  Procedures  ____________________________________________   INITIAL IMPRESSION / ASSESSMENT AND PLAN     66 year old female presenting to the emergency department for treatment and evaluation of abdominal pain with nausea and diarrhea x 3 days. See HPI for further details. Plan is to get CT abdomen and pelvis and give IV fluids and zofran. Patient aware and agreeable  DIFFERENTIAL DIAGNOSIS  Diverticulitis, diverticulosis, colitis, bowel obstruction, ileus, mesenteric adenitis.  ED COURSE  Labs are overall reassuring.  Urinalysis shows moderate leukocytes and 6-10 white blood cells but no bacteria.  Patient denies urinary symptoms.  CT of the abdomen and pelvis is somewhat concerning for an ileus versus partial small bowel obstruction.  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  There is an indeterminate low-attenuation renal cortical lesions within the upper right kidney.  Plan will be to admit the patient at least for observation and serial abdominal exams.  Patient accepted for admission by hospitalist services.    As part of my medical decision making, I reviewed the following data within the [place] reviewed, Old chart reviewed, Discussed with admitting physician, Discussed with ED  attending, Dr. Jari Pigg. Notes from prior ED visits, and [place] Controlled Substance Database  ___________________________________________   FINAL CLINICAL IMPRESSION(S) / ED DIAGNOSES  Final diagnoses:  Left upper quadrant abdominal pain  Acute abdominal pain in left flank     ED Discharge Orders     None       Note:  This document was prepared using Dragon voice recognition software and may include unintentional dictation errors.    Victorino Dike, FNP 06/11/21 1818    Vanessa [place], MD 06/12/21 [phone]

## 2021-06-11 NOTE — Plan of Care (Signed)

## 2021-06-11 NOTE — Assessment & Plan Note (Addendum)
Patient reports having history of lung cancer and has completed treatment recently at [facility]. Continue outpatient follow-up.

## 2021-06-11 NOTE — Assessment & Plan Note (Addendum)
Resolved. Patient presented with abdominal pain, nausea and diarrhea. Differentials include partial small bowel obstruction, chronic mesenteric ischemia -Continue to monitor

## 2021-06-11 NOTE — H&P (Addendum)
History and Physical    Patient: [name] [name] JOA:[id number] DOB: September 25, 1955 DOA: 06/11/2021 DOS: the patient was seen and examined on 06/11/2021  PCP: Cletis Athens, MD   Patient coming from: Home  Chief Complaint:  Chief Complaint  Patient presents with   Abdominal Pain   Diarrhea   HPI: [name] [name] is a 66 y.o. female with PMH significant for essential hypertension, hyperlipidemia, major depression,  lung cancer, completed chemotherapy recently at [facility], COPD not on home oxygen presented in the ED with left flank pain for 3 days.  Patient denies any triggers for having left flank pain. She describes pain as sharp 5/10 on pain scale radiating to her whole abdomen associated with nausea, she denies throwing up. She also reports having fever 104 at home 3 days ago, denies any fever afterwards.  Patient denies any sick contacts, recent travel, constipation.  Patient also reports having loose watery stools for last 2 days which is improving.  ED course: She is hemodynamically stable except hypotension. Temp 98.6, HR 61, RR 17, BP 105/45/SPO2 97% on room air.   Labs include 135, potassium 3.8, chloride 106, bicarb 22, glucose 103, BUN 15, creatinine 0.75, calcium 8.5, anion gap 7, alkaline phosphatase 60, albumin 3.4, lipase 30, AST 14, ALT 14, total protein 6.7, WBC 7.8, hemoglobin 12.3, hematocrit 37.7, MCV 101, platelet 318, UA: Shows moderate leukocytes, no bacteria, no nitrites. CT A/P: Chronic generalized duodenal dilatation consistent with partial small bowel obstruction.  Chronic changes consistent with chronic mesenteric ischemic, if patient has any acute change in the status recommend vascular surgery consult.   Review of Systems:   Review of Systems  Constitutional:  Positive for chills and fever.  HENT: Negative.    Eyes: Negative.   Respiratory: Negative.    Cardiovascular: Negative.   Gastrointestinal:  Positive for abdominal pain, diarrhea and nausea.   Genitourinary: Negative.   Musculoskeletal: Negative.   Skin: Negative.   Neurological: Negative.   Endo/Heme/Allergies: Negative.   Psychiatric/Behavioral:  Positive for depression.     Past Medical History:  Diagnosis Date   Bowel obstruction ([place])    x1 month ago   Cancer [facility])    stomach cancer per pt and currently lung ca   COPD (chronic obstructive pulmonary disease) ([place])    Essential hypertension 12/02/2019   Lung cancer ([place])    Personal history of radiation therapy 2019-2020   lung ca   Past Surgical History:  Procedure Laterality Date   COLON SURGERY     Social History:  reports that she has been smoking cigarettes. She has been smoking an average of .25 packs per day. She has never used smokeless tobacco. She reports current alcohol use. She reports that she does not use drugs.  Allergies  Allergen Reactions   Aspirin Hives and Itching    Family History  Problem Relation Age of Onset   Lupus Sister    Heart failure Neg Hx    Diabetes Neg Hx     Prior to Admission medications   Medication Sig Start Date End Date Taking? Authorizing Provider  atorvastatin (LIPITOR) 40 MG tablet Take 40 mg by mouth daily.   Yes [provider]  buPROPion (WELLBUTRIN SR) 100 MG 12 hr tablet Take 1 tablet (100 mg total) by mouth 2 (two) times daily. 09/17/19  Yes Masoud, Viann Shove, MD  cefUROXime (CEFTIN) 500 MG tablet Take 500 mg by mouth 2 (two) times daily with a meal.   Yes [provider]  cholestyramine (QUESTRAN) 4 GM/DOSE powder TAKE 1 SCOOPFUL(4 GRAMS) MIXED IN 4-8 OUNCES OF LIQUID AND DRINK, DAILY 12/13/20  Yes Masoud, Viann Shove, MD  cyclobenzaprine (FLEXERIL) 5 MG tablet Take 5 mg by mouth 3 (three) times daily as needed for muscle spasms.   Yes [provider]  cycloSPORINE (RESTASIS) 0.05 % ophthalmic emulsion Place 1 drop into both eyes every 12 (twelve) hours.   Yes [provider]  hydrochlorothiazide (MICROZIDE) 12.5 MG capsule Take 12.5  mg by mouth daily.   Yes [provider]  HYDROcodone-acetaminophen (NORCO/VICODIN) 5-325 MG tablet Take 1 tablet by mouth every 6 (six) hours as needed for moderate pain. 08/30/19  Yes Letitia Neri L, PA-C  hydrOXYzine (ATARAX/VISTARIL) 25 MG tablet Take 25 mg by mouth 3 (three) times daily as needed.   Yes [provider]  lipase/protease/amylase (CREON) 12000-38000 units CPEP capsule Take by mouth.   Yes [provider]  lisinopril (ZESTRIL) 40 MG tablet Take 1 tablet (40 mg total) by mouth daily. 12/08/20  Yes Masoud, Viann Shove, MD  methocarbamol (ROBAXIN) 500 MG tablet Take 1 tablet (500 mg total) by mouth every 8 (eight) hours as needed for muscle spasms. 08/30/19  Yes Letitia Neri L, PA-C  mirtazapine (REMERON) 15 MG tablet Take 15 mg by mouth daily.   Yes [provider]  omeprazole (PRILOSEC) 20 MG capsule TAKE 1 CAPSULE BY MOUTH TWICE DAILY 07/20/20  Yes Masoud, Viann Shove, MD  ondansetron (ZOFRAN ODT) 4 MG disintegrating tablet Take 1 tablet (4 mg total) by mouth every 8 (eight) hours as needed. 07/03/19  Yes Veronese, [place], MD  pantoprazole (PROTONIX) 40 MG tablet TAKE 1 TABLET BY MOUTH ONCE DAILY 05/18/21  Yes Masoud, Viann Shove, MD  predniSONE (DELTASONE) 10 MG tablet Take 1 tablet (10 mg total) by mouth daily. Day 1-3: take 4 tablets PO daily Day 4-6: take 3 tablets PO daily Day 7-9: take 2 tablets PO daily Day 10-12: take 1 tablet PO daily 08/04/20  Yes Paduchowski, Lennette Bihari, MD  pregabalin (LYRICA) 100 MG capsule Take 100 mg by mouth 3 (three) times daily.    Yes [provider]  QUEtiapine (SEROQUEL) 100 MG tablet Take 100 mg by mouth at bedtime. 04/06/21  Yes [provider]  sertraline (ZOLOFT) 50 MG tablet TAKE 1 TABLET BY MOUTH TWICE DAILY 02/03/21  Yes Masoud, Viann Shove, MD  SYMBICORT 160-4.5 MCG/ACT inhaler INHALE 2 PUFFS TWICE A DAY RINSE MOUTH WITH WATER AFTER EACH USE 04/06/21  Yes Masoud, Viann Shove, MD  vitamin B-12 (CYANOCOBALAMIN) 250 MCG tablet  Take 250 mcg by mouth daily.   Yes [provider]  acetaminophen (TYLENOL) 160 MG/5ML solution Take 5 mLs (160 mg total) by mouth every 6 (six) hours as needed for mild pain. 09/17/19   Cletis Athens, MD  albuterol (PROVENTIL) (2.5 MG/3ML) 0.083% nebulizer solution Take 3 mLs (2.5 mg total) by nebulization 3 (three) times daily as needed for wheezing. 09/09/16   Theodoro Grist, MD  feeding supplement, ENSURE ENLIVE, (ENSURE ENLIVE) LIQD Take 237 mLs by mouth 2 (two) times daily between meals. 09/10/16   Theodoro Grist, MD  fluticasone (FLONASE) 50 MCG/ACT nasal spray USE 1 SPRAY INTO EACH NOSTRIL TWICE DAILY 10/25/20   Cletis Athens, MD  Vitamin D, Ergocalciferol, (DRISDOL) 1.25 MG (50000 UNIT) CAPS capsule TAKE 1 CAPSULE BY MOUTH WEEKLY Patient not taking: Reported on 06/11/2021 07/31/20   Cletis Athens, MD  Water For Irrigation, Sterile (FREE WATER) SOLN Place 60 mLs into feeding tube every 8 (eight) hours.  05/29/14   Fritzi Mandes, MD    Physical Exam: Vitals:   06/11/21 1343 06/11/21 1400 06/11/21 1625 06/11/21 1630  BP: (!) 135/56 (!) 134/56 138/62 (!) 140/58  Pulse: 64 63 69 61  Resp: 16 17 17    Temp:      TempSrc:      SpO2: 99% 96% 94% 95%  Weight:      Height:       General exam: Appears comfortable, not in any acute distress.  Deconditioned Respiratory system: CTA bilaterally, no wheezing, no crackles, normal respiratory effort RR 15. Cardiovascular system: S1-S2 heard, regular rate and rhythm, no murmur. Gastrointestinal system: Abdomen is soft, non distended, generalized tenderness++, left flank tenderness+, BS+ Central nervous system: Alert, oriented x3, no focal neurological deficits. Extremities: No edema, no cyanosis, no clubbing. Psychiatry: Mood, insight, judgment normal.   Data Reviewed: I have Reviewed nursing notes, Vitals, and Lab results since pt's last encounter. Pertinent lab results CBC, CMP, lipase, mag, Phos I have ordered test including CBC, CMP, lactic  acid I have independently visualized and interpreted imaging CT abdomen which showed finding consistent with partial small bowel obstruction, chronic mesenteric ischemia. I have discussed pt's care plan and test results with patient.   EKG: Normal sinus rhythm, bilateral atrial enlargement, left ventricular hypertrophy  Assessment and Plan: * Partial small bowel obstruction ([place]) Patient presented with left flank pain radiating towards whole abdomen for 3 days associated with nausea,  denies any vomiting. CT A/P showed findings consistent with partial small bowel obstruction. Continue SBO protocol, bowel rest, IV hydration. Adequate pain control with morphine as needed. Continue Zofran as needed for nausea and vomiting. Continue to observe, if pain improves consider clear liquid diet in a.m.   Abdominal pain Patient presented with abdominal pain, nausea and diarrhea. Differentials include partial small bowel obstruction, chronic mesenteric ischemia Continue SBO protocol, obtain lactic acid. If pain does not improve,  consider repeat imaging and general surgery consult in a.m. UA shows leukocytes+, nitrites-, bacteria-, patient denies any UTI symptoms. We will hold on antibiotics at this time.  Essential hypertension Continue hydralazine IV as needed. Resume home blood pressure medications when able to take p.o.   Adenocarcinoma of lung [facility]) Patient reports having history of lung cancer and has completed treatment recently at [facility]. Continue outpatient follow-up.  COPD (chronic obstructive pulmonary disease) ([place]) Patient reports stable COPD,  not using oxygen at home. Continue home inhalers(albuterol and Symbicort)    Advance Care Planning:   Code Status: Full Code   Consults: None  Family Communication: No family at bed side.  Severity of Illness: The appropriate patient status for this patient is OBSERVATION. Observation status is judged to be reasonable and  necessary in order to provide the required intensity of service to ensure the patient's safety. The patient's presenting symptoms, physical exam findings, and initial radiographic and laboratory data in the context of their medical condition is felt to place them at decreased risk for further clinical deterioration. Furthermore, it is anticipated that the patient will be medically stable for discharge from the hospital within 2 midnights of admission.   Author: Shawna Clamp, MD 06/11/2021 5:13 PM  For on call review [email or web address].

## 2021-06-11 NOTE — ED Notes (Signed)
Transportation requested  

## 2021-06-11 NOTE — Assessment & Plan Note (Addendum)
Patient presented with left flank pain radiating towards whole abdomen for 3 days associated with nausea,  denies any vomiting. CT A/P showed findings consistent with questionable partial small bowel obstruction.  She was placed n.p.o. with IV hydration. Continue to have bowel movements and abdominal pain resolved. -Start her on clear liquid and advance as tolerated. -Continue to monitor -Continue with supportive care

## 2021-06-11 NOTE — Assessment & Plan Note (Signed)
Continue hydralazine IV as needed. Resume home blood pressure medications when able to take p.o.

## 2021-06-12 DIAGNOSIS — Z85028 Personal history of other malignant neoplasm of stomach: Secondary | ICD-10-CM | POA: Diagnosis not present

## 2021-06-12 DIAGNOSIS — Z681 Body mass index (BMI) 19 or less, adult: Secondary | ICD-10-CM | POA: Diagnosis not present

## 2021-06-12 DIAGNOSIS — Z923 Personal history of irradiation: Secondary | ICD-10-CM | POA: Diagnosis not present

## 2021-06-12 DIAGNOSIS — E785 Hyperlipidemia, unspecified: Secondary | ICD-10-CM | POA: Diagnosis present

## 2021-06-12 DIAGNOSIS — Z832 Family history of diseases of the blood and blood-forming organs and certain disorders involving the immune mechanism: Secondary | ICD-10-CM | POA: Diagnosis not present

## 2021-06-12 DIAGNOSIS — Z7952 Long term (current) use of systemic steroids: Secondary | ICD-10-CM | POA: Diagnosis not present

## 2021-06-12 DIAGNOSIS — I10 Essential (primary) hypertension: Secondary | ICD-10-CM | POA: Diagnosis present

## 2021-06-12 DIAGNOSIS — Z85118 Personal history of other malignant neoplasm of bronchus and lung: Secondary | ICD-10-CM | POA: Diagnosis not present

## 2021-06-12 DIAGNOSIS — Z79899 Other long term (current) drug therapy: Secondary | ICD-10-CM | POA: Diagnosis not present

## 2021-06-12 DIAGNOSIS — K56600 Partial intestinal obstruction, unspecified as to cause: Secondary | ICD-10-CM | POA: Diagnosis present

## 2021-06-12 DIAGNOSIS — R935 Abnormal findings on diagnostic imaging of other abdominal regions, including retroperitoneum: Secondary | ICD-10-CM | POA: Diagnosis present

## 2021-06-12 DIAGNOSIS — Z7951 Long term (current) use of inhaled steroids: Secondary | ICD-10-CM | POA: Diagnosis not present

## 2021-06-12 DIAGNOSIS — I959 Hypotension, unspecified: Secondary | ICD-10-CM | POA: Diagnosis present

## 2021-06-12 DIAGNOSIS — E43 Unspecified severe protein-calorie malnutrition: Secondary | ICD-10-CM | POA: Diagnosis present

## 2021-06-12 DIAGNOSIS — F17210 Nicotine dependence, cigarettes, uncomplicated: Secondary | ICD-10-CM | POA: Diagnosis present

## 2021-06-12 DIAGNOSIS — Z9221 Personal history of antineoplastic chemotherapy: Secondary | ICD-10-CM | POA: Diagnosis not present

## 2021-06-12 DIAGNOSIS — J449 Chronic obstructive pulmonary disease, unspecified: Secondary | ICD-10-CM | POA: Diagnosis present

## 2021-06-12 LAB — CBC
HCT: 32.8 % — ABNORMAL LOW (ref 36.0–46.0)
Hemoglobin: 10.5 g/dL — ABNORMAL LOW (ref 12.0–15.0)
MCH: 32.4 pg (ref 26.0–34.0)
MCHC: 32 g/dL (ref 30.0–36.0)
MCV: 101.2 fL — ABNORMAL HIGH (ref 80.0–100.0)
Platelets: 293 10*3/uL (ref 150–400)
RBC: 3.24 MIL/uL — ABNORMAL LOW (ref 3.87–5.11)
RDW: 12.2 % (ref 11.5–15.5)
WBC: 8.1 10*3/uL (ref 4.0–10.5)
nRBC: 0 % (ref 0.0–0.2)

## 2021-06-12 LAB — PHOSPHORUS: Phosphorus: 2.7 mg/dL (ref 2.5–4.6)

## 2021-06-12 LAB — COMPREHENSIVE METABOLIC PANEL
ALT: 11 U/L (ref 0–44)
AST: 11 U/L — ABNORMAL LOW (ref 15–41)
Albumin: 2.9 g/dL — ABNORMAL LOW (ref 3.5–5.0)
Alkaline Phosphatase: 56 U/L (ref 38–126)
Anion gap: 5 (ref 5–15)
BUN: 10 mg/dL (ref 8–23)
CO2: 25 mmol/L (ref 22–32)
Calcium: 8.3 mg/dL — ABNORMAL LOW (ref 8.9–10.3)
Chloride: 111 mmol/L (ref 98–111)
Creatinine, Ser: 0.5 mg/dL (ref 0.44–1.00)
GFR, Estimated: >60 mL/min (ref >60)
Glucose, Bld: 84 mg/dL (ref 70–99)
Potassium: 3.9 mmol/L (ref 3.5–5.1)
Sodium: 141 mmol/L (ref 135–145)
Total Bilirubin: 0.5 mg/dL (ref 0.3–1.2)
Total Protein: 5.9 g/dL — ABNORMAL LOW (ref 6.5–8.1)

## 2021-06-12 LAB — MAGNESIUM: Magnesium: 2 mg/dL (ref 1.7–2.4)

## 2021-06-12 MED ORDER — FLUTICASONE FUROATE-VILANTEROL 200-25 MCG/ACT IN AEPB
1.0000 | INHALATION_SPRAY | Freq: Every day | RESPIRATORY_TRACT | Status: DC
  Administered 2021-06-12 – 2021-06-13 (×2): 1 via RESPIRATORY_TRACT
  Filled 2021-06-12: qty 28

## 2021-06-12 MED ORDER — PANTOPRAZOLE SODIUM 40 MG PO TBEC
40.0000 mg | DELAYED_RELEASE_TABLET | Freq: Every day | ORAL | Status: DC
Start: 2021-06-12 — End: 2021-06-13
  Administered 2021-06-12 – 2021-06-13 (×2): 40 mg via ORAL
  Filled 2021-06-12 (×2): qty 1

## 2021-06-12 MED ORDER — PREGABALIN 50 MG PO CAPS
100.0000 mg | ORAL_CAPSULE | Freq: Three times a day (TID) | ORAL | Status: DC
Start: 2021-06-12 — End: 2021-06-13
  Administered 2021-06-12 – 2021-06-13 (×3): 100 mg via ORAL
  Filled 2021-06-12 (×3): qty 2

## 2021-06-12 MED ORDER — QUETIAPINE FUMARATE 25 MG PO TABS
100.0000 mg | ORAL_TABLET | Freq: Every day | ORAL | Status: DC
  Administered 2021-06-12: 100 mg via ORAL
  Filled 2021-06-12: qty 4

## 2021-06-12 MED ORDER — ALBUTEROL SULFATE (2.5 MG/3ML) 0.083% IN NEBU: 2.5000 mg | INHALATION_SOLUTION | Freq: Three times a day (TID) | RESPIRATORY_TRACT | Status: DC | PRN

## 2021-06-12 MED ORDER — CYCLOSPORINE 0.05 % OP EMUL
1.0000 [drp] | Freq: Two times a day (BID) | OPHTHALMIC | Status: DC
  Administered 2021-06-12 – 2021-06-13 (×3): 1 [drp] via OPHTHALMIC
  Filled 2021-06-12 (×3): qty 30

## 2021-06-12 MED ORDER — ENSURE ENLIVE PO LIQD
237.0000 mL | Freq: Two times a day (BID) | ORAL | Status: DC
  Administered 2021-06-12 – 2021-06-13 (×2): 237 mL via ORAL

## 2021-06-12 MED ORDER — CHOLESTYRAMINE 4 G PO PACK
4.0000 g | PACK | Freq: Every day | ORAL | Status: DC
  Administered 2021-06-12 – 2021-06-13 (×2): 4 g via ORAL
  Filled 2021-06-12 (×2): qty 1

## 2021-06-12 MED ORDER — LISINOPRIL 20 MG PO TABS
40.0000 mg | ORAL_TABLET | Freq: Every day | ORAL | Status: DC
  Administered 2021-06-12 – 2021-06-13 (×2): 40 mg via ORAL
  Filled 2021-06-12 (×2): qty 2

## 2021-06-12 MED ORDER — BUPROPION HCL ER (SR) 100 MG PO TB12
100.0000 mg | ORAL_TABLET | Freq: Two times a day (BID) | ORAL | Status: DC
  Administered 2021-06-12 – 2021-06-13 (×3): 100 mg via ORAL
  Filled 2021-06-12 (×3): qty 1

## 2021-06-12 MED ORDER — MIRTAZAPINE 15 MG PO TABS
15.0000 mg | ORAL_TABLET | Freq: Every day | ORAL | Status: DC
  Administered 2021-06-12: 15 mg via ORAL
  Filled 2021-06-12: qty 1

## 2021-06-12 MED ORDER — PANCRELIPASE (LIP-PROT-AMYL) 12000-38000 UNITS PO CPEP
12000.0000 [IU] | ORAL_CAPSULE | Freq: Three times a day (TID) | ORAL | Status: DC
  Administered 2021-06-12 – 2021-06-13 (×2): 12000 [IU] via ORAL
  Filled 2021-06-12 (×2): qty 1

## 2021-06-12 MED ORDER — ATORVASTATIN CALCIUM 20 MG PO TABS
40.0000 mg | ORAL_TABLET | Freq: Every day | ORAL | Status: DC
  Administered 2021-06-12 – 2021-06-13 (×2): 40 mg via ORAL
  Filled 2021-06-12 (×2): qty 2

## 2021-06-12 MED ORDER — SERTRALINE HCL 50 MG PO TABS
50.0000 mg | ORAL_TABLET | Freq: Two times a day (BID) | ORAL | Status: DC
  Administered 2021-06-12 – 2021-06-13 (×2): 50 mg via ORAL
  Filled 2021-06-12 (×3): qty 1

## 2021-06-12 NOTE — Plan of Care (Signed)

## 2021-06-12 NOTE — Progress Notes (Signed)
Progress Note   Patient: [name] [name] JXB:[id number] DOB: 06-20-1955 DOA: 06/11/2021     0 DOS: the patient was seen and examined on 06/12/2021   Brief hospital course: This 66 years old female with PMH significant for essential hypertension, hyperlipidemia, major depression,  lung cancer, completed chemotherapy recently at [facility], COPD not on home oxygen presented in the ED with left flank pain for 3 days.  Patient denies any triggers for having left flank pain. She describes pain as sharp 5/10 on pain scale radiating to her whole abdomen associated with nausea, she denies throwing up. She also reports having fever 104 at home 3 days ago, denies any fever afterwards.  Patient denies any sick contacts, recent travel, constipation.   ED course: She is hemodynamically stable except hypotension. Temp 98.6, HR 61, RR 17, BP 105/45/SPO2 97% on room air.   Labs include 135, potassium 3.8, chloride 106, bicarb 22, glucose 103, BUN 15, creatinine 0.75, calcium 8.5, anion gap 7, alkaline phosphatase 60, albumin 3.4, lipase 30, AST 14, ALT 14, total protein 6.7, WBC 7.8, hemoglobin 12.3, hematocrit 37.7, MCV 101, platelet 318, UA: Shows moderate leukocytes, no bacteria, no nitrites. CT A/P: Chronic generalized duodenal dilatation consistent with partial small bowel obstruction.  Chronic changes consistent with chronic mesenteric ischemic, if patient has any acute change in the status recommend vascular surgery consult.  5/20: Patient seems improving.  Abdominal pain resolved.  No nausea or vomiting.  Continued to have loose bowel movements but they also seems improving.  Had 1 since this morning. She was feeling hungry and asking for diet.  She was started on clear liquid diet with instructions to advance as tolerated.   Assessment and Plan: * Partial small bowel obstruction ([place]) Patient presented with left flank pain radiating towards whole abdomen for 3 days associated with nausea,  denies any  vomiting. CT A/P showed findings consistent with questionable partial small bowel obstruction.  She was placed n.p.o. with IV hydration. Continue to have bowel movements and abdominal pain resolved. -Start her on clear liquid and advance as tolerated. -Continue to monitor -Continue with supportive care   Abdominal pain Resolved. Patient presented with abdominal pain, nausea and diarrhea. Differentials include partial small bowel obstruction, chronic mesenteric ischemia -Continue to monitor   Abnormal CT of the abdomen Patient has a lot of abnormal but stable CT abdomen findings which need outpatient follow-up. 1. Chronic generalized duodenal dilatation remains without wall thickening. The amount of dilatation has mildly increased compared to previous studies and there is an air-fluid level in this region. Ileus or partial obstruction are not excluded in this region on the study. 2. 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 Both reconstitute more distally. This finding is stable since June of 2016 and nonacute. Recommend clinical correlation for signs of chronic mesenteric ischemia. If the patient is having such symptoms, the patient could be referred to vascular surgery or interventional radiology for further assessment. 3. There is a 1.2 x 1.3 cm high attenuation mass in the left adnexa which is unchanged since March of 2022. Further evaluation with pelvic ultrasound as an outpatient may better evaluate this region. 4. Indeterminate low-attenuation renal cortical lesions with the largest measuring up to 1.5 cm in the upper right kidney. These findings are stable since June of 2021. A renal mass protocol MRI is recommended for further evaluation as an outpatient.  Protein-calorie malnutrition, severe ([place]) Estimated body mass index is 15.82 kg/m as calculated  from the following:   Height as of this encounter: 5\' 6"  (1.676 m).   Weight as of  this encounter: 44.5 kg.   - Dietitian consult  Essential hypertension Blood pressure mildly elevated.  Was on HCTZ and lisinopril at home. -Restart lisinopril.   Adenocarcinoma of lung [facility]) Patient reports having history of lung cancer and has completed treatment recently at [facility]. Continue outpatient follow-up.  COPD (chronic obstructive pulmonary disease) ([place]) Patient reports stable COPD,  not using oxygen at home. -Continue home inhalers(albuterol and Symbicort)   Subjective: Patient was feeling much improved, abdominal pain resolved, no nausea or vomiting.  Just had 1 bowel movement which was soft.  She was feeling hungry and asking for food.  Physical Exam: Vitals:   06/11/21 1723 06/11/21 2014 06/12/21 0446 06/12/21 0800  BP: 137/66 (!) 141/47 (!) 132/46 (!) 135/44  Pulse: 62 73 (!) 59 (!) 55  Resp: 17 19 14 16   Temp: 98.3 F (36.8 C) 99.6 F (37.6 C) 98.3 F (36.8 C) 98.7 F (37.1 C)  TempSrc: Oral Oral  Oral  SpO2: 100% 97% 98% 99%  Weight:      Height:       General.  Malnourished lady, in no acute distress. Pulmonary.  Lungs clear bilaterally, normal respiratory effort. CV.  Regular rate and rhythm, no JVD, rub or murmur. Abdomen.  Soft, nontender, nondistended, BS positive. CNS.  Alert and oriented .  No focal neurologic deficit. Extremities.  No edema, no cyanosis, pulses intact and symmetrical. Psychiatry.  Judgment and insight appears normal.  Data Reviewed: Prior notes, labs and images reviewed  Family Communication:   Disposition: Status is: Inpatient Remains inpatient appropriate because: Severity of illness   Planned Discharge Destination: Home  DVT prophylaxis.  Lovenox Time spent: 45 minutes  This record has been created using Systems analyst. Errors have been sought and corrected,but may not always be located. Such creation errors do not reflect on the standard of care.  Author: Lorella Nimrod,  MD 06/12/2021 2:21 PM  For on call review [email or web address].

## 2021-06-12 NOTE — Assessment & Plan Note (Signed)
Estimated body mass index is 15.82 kg/m as calculated from the following:   Height as of this encounter: 5\' 6"  (1.676 m).   Weight as of this encounter: 44.5 kg.   - Dietitian consult

## 2021-06-12 NOTE — Progress Notes (Signed)
Mobility Specialist - Progress Note   06/12/21 1500  Mobility  Activity Ambulated independently in hallway;Stood at bedside;Dangled on edge of bed  Level of Assistance Standby assist, set-up cues, supervision of patient - no hands on  Assistive Device Front wheel walker  Distance Ambulated (ft) 200 ft  Activity Response Tolerated well  $Mobility charge 1 Mobility    Pt sem supine upon arrival using RA. Completes bed mobility indep and STS ModI. Ambulates 1 lap SBA taking 2 breaks d/t dizziness (states its from being NPO). Pt return tor recliner with alarm set and needs in reach.  Merrily Brittle Mobility Specialist 06/12/21, 3:06 PM

## 2021-06-12 NOTE — Assessment & Plan Note (Signed)
Patient has a lot of abnormal but stable CT abdomen findings which need outpatient follow-up. 1. Chronic generalized duodenal dilatation remains without wall thickening. The amount of dilatation has mildly increased compared to previous studies and there is an air-fluid level in this region. Ileus or partial obstruction are not excluded in this region on the study. 2. 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 Both reconstitute more distally. This finding is stable since June of 2016 and nonacute. Recommend clinical correlation for signs of chronic mesenteric ischemia. If the patient is having such symptoms, the patient could be referred to vascular surgery or interventional radiology for further assessment. 3. There is a 1.2 x 1.3 cm high attenuation mass in the left adnexa which is unchanged since March of 2022. Further evaluation with pelvic ultrasound as an outpatient may better evaluate this region. 4. Indeterminate low-attenuation renal cortical lesions with the largest measuring up to 1.5 cm in the upper right kidney. These findings are stable since June of 2021. A renal mass protocol MRI is recommended for further evaluation as an outpatient.

## 2021-06-13 MED ORDER — ONDANSETRON HCL 4 MG PO TABS: 4.0000 mg | ORAL_TABLET | Freq: Four times a day (QID) | ORAL | 0 refills | Status: DC | PRN

## 2021-06-13 NOTE — Discharge Summary (Signed)
Physician Discharge Summary   Patient: [name] [name] MRN: [id number] DOB: 07-05-1955  Admit date:     06/11/2021  Discharge date: 06/13/21  Discharge Physician: Lorella Nimrod   PCP: Cletis Athens, MD   Recommendations at discharge:  CBC and BMP in 1 to 2 weeks Follow-up with primary care provider within a week  Discharge Diagnoses: Principal Problem:   Partial small bowel obstruction ([place]) Active Problems:   Abdominal pain   Abnormal CT of the abdomen   Protein-calorie malnutrition, severe (H[facility])   COPD (chronic obstructive pulmonary disease) ([place])   Adenocarcinoma of lung ([place])   Essential hypertension   Hospital Course: This 66 years old female with PMH significant for essential hypertension, hyperlipidemia, major depression,  lung cancer, completed chemotherapy recently at [facility], COPD not on home oxygen presented in the ED with left flank pain for 3 days.  Patient denies any triggers for having left flank pain. She describes pain as sharp 5/10 on pain scale radiating to her whole abdomen associated with nausea, she denies throwing up. She also reports having fever 104 at home 3 days ago, denies any fever afterwards.  Patient denies any sick contacts, recent travel, constipation.   ED course: She is hemodynamically stable except hypotension. Temp 98.6, HR 61, RR 17, BP 105/45/SPO2 97% on room air.   Labs include 135, potassium 3.8, chloride 106, bicarb 22, glucose 103, BUN 15, creatinine 0.75, calcium 8.5, anion gap 7, alkaline phosphatase 60, albumin 3.4, lipase 30, AST 14, ALT 14, total protein 6.7, WBC 7.8, hemoglobin 12.3, hematocrit 37.7, MCV 101, platelet 318, UA: Shows moderate leukocytes, no bacteria, no nitrites. CT A/P: Chronic generalized duodenal dilatation consistent with partial small bowel obstruction.  Chronic changes consistent with chronic mesenteric ischemic, if patient has any acute change in the status recommend vascular surgery consult.  5/20: Patient  seems improving.  Abdominal pain resolved.  No nausea or vomiting.  Continued to have loose bowel movements but they also seems improving.  Had 1 since this morning. She was feeling hungry and asking for diet.  She was started on clear liquid diet with instructions to advance as tolerated.  5/21: Patient remained stable.  Abdominal pain resolved.  No more nausea or vomiting.  Able to tolerate diet.  Diarrhea resolved.  She wants to go home.  Patient is being discharged with some Zofran to be used only as needed. She was advised to keep herself well-hydrated and supplement her diet with Ensure and boost to meet her nutritional requirement as she appears malnourished.  She will continue current medication and will follow-up with her providers.  Assessment and Plan: * Partial small bowel obstruction ([place]) Patient presented with left flank pain radiating towards whole abdomen for 3 days associated with nausea,  denies any vomiting. CT A/P showed findings consistent with questionable partial small bowel obstruction.  She was placed n.p.o. with IV hydration. Continue to have bowel movements and abdominal pain resolved. -Start her on clear liquid and advance as tolerated. -Continue to monitor -Continue with supportive care   Abdominal pain Resolved. Patient presented with abdominal pain, nausea and diarrhea. Differentials include partial small bowel obstruction, chronic mesenteric ischemia -Continue to monitor   Abnormal CT of the abdomen Patient has a lot of abnormal but stable CT abdomen findings which need outpatient follow-up. 1. Chronic generalized duodenal dilatation remains without wall thickening. The amount of dilatation has mildly increased compared to previous studies and there is an air-fluid level in this region. Ileus  or partial obstruction are not excluded in this region on the study. 2. 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 Both  reconstitute more distally. This finding is stable since June of 2016 and nonacute. Recommend clinical correlation for signs of chronic mesenteric ischemia. If the patient is having such symptoms, the patient could be referred to vascular surgery or interventional radiology for further assessment. 3. There is a 1.2 x 1.3 cm high attenuation mass in the left adnexa which is unchanged since March of 2022. Further evaluation with pelvic ultrasound as an outpatient may better evaluate this region. 4. Indeterminate low-attenuation renal cortical lesions with the largest measuring up to 1.5 cm in the upper right kidney. These findings are stable since June of 2021. A renal mass protocol MRI is recommended for further evaluation as an outpatient.  Protein-calorie malnutrition, severe ([place]) Estimated body mass index is 15.82 kg/m as calculated from the following:   Height as of this encounter: 5\' 6"  (1.676 m).   Weight as of this encounter: 44.5 kg.   - Dietitian consult  Essential hypertension Blood pressure mildly elevated.  Was on HCTZ and lisinopril at home. -Restart lisinopril.   Adenocarcinoma of lung [facility]) Patient reports having history of lung cancer and has completed treatment recently at [facility]. Continue outpatient follow-up.  COPD (chronic obstructive pulmonary disease) ([place]) Patient reports stable COPD,  not using oxygen at home. -Continue home inhalers(albuterol and Symbicort)   Consultants: None Procedures performed: None Disposition: Home Diet recommendation:  Discharge Diet Orders (From admission, onward)     Start     Ordered   06/13/21 0000  Diet - low sodium heart healthy        06/13/21 0946           Cardiac diet DISCHARGE MEDICATION: Allergies as of 06/13/2021       Reactions   Aspirin Hives, Itching        Medication List     STOP taking these medications    cefUROXime 500 MG tablet Commonly known as: CEFTIN   cyclobenzaprine  5 MG tablet Commonly known as: FLEXERIL   free water Soln   HYDROcodone-acetaminophen 5-325 MG tablet Commonly known as: NORCO/VICODIN   methocarbamol 500 MG tablet Commonly known as: Robaxin   ondansetron 4 MG disintegrating tablet Commonly known as: Zofran ODT   pantoprazole 40 MG tablet Commonly known as: PROTONIX   predniSONE 10 MG tablet Commonly known as: DELTASONE   Vitamin D (Ergocalciferol) 1.25 MG (50000 UNIT) Caps capsule Commonly known as: DRISDOL       TAKE these medications    acetaminophen 160 MG/5ML solution Commonly known as: TYLENOL Take 5 mLs (160 mg total) by mouth every 6 (six) hours as needed for mild pain.   albuterol (2.5 MG/3ML) 0.083% nebulizer solution Commonly known as: PROVENTIL Take 3 mLs (2.5 mg total) by nebulization 3 (three) times daily as needed for wheezing.   atorvastatin 40 MG tablet Commonly known as: LIPITOR Take 40 mg by mouth daily.   buPROPion ER 100 MG 12 hr tablet Commonly known as: Wellbutrin SR Take 1 tablet (100 mg total) by mouth 2 (two) times daily.   cholestyramine 4 GM/DOSE powder Commonly known as: QUESTRAN TAKE 1 SCOOPFUL(4 GRAMS) MIXED IN 4-8 OUNCES OF LIQUID AND DRINK, DAILY   cycloSPORINE 0.05 % ophthalmic emulsion Commonly known as: RESTASIS Place 1 drop into both eyes every 12 (twelve) hours.   feeding supplement Liqd Take 237 mLs by mouth  2 (two) times daily between meals.   fluticasone 50 MCG/ACT nasal spray Commonly known as: FLONASE USE 1 SPRAY INTO EACH NOSTRIL TWICE DAILY   hydrochlorothiazide 12.5 MG capsule Commonly known as: MICROZIDE Take 12.5 mg by mouth daily.   hydrOXYzine 25 MG tablet Commonly known as: ATARAX Take 25 mg by mouth 3 (three) times daily as needed.   lipase/protease/amylase 12000-38000 units Cpep capsule Commonly known as: CREON Take by mouth.   lisinopril 40 MG tablet Commonly known as: ZESTRIL Take 1 tablet (40 mg total) by mouth daily.   mirtazapine 15  MG tablet Commonly known as: REMERON Take 15 mg by mouth daily.   omeprazole 20 MG capsule Commonly known as: PRILOSEC TAKE 1 CAPSULE BY MOUTH TWICE DAILY   ondansetron 4 MG tablet Commonly known as: ZOFRAN Take 1 tablet (4 mg total) by mouth every 6 (six) hours as needed for nausea.   pregabalin 100 MG capsule Commonly known as: LYRICA Take 100 mg by mouth 3 (three) times daily.   QUEtiapine 100 MG tablet Commonly known as: SEROQUEL Take 100 mg by mouth at bedtime.   sertraline 50 MG tablet Commonly known as: ZOLOFT TAKE 1 TABLET BY MOUTH TWICE DAILY   Symbicort 160-4.5 MCG/ACT inhaler Generic drug: budesonide-formoterol INHALE 2 PUFFS TWICE A DAY RINSE MOUTH WITH WATER AFTER EACH USE   vitamin B-12 250 MCG tablet Commonly known as: CYANOCOBALAMIN Take 250 mcg by mouth daily.        Follow-up Information     Cletis Athens, MD. Schedule an appointment as soon as possible for a visit in 1 week(s).   Specialties: Internal Medicine, Cardiology Contact information: [place] [address] [phone]         Vickie Epley, MD .   Specialties: Cardiology, Radiology Contact information: [address] [place] [address] [phone]                Discharge Exam: Danley Danker Weights   06/11/21 1110  Weight: 44.5 kg   General.  Malnourished lady, in no acute distress. Pulmonary.  Lungs clear bilaterally, normal respiratory effort. CV.  Regular rate and rhythm, no JVD, rub or murmur. Abdomen.  Soft, nontender, nondistended, BS positive. CNS.  Alert and oriented .  No focal neurologic deficit. Extremities.  No edema, no cyanosis, pulses intact and symmetrical. Psychiatry.  Judgment and insight appears normal.   Condition at discharge: stable  The results of significant diagnostics from this hospitalization (including imaging, microbiology, ancillary and laboratory) are listed below for reference.   Imaging Studies: CT  ABDOMEN PELVIS W CONTRAST  Result Date: 06/11/2021 CLINICAL DATA:  Abdominal pain.  Diarrhea. EXAM: CT ABDOMEN AND PELVIS WITH CONTRAST TECHNIQUE: Multidetector CT imaging of the abdomen and pelvis was performed using the standard protocol following bolus administration of intravenous contrast. RADIATION DOSE REDUCTION: This exam was performed according to the departmental dose-optimization program which includes automated exposure control, adjustment of the mA and/or kV according to patient size and/or use of iterative reconstruction technique. CONTRAST:  166mL OMNIPAQUE IOHEXOL 300 MG/ML  SOLN COMPARISON:  Multiple CT scans since May 26, 2014. The most recent comparison is April 09, 2020. FINDINGS: Lower chest: Mild dependent subsegmental atelectasis. The lower chest is otherwise unremarkable. Hepatobiliary: No liver masses identified. Portal vein is normal. Intrahepatic ductal dilatation is stable to improved. The patient is status post cholecystectomy. Pancreas: Unremarkable. No pancreatic ductal dilatation or surrounding inflammatory changes. Spleen: Normal in size without focal abnormality. Adrenals/Urinary  Tract: Adrenal glands are normal. A few hypodense renal cortical lesions are identified in both kidneys with the largest in the upper pole on the right, unchanged since April 09, 2020. No hydronephrosis. The ureters are unremarkable. The bladder is unremarkable. Stomach/Bowel: Normal nondistended stomach. Chronic generalized duodenal dilatation again identified without wall thickening. The amount of dilatation is mildly increased compared to previous studies. There is an air-fluid level within the dilated duodenum. Stable postsurgical changes from proximal small bowel resection with enteroenterostomy in the central abdomen. The remainder of the small bowel is normal in caliber. Mild fecal loading in the ascending colon. The remainder of the colon is unremarkable. Vascular/Lymphatic: The abdominal aorta is  normal in caliber with atherosclerotic change. 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 These findings are stable since June of 2016. Reproductive: Patient is status post hysterectomy. Again noted is a hyperdense lesion measuring 1.2 x 1.3 cm today versus 1.5 x 1.3 cm previously. No other abnormalities. Other: No abdominal wall hernia or abnormality. No abdominopelvic ascites. Musculoskeletal: No acute or significant osseous findings. IMPRESSION: 1. Chronic generalized duodenal dilatation remains without wall thickening. The amount of dilatation has mildly increased compared to previous studies and there is an air-fluid level in this region. Ileus or partial obstruction are not excluded in this region on the study. 2. 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 Both reconstitute more distally. This finding is stable since June of 2016 and nonacute. Recommend clinical correlation for signs of chronic mesenteric ischemia. If the patient is having such symptoms, the patient could be referred to vascular surgery or interventional radiology for further assessment. 3. There is a 1.2 x 1.3 cm high attenuation mass in the left adnexa which is unchanged since March of 2022. Further evaluation with pelvic ultrasound as an outpatient may better evaluate this region. 4. Indeterminate low-attenuation renal cortical lesions with the largest measuring up to 1.5 cm in the upper right kidney. These findings are stable since June of 2021. A renal mass protocol MRI is recommended for further evaluation as an outpatient. 5. No other acute abnormalities. Electronically Signed   By: Dorise Bullion III M.D.   On: 06/11/2021 15:35    Microbiology: Results for orders placed or performed during the hospital encounter of 07/03/19  C Difficile Quick Screen w PCR reflex     Status: None   Collection Time: 07/02/19 10:38 PM   Specimen:  STOOL  Result Value Ref Range Status   C Diff antigen NEGATIVE NEGATIVE Final   C Diff toxin NEGATIVE NEGATIVE Final   C Diff interpretation No C. difficile detected.  Final    Comment: Performed at [name], [address] [place], [place] [address]    Labs: CBC: Recent Labs  Lab 06/11/21 1117 06/11/21 1808 06/12/21 0425  WBC 7.8 8.1 8.1  HGB 12.3 12.6 10.5*  HCT 37.7 38.9 32.8*  MCV 101.1* 101.6* 101.2*  PLT 318 314 128   Basic Metabolic Panel: Recent Labs  Lab 06/11/21 1117 06/11/21 1808 06/12/21 0425  NA 135  --  141  K 3.8  --  3.9  CL 106  --  111  CO2 22  --  25  GLUCOSE 103*  --  84  BUN 15  --  10  CREATININE 0.75 0.68 0.50  CALCIUM 8.5*  --  8.3*  MG  --   --  2.0  PHOS  --   --  2.7   Liver Function Tests: Recent Labs  Lab 06/11/21 1117 06/12/21 0425  AST 14* 11*  ALT 14 11  ALKPHOS 60 56  BILITOT 0.5 0.5  PROT 6.7 5.9*  ALBUMIN 3.4* 2.9*   CBG: No results for input(s): GLUCAP in the last 168 hours.  Discharge time spent: greater than 30 minutes.  This record has been created using Systems analyst. Errors have been sought and corrected,but may not always be located. Such creation errors do not reflect on the standard of care.   Signed: Lorella Nimrod, MD [facility] 06/13/2021

## 2021-06-17 ENCOUNTER — Other Ambulatory Visit: Payer: Self-pay | Admitting: Internal Medicine

## 2021-06-22 ENCOUNTER — Other Ambulatory Visit: Payer: Self-pay | Admitting: Internal Medicine

## 2021-06-22 ENCOUNTER — Other Ambulatory Visit: Payer: Self-pay

## 2021-06-28 ENCOUNTER — Ambulatory Visit: Payer: Medicare Other | Admitting: Internal Medicine

## 2021-06-29 ENCOUNTER — Encounter: Payer: Self-pay | Admitting: Internal Medicine

## 2021-06-29 ENCOUNTER — Ambulatory Visit (INDEPENDENT_AMBULATORY_CARE_PROVIDER_SITE_OTHER): Payer: Medicare Other | Admitting: Internal Medicine

## 2021-06-29 VITALS — BP 151/72 | HR 78 | Ht 66.0 in | Wt 85.3 lb

## 2021-06-29 DIAGNOSIS — I10 Essential (primary) hypertension: Secondary | ICD-10-CM | POA: Diagnosis not present

## 2021-06-29 DIAGNOSIS — K559 Vascular disorder of intestine, unspecified: Secondary | ICD-10-CM | POA: Diagnosis not present

## 2021-06-29 DIAGNOSIS — I429 Cardiomyopathy, unspecified: Secondary | ICD-10-CM

## 2021-06-29 DIAGNOSIS — L600 Ingrowing nail: Secondary | ICD-10-CM | POA: Diagnosis not present

## 2021-06-29 DIAGNOSIS — R627 Adult failure to thrive: Secondary | ICD-10-CM

## 2021-06-29 DIAGNOSIS — J410 Simple chronic bronchitis: Secondary | ICD-10-CM | POA: Diagnosis not present

## 2021-06-29 DIAGNOSIS — Z716 Tobacco abuse counseling: Secondary | ICD-10-CM

## 2021-06-29 MED ORDER — ALBUTEROL SULFATE (2.5 MG/3ML) 0.083% IN NEBU: 2.5000 mg | INHALATION_SOLUTION | Freq: Three times a day (TID) | RESPIRATORY_TRACT | 12 refills | Status: AC | PRN

## 2021-06-29 NOTE — Assessment & Plan Note (Signed)
Patient was advised to quit smoking 

## 2021-06-29 NOTE — Progress Notes (Signed)
Established Patient Office Visit  Subjective:  Patient ID: [name] [name], female    DOB: January 24, 1956  Age: 66 y.o. MRN: [id number]  CC:  Chief Complaint  Patient presents with   Hypertension    Hypertension   [name] [name] presents for sob  wants  inhaler, Patient is known to have hypertension and a history of lung cancer she just completed her chemotherapy.  She will go to [facility] on a regular basis.  She complains of shortness of breath on exertion She continues to smoke and I told her to stop smoking completely.  Recently patient was admitted into the hospital with small bowel obstruction.  She is doing better now and tolerating her food well, she also has a history of ischemic bowel disease  Past Medical History:  Diagnosis Date   Bowel obstruction ([place])    x1 month ago   Cancer ([place])    stomach cancer per pt and currently lung ca   COPD (chronic obstructive pulmonary disease) ([place])    Essential hypertension 12/02/2019   Lung cancer ([place])    Personal history of radiation therapy 2019-2020   lung ca    Past Surgical History:  Procedure Laterality Date   COLON SURGERY      Family History  Problem Relation Age of Onset   Lupus Sister    Heart failure Neg Hx    Diabetes Neg Hx     Social History   Socioeconomic History   Marital status: Widowed    Spouse name: Not on file   Number of children: 2   Years of education: 60   Highest education level: Not on file  Occupational History   Occupation: on disability  Tobacco Use   Smoking status: Every Day    Packs/day: 0.25    Types: Cigarettes   Smokeless tobacco: Never  Vaping Use   Vaping Use: Never used  Substance and Sexual Activity   Alcohol use: Yes    Comment: occas   Drug use: No   Sexual activity: Not Currently    Birth control/protection: Post-menopausal  Other Topics Concern   Not on file  Social History Narrative   Not on file   Social Determinants of Health   Financial Resource  Strain: High Risk   Difficulty of Paying Living Expenses: Hard  Food Insecurity: Food Insecurity Present   Worried About Running Out of Food in the Last Year: Sometimes true   Ran Out of Food in the Last Year: Sometimes true  Transportation Needs: Unmet Transportation Needs   Lack of Transportation (Medical): Yes   Lack of Transportation (Non-Medical): Yes  Physical Activity: Insufficiently Active   Days of Exercise per Week: 2 days   Minutes of Exercise per Session: 30 min  Stress: Stress Concern Present   Feeling of Stress : To some extent  Social Connections: Socially Isolated   Frequency of Communication with Friends and Family: Once a week   Frequency of Social Gatherings with Friends and Family: Once a week   Attends Religious Services: Never   Marine scientist or Organizations: No   Attends Archivist Meetings: Never   Marital Status: Widowed  Human resources officer Violence: Not At Risk   Fear of Current or Ex-Partner: No   Emotionally Abused: No   Physically Abused: No   Sexually Abused: No     Current Outpatient Medications:    acetaminophen (TYLENOL) 160 MG/5ML solution, Take 5 mLs (160 mg total) by mouth every  6 (six) hours as needed for mild pain., Disp: 120 mL, Rfl: 6   atorvastatin (LIPITOR) 40 MG tablet, Take 40 mg by mouth daily., Disp: , Rfl:    benzonatate (TESSALON) 100 MG capsule, TAKE 2 CAPSULES BY MOUTH 3 TIMES DAILY AS NEEDED FOR COUGH, Disp: 90 capsule, Rfl: 0   buPROPion (WELLBUTRIN SR) 100 MG 12 hr tablet, Take 1 tablet (100 mg total) by mouth 2 (two) times daily., Disp: 60 tablet, Rfl: 2   cholestyramine (QUESTRAN) 4 GM/DOSE powder, TAKE 1 SCOOPFUL(4 GRAMS) MIXED IN 4-8 OUNCES OF LIQUID AND DRINK, DAILY, Disp: 368.76 g, Rfl: 3   cycloSPORINE (RESTASIS) 0.05 % ophthalmic emulsion, Place 1 drop into both eyes every 12 (twelve) hours., Disp: , Rfl:    feeding supplement, ENSURE ENLIVE, (ENSURE ENLIVE) LIQD, Take 237 mLs by mouth 2 (two) times  daily between meals., Disp: 237 mL, Rfl: 12   fluticasone (FLONASE) 50 MCG/ACT nasal spray, USE 1 SPRAY INTO EACH NOSTRIL TWICE DAILY, Disp: 16 g, Rfl: 6   hydrochlorothiazide (MICROZIDE) 12.5 MG capsule, Take 12.5 mg by mouth daily., Disp: , Rfl:    hydrOXYzine (ATARAX/VISTARIL) 25 MG tablet, Take 25 mg by mouth 3 (three) times daily as needed., Disp: , Rfl:    lipase/protease/amylase (CREON) 12000-38000 units CPEP capsule, Take by mouth., Disp: , Rfl:    lisinopril (ZESTRIL) 40 MG tablet, Take 1 tablet (40 mg total) by mouth daily., Disp: 90 tablet, Rfl: 3   mirtazapine (REMERON) 15 MG tablet, Take 15 mg by mouth daily., Disp: , Rfl:    omeprazole (PRILOSEC) 20 MG capsule, TAKE 1 CAPSULE BY MOUTH TWICE DAILY, Disp: 180 capsule, Rfl: 0   ondansetron (ZOFRAN) 4 MG tablet, Take 1 tablet (4 mg total) by mouth every 6 (six) hours as needed for nausea., Disp: 20 tablet, Rfl: 0   pregabalin (LYRICA) 100 MG capsule, Take 100 mg by mouth 3 (three) times daily. , Disp: , Rfl:    QUEtiapine (SEROQUEL) 100 MG tablet, Take 100 mg by mouth at bedtime., Disp: , Rfl:    sertraline (ZOLOFT) 50 MG tablet, TAKE 1 TABLET BY MOUTH TWICE DAILY, Disp: 60 tablet, Rfl: 1   vitamin B-12 (CYANOCOBALAMIN) 250 MCG tablet, Take 250 mcg by mouth daily., Disp: , Rfl:    albuterol (PROVENTIL) (2.5 MG/3ML) 0.083% nebulizer solution, Take 3 mLs (2.5 mg total) by nebulization 3 (three) times daily as needed for wheezing., Disp: 75 mL, Rfl: 12   Allergies  Allergen Reactions   Aspirin Hives and Itching    ROS Review of Systems  Constitutional: Negative.   HENT: Negative.    Eyes: Negative.   Respiratory: Negative.    Cardiovascular: Negative.   Gastrointestinal: Negative.   Endocrine: Negative.   Genitourinary: Negative.   Musculoskeletal: Negative.   Skin: Negative.   Allergic/Immunologic: Negative.   Neurological: Negative.   Hematological: Negative.   Psychiatric/Behavioral: Negative.    All other systems  reviewed and are negative.    Objective:    Physical Exam Vitals reviewed.  Constitutional:      Appearance: Normal appearance.  HENT:     Mouth/Throat:     Mouth: Mucous membranes are moist.  Eyes:     Pupils: Pupils are equal, round, and reactive to light.  Neck:     Vascular: No carotid bruit.  Cardiovascular:     Rate and Rhythm: Normal rate and regular rhythm.     Pulses: Normal pulses.     Heart sounds: Normal heart sounds.  Pulmonary:     Effort: Pulmonary effort is normal.     Breath sounds: Normal breath sounds.  Abdominal:     General: Bowel sounds are normal.     Palpations: Abdomen is soft. There is no hepatomegaly, splenomegaly or mass.     Tenderness: There is no abdominal tenderness.     Hernia: No hernia is present.  Musculoskeletal:        General: No tenderness.     Cervical back: Neck supple.     Right lower leg: No edema.     Left lower leg: No edema.  Skin:    Findings: No rash.  Neurological:     Mental Status: She is alert and oriented to person, place, and time.     Motor: No weakness.  Psychiatric:        Mood and Affect: Mood and affect normal.        Behavior: Behavior normal.    BP (!) 151/72   Pulse 78   Ht 5\' 6"  (1.676 m)   Wt 85 lb 4.8 oz (38.7 kg)   BMI 13.77 kg/m  Wt Readings from Last 3 Encounters:  06/29/21 85 lb 4.8 oz (38.7 kg)  06/11/21 98 lb (44.5 kg)  04/06/21 91 lb (41.3 kg)     Health Maintenance Due  Topic Date Due   Hepatitis C Screening  Never done   Zoster Vaccines- Shingrix (1 of 2) Never done   PAP SMEAR-Modifier  Never done   MAMMOGRAM  05/12/2021    There are no preventive care reminders to display for this patient.  Lab Results  Component Value Date   TSH 0.40 07/07/2020   Lab Results  Component Value Date   WBC 8.1 06/12/2021   HGB 10.5 (L) 06/12/2021   HCT 32.8 (L) 06/12/2021   MCV 101.2 (H) 06/12/2021   PLT 293 06/12/2021   Lab Results  Component Value Date   NA 141 06/12/2021   K  3.9 06/12/2021   CO2 25 06/12/2021   GLUCOSE 84 06/12/2021   BUN 10 06/12/2021   CREATININE 0.50 06/12/2021   BILITOT 0.5 06/12/2021   ALKPHOS 56 06/12/2021   AST 11 (L) 06/12/2021   ALT 11 06/12/2021   PROT 5.9 (L) 06/12/2021   ALBUMIN 2.9 (L) 06/12/2021   CALCIUM 8.3 (L) 06/12/2021   ANIONGAP 5 06/12/2021   No results found for: CHOL No results found for: HDL No results found for: [facility] Lab Results  Component Value Date   TRIG 107 05/30/2014   No results found for: CHOLHDL No results found for: HGBA1C    Assessment & Plan:   Problem List Items Addressed This Visit       Cardiovascular and Mediastinum   Ischemic bowel disease ([place])    Patient should follow-up with vascular specialist       Cardiomyopathy ([place])    Stable at the present time       Essential hypertension    Blood pressure was found to be elevated on today's visit         Respiratory   COPD (chronic obstructive pulmonary disease) ([place]) (Chronic)    Patient was advised to quit smoking       Relevant Medications   albuterol (PROVENTIL) (2.5 MG/3ML) 0.083% nebulizer solution     Other   Tobacco abuse counseling    - I instructed the patient to stop smoking and provided them with smoking cessation materials.  - I informed the patient that smoking  puts them at increased risk for cancer, COPD, hypertension, and more.  - Informed the patient to seek help if they begin to have trouble breathing, develop chest pain, start to cough up blood, feel faint, or pass out.       Adult failure to thrive    Nutritionist to follow-up the patient       Other Visit Diagnoses     Ingrown nail    -  Primary   Relevant Orders   Ambulatory referral to Podiatry       Meds ordered this encounter  Medications   albuterol (PROVENTIL) (2.5 MG/3ML) 0.083% nebulizer solution    Sig: Take 3 mLs (2.5 mg total) by nebulization 3 (three) times daily as needed for wheezing.    Dispense:  75 mL    Refill:   12    Follow-up: No follow-ups on file.    Cletis Athens, MD

## 2021-06-29 NOTE — Assessment & Plan Note (Signed)
Blood pressure was found to be elevated on today's visit

## 2021-06-29 NOTE — Assessment & Plan Note (Signed)
Stable at the present time. 

## 2021-06-29 NOTE — Assessment & Plan Note (Signed)
-   I instructed the patient to stop smoking and provided them with smoking cessation materials.  - I informed the patient that smoking puts them at increased risk for cancer, COPD, hypertension, and more.  - Informed the patient to seek help if they begin to have trouble breathing, develop chest pain, start to cough up blood, feel faint, or pass out.  

## 2021-06-29 NOTE — Assessment & Plan Note (Signed)
Patient should follow-up with vascular specialist

## 2021-06-29 NOTE — Assessment & Plan Note (Signed)
Nutritionist to follow-up the patient

## 2021-07-13 ENCOUNTER — Ambulatory Visit: Payer: Medicare Other | Admitting: Podiatry

## 2021-07-31 ENCOUNTER — Other Ambulatory Visit: Payer: Self-pay | Admitting: Internal Medicine

## 2021-07-31 DIAGNOSIS — J301 Allergic rhinitis due to pollen: Secondary | ICD-10-CM

## 2021-08-02 ENCOUNTER — Other Ambulatory Visit: Payer: Self-pay | Admitting: Internal Medicine

## 2021-08-03 ENCOUNTER — Other Ambulatory Visit: Payer: Self-pay

## 2021-08-03 ENCOUNTER — Emergency Department
Admission: EM | Admit: 2021-08-03 | Discharge: 2021-08-04 | Disposition: A | Discharge status: Home / Self Care | Payer: Medicare Other | Attending: Emergency Medicine | Admitting: Emergency Medicine

## 2021-08-03 ENCOUNTER — Emergency Department: Payer: Medicare Other

## 2021-08-03 DIAGNOSIS — R197 Diarrhea, unspecified: Secondary | ICD-10-CM

## 2021-08-03 DIAGNOSIS — Z85118 Personal history of other malignant neoplasm of bronchus and lung: Secondary | ICD-10-CM | POA: Insufficient documentation

## 2021-08-03 DIAGNOSIS — R112 Nausea with vomiting, unspecified: Secondary | ICD-10-CM | POA: Diagnosis not present

## 2021-08-03 DIAGNOSIS — N3000 Acute cystitis without hematuria: Secondary | ICD-10-CM | POA: Diagnosis not present

## 2021-08-03 DIAGNOSIS — R509 Fever, unspecified: Secondary | ICD-10-CM | POA: Diagnosis not present

## 2021-08-03 DIAGNOSIS — J449 Chronic obstructive pulmonary disease, unspecified: Secondary | ICD-10-CM | POA: Insufficient documentation

## 2021-08-03 DIAGNOSIS — I10 Essential (primary) hypertension: Secondary | ICD-10-CM | POA: Insufficient documentation

## 2021-08-03 DIAGNOSIS — R1084 Generalized abdominal pain: Secondary | ICD-10-CM | POA: Diagnosis not present

## 2021-08-03 DIAGNOSIS — R109 Unspecified abdominal pain: Secondary | ICD-10-CM | POA: Diagnosis not present

## 2021-08-03 DIAGNOSIS — R6889 Other general symptoms and signs: Secondary | ICD-10-CM | POA: Diagnosis not present

## 2021-08-03 DIAGNOSIS — I700 Atherosclerosis of aorta: Secondary | ICD-10-CM | POA: Diagnosis not present

## 2021-08-03 DIAGNOSIS — R110 Nausea: Secondary | ICD-10-CM | POA: Diagnosis not present

## 2021-08-03 DIAGNOSIS — K529 Noninfective gastroenteritis and colitis, unspecified: Secondary | ICD-10-CM | POA: Diagnosis not present

## 2021-08-03 DIAGNOSIS — R1111 Vomiting without nausea: Secondary | ICD-10-CM | POA: Diagnosis not present

## 2021-08-03 LAB — CBC WITH DIFFERENTIAL/PLATELET
Abs Immature Granulocytes: 0.05 10*3/uL (ref 0.00–0.07)
Basophils Absolute: 0 10*3/uL (ref 0.0–0.1)
Basophils Relative: 0 %
Eosinophils Absolute: 0 10*3/uL (ref 0.0–0.5)
Eosinophils Relative: 0 %
HCT: 43.4 % (ref 36.0–46.0)
Hemoglobin: 14.1 g/dL (ref 12.0–15.0)
Immature Granulocytes: 0 %
Lymphocytes Relative: 7 %
Lymphs Abs: 1 10*3/uL (ref 0.7–4.0)
MCH: 33.5 pg (ref 26.0–34.0)
MCHC: 32.5 g/dL (ref 30.0–36.0)
MCV: 103.1 fL — ABNORMAL HIGH (ref 80.0–100.0)
Monocytes Absolute: 0.9 10*3/uL (ref 0.1–1.0)
Monocytes Relative: 7 %
Neutro Abs: 11.9 10*3/uL — ABNORMAL HIGH (ref 1.7–7.7)
Neutrophils Relative %: 86 %
Platelets: 344 10*3/uL (ref 150–400)
RBC: 4.21 MIL/uL (ref 3.87–5.11)
RDW: 13.2 % (ref 11.5–15.5)
WBC: 14 10*3/uL — ABNORMAL HIGH (ref 4.0–10.5)
nRBC: 0 % (ref 0.0–0.2)

## 2021-08-03 LAB — COMPREHENSIVE METABOLIC PANEL
ALT: 18 U/L (ref 0–44)
AST: 18 U/L (ref 15–41)
Albumin: 4.1 g/dL (ref 3.5–5.0)
Alkaline Phosphatase: 68 U/L (ref 38–126)
Anion gap: 8 (ref 5–15)
BUN: 14 mg/dL (ref 8–23)
CO2: 25 mmol/L (ref 22–32)
Calcium: 8.9 mg/dL (ref 8.9–10.3)
Chloride: 105 mmol/L (ref 98–111)
Creatinine, Ser: 0.47 mg/dL (ref 0.44–1.00)
GFR, Estimated: >60 mL/min (ref >60)
Glucose, Bld: 114 mg/dL — ABNORMAL HIGH (ref 70–99)
Potassium: 4.4 mmol/L (ref 3.5–5.1)
Sodium: 138 mmol/L (ref 135–145)
Total Bilirubin: 0.7 mg/dL (ref 0.3–1.2)
Total Protein: 7 g/dL (ref 6.5–8.1)

## 2021-08-03 LAB — URINALYSIS, COMPLETE (UACMP) WITH MICROSCOPIC
Bilirubin Urine: NEGATIVE
Glucose, UA: NEGATIVE mg/dL
Hgb urine dipstick: NEGATIVE
Ketones, ur: NEGATIVE mg/dL
Nitrite: NEGATIVE
Protein, ur: NEGATIVE mg/dL
Specific Gravity, Urine: 1.024 (ref 1.005–1.030)
pH: 5 (ref 5.0–8.0)

## 2021-08-03 LAB — C DIFFICILE QUICK SCREEN W PCR REFLEX
C Diff antigen: NEGATIVE
C Diff interpretation: NOT DETECTED
C Diff toxin: NEGATIVE

## 2021-08-03 LAB — LIPASE, BLOOD: Lipase: 29 U/L (ref 11–51)

## 2021-08-03 MED ORDER — ONDANSETRON HCL 4 MG/2ML IJ SOLN
4.0000 mg | Freq: Once | INTRAMUSCULAR | Status: AC
  Administered 2021-08-03: 4 mg via INTRAVENOUS
  Filled 2021-08-03: qty 2

## 2021-08-03 MED ORDER — PROCHLORPERAZINE EDISYLATE 10 MG/2ML IJ SOLN
10.0000 mg | Freq: Once | INTRAMUSCULAR | Status: AC
  Administered 2021-08-03: 10 mg via INTRAVENOUS
  Filled 2021-08-03: qty 2

## 2021-08-03 MED ORDER — DICYCLOMINE HCL 10 MG PO CAPS: 10.0000 mg | ORAL_CAPSULE | Freq: Three times a day (TID) | ORAL | 0 refills | Status: DC

## 2021-08-03 MED ORDER — LACTATED RINGERS IV BOLUS
1000.0000 mL | Freq: Once | INTRAVENOUS | Status: AC
  Administered 2021-08-03: 1000 mL via INTRAVENOUS

## 2021-08-03 MED ORDER — IOHEXOL 300 MG/ML  SOLN
75.0000 mL | Freq: Once | INTRAMUSCULAR | Status: AC | PRN
  Administered 2021-08-03: 75 mL via INTRAVENOUS

## 2021-08-03 MED ORDER — ONDANSETRON HCL 4 MG PO TABS: 4.0000 mg | ORAL_TABLET | Freq: Three times a day (TID) | ORAL | 0 refills | Status: AC | PRN

## 2021-08-03 MED ORDER — CEFTRIAXONE SODIUM 1 G IJ SOLR
1.0000 g | Freq: Once | INTRAMUSCULAR | Status: AC
  Administered 2021-08-03: 1 g via INTRAVENOUS
  Filled 2021-08-03: qty 10

## 2021-08-03 MED ORDER — DICYCLOMINE HCL 10 MG PO CAPS
10.0000 mg | ORAL_CAPSULE | Freq: Once | ORAL | Status: AC
Start: 2021-08-03 — End: 2021-08-03
  Administered 2021-08-03: 10 mg via ORAL
  Filled 2021-08-03: qty 1

## 2021-08-03 MED ORDER — MORPHINE SULFATE (PF) 4 MG/ML IV SOLN
4.0000 mg | Freq: Once | INTRAVENOUS | Status: AC
Start: 2021-08-03 — End: 2021-08-03
  Administered 2021-08-03: 4 mg via INTRAVENOUS
  Filled 2021-08-03: qty 1

## 2021-08-03 MED ORDER — CEFADROXIL 500 MG PO CAPS: 500.0000 mg | ORAL_CAPSULE | Freq: Two times a day (BID) | ORAL | 0 refills | Status: AC

## 2021-08-03 MED ORDER — ACETAMINOPHEN 500 MG PO TABS
1000.0000 mg | ORAL_TABLET | Freq: Once | ORAL | Status: AC
  Administered 2021-08-03: 1000 mg via ORAL
  Filled 2021-08-03: qty 2

## 2021-08-03 NOTE — ED Triage Notes (Signed)
Pt presents to ED with c/o of N/V/D since today at 1230. Pt not febrile at this time.

## 2021-08-03 NOTE — ED Notes (Signed)
First nurse note- pt brought in via ems from home with n/v/d for 1 day.temp 100.4, p 100, bp 129/88, fsbs 129 per ems.  Pt alert, in lobby in a wheelchair.

## 2021-08-03 NOTE — ED Provider Notes (Signed)
[facility] Provider Note    Event Date/Time   First MD Initiated Contact with Patient 08/03/21 1954     (approximate)   History   Nausea and Emesis   HPI  [name] [name] is a 66 y.o. female past medical history of COPD and ongoing tobacco abuse, colon cancer, HTN, HDL, depression, and recent admission discharged 5/21 for management of a partial bowel obstruction managed conservatively who presents for evaluation of cute onset of generalized abdominal pain associate with nonbloody nonbilious vomiting and nonbloody diarrhea starting today.  Denies any change in her chronic cough, chest pain, shortness of breath, headache, earache, sore throat, urinary symptoms or extremity pain.  No rashes.  She denies any EtOH use or illicit drug use.  She reportedly had a temperature of 100.4 earlier today.   Past Surgical History:  Procedure Laterality Date   COLON SURGERY         Past Medical History:  Diagnosis Date   Bowel obstruction ([place])    x1 month ago   Cancer ([place])    stomach cancer per pt and currently lung ca   COPD (chronic obstructive pulmonary disease) ([place])    Essential hypertension 12/02/2019   Lung cancer ([place])    Personal history of radiation therapy 2019-2020   lung ca     Physical Exam  Triage Vital Signs: ED Triage Vitals  Enc Vitals Group     BP 08/03/21 1819 (!) 128/94     Pulse Rate 08/03/21 1819 94     Resp 08/03/21 1819 20     Temp 08/03/21 1819 98.6 F (37 C)     Temp Source 08/03/21 1819 Oral     SpO2 08/03/21 1819 98 %     Weight 08/03/21 1821 98 lb (44.5 kg)     Height 08/03/21 1821 5\' 6"  (1.676 m)     Head Circumference --      Peak Flow --      Pain Score 08/03/21 1821 10     Pain Loc --      Pain Edu? --      Excl. in [place]? --     Most recent vital signs: Vitals:   08/03/21 2030 08/03/21 2300  BP: (!) 150/65 (!) 125/55  Pulse: 75 (!) 56  Resp: 17 16  Temp:    SpO2: 98% 93%    General: Awake, peers  uncomfortable. CV:  Good peripheral perfusion.  2+ radial pulses.  No significant murmur. Resp:  Normal effort.  Clear bilaterally. Abd:  No distention.  Soft but tender throughout.  Voluntary guarding. Other:  No CVA tenderness.  Dry mucous membranes   ED Results / Procedures / Treatments  Labs (all labs ordered are listed, but only abnormal results are displayed) Labs Reviewed  CBC WITH DIFFERENTIAL/PLATELET - Abnormal; Notable for the following components:      Result Value   WBC 14.0 (*)    MCV 103.1 (*)    Neutro Abs 11.9 (*)    All other components within normal limits  COMPREHENSIVE METABOLIC PANEL - Abnormal; Notable for the following components:   Glucose, Bld 114 (*)    All other components within normal limits  URINALYSIS, COMPLETE (UACMP) WITH MICROSCOPIC - Abnormal; Notable for the following components:   Color, Urine YELLOW (*)    APPearance HAZY (*)    Leukocytes,Ua LARGE (*)    Bacteria, UA RARE (*)    All other components within normal limits  C DIFFICILE  QUICK SCREEN W PCR REFLEX    GASTROINTESTINAL PANEL BY PCR, STOOL (REPLACES STOOL CULTURE)  URINE CULTURE  LIPASE, BLOOD     EKG  ECG shows sinus rhythm with a ventricular of 74, normal axis, unremarkable intervals without clear evidence of acute ischemia or significant arrhythmia.   RADIOLOGY  CT abdomen pelvis on my interpretation without evidence of an SBO, diverticulitis, perforation, kidney stone or other clear acute process.  I reviewed radiology interpretation and agree with the findings of fluid-filled small loops of bowel and gaseous distention reflecting possible ileus or diarrheal illness but no evidence of an SBO as well as aortic atherosclerosis.    PROCEDURES:  Critical Care performed: No  Procedures    MEDICATIONS ORDERED IN ED: Medications  lactated ringers bolus 1,000 mL (0 mLs Intravenous Stopped 08/03/21 2254)  ondansetron (ZOFRAN) injection 4 mg (4 mg Intravenous Given 08/03/21  2127)  morphine (PF) 4 MG/ML injection 4 mg (4 mg Intravenous Given 08/03/21 2127)  iohexol (OMNIPAQUE) 300 MG/ML solution 75 mL (75 mLs Intravenous Contrast Given 08/03/21 2136)  cefTRIAXone (ROCEPHIN) 1 g in sodium chloride 0.9 % 100 mL IVPB (0 g Intravenous Stopped 08/03/21 2259)  acetaminophen (TYLENOL) tablet 1,000 mg (1,000 mg Oral Given 08/03/21 2235)  dicyclomine (BENTYL) capsule 10 mg (10 mg Oral Given 08/03/21 2236)  ondansetron (ZOFRAN) injection 4 mg (4 mg Intravenous Given 08/03/21 2237)  prochlorperazine (COMPAZINE) injection 10 mg (10 mg Intravenous Given 08/03/21 2314)     IMPRESSION / MDM / ASSESSMENT AND PLAN / ED COURSE  I reviewed the triage vital signs and the nursing notes. Patient's presentation is most consistent with acute presentation with potential threat to life or bodily function.                               Differential diagnosis includes, but is not limited to recurrent SBO, colitis, pancreatitis, cholecystitis, cystitis and kidney stone.  ECG shows sinus rhythm with a ventricular of 74, normal axis, unremarkable intervals without clear evidence of acute ischemia or significant arrhythmia.  CMP shows no significant electrolyte or metabolic derangements including no evidence of acute hepatitis or cholestatic process.  CBC with WBC count of 14, hemoglobin of 14.1 and normal platelets.  Lipase is WNL and not suggestive of pancreatitis.  Urine shows large leukocyte esterase and 11-20 WBCs with some bacteria noted concerning for possibly clinically significant cystitis.  We will send urine culture.  CT screen is negative.  CT abdomen pelvis on my interpretation without evidence of an SBO, diverticulitis, perforation, kidney stone or other clear acute process.  I reviewed radiology interpretation and agree with the findings of fluid-filled small loops of bowel and gaseous distention reflecting possible ileus or diarrheal illness but no evidence of an SBO as well as aortic  atherosclerosis.   Patient is still feeling nauseous after 8 mg of Zofran.  We will give some Compazine.  Also give a dose of Rocephin started on treatment for UTI.  Care patient signed over to assuming provider at approximately 2300 with plan to reassess patient and if she is feeling better and able tolerate p.o. she can likely be safely discharged home with very low suspicion for sepsis or other immediate life-threatening process.  Otherwise she will require admission for continued IV hydration and intractable nausea and vomiting.     FINAL CLINICAL IMPRESSION(S) / ED DIAGNOSES   Final diagnoses:  Generalized abdominal pain  Nausea vomiting and  diarrhea  Gastroenteritis  Acute cystitis without hematuria     Rx / DC Orders   ED Discharge Orders          Ordered    ondansetron (ZOFRAN) 4 MG tablet  Every 8 hours PRN        08/03/21 2232    dicyclomine (BENTYL) 10 MG capsule  3 times daily before meals & bedtime        08/03/21 2232    cefadroxil (DURICEF) 500 MG capsule  2 times daily        08/03/21 2250             Note:  This document was prepared using Dragon voice recognition software and may include unintentional dictation errors.   Lucrezia Starch, MD 08/03/21 [phone]

## 2021-08-03 NOTE — ED Provider Triage Note (Signed)
Emergency Medicine Provider Triage Evaluation Note  [name] [name] , a 66 y.o. female  was evaluated in triage.  Patient has a history of COPD, and both lung and colon cancer.  Pt complains of nausea, vomiting and diarrhea that started at 12:30 PM today.  Patient complains epigastric abdominal pain that radiates to the periumbilical abdomen.  No chest pain, chest tightness or shortness of breath.  Patient denies fever.  Review of Systems  Positive: Patient has abdominal pain, nausea and vomiting. Negative: No chest pain or chest tightness.  Physical Exam  BP (!) 128/94 (BP Location: Left Arm)   Pulse 94   Temp 98.6 F (37 C) (Oral)   Resp 20   Ht 5\' 6"  (1.676 m)   Wt 44.5 kg   SpO2 98%   BMI 15.82 kg/m  Gen:   Awake, no distress   Resp:  Normal effort  MSK:   Moves extremities without difficulty  Other:    Medical Decision Making  Medically screening exam initiated at 6:24 PM.  Appropriate orders placed.  [name] [name] was informed that the remainder of the evaluation will be completed by another provider, this initial triage assessment does not replace that evaluation, and the importance of remaining in the ED until their evaluation is complete.     Vallarie Mare Allison, [place] 08/03/21 1826

## 2021-08-04 LAB — GASTROINTESTINAL PANEL BY PCR, STOOL (REPLACES STOOL CULTURE)

## 2021-08-04 LAB — URINE CULTURE: Culture: <10000 — AB

## 2021-08-04 NOTE — ED Notes (Signed)
Pt provided ginger ale for PO challenge

## 2021-08-04 NOTE — ED Provider Notes (Signed)
-----------------------------------------   1:42 AM on 08/04/2021 ----------------------------------------- Patient tolerating oral fluids without difficulty on reassessment, states she is feeling better.  She is appropriate for discharge home with prescription for nausea medication as well as antibiotics for UTI.  She was counseled to follow-up with her PCP and to return to the ED for new or worsening symptoms, patient agrees with plan.   Blake Divine, MD 08/04/21 [phone]

## 2021-08-31 ENCOUNTER — Other Ambulatory Visit: Payer: Self-pay | Admitting: Internal Medicine

## 2021-08-31 ENCOUNTER — Ambulatory Visit (INDEPENDENT_AMBULATORY_CARE_PROVIDER_SITE_OTHER): Payer: Medicare Other | Admitting: Internal Medicine

## 2021-08-31 ENCOUNTER — Encounter: Payer: Self-pay | Admitting: Internal Medicine

## 2021-08-31 VITALS — BP 140/85 | HR 80 | Ht 66.0 in | Wt 86.8 lb

## 2021-08-31 DIAGNOSIS — J410 Simple chronic bronchitis: Secondary | ICD-10-CM

## 2021-08-31 DIAGNOSIS — I429 Cardiomyopathy, unspecified: Secondary | ICD-10-CM

## 2021-08-31 DIAGNOSIS — Z716 Tobacco abuse counseling: Secondary | ICD-10-CM

## 2021-08-31 DIAGNOSIS — J301 Allergic rhinitis due to pollen: Secondary | ICD-10-CM | POA: Diagnosis not present

## 2021-08-31 DIAGNOSIS — I10 Essential (primary) hypertension: Secondary | ICD-10-CM | POA: Diagnosis not present

## 2021-08-31 DIAGNOSIS — K559 Vascular disorder of intestine, unspecified: Secondary | ICD-10-CM | POA: Diagnosis not present

## 2021-08-31 MED ORDER — FLUTICASONE PROPIONATE 50 MCG/ACT NA SUSP: NASAL | 6 refills | Status: DC

## 2021-08-31 MED ORDER — ALBUTEROL SULFATE HFA 108 (90 BASE) MCG/ACT IN AERS: 1.0000 | INHALATION_SPRAY | Freq: Two times a day (BID) | RESPIRATORY_TRACT | 6 refills | Status: DC | PRN

## 2021-08-31 MED ORDER — CYANOCOBALAMIN 250 MCG PO TABS: 250.0000 ug | ORAL_TABLET | Freq: Every day | ORAL | 6 refills | Status: DC

## 2021-08-31 NOTE — Progress Notes (Signed)
Established Patient Office Visit  Subjective:  Patient ID: [name] [name], female    DOB: 1955-12-20  Age: 66 y.o. MRN: [id number]  CC:  Chief Complaint  Patient presents with   Hypertension   Diarrhea    Patient complains of having diarrhea consistently for the past 2 weeks.     Hypertension  Diarrhea     [name] [name] presents for check up Past Medical History:  Diagnosis Date   Bowel obstruction ([place])    x1 month ago   Cancer [facility])    stomach cancer per pt and currently lung ca   COPD (chronic obstructive pulmonary disease) ([place])    Essential hypertension 12/02/2019   Lung cancer ([place])    Personal history of radiation therapy 2019-2020   lung ca    Past Surgical History:  Procedure Laterality Date   COLON SURGERY      Family History  Problem Relation Age of Onset   Lupus Sister    Heart failure Neg Hx    Diabetes Neg Hx     Social History   Socioeconomic History   Marital status: Widowed    Spouse name: Not on file   Number of children: 2   Years of education: 3   Highest education level: Not on file  Occupational History   Occupation: on disability  Tobacco Use   Smoking status: Every Day    Packs/day: 0.25    Types: Cigarettes   Smokeless tobacco: Never  Vaping Use   Vaping Use: Never used  Substance and Sexual Activity   Alcohol use: Yes    Comment: occas   Drug use: No   Sexual activity: Not Currently    Birth control/protection: Post-menopausal  Other Topics Concern   Not on file  Social History Narrative   Not on file   Social Determinants of Health   Financial Resource Strain: High Risk (01/11/2021)   Overall Financial Resource Strain (CARDIA)    Difficulty of Paying Living Expenses: Hard  Food Insecurity: Food Insecurity Present (01/11/2021)   Hunger Vital Sign    Worried About Running Out of Food in the Last Year: Sometimes true    Ran Out of Food in the Last Year: Sometimes true  Transportation Needs: Unmet  Transportation Needs (01/11/2021)   PRAPARE - Transportation    Lack of Transportation (Medical): Yes    Lack of Transportation (Non-Medical): Yes  Physical Activity: Insufficiently Active (01/11/2021)   Exercise Vital Sign    Days of Exercise per Week: 2 days    Minutes of Exercise per Session: 30 min  Stress: Stress Concern Present (01/11/2021)   [place]    Feeling of Stress : To some extent  Social Connections: Socially Isolated (01/11/2021)   Social Connection and Isolation Panel [NHANES]    Frequency of Communication with Friends and Family: Once a week    Frequency of Social Gatherings with Friends and Family: Once a week    Attends Religious Services: Never    Marine scientist or Organizations: No    Attends Archivist Meetings: Never    Marital Status: Widowed  Intimate Partner Violence: Not At Risk (01/11/2021)   Humiliation, Afraid, Rape, and Kick questionnaire    Fear of Current or Ex-Partner: No    Emotionally Abused: No    Physically Abused: No    Sexually Abused: No     Current Outpatient Medications:    acetaminophen (TYLENOL) 160  MG/5ML solution, Take 5 mLs (160 mg total) by mouth every 6 (six) hours as needed for mild pain., Disp: 120 mL, Rfl: 6   albuterol (PROVENTIL) (2.5 MG/3ML) 0.083% nebulizer solution, Take 3 mLs (2.5 mg total) by nebulization 3 (three) times daily as needed for wheezing., Disp: 75 mL, Rfl: 12   atorvastatin (LIPITOR) 40 MG tablet, Take 40 mg by mouth daily., Disp: , Rfl:    benzonatate (TESSALON) 100 MG capsule, TAKE 2 CAPSULES BY MOUTH 3 TIMES DAILY AS NEEDED FOR COUGH, Disp: 90 capsule, Rfl: 0   buPROPion (WELLBUTRIN SR) 100 MG 12 hr tablet, Take 1 tablet (100 mg total) by mouth 2 (two) times daily., Disp: 60 tablet, Rfl: 2   cholestyramine (QUESTRAN) 4 GM/DOSE powder, TAKE 1 SCOOPFUL(4 GRAMS) MIXED IN 4-8 OUNCES OF LIQUID AND DRINK, DAILY, Disp: 378 g,  Rfl: 1   cycloSPORINE (RESTASIS) 0.05 % ophthalmic emulsion, Place 1 drop into both eyes every 12 (twelve) hours., Disp: , Rfl:    feeding supplement, ENSURE ENLIVE, (ENSURE ENLIVE) LIQD, Take 237 mLs by mouth 2 (two) times daily between meals., Disp: 237 mL, Rfl: 12   hydrochlorothiazide (MICROZIDE) 12.5 MG capsule, Take 12.5 mg by mouth daily., Disp: , Rfl:    hydrOXYzine (ATARAX/VISTARIL) 25 MG tablet, Take 25 mg by mouth 3 (three) times daily as needed., Disp: , Rfl:    lipase/protease/amylase (CREON) 12000-38000 units CPEP capsule, Take by mouth., Disp: , Rfl:    lisinopril (ZESTRIL) 40 MG tablet, Take 1 tablet (40 mg total) by mouth daily., Disp: 90 tablet, Rfl: 3   mirtazapine (REMERON) 15 MG tablet, Take 15 mg by mouth daily., Disp: , Rfl:    omeprazole (PRILOSEC) 20 MG capsule, TAKE 1 CAPSULE BY MOUTH TWICE DAILY, Disp: 180 capsule, Rfl: 0   ondansetron (ZOFRAN) 4 MG tablet, Take 1 tablet (4 mg total) by mouth every 8 (eight) hours as needed for up to 10 doses for nausea or vomiting., Disp: 10 tablet, Rfl: 0   pantoprazole (PROTONIX) 40 MG tablet, TAKE 1 TABLET BY MOUTH ONCE DAILY, Disp: 30 tablet, Rfl: 1   pregabalin (LYRICA) 100 MG capsule, Take 100 mg by mouth 3 (three) times daily. , Disp: , Rfl:    QUEtiapine (SEROQUEL) 100 MG tablet, Take 100 mg by mouth at bedtime., Disp: , Rfl:    sertraline (ZOLOFT) 50 MG tablet, TAKE 1 TABLET BY MOUTH TWICE DAILY, Disp: 60 tablet, Rfl: 1   albuterol (VENTOLIN HFA) 108 (90 Base) MCG/ACT inhaler, Inhale 1 puff into the lungs 2 (two) times daily as needed., Disp: 18 g, Rfl: 6   cyanocobalamin (VITAMIN B12) 250 MCG tablet, Take 1 tablet (250 mcg total) by mouth daily., Disp: 30 tablet, Rfl: 6   dicyclomine (BENTYL) 10 MG capsule, Take 1 capsule (10 mg total) by mouth 4 (four) times daily -  before meals and at bedtime for 3 days., Disp: 12 capsule, Rfl: 0   fluticasone (FLONASE) 50 MCG/ACT nasal spray, 1 spray in each nostril TID, Disp: 16 g, Rfl: 6    Allergies  Allergen Reactions   Aspirin Hives and Itching    ROS Review of Systems  Constitutional: Negative.   HENT: Negative.    Eyes: Negative.   Respiratory: Negative.    Cardiovascular: Negative.   Gastrointestinal:  Positive for diarrhea.  Endocrine: Negative.   Genitourinary: Negative.   Musculoskeletal: Negative.   Skin: Negative.   Allergic/Immunologic: Negative.   Neurological: Negative.   Hematological: Negative.   Psychiatric/Behavioral: Negative.  All other systems reviewed and are negative.     Objective:    Physical Exam Vitals reviewed.  Constitutional:      Appearance: Normal appearance.  HENT:     Mouth/Throat:     Mouth: Mucous membranes are moist.  Eyes:     Pupils: Pupils are equal, round, and reactive to light.  Neck:     Vascular: No carotid bruit.  Cardiovascular:     Rate and Rhythm: Normal rate and regular rhythm.     Pulses: Normal pulses.     Heart sounds: Normal heart sounds.  Pulmonary:     Effort: Pulmonary effort is normal.     Breath sounds: Normal breath sounds.  Abdominal:     General: Bowel sounds are normal.     Palpations: Abdomen is soft. There is no hepatomegaly, splenomegaly or mass.     Tenderness: There is no abdominal tenderness.     Hernia: No hernia is present.  Musculoskeletal:        General: No tenderness.     Cervical back: Neck supple.     Right lower leg: No edema.     Left lower leg: No edema.  Skin:    Findings: No rash.  Neurological:     Mental Status: She is alert and oriented to person, place, and time.     Motor: No weakness.  Psychiatric:        Mood and Affect: Mood and affect normal.        Behavior: Behavior normal.     BP (!) 140/85   Pulse 80   Ht 5\' 6"  (1.676 m)   Wt 86 lb 12.8 oz (39.4 kg)   BMI 14.01 kg/m  Wt Readings from Last 3 Encounters:  08/31/21 86 lb 12.8 oz (39.4 kg)  08/03/21 98 lb (44.5 kg)  06/29/21 85 lb 4.8 oz (38.7 kg)     Health Maintenance Due   Topic Date Due   Hepatitis C Screening  Never done   Zoster Vaccines- Shingrix (1 of 2) Never done   PAP SMEAR-Modifier  Never done   MAMMOGRAM  05/12/2021   INFLUENZA VACCINE  08/24/2021    There are no preventive care reminders to display for this patient.  Lab Results  Component Value Date   TSH 0.40 07/07/2020   Lab Results  Component Value Date   WBC 14.0 (H) 08/03/2021   HGB 14.1 08/03/2021   HCT 43.4 08/03/2021   MCV 103.1 (H) 08/03/2021   PLT 344 08/03/2021   Lab Results  Component Value Date   NA 138 08/03/2021   K 4.4 08/03/2021   CO2 25 08/03/2021   GLUCOSE 114 (H) 08/03/2021   BUN 14 08/03/2021   CREATININE 0.47 08/03/2021   BILITOT 0.7 08/03/2021   ALKPHOS 68 08/03/2021   AST 18 08/03/2021   ALT 18 08/03/2021   PROT 7.0 08/03/2021   ALBUMIN 4.1 08/03/2021   CALCIUM 8.9 08/03/2021   ANIONGAP 8 08/03/2021   No results found for: "CHOL" No results found for: "HDL" No results found for: "LDLCALC" Lab Results  Component Value Date   TRIG 107 05/30/2014   No results found for: "CHOLHDL" No results found for: "HGBA1C"    Assessment & Plan:   Problem List Items Addressed This Visit       Cardiovascular and Mediastinum   Ischemic bowel disease ([place])    Abdominal pain no longer present      Cardiomyopathy ([place]) - Primary  Stable at the present time      Essential hypertension    Blood pressure stable        Respiratory   COPD (chronic obstructive pulmonary disease) ([facility]) (Chronic)    Chest examination does not reveal any rales or rhonchi      Relevant Medications   fluticasone (FLONASE) 50 MCG/ACT nasal spray   albuterol (VENTOLIN HFA) 108 (90 Base) MCG/ACT inhaler     Other   Tobacco abuse counseling    Patient was advised to quit smoking      Other Visit Diagnoses     Seasonal allergic rhinitis due to pollen       Relevant Medications   fluticasone (FLONASE) 50 MCG/ACT nasal spray       Meds ordered this encounter   Medications   fluticasone (FLONASE) 50 MCG/ACT nasal spray    Sig: 1 spray in each nostril TID    Dispense:  16 g    Refill:  6   cyanocobalamin (VITAMIN B12) 250 MCG tablet    Sig: Take 1 tablet (250 mcg total) by mouth daily.    Dispense:  30 tablet    Refill:  6   albuterol (VENTOLIN HFA) 108 (90 Base) MCG/ACT inhaler    Sig: Inhale 1 puff into the lungs 2 (two) times daily as needed.    Dispense:  18 g    Refill:  6    Follow-up: No follow-ups on file.    Cletis Athens, MD

## 2021-08-31 NOTE — Assessment & Plan Note (Signed)
Blood pressure stable ? ?

## 2021-08-31 NOTE — Assessment & Plan Note (Signed)
Stable at the present time. 

## 2021-08-31 NOTE — Assessment & Plan Note (Signed)
Patient was advised to quit smoking 

## 2021-08-31 NOTE — Assessment & Plan Note (Signed)
Abdominal pain no longer present

## 2021-08-31 NOTE — Assessment & Plan Note (Signed)
Chest examination does not reveal any rales or rhonchi 

## 2021-09-02 ENCOUNTER — Other Ambulatory Visit: Payer: Self-pay | Admitting: Internal Medicine

## 2021-10-02 ENCOUNTER — Other Ambulatory Visit: Payer: Self-pay | Admitting: *Deleted

## 2021-10-02 DIAGNOSIS — Z1231 Encounter for screening mammogram for malignant neoplasm of breast: Secondary | ICD-10-CM

## 2021-10-22 ENCOUNTER — Other Ambulatory Visit: Payer: Self-pay | Admitting: Internal Medicine

## 2021-11-16 DIAGNOSIS — R0789 Other chest pain: Secondary | ICD-10-CM | POA: Diagnosis not present

## 2021-11-16 DIAGNOSIS — Z923 Personal history of irradiation: Secondary | ICD-10-CM | POA: Diagnosis not present

## 2021-11-16 DIAGNOSIS — R519 Headache, unspecified: Secondary | ICD-10-CM | POA: Diagnosis not present

## 2021-11-16 DIAGNOSIS — C3412 Malignant neoplasm of upper lobe, left bronchus or lung: Secondary | ICD-10-CM | POA: Diagnosis not present

## 2021-11-16 DIAGNOSIS — R0602 Shortness of breath: Secondary | ICD-10-CM | POA: Diagnosis not present

## 2021-11-16 DIAGNOSIS — C3411 Malignant neoplasm of upper lobe, right bronchus or lung: Secondary | ICD-10-CM | POA: Diagnosis not present

## 2021-11-16 DIAGNOSIS — I6529 Occlusion and stenosis of unspecified carotid artery: Secondary | ICD-10-CM | POA: Diagnosis not present

## 2021-11-16 DIAGNOSIS — R2681 Unsteadiness on feet: Secondary | ICD-10-CM | POA: Diagnosis not present

## 2021-11-16 DIAGNOSIS — C3492 Malignant neoplasm of unspecified part of left bronchus or lung: Secondary | ICD-10-CM | POA: Diagnosis not present

## 2021-11-16 DIAGNOSIS — Z5941 Food insecurity: Secondary | ICD-10-CM | POA: Diagnosis not present

## 2021-11-16 DIAGNOSIS — G8929 Other chronic pain: Secondary | ICD-10-CM | POA: Diagnosis not present

## 2021-11-23 ENCOUNTER — Encounter: Payer: Self-pay | Admitting: Internal Medicine

## 2021-11-23 ENCOUNTER — Ambulatory Visit (INDEPENDENT_AMBULATORY_CARE_PROVIDER_SITE_OTHER): Payer: Medicare Other | Admitting: Internal Medicine

## 2021-11-23 ENCOUNTER — Encounter: Payer: Self-pay | Admitting: *Deleted

## 2021-11-23 VITALS — BP 118/85 | HR 86 | Ht 66.0 in | Wt 89.5 lb

## 2021-11-23 DIAGNOSIS — Z23 Encounter for immunization: Secondary | ICD-10-CM | POA: Diagnosis not present

## 2021-11-23 DIAGNOSIS — M12812 Other specific arthropathies, not elsewhere classified, left shoulder: Secondary | ICD-10-CM

## 2021-11-23 DIAGNOSIS — J410 Simple chronic bronchitis: Secondary | ICD-10-CM | POA: Diagnosis not present

## 2021-11-23 DIAGNOSIS — R627 Adult failure to thrive: Secondary | ICD-10-CM

## 2021-11-23 DIAGNOSIS — Z716 Tobacco abuse counseling: Secondary | ICD-10-CM

## 2021-11-23 DIAGNOSIS — I10 Essential (primary) hypertension: Secondary | ICD-10-CM | POA: Diagnosis not present

## 2021-11-23 NOTE — Assessment & Plan Note (Signed)
Refer to GI 

## 2021-11-23 NOTE — Assessment & Plan Note (Signed)
Chronic problem. 

## 2021-11-23 NOTE — Assessment & Plan Note (Signed)
-   I instructed the patient to stop smoking and provided them with smoking cessation materials.  - I informed the patient that smoking puts them at increased risk for cancer, COPD, hypertension, and more.  - Informed the patient to seek help if they begin to have trouble breathing, develop chest pain, start to cough up blood, feel faint, or pass out.  

## 2021-11-23 NOTE — Addendum Note (Signed)
Addended by: Lacretia Nicks L on: 11/23/2021 12:00 PM   Modules accepted: Orders

## 2021-11-23 NOTE — Assessment & Plan Note (Signed)
Patient was advised to quit smoking 

## 2021-11-23 NOTE — Progress Notes (Signed)
Established Patient Office Visit  Subjective:  Patient ID: [name] [name], female    DOB: Jul 27, 1955  Age: 66 y.o. MRN: [id number]  CC:  Chief Complaint  Patient presents with   Follow-up    HPI  [name] [name] presents for general check up  Past Medical History:  Diagnosis Date   Bowel obstruction ([place])    x1 month ago   Cancer [facility])    stomach cancer per pt and currently lung ca   COPD (chronic obstructive pulmonary disease) ([place])    Essential hypertension 12/02/2019   Lung cancer ([place])    Personal history of radiation therapy 2019-2020   lung ca    Past Surgical History:  Procedure Laterality Date   COLON SURGERY      Family History  Problem Relation Age of Onset   Lupus Sister    Heart failure Neg Hx    Diabetes Neg Hx     Social History   Socioeconomic History   Marital status: Widowed    Spouse name: Not on file   Number of children: 2   Years of education: 13   Highest education level: Not on file  Occupational History   Occupation: on disability  Tobacco Use   Smoking status: Every Day    Packs/day: 0.25    Types: Cigarettes   Smokeless tobacco: Never  Vaping Use   Vaping Use: Never used  Substance and Sexual Activity   Alcohol use: Yes    Comment: occas   Drug use: No   Sexual activity: Not Currently    Birth control/protection: Post-menopausal  Other Topics Concern   Not on file  Social History Narrative   Not on file   Social Determinants of Health   Financial Resource Strain: High Risk (01/11/2021)   Overall Financial Resource Strain (CARDIA)    Difficulty of Paying Living Expenses: Hard  Food Insecurity: Food Insecurity Present (01/11/2021)   Hunger Vital Sign    Worried About Running Out of Food in the Last Year: Sometimes true    Ran Out of Food in the Last Year: Sometimes true  Transportation Needs: Unmet Transportation Needs (01/11/2021)   PRAPARE - Transportation    Lack of Transportation (Medical): Yes    Lack of  Transportation (Non-Medical): Yes  Physical Activity: Insufficiently Active (01/11/2021)   Exercise Vital Sign    Days of Exercise per Week: 2 days    Minutes of Exercise per Session: 30 min  Stress: Stress Concern Present (01/11/2021)   [place]    Feeling of Stress : To some extent  Social Connections: Socially Isolated (01/11/2021)   Social Connection and Isolation Panel [NHANES]    Frequency of Communication with Friends and Family: Once a week    Frequency of Social Gatherings with Friends and Family: Once a week    Attends Religious Services: Never    Marine scientist or Organizations: No    Attends Archivist Meetings: Never    Marital Status: Widowed  Intimate Partner Violence: Not At Risk (01/11/2021)   Humiliation, Afraid, Rape, and Kick questionnaire    Fear of Current or Ex-Partner: No    Emotionally Abused: No    Physically Abused: No    Sexually Abused: No     Current Outpatient Medications:    acetaminophen (TYLENOL) 160 MG/5ML solution, Take 5 mLs (160 mg total) by mouth every 6 (six) hours as needed for mild pain., Disp: 120  mL, Rfl: 6   albuterol (PROVENTIL) (2.5 MG/3ML) 0.083% nebulizer solution, Take 3 mLs (2.5 mg total) by nebulization 3 (three) times daily as needed for wheezing., Disp: 75 mL, Rfl: 12   albuterol (VENTOLIN HFA) 108 (90 Base) MCG/ACT inhaler, Inhale 1 puff into the lungs 2 (two) times daily as needed., Disp: 18 g, Rfl: 6   atorvastatin (LIPITOR) 40 MG tablet, Take 40 mg by mouth daily., Disp: , Rfl:    benzonatate (TESSALON) 100 MG capsule, TAKE 2 CAPSULES BY MOUTH 3 TIMES DAILY AS NEEDED FOR COUGH, Disp: 90 capsule, Rfl: 0   buPROPion (WELLBUTRIN SR) 100 MG 12 hr tablet, Take 1 tablet (100 mg total) by mouth 2 (two) times daily., Disp: 60 tablet, Rfl: 2   cholestyramine (QUESTRAN) 4 GM/DOSE powder, TAKE 1 SCOOPFUL(4 GRAMS) MIXED IN 4-8 OUNCES OF LIQUID AND DRINK,  DAILY, Disp: 378 g, Rfl: 1   cyanocobalamin (VITAMIN B12) 250 MCG tablet, Take 1 tablet (250 mcg total) by mouth daily., Disp: 30 tablet, Rfl: 6   cycloSPORINE (RESTASIS) 0.05 % ophthalmic emulsion, Place 1 drop into both eyes every 12 (twelve) hours., Disp: , Rfl:    dicyclomine (BENTYL) 10 MG capsule, Take 1 capsule (10 mg total) by mouth 4 (four) times daily -  before meals and at bedtime for 3 days., Disp: 12 capsule, Rfl: 0   feeding supplement, ENSURE ENLIVE, (ENSURE ENLIVE) LIQD, Take 237 mLs by mouth 2 (two) times daily between meals., Disp: 237 mL, Rfl: 12   fluticasone (FLONASE) 50 MCG/ACT nasal spray, 1 spray in each nostril TID, Disp: 16 g, Rfl: 6   hydrochlorothiazide (MICROZIDE) 12.5 MG capsule, Take 12.5 mg by mouth daily., Disp: , Rfl:    hydrOXYzine (ATARAX/VISTARIL) 25 MG tablet, Take 25 mg by mouth 3 (three) times daily as needed., Disp: , Rfl:    lipase/protease/amylase (CREON) 12000-38000 units CPEP capsule, Take by mouth., Disp: , Rfl:    lisinopril (ZESTRIL) 40 MG tablet, Take 1 tablet (40 mg total) by mouth daily., Disp: 90 tablet, Rfl: 3   mirtazapine (REMERON) 15 MG tablet, Take 15 mg by mouth daily., Disp: , Rfl:    omeprazole (PRILOSEC) 20 MG capsule, TAKE 1 CAPSULE BY MOUTH TWICE DAILY, Disp: 180 capsule, Rfl: 0   ondansetron (ZOFRAN) 4 MG tablet, Take 1 tablet (4 mg total) by mouth every 8 (eight) hours as needed for up to 10 doses for nausea or vomiting., Disp: 10 tablet, Rfl: 0   pantoprazole (PROTONIX) 40 MG tablet, TAKE 1 TABLET BY MOUTH ONCE DAILY, Disp: 30 tablet, Rfl: 1   pregabalin (LYRICA) 100 MG capsule, Take 100 mg by mouth 3 (three) times daily. , Disp: , Rfl:    QUEtiapine (SEROQUEL) 100 MG tablet, Take 100 mg by mouth at bedtime., Disp: , Rfl:    sertraline (ZOLOFT) 50 MG tablet, TAKE 1 TABLET BY MOUTH TWICE DAILY, Disp: 60 tablet, Rfl: 1   SYMBICORT 160-4.5 MCG/ACT inhaler, INHALE 2 PUFFS TWICE A DAY RINSE MOUTH WITH WATER AFTER EACH USE, Disp: 10.2 g,  Rfl: 2   Vitamin D, Ergocalciferol, (DRISDOL) 1.25 MG (50000 UNIT) CAPS capsule, TAKE 1 CAPSULE BY MOUTH WEEKLY, Disp: 12 capsule, Rfl: 4   Allergies  Allergen Reactions   Aspirin Hives and Itching    ROS Review of Systems  Constitutional:  Positive for fatigue.  HENT: Negative.    Eyes: Negative.   Respiratory: Negative.  Negative for cough, choking, chest tightness, shortness of breath and wheezing.   Cardiovascular: Negative.  Negative for chest pain, palpitations and leg swelling.  Gastrointestinal: Negative.   Endocrine: Negative.   Genitourinary: Negative.   Musculoskeletal: Negative.   Skin: Negative.   Allergic/Immunologic: Negative.   Neurological: Negative.   Hematological: Negative.   Psychiatric/Behavioral: Negative.    All other systems reviewed and are negative.     Objective:    Physical Exam Neurological:     General: No focal deficit present.  Psychiatric:        Attention and Perception: She does not perceive auditory hallucinations.        Mood and Affect: Mood is depressed. Affect is labile and tearful.        Behavior: Behavior normal.        Thought Content: Thought content normal. Thought content does not include suicidal plan.        Cognition and Memory: Cognition normal.        Judgment: Judgment normal.     Ht 5\' 6"  (1.676 m)   BMI 14.01 kg/m  Wt Readings from Last 3 Encounters:  08/31/21 86 lb 12.8 oz (39.4 kg)  08/03/21 98 lb (44.5 kg)  06/29/21 85 lb 4.8 oz (38.7 kg)     Health Maintenance Due  Topic Date Due   Hepatitis C Screening  Never done   Zoster Vaccines- Shingrix (1 of 2) Never done   PAP SMEAR-Modifier  Never done   COVID-19 Vaccine (5 - Pfizer risk series) 12/24/2020   MAMMOGRAM  05/12/2021   INFLUENZA VACCINE  08/24/2021   Medicare Annual Wellness (AWV)  01/11/2022    There are no preventive care reminders to display for this patient.  Lab Results  Component Value Date   TSH 0.40 07/07/2020   Lab Results   Component Value Date   WBC 14.0 (H) 08/03/2021   HGB 14.1 08/03/2021   HCT 43.4 08/03/2021   MCV 103.1 (H) 08/03/2021   PLT 344 08/03/2021   Lab Results  Component Value Date   NA 138 08/03/2021   K 4.4 08/03/2021   CO2 25 08/03/2021   GLUCOSE 114 (H) 08/03/2021   BUN 14 08/03/2021   CREATININE 0.47 08/03/2021   BILITOT 0.7 08/03/2021   ALKPHOS 68 08/03/2021   AST 18 08/03/2021   ALT 18 08/03/2021   PROT 7.0 08/03/2021   ALBUMIN 4.1 08/03/2021   CALCIUM 8.9 08/03/2021   ANIONGAP 8 08/03/2021   No results found for: "CHOL" No results found for: "HDL" No results found for: "LDLCALC" Lab Results  Component Value Date   TRIG 107 05/30/2014   No results found for: "CHOLHDL" No results found for: "HGBA1C"    Assessment & Plan:   Problem List Items Addressed This Visit   None   No orders of the defined types were placed in this encounter.   Follow-up: No follow-ups on file.  Patient can handle her financial affairs  Cletis Athens, MD

## 2021-11-23 NOTE — Assessment & Plan Note (Signed)
Stable at the present time. 

## 2021-11-25 DIAGNOSIS — C3492 Malignant neoplasm of unspecified part of left bronchus or lung: Secondary | ICD-10-CM | POA: Diagnosis not present

## 2021-11-25 DIAGNOSIS — I700 Atherosclerosis of aorta: Secondary | ICD-10-CM | POA: Diagnosis not present

## 2021-11-25 DIAGNOSIS — R918 Other nonspecific abnormal finding of lung field: Secondary | ICD-10-CM | POA: Diagnosis not present

## 2021-11-25 DIAGNOSIS — Z923 Personal history of irradiation: Secondary | ICD-10-CM | POA: Diagnosis not present

## 2021-11-25 DIAGNOSIS — C3411 Malignant neoplasm of upper lobe, right bronchus or lung: Secondary | ICD-10-CM | POA: Diagnosis not present

## 2021-12-06 ENCOUNTER — Ambulatory Visit: Payer: Medicare Other | Admitting: Internal Medicine

## 2021-12-10 DIAGNOSIS — C3411 Malignant neoplasm of upper lobe, right bronchus or lung: Secondary | ICD-10-CM | POA: Diagnosis not present

## 2021-12-10 DIAGNOSIS — C3492 Malignant neoplasm of unspecified part of left bronchus or lung: Secondary | ICD-10-CM | POA: Diagnosis not present

## 2021-12-10 DIAGNOSIS — Z923 Personal history of irradiation: Secondary | ICD-10-CM | POA: Diagnosis not present

## 2021-12-20 ENCOUNTER — Ambulatory Visit: Payer: Medicare Other | Admitting: Internal Medicine

## 2021-12-23 DIAGNOSIS — Z923 Personal history of irradiation: Secondary | ICD-10-CM | POA: Diagnosis not present

## 2021-12-23 DIAGNOSIS — C3492 Malignant neoplasm of unspecified part of left bronchus or lung: Secondary | ICD-10-CM | POA: Diagnosis not present

## 2021-12-23 DIAGNOSIS — C3411 Malignant neoplasm of upper lobe, right bronchus or lung: Secondary | ICD-10-CM | POA: Diagnosis not present

## 2021-12-24 DIAGNOSIS — Z510 Encounter for antineoplastic radiation therapy: Secondary | ICD-10-CM | POA: Diagnosis not present

## 2021-12-24 DIAGNOSIS — C3411 Malignant neoplasm of upper lobe, right bronchus or lung: Secondary | ICD-10-CM | POA: Diagnosis not present

## 2021-12-28 DIAGNOSIS — C3411 Malignant neoplasm of upper lobe, right bronchus or lung: Secondary | ICD-10-CM | POA: Diagnosis not present

## 2021-12-28 DIAGNOSIS — Z510 Encounter for antineoplastic radiation therapy: Secondary | ICD-10-CM | POA: Diagnosis not present

## 2022-01-03 DIAGNOSIS — C3411 Malignant neoplasm of upper lobe, right bronchus or lung: Secondary | ICD-10-CM | POA: Diagnosis not present

## 2022-01-03 DIAGNOSIS — Z510 Encounter for antineoplastic radiation therapy: Secondary | ICD-10-CM | POA: Diagnosis not present

## 2022-01-10 ENCOUNTER — Telehealth: Payer: Self-pay

## 2022-01-10 NOTE — Telephone Encounter (Signed)
Patient is calling in asking for cough medication to be called in.

## 2022-01-12 ENCOUNTER — Other Ambulatory Visit: Payer: Self-pay | Admitting: Internal Medicine

## 2022-01-12 ENCOUNTER — Telehealth: Payer: Self-pay

## 2022-01-12 DIAGNOSIS — J301 Allergic rhinitis due to pollen: Secondary | ICD-10-CM

## 2022-01-12 DIAGNOSIS — R059 Cough, unspecified: Secondary | ICD-10-CM

## 2022-01-12 MED ORDER — AZITHROMYCIN 250 MG PO TABS: ORAL_TABLET | ORAL | 0 refills | Status: AC

## 2022-01-12 NOTE — Telephone Encounter (Signed)
LMOM that zpak has been called in.

## 2022-01-12 NOTE — Telephone Encounter (Signed)
Patient has a terrible cough and wants to know if Dr.Masoud will send in a prescription.

## 2022-01-12 NOTE — Telephone Encounter (Signed)
Related encounter 01/12/22, medication sent in.

## 2022-01-12 NOTE — Telephone Encounter (Signed)
Per Dr Lavera Guise zpak called in for anyone sick so they do not have to come in office sure to being immunocompromised.

## 2022-01-13 ENCOUNTER — Telehealth: Payer: Self-pay | Admitting: Internal Medicine

## 2022-01-13 NOTE — Telephone Encounter (Signed)
Pt called to request another prescription for cough medicine. Pt does not remember the name of the medicine and requests a return call to heer at [phone].  Last written: 06/22/21 TESSALON 100 MG   PT USES: [facility] [place] - [address] [place], [place] [address] Phone: [phone]  Fax: [phone]    Pt may be referring to a cough syrup. Please call for clarification.

## 2022-01-13 NOTE — Telephone Encounter (Signed)
Patient called yesterday for cough and was sent a zpak. She has been advised to take this and if no better she will need to be seen.

## 2022-01-14 DIAGNOSIS — Z510 Encounter for antineoplastic radiation therapy: Secondary | ICD-10-CM | POA: Diagnosis not present

## 2022-01-14 DIAGNOSIS — C3411 Malignant neoplasm of upper lobe, right bronchus or lung: Secondary | ICD-10-CM | POA: Diagnosis not present

## 2022-02-23 ENCOUNTER — Other Ambulatory Visit: Payer: Self-pay | Admitting: Internal Medicine

## 2022-03-01 ENCOUNTER — Ambulatory Visit: Payer: Medicare Other | Admitting: Internal Medicine

## 2022-03-28 ENCOUNTER — Emergency Department: Payer: 59

## 2022-03-28 ENCOUNTER — Emergency Department
Admission: EM | Admit: 2022-03-28 | Discharge: 2022-03-28 | Disposition: A | Discharge status: Home / Self Care | Payer: 59 | Attending: Emergency Medicine | Admitting: Emergency Medicine

## 2022-03-28 ENCOUNTER — Other Ambulatory Visit: Payer: Self-pay

## 2022-03-28 DIAGNOSIS — Z85118 Personal history of other malignant neoplasm of bronchus and lung: Secondary | ICD-10-CM | POA: Diagnosis not present

## 2022-03-28 DIAGNOSIS — R9431 Abnormal electrocardiogram [ECG] [EKG]: Secondary | ICD-10-CM | POA: Diagnosis not present

## 2022-03-28 DIAGNOSIS — I10 Essential (primary) hypertension: Secondary | ICD-10-CM | POA: Diagnosis not present

## 2022-03-28 DIAGNOSIS — R0789 Other chest pain: Secondary | ICD-10-CM | POA: Diagnosis not present

## 2022-03-28 DIAGNOSIS — J432 Centrilobular emphysema: Secondary | ICD-10-CM | POA: Diagnosis not present

## 2022-03-28 DIAGNOSIS — R079 Chest pain, unspecified: Secondary | ICD-10-CM

## 2022-03-28 DIAGNOSIS — R911 Solitary pulmonary nodule: Secondary | ICD-10-CM | POA: Diagnosis not present

## 2022-03-28 DIAGNOSIS — I499 Cardiac arrhythmia, unspecified: Secondary | ICD-10-CM | POA: Diagnosis not present

## 2022-03-28 DIAGNOSIS — I213 ST elevation (STEMI) myocardial infarction of unspecified site: Secondary | ICD-10-CM | POA: Diagnosis not present

## 2022-03-28 DIAGNOSIS — J449 Chronic obstructive pulmonary disease, unspecified: Secondary | ICD-10-CM | POA: Insufficient documentation

## 2022-03-28 LAB — CBC
HCT: 33.3 % — ABNORMAL LOW (ref 36.0–46.0)
Hemoglobin: 10.9 g/dL — ABNORMAL LOW (ref 12.0–15.0)
MCH: 32.9 pg (ref 26.0–34.0)
MCHC: 32.7 g/dL (ref 30.0–36.0)
MCV: 100.6 fL — ABNORMAL HIGH (ref 80.0–100.0)
Platelets: 276 10*3/uL (ref 150–400)
RBC: 3.31 MIL/uL — ABNORMAL LOW (ref 3.87–5.11)
RDW: 12.3 % (ref 11.5–15.5)
WBC: 8.4 10*3/uL (ref 4.0–10.5)
nRBC: 0 % (ref 0.0–0.2)

## 2022-03-28 LAB — COMPREHENSIVE METABOLIC PANEL
ALT: 15 U/L (ref 0–44)
AST: 15 U/L (ref 15–41)
Albumin: 3.3 g/dL — ABNORMAL LOW (ref 3.5–5.0)
Alkaline Phosphatase: 59 U/L (ref 38–126)
Anion gap: 6 (ref 5–15)
BUN: 15 mg/dL (ref 8–23)
CO2: 26 mmol/L (ref 22–32)
Calcium: 8.1 mg/dL — ABNORMAL LOW (ref 8.9–10.3)
Chloride: 108 mmol/L (ref 98–111)
Creatinine, Ser: 0.67 mg/dL (ref 0.44–1.00)
GFR, Estimated: >60 mL/min (ref >60)
Glucose, Bld: 100 mg/dL — ABNORMAL HIGH (ref 70–99)
Potassium: 3.3 mmol/L — ABNORMAL LOW (ref 3.5–5.1)
Sodium: 140 mmol/L (ref 135–145)
Total Bilirubin: 0.3 mg/dL (ref 0.3–1.2)
Total Protein: 5.9 g/dL — ABNORMAL LOW (ref 6.5–8.1)

## 2022-03-28 LAB — TROPONIN I (HIGH SENSITIVITY)
Troponin I (High Sensitivity): 13 ng/L (ref <18)
Troponin I (High Sensitivity): 12 ng/L (ref <18)

## 2022-03-28 LAB — LIPASE, BLOOD: Lipase: 29 U/L (ref 11–51)

## 2022-03-28 MED ORDER — FENTANYL CITRATE PF 50 MCG/ML IJ SOSY
50.0000 ug | PREFILLED_SYRINGE | Freq: Once | INTRAMUSCULAR | Status: AC
  Administered 2022-03-28: 50 ug via INTRAVENOUS
  Filled 2022-03-28: qty 1

## 2022-03-28 NOTE — Discharge Instructions (Signed)
As we discussed your workup in the emergency department showed normal heart labs however your CT scan did show possible progression of your known lung cancer.  Please follow-up with your oncologist tomorrow to inform them of today's ER visit and your CT scan.  The CT images have been shared with [facility] so that they may review your CAT scan.  Return to the emergency department for any return of/worsening pain any trouble breathing, or any other symptom personally concerning to yourself.

## 2022-03-28 NOTE — ED Triage Notes (Signed)
Pt to ED via [facility] for chest pain x 5 days that is felt under the left breast and is worse with movement. Denies sob, n/v.

## 2022-03-28 NOTE — ED Provider Notes (Signed)
[facility] Provider Note    Event Date/Time   First MD Initiated Contact with Patient 03/28/22 1622     (approximate)  History   Chief Complaint: Chest Pain  HPI  [name] [name] is a 67 y.o. female with a past medical history of COPD, hypertension, lung cancer history of radiation therapy to the lungs, presents to the emergency department for 5 days of left-sided chest pain.  According to the patient for the past 5 days she has been experiencing pain to left side of her chest, states is fairly constant.  Denies any shortness of breath nausea or diaphoresis.  Denies any abdominal pain.  Denies any personal history of cardiac disease denies any stents and does not see a cardiologist for any reason.  Physical Exam   Triage Vital Signs: ED Triage Vitals  Enc Vitals Group     BP      Pulse      Resp      Temp      Temp src      SpO2      Weight      Height      Head Circumference      Peak Flow      Pain Score      Pain Loc      Pain Edu?      Excl. in [place]?     Most recent vital signs: There were no vitals filed for this visit.  General: Awake, no distress.  CV:  Good peripheral perfusion.  Regular rate and rhythm  Resp:  Normal effort.  Equal breath sounds bilaterally.  Moderate left-sided chest wall tenderness to palpation. Abd:  No distention.  Soft, nontender.  No rebound or guarding.  ED Results / Procedures / Treatments   EKG  EKG viewed and interpreted by myself shows a normal sinus rhythm at 75 bpm with a narrow QRS, normal axis, normal intervals, nonspecific ST changes with very slight elevation in leads V2 through V3 but no reciprocal depressions reassuring morphology.  Largely unchanged from 08/03/2021.  RADIOLOGY  I have reviewed and interpreted chest x-ray images.  Patient does appear to have a small consolidation in the right upper lung. X-ray shows irregular nodular opacity in the right upper lobe recommend CT. CT scan has  resulted showing: IMPRESSION: 1. Irregular right upper lobe pulmonary nodule is similar in size but demonstrates increased density, concerning for progressive disease. PET-CT is recommended for further evaluation. 2. Irregular linear solid juxtapleural nodule of the left upper lobe with associated pleural thickening and mild osseous irregularity of the left lateral 2nd and third ribs, some component of these findings may be due to postradiation change if this lesion was more recently treated, although progressive disease is also a concern. Recommend attention on PET-CT. 3. Additional smaller solid and subsolid pulmonary nodules are seen in the right upper lobe which are increased in size when compared with prior exam concerning for metachronous primaries or metastatic disease. 4. Aortic Atherosclerosis (ICD10-I70.0) and Emphysema (ICD10-J43.9).    MEDICATIONS ORDERED IN ED: Medications - No data to display   IMPRESSION / MDM / [place] / ED COURSE  I reviewed the triage vital signs and the nursing notes.  Patient's presentation is most consistent with acute presentation with potential threat to life or bodily function.  Patient presents emergency department for left-sided chest pain she states has been fairly constant for the past 5 days.  Patient has significant reproducibility  on exam highly suspect more chest wall pain possibly due to her history of radiation.  We will check labs including cardiac enzymes to help rule out ACS.  Will obtain a chest x-ray to evaluate for pneumonia or pneumothorax.  We will treat the patient's discomfort with fentanyl and reassess once labs have resulted.  Patient is agreeable to plan of care.  Regardless given the patient's age we will likely have the patient follow-up with cardiology for stress test.  Patient is lab work is reassuring, CBC is normal including normal white blood cell count, chemistry reassuring including normal renal  function.  Troponin negative.  Lipase normal.  Chest x-ray concerning for right upper lobe nodule.  Patient states a history of lung cancer with recent radiation therapy approxi-1 month ago per patient.  She has a follow-up appointment scheduled soon with [facility] to discuss this further.  CT scan performed today does show right upper lobe nodule similar in size to prior CT but with increased density concerning for progression.  Also an irregular solid nodule to the left upper lobe with pleural thickening and mild osseous irregularity of the left second and third ribs this could very well be the cause of the patient's left-sided chest discomfort especially given the tenderness to palpation.  CT findings are concerning for progression of disease.  Patient is established with [facility] I discussed these findings with the patient she states she will call her oncologist tomorrow to discuss further workup.  We will have these images power shared with [facility] so that her physician may also review.  FINAL CLINICAL IMPRESSION(S) / ED DIAGNOSES   Chest pain   Note:  This document was prepared using Dragon voice recognition software and may include unintentional dictation errors.   Harvest Dark, MD 03/28/22 [phone]

## 2022-03-28 NOTE — ED Notes (Signed)
Images powershared to [facility] 

## 2022-03-29 DIAGNOSIS — C3411 Malignant neoplasm of upper lobe, right bronchus or lung: Secondary | ICD-10-CM | POA: Diagnosis not present

## 2022-03-30 DIAGNOSIS — Z8669 Personal history of other diseases of the nervous system and sense organs: Secondary | ICD-10-CM | POA: Diagnosis not present

## 2022-03-30 DIAGNOSIS — M3501 Sicca syndrome with keratoconjunctivitis: Secondary | ICD-10-CM | POA: Diagnosis not present

## 2022-03-30 DIAGNOSIS — H2513 Age-related nuclear cataract, bilateral: Secondary | ICD-10-CM | POA: Diagnosis not present

## 2022-04-09 ENCOUNTER — Emergency Department
Admission: EM | Admit: 2022-04-09 | Discharge: 2022-04-10 | Disposition: A | Discharge status: Home / Self Care | Payer: 59 | Attending: Student in an Organized Health Care Education/Training Program | Admitting: Student in an Organized Health Care Education/Training Program

## 2022-04-09 ENCOUNTER — Other Ambulatory Visit: Payer: Self-pay

## 2022-04-09 ENCOUNTER — Emergency Department: Payer: 59

## 2022-04-09 DIAGNOSIS — Z85118 Personal history of other malignant neoplasm of bronchus and lung: Secondary | ICD-10-CM | POA: Insufficient documentation

## 2022-04-09 DIAGNOSIS — J9811 Atelectasis: Secondary | ICD-10-CM | POA: Diagnosis not present

## 2022-04-09 DIAGNOSIS — I10 Essential (primary) hypertension: Secondary | ICD-10-CM | POA: Diagnosis not present

## 2022-04-09 DIAGNOSIS — R509 Fever, unspecified: Secondary | ICD-10-CM | POA: Diagnosis not present

## 2022-04-09 DIAGNOSIS — J449 Chronic obstructive pulmonary disease, unspecified: Secondary | ICD-10-CM | POA: Insufficient documentation

## 2022-04-09 DIAGNOSIS — J439 Emphysema, unspecified: Secondary | ICD-10-CM | POA: Diagnosis not present

## 2022-04-09 DIAGNOSIS — R0689 Other abnormalities of breathing: Secondary | ICD-10-CM | POA: Diagnosis not present

## 2022-04-09 DIAGNOSIS — R0789 Other chest pain: Secondary | ICD-10-CM

## 2022-04-09 DIAGNOSIS — R079 Chest pain, unspecified: Secondary | ICD-10-CM | POA: Diagnosis not present

## 2022-04-09 LAB — CBC WITH DIFFERENTIAL/PLATELET
Abs Immature Granulocytes: 0.01 10*3/uL (ref 0.00–0.07)
Basophils Absolute: 0 10*3/uL (ref 0.0–0.1)
Basophils Relative: 0 %
Eosinophils Absolute: 0 10*3/uL (ref 0.0–0.5)
Eosinophils Relative: 0 %
HCT: 36.4 % (ref 36.0–46.0)
Hemoglobin: 11.5 g/dL — ABNORMAL LOW (ref 12.0–15.0)
Immature Granulocytes: 0 %
Lymphocytes Relative: 15 %
Lymphs Abs: 1 10*3/uL (ref 0.7–4.0)
MCH: 33.1 pg (ref 26.0–34.0)
MCHC: 31.6 g/dL (ref 30.0–36.0)
MCV: 104.9 fL — ABNORMAL HIGH (ref 80.0–100.0)
Monocytes Absolute: 0 10*3/uL — ABNORMAL LOW (ref 0.1–1.0)
Monocytes Relative: 0 %
Neutro Abs: 5.5 10*3/uL (ref 1.7–7.7)
Neutrophils Relative %: 85 %
Platelets: 300 10*3/uL (ref 150–400)
RBC: 3.47 MIL/uL — ABNORMAL LOW (ref 3.87–5.11)
RDW: 12.4 % (ref 11.5–15.5)
WBC: 6.5 10*3/uL (ref 4.0–10.5)
nRBC: 0 % (ref 0.0–0.2)

## 2022-04-09 LAB — COMPREHENSIVE METABOLIC PANEL
ALT: 20 U/L (ref 0–44)
AST: 20 U/L (ref 15–41)
Albumin: 3.3 g/dL — ABNORMAL LOW (ref 3.5–5.0)
Alkaline Phosphatase: 75 U/L (ref 38–126)
Anion gap: 5 (ref 5–15)
BUN: 9 mg/dL (ref 8–23)
CO2: 24 mmol/L (ref 22–32)
Calcium: 7.7 mg/dL — ABNORMAL LOW (ref 8.9–10.3)
Chloride: 107 mmol/L (ref 98–111)
Creatinine, Ser: 0.58 mg/dL (ref 0.44–1.00)
GFR, Estimated: >60 mL/min (ref >60)
Glucose, Bld: 92 mg/dL (ref 70–99)
Potassium: 3.3 mmol/L — ABNORMAL LOW (ref 3.5–5.1)
Sodium: 136 mmol/L (ref 135–145)
Total Bilirubin: 0.3 mg/dL (ref 0.3–1.2)
Total Protein: 6.2 g/dL — ABNORMAL LOW (ref 6.5–8.1)

## 2022-04-09 LAB — TROPONIN I (HIGH SENSITIVITY)
Troponin I (High Sensitivity): 9 ng/L (ref <18)
Troponin I (High Sensitivity): 15 ng/L (ref <18)

## 2022-04-09 LAB — LIPASE, BLOOD: Lipase: 34 U/L (ref 11–51)

## 2022-04-09 MED ORDER — OXYCODONE-ACETAMINOPHEN 5-325 MG PO TABS: 1.0000 | ORAL_TABLET | ORAL | 0 refills | Status: AC | PRN

## 2022-04-09 MED ORDER — IOHEXOL 350 MG/ML SOLN
75.0000 mL | Freq: Once | INTRAVENOUS | Status: AC | PRN
  Administered 2022-04-09: 75 mL via INTRAVENOUS

## 2022-04-09 MED ORDER — LIDOCAINE 5 % EX PTCH
1.0000 | MEDICATED_PATCH | CUTANEOUS | Status: DC
  Administered 2022-04-09: 1 via TRANSDERMAL
  Filled 2022-04-09: qty 1

## 2022-04-09 MED ORDER — OXYCODONE-ACETAMINOPHEN 5-325 MG PO TABS
1.0000 | ORAL_TABLET | Freq: Once | ORAL | Status: AC
  Administered 2022-04-09: 1 via ORAL
  Filled 2022-04-09: qty 1

## 2022-04-09 NOTE — ED Triage Notes (Signed)
Pt BIB EMS for 10/10 left sided non radiating chest pain. Happened all of a sudden while sitting down. Pt denies SOB. Coughing makes pain worse.pt took otc pain meds with no relief. Also hypertensive 209/71

## 2022-04-09 NOTE — ED Provider Notes (Signed)
[facility] Provider Note    Event Date/Time   First MD Initiated Contact with Patient 04/09/22 Curly Rim     (approximate)   History   Chest Pain   HPI  [name] [name] is a 67 y.o. female presents to the ER for evaluation of 10 out of 10 left sided chest pain it is reproducible with movement.  Does have some pain with deep inspiration.  Worse with coughing.  No pain ripping or tearing through to her back.  No numbness or tingling.  No fevers.  She does have a history of COPD.  Does have lung cancer which is known.  She denies any abdominal pain no nausea or vomiting.     Physical Exam   Triage Vital Signs: ED Triage Vitals  Enc Vitals Group     BP --      Pulse Rate 04/09/22 1913 85     Resp 04/09/22 1913 19     Temp 04/09/22 1913 99.3 F (37.4 C)     Temp Source 04/09/22 1913 Oral     SpO2 04/09/22 1913 94 %     Weight 04/09/22 1915 98 lb (44.5 kg)     Height 04/09/22 1915 5\' 5"  (1.651 m)     Head Circumference --      Peak Flow --      Pain Score 04/09/22 1914 10     Pain Loc --      Pain Edu? --      Excl. in [place]? --     Most recent vital signs: Vitals:   04/09/22 1930 04/09/22 2100  BP: (!) 178/60 (!) 153/66  Pulse: 86 84  Resp: (!) 21 20  Temp:    SpO2:       Constitutional: Alert  Eyes: Conjunctivae are normal.  Head: Atraumatic. Nose: No congestion/rhinnorhea. Mouth/Throat: Mucous membranes are moist.   Neck: Painless ROM.  Cardiovascular:   Good peripheral circulation.  Pain is reproducible with palpation of the left anterior chest wall.  No overlying crepitus no cellulitic changes no rashes. Respiratory: Normal respiratory effort.  No retractions.  Gastrointestinal: Soft and nontender in all 4 quadrants. Musculoskeletal:  no deformity Neurologic:  MAE spontaneously. No gross focal neurologic deficits are appreciated.  Skin:  Skin is warm, dry and intact. No rash noted. Psychiatric: Mood and affect are normal. Speech and  behavior are normal.    ED Results / Procedures / Treatments   Labs (all labs ordered are listed, but only abnormal results are displayed) Labs Reviewed  CBC WITH DIFFERENTIAL/PLATELET - Abnormal; Notable for the following components:      Result Value   RBC 3.47 (*)    Hemoglobin 11.5 (*)    MCV 104.9 (*)    Monocytes Absolute 0.0 (*)    All other components within normal limits  COMPREHENSIVE METABOLIC PANEL - Abnormal; Notable for the following components:   Potassium 3.3 (*)    Calcium 7.7 (*)    Total Protein 6.2 (*)    Albumin 3.3 (*)    All other components within normal limits  LIPASE, BLOOD  TROPONIN I (HIGH SENSITIVITY)  TROPONIN I (HIGH SENSITIVITY)     EKG  ED ECG REPORT I, Merlyn Lot, the attending physician, personally viewed and interpreted this ECG.   Date: 04/09/2022  EKG Time: 19:13  Rate: 80  Rhythm: sinus  Axis: normal  Intervals: normal  ST&T Change: no stemi    RADIOLOGY Please see ED Course for  my review and interpretation.  I personally reviewed all radiographic images ordered to evaluate for the above acute complaints and reviewed radiology reports and findings.  These findings were personally discussed with the patient.  Please see medical record for radiology report.    PROCEDURES:  Critical Care performed: No  Procedures   MEDICATIONS ORDERED IN ED: Medications  lidocaine (LIDODERM) 5 % 1 patch (1 patch Transdermal Patch Applied 04/09/22 1937)  iohexol (OMNIPAQUE) 350 MG/ML injection 75 mL (75 mLs Intravenous Contrast Given 04/09/22 2051)  oxyCODONE-acetaminophen (PERCOCET/ROXICET) 5-325 MG per tablet 1 tablet (1 tablet Oral Given 04/09/22 2144)     IMPRESSION / MDM / ASSESSMENT AND PLAN / ED COURSE  I reviewed the triage vital signs and the nursing notes.                              Differential diagnosis includes, but is not limited to, ACS, pericarditis, esophagitis, boerhaaves, pe, dissection, pna, bronchitis,  costochondritis  Patient presenting to the ER for evaluation of symptoms as described above.  Based on symptoms, risk factors and considered above differential, this presenting complaint could reflect a potentially life-threatening illness therefore the patient will be placed on continuous pulse oximetry and telemetry for monitoring.  Laboratory evaluation will be sent to evaluate for the above complaints.  Chest x-ray without acute findings.  Pain is quite reproducible.  She is not on anticoagulation.  Does not seem consistent with dissection but will order CTA to evaluate for PE.  Large component of her pain seems musculoskeletal in nature but given age and risk factors will order serial troponins.  Her abdominal exam is soft benign.  She is not complaining of any abdominal pain nausea or vomiting that would suggest abdominal process radiating to the chest.    Clinical Course as of 04/09/22 2339  Sat Apr 09, 2022  2113 CT imaging of the chest ordered due to concern for PE.  CTA my review and interpretation does not show any evidence of PE.  Will await formal radiology report. [PR]  2232 Troponins flat.'s do suspect all of her pain is related to this malignancy versus musculoskeletal pain.  Pain improved with Percocet.  No findings to suggest COPD exacerbation at this time.  Findings of celiac access on CT I suspect are chronic.  Not seem consistent with mesenteric ischemia given lack of abdominal pain or other findings on exam or history. [PR]  2232 Discussed imaging workup with patient on reassessment.  Does appear stable.  Patient already has close outpatient follow-up with [facility] this week.  Will give her prescription for pain medications. [PR]    Clinical Course User Index [PR] Merlyn Lot, MD     FINAL CLINICAL IMPRESSION(S) / ED DIAGNOSES   Final diagnoses:  Chest wall pain     Rx / DC Orders   ED Discharge Orders          Ordered    oxyCODONE-acetaminophen (PERCOCET) 5-325 MG  tablet  Every 4 hours PRN        04/09/22 2257             Note:  This document was prepared using Dragon voice recognition software and may include unintentional dictation errors.    Merlyn Lot, MD 04/09/22 [phone]

## 2022-04-10 NOTE — ED Notes (Signed)
Pt up eating food and ambulated to bathroom with assistance

## 2022-04-10 NOTE — ED Notes (Signed)
Pt waiting on cab, denies any needs at this time

## 2022-04-15 ENCOUNTER — Telehealth: Payer: Self-pay

## 2022-04-15 NOTE — Telephone Encounter (Signed)
     Patient  visit on 04/10/2022  at [facility] was for chest wall pain.  Have you been able to follow up with your primary care physician? Yes. Appointment 04/25/22.  The patient was or was not able to obtain any needed medicine or equipment. Patient was able to obtain medication.  Are there diet recommendations that you are having difficulty following? No  Patient expresses understanding of discharge instructions and education provided has no other needs at this time. Yes   [place] Resource Care Guide   [email or web address]  ?? [phone]   Website: [email or web address]  [email or web address]

## 2022-06-10 DIAGNOSIS — Z720 Tobacco use: Secondary | ICD-10-CM | POA: Diagnosis not present

## 2022-06-10 DIAGNOSIS — R627 Adult failure to thrive: Secondary | ICD-10-CM | POA: Diagnosis not present

## 2022-06-10 DIAGNOSIS — C3490 Malignant neoplasm of unspecified part of unspecified bronchus or lung: Secondary | ICD-10-CM | POA: Diagnosis not present

## 2022-06-10 DIAGNOSIS — I420 Dilated cardiomyopathy: Secondary | ICD-10-CM | POA: Diagnosis not present

## 2022-06-10 DIAGNOSIS — M810 Age-related osteoporosis without current pathological fracture: Secondary | ICD-10-CM | POA: Diagnosis not present

## 2022-06-10 DIAGNOSIS — K90829 Short bowel syndrome, unspecified: Secondary | ICD-10-CM | POA: Diagnosis not present

## 2022-07-05 DIAGNOSIS — Z923 Personal history of irradiation: Secondary | ICD-10-CM | POA: Diagnosis not present

## 2022-07-05 DIAGNOSIS — C3411 Malignant neoplasm of upper lobe, right bronchus or lung: Secondary | ICD-10-CM | POA: Diagnosis not present

## 2022-07-05 DIAGNOSIS — J449 Chronic obstructive pulmonary disease, unspecified: Secondary | ICD-10-CM | POA: Diagnosis not present

## 2022-07-05 DIAGNOSIS — C3492 Malignant neoplasm of unspecified part of left bronchus or lung: Secondary | ICD-10-CM | POA: Diagnosis not present

## 2022-09-21 DIAGNOSIS — I420 Dilated cardiomyopathy: Secondary | ICD-10-CM | POA: Diagnosis not present

## 2022-09-21 DIAGNOSIS — R627 Adult failure to thrive: Secondary | ICD-10-CM | POA: Diagnosis not present

## 2022-09-21 DIAGNOSIS — Z720 Tobacco use: Secondary | ICD-10-CM | POA: Diagnosis not present

## 2022-10-06 DIAGNOSIS — I6529 Occlusion and stenosis of unspecified carotid artery: Secondary | ICD-10-CM | POA: Diagnosis not present

## 2022-10-06 DIAGNOSIS — R918 Other nonspecific abnormal finding of lung field: Secondary | ICD-10-CM | POA: Diagnosis not present

## 2022-10-06 DIAGNOSIS — C3411 Malignant neoplasm of upper lobe, right bronchus or lung: Secondary | ICD-10-CM | POA: Diagnosis not present

## 2022-10-24 DIAGNOSIS — I420 Dilated cardiomyopathy: Secondary | ICD-10-CM | POA: Diagnosis not present

## 2022-10-24 DIAGNOSIS — C3490 Malignant neoplasm of unspecified part of unspecified bronchus or lung: Secondary | ICD-10-CM | POA: Diagnosis not present

## 2022-10-24 DIAGNOSIS — K90829 Short bowel syndrome, unspecified: Secondary | ICD-10-CM | POA: Diagnosis not present

## 2022-10-24 DIAGNOSIS — R627 Adult failure to thrive: Secondary | ICD-10-CM | POA: Diagnosis not present

## 2022-11-21 ENCOUNTER — Other Ambulatory Visit: Payer: Self-pay

## 2022-11-21 ENCOUNTER — Emergency Department: Payer: 59

## 2022-11-21 ENCOUNTER — Inpatient Hospital Stay
Admission: EM | Admit: 2022-11-21 | Discharge: 2022-11-23 | DRG: 917 | Disposition: A | Discharge status: Home Health | Payer: 59 | Attending: Internal Medicine | Admitting: Internal Medicine

## 2022-11-21 DIAGNOSIS — K529 Noninfective gastroenteritis and colitis, unspecified: Secondary | ICD-10-CM | POA: Diagnosis not present

## 2022-11-21 DIAGNOSIS — R109 Unspecified abdominal pain: Secondary | ICD-10-CM | POA: Diagnosis not present

## 2022-11-21 DIAGNOSIS — C3490 Malignant neoplasm of unspecified part of unspecified bronchus or lung: Secondary | ICD-10-CM | POA: Diagnosis present

## 2022-11-21 DIAGNOSIS — W19XXXA Unspecified fall, initial encounter: Secondary | ICD-10-CM | POA: Diagnosis not present

## 2022-11-21 DIAGNOSIS — Z85118 Personal history of other malignant neoplasm of bronchus and lung: Secondary | ICD-10-CM | POA: Diagnosis not present

## 2022-11-21 DIAGNOSIS — R6889 Other general symptoms and signs: Secondary | ICD-10-CM | POA: Diagnosis not present

## 2022-11-21 DIAGNOSIS — R9431 Abnormal electrocardiogram [ECG] [EKG]: Secondary | ICD-10-CM | POA: Diagnosis not present

## 2022-11-21 DIAGNOSIS — N3090 Cystitis, unspecified without hematuria: Secondary | ICD-10-CM | POA: Diagnosis not present

## 2022-11-21 DIAGNOSIS — F17210 Nicotine dependence, cigarettes, uncomplicated: Secondary | ICD-10-CM | POA: Diagnosis not present

## 2022-11-21 DIAGNOSIS — T405X1A Poisoning by cocaine, accidental (unintentional), initial encounter: Secondary | ICD-10-CM | POA: Diagnosis not present

## 2022-11-21 DIAGNOSIS — F329 Major depressive disorder, single episode, unspecified: Secondary | ICD-10-CM | POA: Diagnosis present

## 2022-11-21 DIAGNOSIS — E876 Hypokalemia: Secondary | ICD-10-CM | POA: Diagnosis not present

## 2022-11-21 DIAGNOSIS — N3289 Other specified disorders of bladder: Secondary | ICD-10-CM | POA: Diagnosis not present

## 2022-11-21 DIAGNOSIS — E43 Unspecified severe protein-calorie malnutrition: Secondary | ICD-10-CM | POA: Diagnosis present

## 2022-11-21 DIAGNOSIS — Z8269 Family history of other diseases of the musculoskeletal system and connective tissue: Secondary | ICD-10-CM | POA: Diagnosis not present

## 2022-11-21 DIAGNOSIS — Z681 Body mass index (BMI) 19 or less, adult: Secondary | ICD-10-CM | POA: Diagnosis not present

## 2022-11-21 DIAGNOSIS — J410 Simple chronic bronchitis: Secondary | ICD-10-CM | POA: Diagnosis not present

## 2022-11-21 DIAGNOSIS — R531 Weakness: Secondary | ICD-10-CM

## 2022-11-21 DIAGNOSIS — E878 Other disorders of electrolyte and fluid balance, not elsewhere classified: Secondary | ICD-10-CM | POA: Insufficient documentation

## 2022-11-21 DIAGNOSIS — J449 Chronic obstructive pulmonary disease, unspecified: Secondary | ICD-10-CM | POA: Diagnosis present

## 2022-11-21 DIAGNOSIS — M25562 Pain in left knee: Secondary | ICD-10-CM | POA: Diagnosis present

## 2022-11-21 DIAGNOSIS — I6782 Cerebral ischemia: Secondary | ICD-10-CM | POA: Diagnosis not present

## 2022-11-21 DIAGNOSIS — Z85028 Personal history of other malignant neoplasm of stomach: Secondary | ICD-10-CM

## 2022-11-21 DIAGNOSIS — R4789 Other speech disturbances: Secondary | ICD-10-CM | POA: Diagnosis not present

## 2022-11-21 DIAGNOSIS — Z923 Personal history of irradiation: Secondary | ICD-10-CM

## 2022-11-21 DIAGNOSIS — E440 Moderate protein-calorie malnutrition: Secondary | ICD-10-CM | POA: Insufficient documentation

## 2022-11-21 DIAGNOSIS — R918 Other nonspecific abnormal finding of lung field: Secondary | ICD-10-CM | POA: Diagnosis not present

## 2022-11-21 DIAGNOSIS — F8081 Childhood onset fluency disorder: Secondary | ICD-10-CM | POA: Diagnosis not present

## 2022-11-21 DIAGNOSIS — Z7951 Long term (current) use of inhaled steroids: Secondary | ICD-10-CM

## 2022-11-21 DIAGNOSIS — M25519 Pain in unspecified shoulder: Secondary | ICD-10-CM | POA: Diagnosis not present

## 2022-11-21 DIAGNOSIS — Z743 Need for continuous supervision: Secondary | ICD-10-CM | POA: Diagnosis not present

## 2022-11-21 DIAGNOSIS — F1410 Cocaine abuse, uncomplicated: Secondary | ICD-10-CM | POA: Diagnosis present

## 2022-11-21 DIAGNOSIS — I10 Essential (primary) hypertension: Secondary | ICD-10-CM | POA: Diagnosis not present

## 2022-11-21 DIAGNOSIS — J439 Emphysema, unspecified: Secondary | ICD-10-CM | POA: Diagnosis not present

## 2022-11-21 DIAGNOSIS — Z886 Allergy status to analgesic agent status: Secondary | ICD-10-CM | POA: Diagnosis not present

## 2022-11-21 DIAGNOSIS — E785 Hyperlipidemia, unspecified: Secondary | ICD-10-CM | POA: Diagnosis not present

## 2022-11-21 DIAGNOSIS — K56600 Partial intestinal obstruction, unspecified as to cause: Secondary | ICD-10-CM | POA: Diagnosis present

## 2022-11-21 DIAGNOSIS — R1084 Generalized abdominal pain: Secondary | ICD-10-CM | POA: Diagnosis not present

## 2022-11-21 DIAGNOSIS — R519 Headache, unspecified: Secondary | ICD-10-CM | POA: Diagnosis not present

## 2022-11-21 DIAGNOSIS — M542 Cervicalgia: Secondary | ICD-10-CM | POA: Diagnosis not present

## 2022-11-21 DIAGNOSIS — C169 Malignant neoplasm of stomach, unspecified: Secondary | ICD-10-CM | POA: Diagnosis not present

## 2022-11-21 DIAGNOSIS — I700 Atherosclerosis of aorta: Secondary | ICD-10-CM | POA: Diagnosis not present

## 2022-11-21 DIAGNOSIS — R55 Syncope and collapse: Secondary | ICD-10-CM | POA: Diagnosis not present

## 2022-11-21 LAB — CBC
HCT: 34 % — ABNORMAL LOW (ref 36.0–46.0)
Hemoglobin: 11.5 g/dL — ABNORMAL LOW (ref 12.0–15.0)
MCH: 34 pg (ref 26.0–34.0)
MCHC: 33.8 g/dL (ref 30.0–36.0)
MCV: 100.6 fL — ABNORMAL HIGH (ref 80.0–100.0)
Platelets: 254 10*3/uL (ref 150–400)
RBC: 3.38 MIL/uL — ABNORMAL LOW (ref 3.87–5.11)
RDW: 12.2 % (ref 11.5–15.5)
WBC: 10.7 10*3/uL — ABNORMAL HIGH (ref 4.0–10.5)
nRBC: 0 % (ref 0.0–0.2)

## 2022-11-21 LAB — DIFFERENTIAL
Abs Immature Granulocytes: 0.03 10*3/uL (ref 0.00–0.07)
Basophils Absolute: 0 10*3/uL (ref 0.0–0.1)
Basophils Relative: 0 %
Eosinophils Absolute: 0 10*3/uL (ref 0.0–0.5)
Eosinophils Relative: 0 %
Immature Granulocytes: 0 %
Lymphocytes Relative: 14 %
Lymphs Abs: 1.5 10*3/uL (ref 0.7–4.0)
Monocytes Absolute: 0.6 10*3/uL (ref 0.1–1.0)
Monocytes Relative: 6 %
Neutro Abs: 8.6 10*3/uL — ABNORMAL HIGH (ref 1.7–7.7)
Neutrophils Relative %: 80 %

## 2022-11-21 LAB — PROTIME-INR
INR: 1.2 (ref 0.8–1.2)
Prothrombin Time: 15.5 s — ABNORMAL HIGH (ref 11.4–15.2)

## 2022-11-21 LAB — COMPREHENSIVE METABOLIC PANEL
ALT: 17 U/L (ref 0–44)
AST: 13 U/L — ABNORMAL LOW (ref 15–41)
Albumin: 3.5 g/dL (ref 3.5–5.0)
Alkaline Phosphatase: 96 U/L (ref 38–126)
Anion gap: 8 (ref 5–15)
BUN: 14 mg/dL (ref 8–23)
CO2: 22 mmol/L (ref 22–32)
Calcium: 7.6 mg/dL — ABNORMAL LOW (ref 8.9–10.3)
Chloride: 109 mmol/L (ref 98–111)
Creatinine, Ser: 0.56 mg/dL (ref 0.44–1.00)
GFR, Estimated: >60 mL/min (ref >60)
Glucose, Bld: 112 mg/dL — ABNORMAL HIGH (ref 70–99)
Potassium: 2.4 mmol/L — CL (ref 3.5–5.1)
Sodium: 139 mmol/L (ref 135–145)
Total Bilirubin: 0.5 mg/dL (ref 0.3–1.2)
Total Protein: 6.2 g/dL — ABNORMAL LOW (ref 6.5–8.1)

## 2022-11-21 LAB — APTT: aPTT: 31 s (ref 24–36)

## 2022-11-21 LAB — ETHANOL: Alcohol, Ethyl (B): <10 mg/dL (ref <10)

## 2022-11-21 LAB — TROPONIN I (HIGH SENSITIVITY)
Troponin I (High Sensitivity): 28 ng/L — ABNORMAL HIGH (ref <18)
Troponin I (High Sensitivity): 42 ng/L — ABNORMAL HIGH (ref <18)

## 2022-11-21 LAB — MAGNESIUM: Magnesium: 1.8 mg/dL (ref 1.7–2.4)

## 2022-11-21 LAB — LIPASE, BLOOD: Lipase: 25 U/L (ref 11–51)

## 2022-11-21 MED ORDER — POTASSIUM CHLORIDE CRYS ER 20 MEQ PO TBCR
40.0000 meq | EXTENDED_RELEASE_TABLET | Freq: Once | ORAL | Status: AC
Start: 2022-11-21 — End: 2022-11-21
  Administered 2022-11-21: 40 meq via ORAL
  Filled 2022-11-21: qty 2

## 2022-11-21 MED ORDER — SODIUM CHLORIDE 0.9% FLUSH
3.0000 mL | Freq: Once | INTRAVENOUS | Status: AC
  Administered 2022-11-21: 3 mL via INTRAVENOUS

## 2022-11-21 MED ORDER — POTASSIUM CHLORIDE 10 MEQ/100ML IV SOLN
10.0000 meq | INTRAVENOUS | Status: AC
  Administered 2022-11-21 (×2): 10 meq via INTRAVENOUS
  Filled 2022-11-21 (×2): qty 100

## 2022-11-21 MED ORDER — IOHEXOL 350 MG/ML SOLN
60.0000 mL | Freq: Once | INTRAVENOUS | Status: AC | PRN
  Administered 2022-11-21: 60 mL via INTRAVENOUS

## 2022-11-21 NOTE — ED Triage Notes (Signed)
Pt to ED [facility] from home, for different complaints. Reports abd pain, hx stomach cancer. Also reports fall on Saturday, denies hitting head or blood thinners. Pt with stuttering speech and left sided weakness started Saturday. Family member told ems speech problems started several days ago.  Ems reports pt had brief periods of normal speech.

## 2022-11-21 NOTE — H&P (Signed)
History and Physical    Patient: [name] [name]:[id number] DOB: 1955-10-30 DOA: 11/21/2022 DOS: the patient was seen and examined on 11/22/2022 PCP: Corky Downs, MD  Patient coming from: Home  Chief Complaint:  Chief Complaint  Patient presents with   Abdominal Pain   Fall    HPI: [name] [name] is a 67 y.o. female with medical history significant for essential hypertension, hyperlipidemia, major depression, lung cancer, COPD coming for multiple complaints of and pain and falls.    Patient comes to [place] for abdominal pain, fall on Saturday, left-sided weakness and stuttering of his speech on Saturday.  Per report stuttering has been going on and off for the past 5 days or so.  Left-sided weakness has been going on for about a month.  No trauma to head or no striking his head.  No report of loss of consciousness.  Per report patient also has been having diarrhea. Patient states she has been having abdominal pains since Saturday is generalized abdominal pain where she had a history of abdominal surgery for her stomach cancer she follows up with her doctor at [facility] for the same.  Patient states that she also has weakness in her left side in her shoulder that is been going on for about 6 months or so and that is not new but she did have a fall and she fell because her knee gave out and she was weak on the left side or the left knee rather.  Patient does not report any incontinence headache speech or other issues.  States that she lives alone does not drink any alcohol does not use any drugs.  She does continue to smoke and is weaning herself off.  Currently she does report pain in her left knee because of the fall on her left knee.  In emergency room vitals trend shows: Vitals:   11/21/22 2230 11/21/22 2341 11/22/22 0100 11/22/22 0230  BP: (!) 140/113  (!) 146/47 (!) 130/48  Pulse: 92  (!) 56 (!) 55  Temp:  99.1 F (37.3 C)    Resp: (!) 22  (!) 25 15  Height:      Weight:      SpO2: 97%   97% 98%  TempSrc:  Oral    BMI (Calculated):      Lab work today shows hypokalemia of 2.4 glucose 112 normal kidney function normal liver function, white count of 10.7 anemia of 11.5 MCV of 100.6 normal platelets, initial troponin of 28 and repeat of 42.  Alcohol level less than 10. 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 EKG today shows sinus rhythm at 79 with biventricular hypertrophy, PR 138 QTc of 470, question septal infarct, lateral infarct with T wave inversions in 2 3 aVF and lateral leads which is different than his previous EKG in March of this year.  EKG does look like ST elevations which is noted LVH and was discussed by EDMD with cardiology on-call. Most recent  2D echocardiogram was in September 2022 showing: 1. Left ventricular ejection fraction, by estimation, is 55 to 60%. The  left ventricle has normal function. The left ventricle has no regional  wall motion abnormalities. There is moderate left ventricular hypertrophy.  Left ventricular diastolic  parameters are consistent with Grade I diastolic dysfunction (impaired  relaxation). The average left ventricular global longitudinal strain is  -17.7 %. The global longitudinal strain is normal.   2. Right ventricular systolic function is normal. The right ventricular  size is normal. There is normal pulmonary artery  systolic pressure. The  estimated right ventricular systolic pressure is 31.0 mmHg.   3. The mitral valve is normal in structure. Mild mitral valve  regurgitation.  At bedside patient's telemetry showing mild ST depression and we will repeat EKG and follow-up troponins. In the ED pt received: Medications  sodium chloride flush (NS) 0.9 % injection 3 mL (3 mLs Intravenous Given 11/22/22 0220)  lactated ringers infusion (has no administration in time range)  acetaminophen (TYLENOL) tablet 650 mg (has no administration in time range)    Or  acetaminophen (TYLENOL) suppository 650 mg (has no administration in time range)  docusate sodium (COLACE) capsule 100 mg (100 mg Oral Not Given 11/22/22 0220)  polyethylene glycol (MIRALAX / GLYCOLAX) packet 17 g (has no administration in time range)  bisacodyl (DULCOLAX) EC tablet 5 mg (has no administration in time range)  sodium phosphate (FLEET) enema 1 enema (has no administration in time range)  ondansetron (ZOFRAN) tablet 4 mg (has no administration in time range)    Or  ondansetron (ZOFRAN) injection 4 mg (has no administration in time range)  heparin injection 5,000 Units (has no administration in time range)  atorvastatin (LIPITOR) tablet 40 mg (has no administration in time range)  benzonatate (TESSALON) capsule 100 mg (has no administration in time range)  buPROPion ER (WELLBUTRIN SR) 12 hr tablet 100 mg (has no administration in time range)  feeding supplement (ENSURE ENLIVE / ENSURE PLUS) liquid 237 mL (has no administration in time range)  fluticasone (FLONASE) 50 MCG/ACT nasal spray 1 spray (has no administration in time range)  sertraline (ZOLOFT) tablet 50 mg (has no administration in time range)  fluticasone furoate-vilanterol (BREO ELLIPTA) 200-25 MCG/ACT 1 puff (has no administration in time range)  pregabalin (LYRICA) capsule 100 mg (has no administration in time range)  pantoprazole (PROTONIX) injection 40 mg (has no administration  in time range)  mirtazapine (REMERON) tablet 15 mg (has no administration in time range)  albuterol (PROVENTIL) (2.5 MG/3ML) 0.083% nebulizer solution 2.5 mg (has no administration in time range)  sodium chloride flush (NS) 0.9 % injection 3 mL (3 mLs Intravenous Given 11/21/22 1757)  potassium chloride SA (KLOR-CON M) CR tablet 40 mEq (40 mEq Oral Given 11/21/22 1742)  potassium chloride 10 mEq in 100 mL IVPB (0 mEq Intravenous Stopped 11/21/22 2225)  iohexol (OMNIPAQUE) 350 MG/ML injection 60 mL (60 mLs Intravenous Contrast Given 11/21/22 1806)   Review of Systems  Gastrointestinal:  Positive for abdominal pain, diarrhea and nausea.  Musculoskeletal:  Positive for falls.  Neurological:  Positive for speech change and weakness.   Past Medical History:  Diagnosis Date   Bowel obstruction (HCC)    x1 month ago   Cancer [facility])    stomach cancer per pt and currently lung ca   COPD (chronic obstructive pulmonary disease) (HCC)    Essential hypertension 12/02/2019   Lung cancer [facility])    Personal history of radiation therapy 2019-2020  lung ca   Past Surgical History:  Procedure Laterality Date   COLON SURGERY      reports that she has been smoking cigarettes. She has never used smokeless tobacco. She reports current alcohol use. She reports that she does not use drugs.  Allergies  Allergen Reactions   Aspirin Hives and Itching   Family History  Problem Relation Age of Onset   Lupus Sister    Heart failure Neg Hx    Diabetes Neg Hx    Prior to Admission medications   Medication Sig Start Date End Date Taking? Authorizing Provider  acetaminophen (TYLENOL) 160 MG/5ML solution Take 5 mLs (160 mg total) by mouth every 6 (six) hours as needed for mild pain. 09/17/19   Corky Downs, MD  albuterol (PROVENTIL) (2.5 MG/3ML) 0.083% nebulizer solution Take 3 mLs (2.5 mg total) by nebulization 3 (three) times daily as needed for wheezing. 06/29/21   Corky Downs, MD  albuterol (VENTOLIN HFA)  108 (90 Base) MCG/ACT inhaler Inhale 1 puff into the lungs 2 (two) times daily as needed. 08/31/21   Corky Downs, MD  atorvastatin (LIPITOR) 40 MG tablet Take 40 mg by mouth daily.    [provider]  benzonatate (TESSALON) 100 MG capsule TAKE 2 CAPSULES BY MOUTH 3 TIMES DAILY AS NEEDED FOR COUGH 06/22/21   Corky Downs, MD  buPROPion (WELLBUTRIN SR) 100 MG 12 hr tablet Take 1 tablet (100 mg total) by mouth 2 (two) times daily. 09/17/19   Corky Downs, MD  cholestyramine Lanetta Inch) 4 GM/DOSE powder TAKE 1 SCOOPFUL(4 GRAMS) MIXED IN 4-8 OUNCES OF LIQUID AND DRINK, DAILY 10/25/21   Corky Downs, MD  cyanocobalamin (VITAMIN B12) 250 MCG tablet Take 1 tablet (250 mcg total) by mouth daily. 08/31/21   Corky Downs, MD  cycloSPORINE (RESTASIS) 0.05 % ophthalmic emulsion Place 1 drop into both eyes every 12 (twelve) hours.    [provider]  dicyclomine (BENTYL) 10 MG capsule Take 1 capsule (10 mg total) by mouth 4 (four) times daily -  before meals and at bedtime for 3 days. 08/03/21 08/06/21  Gilles Chiquito, MD  feeding supplement, ENSURE ENLIVE, (ENSURE ENLIVE) LIQD Take 237 mLs by mouth 2 (two) times daily between meals. 09/10/16   Katharina Caper, MD  fluticasone (FLONASE) 50 MCG/ACT nasal spray USE 1 SPRAY IN EACH NOSTRIL 3 TIMES DAILY 01/12/22   Corky Downs, MD  hydrochlorothiazide (MICROZIDE) 12.5 MG capsule Take 12.5 mg by mouth daily.    [provider]  hydrOXYzine (ATARAX/VISTARIL) 25 MG tablet Take 25 mg by mouth 3 (three) times daily as needed.    [provider]  lipase/protease/amylase (CREON) 12000-38000 units CPEP capsule Take by mouth.    [provider]  lisinopril (ZESTRIL) 40 MG tablet Take 1 tablet (40 mg total) by mouth daily. 12/08/20   Corky Downs, MD  mirtazapine (REMERON) 15 MG tablet Take 15 mg by mouth daily.    [provider]  omeprazole (PRILOSEC) 20 MG capsule TAKE 1 CAPSULE BY MOUTH TWICE DAILY 07/20/20   Corky Downs,  MD  ondansetron (ZOFRAN) 4 MG tablet Take 1 tablet (4 mg total) by mouth every 8 (eight) hours as needed for up to 10 doses for nausea or vomiting. 08/03/21   Gilles Chiquito, MD  oxyCODONE-acetaminophen (PERCOCET) 5-325 MG tablet Take 1 tablet by mouth every 4 (four) hours as needed for severe pain. 04/09/22 04/09/23  Willy Eddy, MD  pantoprazole (PROTONIX) 40 MG tablet TAKE 1 TABLET BY MOUTH ONCE  DAILY 01/12/22   Corky Downs, MD  pregabalin (LYRICA) 100 MG capsule Take 100 mg by mouth 3 (three) times daily.     [provider]  QUEtiapine (SEROQUEL) 100 MG tablet Take 100 mg by mouth at bedtime. 04/06/21   [provider]  sertraline (ZOLOFT) 50 MG tablet TAKE 1 TABLET BY MOUTH TWICE DAILY 01/12/22   Corky Downs, MD  SYMBICORT 160-4.5 MCG/ACT inhaler INHALE 2 PUFFS TWICE A DAY RINSE MOUTH WITH WATER AFTER EACH USE 01/12/22   Corky Downs, MD  Vitamin D, Ergocalciferol, (DRISDOL) 1.25 MG (50000 UNIT) CAPS capsule TAKE 1 CAPSULE BY MOUTH WEEKLY 09/02/21   Corky Downs, MD   Vitals:   11/21/22 2230 11/21/22 2341 11/22/22 0100 11/22/22 0230  BP: (!) 140/113  (!) 146/47 (!) 130/48  Pulse: 92  (!) 56 (!) 55  Resp: (!) 22  (!) 25 15  Temp:  99.1 F (37.3 C)    TempSrc:  Oral    SpO2: 97%  97% 98%  Weight:      Height:       Physical Exam Vitals and nursing note reviewed.  Constitutional:      General: She is awake. She is not in acute distress.    Appearance: She is underweight.  HENT:     Head: Normocephalic and atraumatic.     Right Ear: Hearing normal.     Left Ear: Hearing normal.     Nose: Nose normal. No nasal deformity.     Mouth/Throat:     Lips: Pink.     Tongue: No lesions.     Pharynx: Oropharynx is clear.  Eyes:     General: Lids are normal.     Extraocular Movements: Extraocular movements intact.  Cardiovascular:     Rate and Rhythm: Normal rate and regular rhythm.     Heart sounds: Normal heart sounds.  Pulmonary:     Effort: Pulmonary  effort is normal.     Breath sounds: Normal breath sounds.  Abdominal:     General: Bowel sounds are normal. There is no distension.     Palpations: Abdomen is soft. There is no mass.     Tenderness: There is generalized abdominal tenderness.  Musculoskeletal:     Right lower leg: No edema.     Left lower leg: No edema.  Skin:    General: Skin is warm.  Neurological:     General: No focal deficit present.     Mental Status: She is alert and oriented to person, place, and time.     Cranial Nerves: Cranial nerves 2-12 are intact.     Motor: No weakness.  Psychiatric:        Attention and Perception: Attention normal.        Mood and Affect: Mood normal.        Speech: Speech normal.        Behavior: Behavior normal. Behavior is cooperative.   Labs on Admission: I have personally reviewed following labs and imaging studies Results for orders placed or performed during the hospital encounter of 11/21/22 (from the past 24 hour(s))  Protime-INR     Status: Abnormal   Collection Time: 11/21/22  4:31 PM  Result Value Ref Range   Prothrombin Time 15.5 (H) 11.4 - 15.2 seconds   INR 1.2 0.8 - 1.2  APTT     Status: None   Collection Time: 11/21/22  4:31 PM  Result Value Ref Range   aPTT 31 24 - 36  seconds  CBC     Status: Abnormal   Collection Time: 11/21/22  4:31 PM  Result Value Ref Range   WBC 10.7 (H) 4.0 - 10.5 K/uL   RBC 3.38 (L) 3.87 - 5.11 MIL/uL   Hemoglobin 11.5 (L) 12.0 - 15.0 g/dL   HCT 36.6 (L) 44.0 - 34.7 %   MCV 100.6 (H) 80.0 - 100.0 fL   MCH 34.0 26.0 - 34.0 pg   MCHC 33.8 30.0 - 36.0 g/dL   RDW 42.5 95.6 - 38.7 %   Platelets 254 150 - 400 K/uL   nRBC 0.0 0.0 - 0.2 %  Differential     Status: Abnormal   Collection Time: 11/21/22  4:31 PM  Result Value Ref Range   Neutrophils Relative % 80 %   Neutro Abs 8.6 (H) 1.7 - 7.7 K/uL   Lymphocytes Relative 14 %   Lymphs Abs 1.5 0.7 - 4.0 K/uL   Monocytes Relative 6 %   Monocytes Absolute 0.6 0.1 - 1.0 K/uL    Eosinophils Relative 0 %   Eosinophils Absolute 0.0 0.0 - 0.5 K/uL   Basophils Relative 0 %   Basophils Absolute 0.0 0.0 - 0.1 K/uL   Immature Granulocytes 0 %   Abs Immature Granulocytes 0.03 0.00 - 0.07 K/uL  Comprehensive metabolic panel     Status: Abnormal   Collection Time: 11/21/22  4:31 PM  Result Value Ref Range   Sodium 139 135 - 145 mmol/L   Potassium 2.4 (LL) 3.5 - 5.1 mmol/L   Chloride 109 98 - 111 mmol/L   CO2 22 22 - 32 mmol/L   Glucose, Bld 112 (H) 70 - 99 mg/dL   BUN 14 8 - 23 mg/dL   Creatinine, Ser 5.64 0.44 - 1.00 mg/dL   Calcium 7.6 (L) 8.9 - 10.3 mg/dL   Total Protein 6.2 (L) 6.5 - 8.1 g/dL   Albumin 3.5 3.5 - 5.0 g/dL   AST 13 (L) 15 - 41 U/L   ALT 17 0 - 44 U/L   Alkaline Phosphatase 96 38 - 126 U/L   Total Bilirubin 0.5 0.3 - 1.2 mg/dL   GFR, Estimated >33 >29 mL/min   Anion gap 8 5 - 15  Ethanol     Status: None   Collection Time: 11/21/22  4:31 PM  Result Value Ref Range   Alcohol, Ethyl (B) <10 <10 mg/dL  Lipase, blood     Status: None   Collection Time: 11/21/22  4:31 PM  Result Value Ref Range   Lipase 25 11 - 51 U/L  Troponin I (High Sensitivity)     Status: Abnormal   Collection Time: 11/21/22  4:31 PM  Result Value Ref Range   Troponin I (High Sensitivity) 28 (H) <18 ng/L  Magnesium     Status: None   Collection Time: 11/21/22  4:31 PM  Result Value Ref Range   Magnesium 1.8 1.7 - 2.4 mg/dL  T4, free     Status: None   Collection Time: 11/21/22  6:58 PM  Result Value Ref Range   Free T4 0.91 0.61 - 1.12 ng/dL  TSH     Status: None   Collection Time: 11/21/22  6:58 PM  Result Value Ref Range   TSH 0.360 0.350 - 4.500 uIU/mL  Troponin I (High Sensitivity)     Status: Abnormal   Collection Time: 11/21/22  6:59 PM  Result Value Ref Range   Troponin I (High Sensitivity) 42 (H) <18 ng/L  Gastrointestinal  Panel by PCR , Stool     Status: None   Collection Time: 11/21/22 11:21 PM   Specimen: Stool  Result Value Ref Range    Campylobacter species NOT DETECTED NOT DETECTED   Plesimonas shigelloides NOT DETECTED NOT DETECTED   Salmonella species NOT DETECTED NOT DETECTED   Yersinia enterocolitica NOT DETECTED NOT DETECTED   Vibrio species NOT DETECTED NOT DETECTED   Vibrio cholerae NOT DETECTED NOT DETECTED   Enteroaggregative E coli (EAEC) NOT DETECTED NOT DETECTED   Enteropathogenic E coli (EPEC) NOT DETECTED NOT DETECTED   Enterotoxigenic E coli (ETEC) NOT DETECTED NOT DETECTED   Shiga like toxin producing E coli (STEC) NOT DETECTED NOT DETECTED   Shigella/Enteroinvasive E coli (EIEC) NOT DETECTED NOT DETECTED   Cryptosporidium NOT DETECTED NOT DETECTED   Cyclospora cayetanensis NOT DETECTED NOT DETECTED   Entamoeba histolytica NOT DETECTED NOT DETECTED   Giardia lamblia NOT DETECTED NOT DETECTED   Adenovirus F40/41 NOT DETECTED NOT DETECTED   Astrovirus NOT DETECTED NOT DETECTED   Norovirus GI/GII NOT DETECTED NOT DETECTED   Rotavirus A NOT DETECTED NOT DETECTED   Sapovirus (I, II, IV, and V) NOT DETECTED NOT DETECTED  C Difficile Quick Screen w PCR reflex     Status: None   Collection Time: 11/21/22 11:21 PM   Specimen: Stool  Result Value Ref Range   C Diff antigen NEGATIVE NEGATIVE   C Diff toxin NEGATIVE NEGATIVE   C Diff interpretation No C. difficile detected.    CBC: Recent Labs  Lab 11/21/22 1631  WBC 10.7*  NEUTROABS 8.6*  HGB 11.5*  HCT 34.0*  MCV 100.6*  PLT 254   Basic Metabolic Panel: Recent Labs  Lab 11/21/22 1631  NA 139  K 2.4*  CL 109  CO2 22  GLUCOSE 112*  BUN 14  CREATININE 0.56  CALCIUM 7.6*  MG 1.8   GFR: Estimated Creatinine Clearance: 48.7 mL/min (by C-G formula based on SCr of 0.56 mg/dL). Liver Function Tests: Recent Labs  Lab 11/21/22 1631  AST 13*  ALT 17  ALKPHOS 96  BILITOT 0.5  PROT 6.2*  ALBUMIN 3.5   Recent Labs  Lab 11/21/22 1631  LIPASE 25   No results for input(s): "AMMONIA" in the last 168 hours. Coagulation Profile: Recent  Labs  Lab 11/21/22 1631  INR 1.2   Cardiac Enzymes: No results for input(s): "CKTOTAL", "CKMB", "CKMBINDEX", "TROPONINI" in the last 168 hours. BNP (last 3 results) No results for input(s): "PROBNP" in the last 8760 hours. HbA1C: No results for input(s): "HGBA1C" in the last 72 hours. CBG: No results for input(s): "GLUCAP" in the last 168 hours. Lipid Profile: No results for input(s): "CHOL", "HDL", "LDLCALC", "TRIG", "CHOLHDL", "LDLDIRECT" in the last 72 hours. Thyroid Function Tests: Recent Labs    11/21/22 1858  TSH 0.360  FREET4 0.91   Anemia Panel: No results for input(s): "VITAMINB12", "FOLATE", "FERRITIN", "TIBC", "IRON", "RETICCTPCT" in the last 72 hours. Urinalysis    Component Value Date/Time   COLORURINE YELLOW (A) 08/03/2021 2024   APPEARANCEUR HAZY (A) 08/03/2021 2024   APPEARANCEUR Cloudy 11/04/2013 1236   LABSPEC 1.024 08/03/2021 2024   LABSPEC 1.025 11/04/2013 1236   PHURINE 5.0 08/03/2021 2024   GLUCOSEU NEGATIVE 08/03/2021 2024   GLUCOSEU Negative 11/04/2013 1236   HGBUR NEGATIVE 08/03/2021 2024   BILIRUBINUR NEGATIVE 08/03/2021 2024   BILIRUBINUR Negative 07/16/2020 1104   BILIRUBINUR Negative 11/04/2013 1236   KETONESUR NEGATIVE 08/03/2021 2024   PROTEINUR NEGATIVE 08/03/2021 2024  UROBILINOGEN 0.2 07/16/2020 1104   NITRITE NEGATIVE 08/03/2021 2024   LEUKOCYTESUR LARGE (A) 08/03/2021 2024   LEUKOCYTESUR 3+ 11/04/2013 1236   Unresulted Labs (From admission, onward)     Start     Ordered   11/22/22 0258  Urine Drug Screen, Qualitative ([facility] only)  Once,   R        11/22/22 0257   11/22/22 0257  Urinalysis, Complete w Microscopic -Urine, Clean Catch  Once,   R       Question:  Specimen Source  Answer:  Urine, Clean Catch   11/22/22 0257   11/22/22 0133  Hemoglobin A1c  Add-on,   AD        11/22/22 0134   11/22/22 0132  HIV Antibody (routine testing w rflx)  (HIV Antibody (Routine testing w reflex) panel)  Once,   R        11/22/22 0134    11/21/22 2327  Urinalysis, Routine w reflex microscopic -Urine, Clean Catch  Once,   URGENT       Question:  Specimen Source  Answer:  Urine, Clean Catch   11/21/22 2326           Medications  sodium chloride flush (NS) 0.9 % injection 3 mL (3 mLs Intravenous Given 11/22/22 0220)  lactated ringers infusion (has no administration in time range)  acetaminophen (TYLENOL) tablet 650 mg (has no administration in time range)    Or  acetaminophen (TYLENOL) suppository 650 mg (has no administration in time range)  docusate sodium (COLACE) capsule 100 mg (100 mg Oral Not Given 11/22/22 0220)  polyethylene glycol (MIRALAX / GLYCOLAX) packet 17 g (has no administration in time range)  bisacodyl (DULCOLAX) EC tablet 5 mg (has no administration in time range)  sodium phosphate (FLEET) enema 1 enema (has no administration in time range)  ondansetron (ZOFRAN) tablet 4 mg (has no administration in time range)    Or  ondansetron (ZOFRAN) injection 4 mg (has no administration in time range)  heparin injection 5,000 Units (has no administration in time range)  atorvastatin (LIPITOR) tablet 40 mg (has no administration in time range)  benzonatate (TESSALON) capsule 100 mg (has no administration in time range)  buPROPion ER (WELLBUTRIN SR) 12 hr tablet 100 mg (has no administration in time range)  feeding supplement (ENSURE ENLIVE / ENSURE PLUS) liquid 237 mL (has no administration in time range)  fluticasone (FLONASE) 50 MCG/ACT nasal spray 1 spray (has no administration in time range)  sertraline (ZOLOFT) tablet 50 mg (has no administration in time range)  fluticasone furoate-vilanterol (BREO ELLIPTA) 200-25 MCG/ACT 1 puff (has no administration in time range)  pregabalin (LYRICA) capsule 100 mg (has no administration in time range)  pantoprazole (PROTONIX) injection 40 mg (has no administration in time range)  mirtazapine (REMERON) tablet 15 mg (has no administration in time range)  albuterol  (PROVENTIL) (2.5 MG/3ML) 0.083% nebulizer solution 2.5 mg (has no administration in time range)  sodium chloride flush (NS) 0.9 % injection 3 mL (3 mLs Intravenous Given 11/21/22 1757)  potassium chloride SA (KLOR-CON M) CR tablet 40 mEq (40 mEq Oral Given 11/21/22 1742)  potassium chloride 10 mEq in 100 mL IVPB (0 mEq Intravenous Stopped 11/21/22 2225)  iohexol (OMNIPAQUE) 350 MG/ML injection 60 mL (60 mLs Intravenous Contrast Given 11/21/22 1806)   Radiological Exams on Admission: CT ABDOMEN PELVIS W CONTRAST  Result Date: 11/21/2022 CLINICAL DATA:  Larey Seat 2 days ago, now with abdominal pain. Previous history of stomach cancer, reportedly  lung cancer also. EXAM: CT ABDOMEN AND PELVIS WITH CONTRAST TECHNIQUE: Multidetector CT imaging of the abdomen and pelvis was performed using the standard protocol following bolus administration of intravenous contrast. RADIATION DOSE REDUCTION: This exam was performed according to the departmental dose-optimization program which includes automated exposure control, adjustment of the mA and/or kV according to patient size and/or use of iterative reconstruction technique. CONTRAST:  60mL OMNIPAQUE IOHEXOL 350 MG/ML SOLN COMPARISON:  CT with IV contrast 08/03/2021, 06/11/2021. Underwent chest CTA contemporaneously today. FINDINGS: Lower chest: Lung bases are clear aside from subpleural linear atelectasis in the lower lobes. There is a subpleural bleb in the medial right base. The cardiac size is normal. Left ventricular septal wall hypertrophy are again shown. Hepatobiliary: No liver metastasis or acute abnormality are identified. Surgical absence of gallbladder again noted with mild stable intrahepatic and extrahepatic biliary prominence. Pancreas: No abnormality. Spleen: No abnormality. Adrenals/Urinary Tract: There is no adrenal mass. Small scattered right renal cysts are again noted but no mass enhancement. No follow-up imaging recommended. There is no urinary stone or  obstruction. The bladder is mildly thickened but also not fully distended. Correlate with urinalysis for possible cystitis. Stomach/Bowel: The stomach is contracted, with mild chronic thickened folds. Dilatation in the descending and horizontal duodenum is noted with an air-fluid level, and is similar to 06/11/2021 but increased since 08/03/2021. Postsurgical change of the small bowel in the left mid abdomen appears similar with the small bowel mildly dilated and enhancing in lower abdomen and pelvis measuring up to 3.2 cm. A transitional segment could not be found. An appendix is not seen. There is mild to moderate fecal stasis. Vascular/Lymphatic: There is aortoiliac atherosclerosis. No aneurysm is seen. The proximal SMA and celiac artery are both occluded with reconstitution distally. This was seen previously as well as pelvic venous congestion and pelvic phleboliths. No adenopathy is seen. Reproductive: Prior supracervical hysterectomy.  No adnexal mass. Other: Chronic paucity of body fat limiting subject contrast. Multiple pelvic surgical clips. There is no incarcerated hernia, free air, free hemorrhage or ascites. Musculoskeletal: No acute or significant osseous findings. IMPRESSION: 1. No acute posttraumatic findings are seen in the abdomen or pelvis. 2. Contracted stomach with mild chronic thickened folds. 3. Dilatation of the descending and horizontal duodenum with an air-fluid level, similar to 06/11/2021 but increased since 08/03/2021. 4. Mildly dilated and enhancing small bowel in the lower abdomen and pelvis measuring up to 3.2 cm. A transitional segment could not be found. Findings could be due to ileus/enteritis or partial obstruction. 5. Constipation. 6. Aortic and branch vessel atherosclerosis. 7. 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 8. Left ventricular and septal wall hypertrophy. 9. Chronic paucity of body fat. 10. Mild bladder thickening. Correlate  with urinalysis for possible cystitis. Aortic Atherosclerosis (ICD10-I70.0). Electronically Signed   By: Almira Bar M.D.   On: 11/21/2022 23:00   CT Angio Chest PE W and/or Wo Contrast  Result Date: 11/21/2022 CLINICAL DATA:  High probability for pulmonary embolism EXAM: CT ANGIOGRAPHY CHEST WITH CONTRAST TECHNIQUE: Multidetector CT imaging of the chest was performed using the standard protocol during bolus administration of intravenous contrast. Multiplanar CT image reconstructions and MIPs were obtained to evaluate the vascular anatomy. RADIATION DOSE REDUCTION: This exam was performed according to the departmental dose-optimization program which includes automated exposure control, adjustment of the mA and/or kV according to patient size and/or use of iterative reconstruction technique. CONTRAST:  60mL OMNIPAQUE IOHEXOL 350 MG/ML SOLN COMPARISON:  CT angiogram chest 04/09/2022 FINDINGS: Cardiovascular: Satisfactory opacification of the pulmonary arteries to the segmental level. No evidence of pulmonary embolism. Normal heart size. No pericardial effusion. There are atherosclerotic calcifications of the aorta. Mediastinum/Nodes: No enlarged mediastinal, hilar, or axillary lymph nodes. Thyroid gland, trachea, and esophagus demonstrate no significant findings. Lungs/Pleura: Mild emphysematous changes are again seen. Ovoid spiculated nodule in the right upper lobe measures 2.7 by 2.4 cm and appears similar to the prior study. Peripheral focal density in the left upper lobe measuring 1.6 x 2.9 cm also appears unchanged. There is a stable subsolid nodule in the right upper lobe measuring 6 mm, unchanged. Fissural nodule in the right upper lobe measures 4 mm image 4/55, unchanged. There is no new focal lung infiltrate, pleural effusion or pneumothorax. There some secretions in the left mainstem bronchus. Upper Abdomen: No acute abnormality. Musculoskeletal: No chest wall abnormality. Small erosive changes in  the left second rib appear stable from prior. Review of the MIP images confirms the above findings. IMPRESSION: 1. No evidence for pulmonary embolism. 2. Stable spiculated nodule in the right upper lobe. Stable focal density in the left upper lobe. Findings are concerning for malignancy. 3. Stable subsolid nodule in the right upper lobe measuring 6 mm. Aortic Atherosclerosis (ICD10-I70.0) and Emphysema (ICD10-J43.9). Electronically Signed   By: Darliss Cheney M.D.   On: 11/21/2022 21:48   CT HEAD WO CONTRAST  Result Date: 11/21/2022 CLINICAL DATA:  Fall, head and neck pain, stuttering speech and left-sided weakness EXAM: CT HEAD WITHOUT CONTRAST CT CERVICAL SPINE WITHOUT CONTRAST TECHNIQUE: Multidetector CT imaging of the head and cervical spine was performed following the standard protocol without intravenous contrast. Multiplanar CT image reconstructions of the cervical spine were also generated. RADIATION DOSE REDUCTION: This exam was performed according to the departmental dose-optimization program which includes automated exposure control, adjustment of the mA and/or kV according to patient size and/or use of iterative reconstruction technique. COMPARISON:  10/22/2018 CT head and cervical spine FINDINGS: CT HEAD FINDINGS Brain: No evidence of acute infarct, hemorrhage, mass, mass effect, or midline shift. No hydrocephalus or extra-axial fluid collection. Periventricular white matter changes, likely the sequela of chronic small vessel ischemic disease. Vascular: No hyperdense vessel. Skull: Negative for fracture or focal lesion. Sinuses/Orbits: No acute finding. Other: The mastoid air cells are well aerated. CT CERVICAL SPINE FINDINGS Alignment: No traumatic listhesis. Straightening of the normal cervical lordosis. Skull base and vertebrae: No acute fracture or suspicious osseous lesion. Soft tissues and spinal canal: No prevertebral fluid or swelling. No visible canal hematoma. Disc levels: Degenerative  changes in the cervical spine.No high-grade spinal canal stenosis. Upper chest: No focal pulmonary opacity or pleural effusion. Left-greater-than-right pleuroparenchymal scarring. IMPRESSION: 1. No acute intracranial process. 2. No acute fracture or traumatic listhesis in the cervical spine. Electronically Signed   By: Wiliam Ke M.D.   On: 11/21/2022 19:11   CT Cervical Spine Wo Contrast  Result Date: 11/21/2022 CLINICAL DATA:  Fall, head and neck pain, stuttering speech and left-sided weakness EXAM: CT HEAD WITHOUT CONTRAST CT CERVICAL SPINE WITHOUT CONTRAST TECHNIQUE: Multidetector CT imaging of the head and cervical spine was performed following the standard protocol without intravenous contrast. Multiplanar CT image reconstructions of the cervical spine were also generated. RADIATION DOSE REDUCTION: This exam was performed according to the departmental dose-optimization program which includes automated exposure control, adjustment of the mA and/or kV according to patient size and/or use of iterative reconstruction technique. COMPARISON:  10/22/2018 CT head and cervical spine  FINDINGS: CT HEAD FINDINGS Brain: No evidence of acute infarct, hemorrhage, mass, mass effect, or midline shift. No hydrocephalus or extra-axial fluid collection. Periventricular white matter changes, likely the sequela of chronic small vessel ischemic disease. Vascular: No hyperdense vessel. Skull: Negative for fracture or focal lesion. Sinuses/Orbits: No acute finding. Other: The mastoid air cells are well aerated. CT CERVICAL SPINE FINDINGS Alignment: No traumatic listhesis. Straightening of the normal cervical lordosis. Skull base and vertebrae: No acute fracture or suspicious osseous lesion. Soft tissues and spinal canal: No prevertebral fluid or swelling. No visible canal hematoma. Disc levels: Degenerative changes in the cervical spine.No high-grade spinal canal stenosis. Upper chest: No focal pulmonary opacity or pleural  effusion. Left-greater-than-right pleuroparenchymal scarring. IMPRESSION: 1. No acute intracranial process. 2. No acute fracture or traumatic listhesis in the cervical spine. Electronically Signed   By: Wiliam Ke M.D.   On: 11/21/2022 19:11    Data Reviewed: Relevant notes from primary care and specialist visits, past discharge summaries as available in EHR, including Care Everywhere. Prior diagnostic testing as pertinent to current admission diagnoses Updated medications and problem lists for reconciliation ED course, including vitals, labs, imaging, treatment and response to treatment Triage notes, nursing and pharmacy notes and ED provider's notes Notable results as noted in HPI  Assessment and Plan: * Stuttering We will obtain an MRI and MRA.  Neuro check and swallow study.  Aspiration precaution, fall precaution.  Will continue patient on her atorvastatin, patient does have an allergy to aspirin.   Left-sided weakness Resolved.  Do not suspect CVA., MRI pending.  We will follow with neuro checks.   Abdominal pain 2/2 n/v and sbo on ct seen today.  NGT if  vomiting recurs.  Currently conservative therapy, pt has been tolerating crackers and is asking for food has passed swallow.  General surgery consult if persistent or worsening abd pain.  MIVF.  Monitor electrolytes.    Protein-calorie malnutrition, severe [facility]) Dietitian consult once patient is able to take p.o and Protein supplementation with meals.   COPD (chronic obstructive pulmonary disease) (HCC) Stable no wheezing on exam patient does have a history of lung cancer and is followed by [facility] .  As needed albuterol continue Symbicort.  Essential hypertension Vitals:   11/21/22 1628 11/21/22 1930 11/21/22 2000 11/21/22 2030  BP: (!) 173/71 (!) 168/75 (!) 197/79 (!) 187/58   11/21/22 2200 11/21/22 2230  BP: (!) 157/54 (!) 140/113  Start patient on hydralazine IV and resume p.o. blood pressure medications once  patient is able to take p.o. meds.  Home blood pressure meds include lisinopril and HCTZ.   Abnormal EKG  Repeat EKG / Troponin. Pt has allergies to aspirin.  Cont statin.  No anginal or anginal equivalent symptoms .   Cystitis -Urinalysis is pending and ordered.  - rocephin or appropriate abx once results available.   Prognosis: Fair   DVT prophylaxis:  Heparin   Consults:  None   Advance Care Planning:    Code Status: Full Code   Family Communication:  None pt lives alone   Disposition Plan:  TBD   Severity of Illness: The appropriate patient status for this patient is INPATIENT. Inpatient status is judged to be reasonable and necessary in order to provide the required intensity of service to ensure the patient's safety. The patient's presenting symptoms, physical exam findings, and initial radiographic and laboratory data in the context of their chronic comorbidities is felt to place them at high risk for further clinical deterioration.  Furthermore, it is not anticipated that the patient will be medically stable for discharge from the hospital within 2 midnights of admission.   * I certify that at the point of admission it is my clinical judgment that the patient will require inpatient hospital care spanning beyond 2 midnights from the point of admission due to high intensity of service, high risk for further deterioration and high frequency of surveillance required.*  Author: Gertha Calkin, MD 11/22/2022 2:58 AM  For on call review [email or web address].

## 2022-11-21 NOTE — Assessment & Plan Note (Signed)
Resolved

## 2022-11-21 NOTE — ED Notes (Signed)
Pt soiled the bed again and she was cleaned up. Sample gathered in case of send off. She states that she has no awareness of it happening until it's too late.

## 2022-11-21 NOTE — ED Notes (Signed)
Pt taken to CT and then will come to ED 17

## 2022-11-21 NOTE — Assessment & Plan Note (Addendum)
We will obtain an MRI and MRA.  Neuro check and swallow study.  Aspiration precaution, fall precaution.

## 2022-11-21 NOTE — ED Notes (Signed)
Pt put on bedpan. Brief was changed after and bed pad was put under.

## 2022-11-21 NOTE — H&P (Incomplete)
History and Physical    Patient: [name] [name] ZOX:[id number] DOB: 05-01-1955 DOA: 11/21/2022 DOS: the patient was seen and examined on 11/21/2022 PCP: Corky Downs, MD  Patient coming from: Home  Chief Complaint:  Chief Complaint  Patient presents with  . Abdominal Pain  . Fall    HPI: [name] [name] is a 67 y.o. female with medical history significant for essential hypertension, hyperlipidemia, major depression, lung cancer, COPD coming for multiple complaints of and pain and falls.    Patient comes to [place] for abdominal pain, fall on Saturday, left-sided weakness and stuttering of his speech on Saturday.  Per report stuttering has been going on and off for the past 5 days or so.  Left-sided weakness has been going on for about a month.  No trauma to head or no striking his head.  No report of loss of consciousness.  Per report patient also has been having diarrhea.  In emergency room vitals trend shows: Vitals:   11/21/22 2000 11/21/22 2030 11/21/22 2200 11/21/22 2230  BP: (!) 197/79 (!) 187/58 (!) 157/54 (!) 140/113  Pulse: 89 97 86 92  Temp:      Resp: 19 (!) 21 17 (!) 22  Height:      Weight:      SpO2: 95% 98% 95% 97%  BMI (Calculated):      Lab work today shows hypokalemia of 2.4 glucose 112 normal kidney function normal liver function, white count of 10.7 anemia of 11.5 MCV of 100.6 normal platelets, initial troponin of 28 and repeat of 42.  Alcohol level [name] than 10. 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 EKG today shows sinus rhythm at 79 with biventricular hypertrophy, PR 138 QTc of 470, question septal infarct, lateral infarct with T wave inversions in 2 3 aVF and lateral leads which is different than his previous EKG in March of this year.  EKG does look like ST elevations which is noted LVH and was discussed by EDMD with cardiology on-call. Most recent 2D echocardiogram was in September 2022 showing: 1. Left ventricular ejection fraction, by estimation, is 55 to 60%. The  left ventricle has normal function. The left ventricle has no regional  wall motion abnormalities. There is moderate left ventricular hypertrophy.  Left ventricular diastolic  parameters are consistent with Grade I diastolic dysfunction (impaired  relaxation). The average left ventricular global longitudinal strain is  -17.7 %. The global longitudinal strain is normal.   2. Right ventricular systolic function is normal. The right ventricular  size is normal. There is normal pulmonary artery systolic pressure. The  estimated right ventricular systolic pressure is 31.0 mmHg.   3. The mitral valve is normal in structure. Mild mitral valve  regurgitation.   In the ED pt received: Medications  sodium chloride flush (NS) 0.9 % injection 3 mL (3 mLs Intravenous Given 11/21/22 1757)  potassium chloride SA (KLOR-CON M) CR tablet 40 mEq (40 mEq Oral Given 11/21/22 1742)  potassium chloride 10 mEq in 100 mL IVPB (0 mEq Intravenous Stopped 11/21/22 2225)  iohexol (OMNIPAQUE) 350 MG/ML injection 60 mL (60 mLs Intravenous Contrast Given 11/21/22 1806)   Review of Systems  Gastrointestinal:  Positive for abdominal pain, diarrhea and nausea.  Musculoskeletal:  Positive for falls.  Neurological:  Positive for speech change and weakness.   Past Medical History:  Diagnosis Date  .  Bowel obstruction (HCC)    x1 month ago  . Cancer (HCC)    stomach cancer per pt and currently lung ca  . COPD (chronic obstructive pulmonary disease) (HCC)   . Essential hypertension 12/02/2019  . Lung cancer (HCC)   . Personal history of radiation therapy 2019-2020   lung ca   Past Surgical History:  Procedure Laterality Date  . COLON SURGERY      reports that she has been smoking cigarettes. She has never used smokeless tobacco. She reports current alcohol use. She reports that she does not use drugs.  Allergies  Allergen Reactions  . Aspirin Hives and Itching   Family History  Problem Relation Age of Onset  . Lupus Sister   . Heart failure Neg Hx   . Diabetes Neg Hx    Prior to Admission medications   Medication Sig Start Date End Date Taking? Authorizing Provider  acetaminophen (TYLENOL) 160 MG/5ML solution Take 5 mLs (160 mg total) by mouth every 6 (six) hours as needed for mild pain. 09/17/19   Corky Downs, MD  albuterol (PROVENTIL) (2.5 MG/3ML) 0.083% nebulizer solution Take 3 mLs (2.5 mg total) by nebulization 3 (three) times daily as needed for wheezing. 06/29/21   Corky Downs, MD  albuterol (VENTOLIN HFA) 108 (90 Base) MCG/ACT inhaler Inhale 1 puff into the lungs 2 (two) times daily as needed. 08/31/21   Corky Downs, MD  atorvastatin (LIPITOR) 40 MG tablet Take 40 mg by mouth daily.    [provider]  benzonatate (TESSALON) 100 MG capsule TAKE 2 CAPSULES BY MOUTH 3 TIMES DAILY AS NEEDED FOR COUGH 06/22/21   Corky Downs, MD  buPROPion (WELLBUTRIN SR) 100 MG 12 hr tablet Take 1 tablet (100 mg total) by mouth 2 (two) times daily. 09/17/19   Corky Downs, MD  cholestyramine Lanetta Inch) 4 GM/DOSE powder TAKE 1 SCOOPFUL(4 GRAMS) MIXED IN 4-8 OUNCES OF LIQUID AND DRINK, DAILY 10/25/21   Corky Downs, MD  cyanocobalamin (VITAMIN B12) 250 MCG tablet Take 1 tablet (250 mcg total) by mouth daily. 08/31/21   Corky Downs, MD  cycloSPORINE (RESTASIS) 0.05 % ophthalmic  emulsion Place 1 drop into both eyes every 12 (twelve) hours.    [provider]  dicyclomine (BENTYL) 10 MG capsule Take 1 capsule (10 mg total) by mouth 4 (four) times daily -  before meals and at bedtime for 3 days. 08/03/21 08/06/21  Gilles Chiquito, MD  feeding supplement, ENSURE ENLIVE, (ENSURE ENLIVE) LIQD Take 237 mLs by mouth 2 (two) times daily between meals. 09/10/16   Katharina Caper, MD  fluticasone (FLONASE) 50 MCG/ACT nasal spray USE 1 SPRAY IN EACH NOSTRIL 3 TIMES DAILY 01/12/22   Corky Downs, MD  hydrochlorothiazide (MICROZIDE) 12.5 MG capsule Take 12.5 mg by mouth daily.    [provider]  hydrOXYzine (ATARAX/VISTARIL) 25 MG tablet Take 25 mg by mouth 3 (three) times daily as needed.    [provider]  lipase/protease/amylase (CREON) 12000-38000 units CPEP capsule Take by mouth.    [provider]  lisinopril (ZESTRIL) 40 MG tablet Take 1 tablet (  40 mg total) by mouth daily. 12/08/20   Corky Downs, MD  mirtazapine (REMERON) 15 MG tablet Take 15 mg by mouth daily.    [provider]  omeprazole (PRILOSEC) 20 MG capsule TAKE 1 CAPSULE BY MOUTH TWICE DAILY 07/20/20   Corky Downs, MD  ondansetron (ZOFRAN) 4 MG tablet Take 1 tablet (4 mg total) by mouth every 8 (eight) hours as needed for up to 10 doses for nausea or vomiting. 08/03/21   Gilles Chiquito, MD  oxyCODONE-acetaminophen (PERCOCET) 5-325 MG tablet Take 1 tablet by mouth every 4 (four) hours as needed for severe pain. 04/09/22 04/09/23  Willy Eddy, MD  pantoprazole (PROTONIX) 40 MG tablet TAKE 1 TABLET BY MOUTH ONCE DAILY 01/12/22   Corky Downs, MD  pregabalin (LYRICA) 100 MG capsule Take 100 mg by mouth 3 (three) times daily.     [provider]  QUEtiapine (SEROQUEL) 100 MG tablet Take 100 mg by mouth at bedtime. 04/06/21   [provider]  sertraline (ZOLOFT) 50 MG tablet TAKE 1 TABLET BY MOUTH TWICE DAILY 01/12/22   Corky Downs, MD  SYMBICORT 160-4.5  MCG/ACT inhaler INHALE 2 PUFFS TWICE A DAY RINSE MOUTH WITH WATER AFTER EACH USE 01/12/22   Corky Downs, MD  Vitamin D, Ergocalciferol, (DRISDOL) 1.25 MG (50000 UNIT) CAPS capsule TAKE 1 CAPSULE BY MOUTH WEEKLY 09/02/21   Corky Downs, MD   Vitals:   11/21/22 2000 11/21/22 2030 11/21/22 2200 11/21/22 2230  BP: (!) 197/79 (!) 187/58 (!) 157/54 (!) 140/113  Pulse: 89 97 86 92  Resp: 19 (!) 21 17 (!) 22  Temp:      SpO2: 95% 98% 95% 97%  Weight:      Height:       Physical Exam Vitals and nursing note reviewed.  Constitutional:      General: She is not in acute distress. HENT:     Head: Normocephalic and atraumatic.     Right Ear: Hearing normal.     Left Ear: Hearing normal.     Nose: Nose normal. No nasal deformity.     Mouth/Throat:     Lips: Pink.     Tongue: No lesions.     Pharynx: Oropharynx is clear.  Eyes:     General: Lids are normal.     Extraocular Movements: Extraocular movements intact.  Cardiovascular:     Rate and Rhythm: Normal rate and regular rhythm.     Heart sounds: Normal heart sounds.  Pulmonary:     Effort: Pulmonary effort is normal.     Breath sounds: Normal breath sounds.  Abdominal:     General: Bowel sounds are normal. There is no distension.     Palpations: Abdomen is soft. There is no mass.     Tenderness: There is no abdominal tenderness.  Musculoskeletal:     Right lower leg: No edema.     Left lower leg: No edema.  Skin:    General: Skin is warm.  Neurological:     General: No focal deficit present.     Mental Status: She is alert and oriented to person, place, and time.     Cranial Nerves: Cranial nerves 2-12 are intact.  Psychiatric:        Attention and Perception: Attention normal.        Mood and Affect: Mood normal.        Speech: Speech normal.        Behavior: Behavior normal. Behavior is cooperative.  Labs on Admission: I have personally reviewed following labs and imaging studies Results for orders placed or performed  during the hospital encounter of 11/21/22 (from the past 24 hour(s))  Protime-INR     Status: Abnormal   Collection Time: 11/21/22  4:31 PM  Result Value Ref Range   Prothrombin Time 15.5 (H) 11.4 - 15.2 seconds   INR 1.2 0.8 - 1.2  APTT     Status: None   Collection Time: 11/21/22  4:31 PM  Result Value Ref Range   aPTT 31 24 - 36 seconds  CBC     Status: Abnormal   Collection Time: 11/21/22  4:31 PM  Result Value Ref Range   WBC 10.7 (H) 4.0 - 10.5 K/uL   RBC 3.38 (L) 3.87 - 5.11 MIL/uL   Hemoglobin 11.5 (L) 12.0 - 15.0 g/dL   HCT 65.7 (L) 84.6 - 96.2 %   MCV 100.6 (H) 80.0 - 100.0 fL   MCH 34.0 26.0 - 34.0 pg   MCHC 33.8 30.0 - 36.0 g/dL   RDW 95.2 84.1 - 32.4 %   Platelets 254 150 - 400 K/uL   nRBC 0.0 0.0 - 0.2 %  Differential     Status: Abnormal   Collection Time: 11/21/22  4:31 PM  Result Value Ref Range   Neutrophils Relative % 80 %   Neutro Abs 8.6 (H) 1.7 - 7.7 K/uL   Lymphocytes Relative 14 %   Lymphs Abs 1.5 0.7 - 4.0 K/uL   Monocytes Relative 6 %   Monocytes Absolute 0.6 0.1 - 1.0 K/uL   Eosinophils Relative 0 %   Eosinophils Absolute 0.0 0.0 - 0.5 K/uL   Basophils Relative 0 %   Basophils Absolute 0.0 0.0 - 0.1 K/uL   Immature Granulocytes 0 %   Abs Immature Granulocytes 0.03 0.00 - 0.07 K/uL  Comprehensive metabolic panel     Status: Abnormal   Collection Time: 11/21/22  4:31 PM  Result Value Ref Range   Sodium 139 135 - 145 mmol/L   Potassium 2.4 (LL) 3.5 - 5.1 mmol/L   Chloride 109 98 - 111 mmol/L   CO2 22 22 - 32 mmol/L   Glucose, Bld 112 (H) 70 - 99 mg/dL   BUN 14 8 - 23 mg/dL   Creatinine, Ser 4.01 0.44 - 1.00 mg/dL   Calcium 7.6 (L) 8.9 - 10.3 mg/dL   Total Protein 6.2 (L) 6.5 - 8.1 g/dL   Albumin 3.5 3.5 - 5.0 g/dL   AST 13 (L) 15 - 41 U/L   ALT 17 0 - 44 U/L   Alkaline Phosphatase 96 38 - 126 U/L   Total Bilirubin 0.5 0.3 - 1.2 mg/dL   GFR, Estimated >02 >72 mL/min   Anion gap 8 5 - 15  Ethanol     Status: None   Collection Time:  11/21/22  4:31 PM  Result Value Ref Range   Alcohol, Ethyl (B) <10 <10 mg/dL  Lipase, blood     Status: None   Collection Time: 11/21/22  4:31 PM  Result Value Ref Range   Lipase 25 11 - 51 U/L  Troponin I (High Sensitivity)     Status: Abnormal   Collection Time: 11/21/22  4:31 PM  Result Value Ref Range   Troponin I (High Sensitivity) 28 (H) <18 ng/L  Magnesium     Status: None   Collection Time: 11/21/22  4:31 PM  Result Value Ref Range   Magnesium 1.8 1.7 - 2.4 mg/dL  Troponin I (High Sensitivity)     Status: Abnormal   Collection Time: 11/21/22  6:59 PM  Result Value Ref Range   Troponin I (High Sensitivity) 42 (H) <18 ng/L   CBC: Recent Labs  Lab 11/21/22 1631  WBC 10.7*  NEUTROABS 8.6*  HGB 11.5*  HCT 34.0*  MCV 100.6*  PLT 254   Basic Metabolic Panel: Recent Labs  Lab 11/21/22 1631  NA 139  K 2.4*  CL 109  CO2 22  GLUCOSE 112*  BUN 14  CREATININE 0.56  CALCIUM 7.6*  MG 1.8   GFR: Estimated Creatinine Clearance: 48.7 mL/min (by C-G formula based on SCr of 0.56 mg/dL). Liver Function Tests: Recent Labs  Lab 11/21/22 1631  AST 13*  ALT 17  ALKPHOS 96  BILITOT 0.5  PROT 6.2*  ALBUMIN 3.5   Recent Labs  Lab 11/21/22 1631  LIPASE 25   No results for input(s): "AMMONIA" in the last 168 hours. Coagulation Profile: Recent Labs  Lab 11/21/22 1631  INR 1.2   Cardiac Enzymes: No results for input(s): "CKTOTAL", "CKMB", "CKMBINDEX", "TROPONINI" in the last 168 hours. BNP (last 3 results) No results for input(s): "PROBNP" in the last 8760 hours. HbA1C: No results for input(s): "HGBA1C" in the last 72 hours. CBG: No results for input(s): "GLUCAP" in the last 168 hours. Lipid Profile: No results for input(s): "CHOL", "HDL", "LDLCALC", "TRIG", "CHOLHDL", "LDLDIRECT" in the last 72 hours. Thyroid Function Tests: No results for input(s): "TSH", "T4TOTAL", "FREET4", "T3FREE", "THYROIDAB" in the last 72 hours. Anemia Panel: No results for  input(s): "VITAMINB12", "FOLATE", "FERRITIN", "TIBC", "IRON", "RETICCTPCT" in the last 72 hours. Urinalysis    Component Value Date/Time   COLORURINE YELLOW (A) 08/03/2021 2024   APPEARANCEUR HAZY (A) 08/03/2021 2024   APPEARANCEUR Cloudy 11/04/2013 1236   LABSPEC 1.024 08/03/2021 2024   LABSPEC 1.025 11/04/2013 1236   PHURINE 5.0 08/03/2021 2024   GLUCOSEU NEGATIVE 08/03/2021 2024   GLUCOSEU Negative 11/04/2013 1236   HGBUR NEGATIVE 08/03/2021 2024   BILIRUBINUR NEGATIVE 08/03/2021 2024   BILIRUBINUR Negative 07/16/2020 1104   BILIRUBINUR Negative 11/04/2013 1236   KETONESUR NEGATIVE 08/03/2021 2024   PROTEINUR NEGATIVE 08/03/2021 2024   UROBILINOGEN 0.2 07/16/2020 1104   NITRITE NEGATIVE 08/03/2021 2024   LEUKOCYTESUR LARGE (A) 08/03/2021 2024   LEUKOCYTESUR 3+ 11/04/2013 1236   Unresulted Labs (From admission, onward)     Start     Ordered   11/21/22 2327  Urinalysis, Routine w reflex microscopic -Urine, Clean Catch  Once,   URGENT       Question:  Specimen Source  Answer:  Urine, Clean Catch   11/21/22 2326   11/21/22 2311  Gastrointestinal Panel by PCR , Stool  (Gastrointestinal Panel by PCR, Stool                                                                                                                                                     **  Does Not include CLOSTRIDIUM DIFFICILE testing. **If CDIFF testing is needed, place order from the "C Difficile Testing" order set.**)  Once,   URGENT        11/21/22 2310   11/21/22 2311  C Difficile Quick Screen w PCR reflex  (C Difficile quick screen w PCR reflex panel )  Once, for 24 hours,   URGENT       References:    CDiff Information Tool   11/21/22 2310           Medications  sodium chloride flush (NS) 0.9 % injection 3 mL (3 mLs Intravenous Given 11/21/22 1757)  potassium chloride SA (KLOR-CON M) CR tablet 40 mEq (40 mEq Oral Given 11/21/22 1742)  potassium chloride 10 mEq in 100 mL IVPB (0 mEq Intravenous Stopped  11/21/22 2225)  iohexol (OMNIPAQUE) 350 MG/ML injection 60 mL (60 mLs Intravenous Contrast Given 11/21/22 1806)   Radiological Exams on Admission: CT ABDOMEN PELVIS W CONTRAST  Result Date: 11/21/2022 CLINICAL DATA:  Larey Seat 2 days ago, now with abdominal pain. Previous history of stomach cancer, reportedly lung cancer also. EXAM: CT ABDOMEN AND PELVIS WITH CONTRAST TECHNIQUE: Multidetector CT imaging of the abdomen and pelvis was performed using the standard protocol following bolus administration of intravenous contrast. RADIATION DOSE REDUCTION: This exam was performed according to the departmental dose-optimization program which includes automated exposure control, adjustment of the mA and/or kV according to patient size and/or use of iterative reconstruction technique. CONTRAST:  60mL OMNIPAQUE IOHEXOL 350 MG/ML SOLN COMPARISON:  CT with IV contrast 08/03/2021, 06/11/2021. Underwent chest CTA contemporaneously today. FINDINGS: Lower chest: Lung bases are clear aside from subpleural linear atelectasis in the lower lobes. There is a subpleural bleb in the medial right base. The cardiac size is normal. Left ventricular septal wall hypertrophy are again shown. Hepatobiliary: No liver metastasis or acute abnormality are identified. Surgical absence of gallbladder again noted with mild stable intrahepatic and extrahepatic biliary prominence. Pancreas: No abnormality. Spleen: No abnormality. Adrenals/Urinary Tract: There is no adrenal mass. Small scattered right renal cysts are again noted but no mass enhancement. No follow-up imaging recommended. There is no urinary stone or obstruction. The bladder is mildly thickened but also not fully distended. Correlate with urinalysis for possible cystitis. Stomach/Bowel: The stomach is contracted, with mild chronic thickened folds. Dilatation in the descending and horizontal duodenum is noted with an air-fluid level, and is similar to 06/11/2021 but increased since  08/03/2021. Postsurgical change of the small bowel in the left mid abdomen appears similar with the small bowel mildly dilated and enhancing in lower abdomen and pelvis measuring up to 3.2 cm. A transitional segment could not be found. An appendix is not seen. There is mild to moderate fecal stasis. Vascular/Lymphatic: There is aortoiliac atherosclerosis. No aneurysm is seen. The proximal SMA and celiac artery are both occluded with reconstitution distally. This was seen previously as well as pelvic venous congestion and pelvic phleboliths. No adenopathy is seen. Reproductive: Prior supracervical hysterectomy.  No adnexal mass. Other: Chronic paucity of body fat limiting subject contrast. Multiple pelvic surgical clips. There is no incarcerated hernia, free air, free hemorrhage or ascites. Musculoskeletal: No acute or significant osseous findings. IMPRESSION: 1. No acute posttraumatic findings are seen in the abdomen or pelvis. 2. Contracted stomach with mild chronic thickened folds. 3. Dilatation of the descending and horizontal duodenum with an air-fluid level, similar to 06/11/2021 but increased since 08/03/2021. 4. Mildly dilated and enhancing small bowel in the lower abdomen and pelvis  measuring up to 3.2 cm. A transitional segment could not be found. Findings could be due to ileus/enteritis or partial obstruction. 5. Constipation. 6. Aortic and branch vessel atherosclerosis. 7. 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 8. Left ventricular and septal wall hypertrophy. 9. Chronic paucity of body fat. 10. Mild bladder thickening. Correlate with urinalysis for possible cystitis. Aortic Atherosclerosis (ICD10-I70.0). Electronically Signed   By: Almira Bar M.D.   On: 11/21/2022 23:00   CT Angio Chest PE W and/or Wo Contrast  Result Date: 11/21/2022 CLINICAL DATA:  High probability for pulmonary embolism EXAM: CT ANGIOGRAPHY CHEST WITH CONTRAST TECHNIQUE:  Multidetector CT imaging of the chest was performed using the standard protocol during bolus administration of intravenous contrast. Multiplanar CT image reconstructions and MIPs were obtained to evaluate the vascular anatomy. RADIATION DOSE REDUCTION: This exam was performed according to the departmental dose-optimization program which includes automated exposure control, adjustment of the mA and/or kV according to patient size and/or use of iterative reconstruction technique. CONTRAST:  60mL OMNIPAQUE IOHEXOL 350 MG/ML SOLN COMPARISON:  CT angiogram chest 04/09/2022 FINDINGS: Cardiovascular: Satisfactory opacification of the pulmonary arteries to the segmental level. No evidence of pulmonary embolism. Normal heart size. No pericardial effusion. There are atherosclerotic calcifications of the aorta. Mediastinum/Nodes: No enlarged mediastinal, hilar, or axillary lymph nodes. Thyroid gland, trachea, and esophagus demonstrate no significant findings. Lungs/Pleura: Mild emphysematous changes are again seen. Ovoid spiculated nodule in the right upper lobe measures 2.7 by 2.4 cm and appears similar to the prior study. Peripheral focal density in the left upper lobe measuring 1.6 x 2.9 cm also appears unchanged. There is a stable subsolid nodule in the right upper lobe measuring 6 mm, unchanged. Fissural nodule in the right upper lobe measures 4 mm image 4/55, unchanged. There is no new focal lung infiltrate, pleural effusion or pneumothorax. There some secretions in the left mainstem bronchus. Upper Abdomen: No acute abnormality. Musculoskeletal: No chest wall abnormality. Small erosive changes in the left second rib appear stable from prior. Review of the MIP images confirms the above findings. IMPRESSION: 1. No evidence for pulmonary embolism. 2. Stable spiculated nodule in the right upper lobe. Stable focal density in the left upper lobe. Findings are concerning for malignancy. 3. Stable subsolid nodule in the right  upper lobe measuring 6 mm. Aortic Atherosclerosis (ICD10-I70.0) and Emphysema (ICD10-J43.9). Electronically Signed   By: Darliss Cheney M.D.   On: 11/21/2022 21:48   CT HEAD WO CONTRAST  Result Date: 11/21/2022 CLINICAL DATA:  Fall, head and neck pain, stuttering speech and left-sided weakness EXAM: CT HEAD WITHOUT CONTRAST CT CERVICAL SPINE WITHOUT CONTRAST TECHNIQUE: Multidetector CT imaging of the head and cervical spine was performed following the standard protocol without intravenous contrast. Multiplanar CT image reconstructions of the cervical spine were also generated. RADIATION DOSE REDUCTION: This exam was performed according to the departmental dose-optimization program which includes automated exposure control, adjustment of the mA and/or kV according to patient size and/or use of iterative reconstruction technique. COMPARISON:  10/22/2018 CT head and cervical spine FINDINGS: CT HEAD FINDINGS Brain: No evidence of acute infarct, hemorrhage, mass, mass effect, or midline shift. No hydrocephalus or extra-axial fluid collection. Periventricular white matter changes, likely the sequela of chronic small vessel ischemic disease. Vascular: No hyperdense vessel. Skull: Negative for fracture or focal lesion. Sinuses/Orbits: No acute finding. Other: The mastoid air cells are well aerated. CT CERVICAL SPINE FINDINGS Alignment: No traumatic listhesis. Straightening of the normal cervical lordosis.  Skull base and vertebrae: No acute fracture or suspicious osseous lesion. Soft tissues and spinal canal: No prevertebral fluid or swelling. No visible canal hematoma. Disc levels: Degenerative changes in the cervical spine.No high-grade spinal canal stenosis. Upper chest: No focal pulmonary opacity or pleural effusion. Left-greater-than-right pleuroparenchymal scarring. IMPRESSION: 1. No acute intracranial process. 2. No acute fracture or traumatic listhesis in the cervical spine. Electronically Signed   By: Wiliam Ke M.D.   On: 11/21/2022 19:11   CT Cervical Spine Wo Contrast  Result Date: 11/21/2022 CLINICAL DATA:  Fall, head and neck pain, stuttering speech and left-sided weakness EXAM: CT HEAD WITHOUT CONTRAST CT CERVICAL SPINE WITHOUT CONTRAST TECHNIQUE: Multidetector CT imaging of the head and cervical spine was performed following the standard protocol without intravenous contrast. Multiplanar CT image reconstructions of the cervical spine were also generated. RADIATION DOSE REDUCTION: This exam was performed according to the departmental dose-optimization program which includes automated exposure control, adjustment of the mA and/or kV according to patient size and/or use of iterative reconstruction technique. COMPARISON:  10/22/2018 CT head and cervical spine FINDINGS: CT HEAD FINDINGS Brain: No evidence of acute infarct, hemorrhage, mass, mass effect, or midline shift. No hydrocephalus or extra-axial fluid collection. Periventricular white matter changes, likely the sequela of chronic small vessel ischemic disease. Vascular: No hyperdense vessel. Skull: Negative for fracture or focal lesion. Sinuses/Orbits: No acute finding. Other: The mastoid air cells are well aerated. CT CERVICAL SPINE FINDINGS Alignment: No traumatic listhesis. Straightening of the normal cervical lordosis. Skull base and vertebrae: No acute fracture or suspicious osseous lesion. Soft tissues and spinal canal: No prevertebral fluid or swelling. No visible canal hematoma. Disc levels: Degenerative changes in the cervical spine.No high-grade spinal canal stenosis. Upper chest: No focal pulmonary opacity or pleural effusion. Left-greater-than-right pleuroparenchymal scarring. IMPRESSION: 1. No acute intracranial process. 2. No acute fracture or traumatic listhesis in the cervical spine. Electronically Signed   By: Wiliam Ke M.D.   On: 11/21/2022 19:11    Data Reviewed: Relevant notes from primary care and specialist visits, past  discharge summaries as available in EHR, including Care Everywhere. Prior diagnostic testing as pertinent to current admission diagnoses Updated medications and problem lists for reconciliation ED course, including vitals, labs, imaging, treatment and response to treatment Triage notes, nursing and pharmacy notes and ED provider's notes Notable results as noted in HPI  Assessment and Plan: No notes have been filed under this hospital service. Service: Hospitalist       Prognosis: ***  DVT prophylaxis:  ***  Consults:  ***  Advance Care Planning:    Code Status: Prior   Family Communication:  ***  Disposition Plan:  ***  Severity of Illness: {Observation/Inpatient:21159}  Author: Gertha Calkin, MD 11/21/2022 11:35 PM  For on call review [email or web address].

## 2022-11-21 NOTE — ED Provider Notes (Signed)
[facility] Provider Note    Event Date/Time   First MD Initiated Contact with Patient 11/21/22 1648     (approximate)   History   Abdominal Pain and Fall   HPI  [name] [name] is a 67 y.o. female with history of lung cancer who comes in with concern for abdominal pain.  Patient reportedly has some stuttering speech and left-sided weakness that started on Saturday.  To me patient reports that she has had stuttering speech off and on for 5 days but over the past 3 days has been pretty constant.  She also reports left-sided weakness that has been ongoing for about a month but seem to get worse started on Saturday.  She does report a fall on Saturday.  Did not hit her head.  She reports having some epigastric abdominal pain since then as well as some shortness of breath and pain with taking a deep breath.   Physical Exam   Triage Vital Signs: ED Triage Vitals  Encounter Vitals Group     BP 11/21/22 1628 (!) 173/71     Systolic BP Percentile --      Diastolic BP Percentile --      Pulse Rate 11/21/22 1628 86     Resp 11/21/22 1628 20     Temp 11/21/22 1629 99 F (37.2 C)     Temp src --      SpO2 11/21/22 1628 95 %     Weight 11/21/22 1628 97 lb (44 kg)     Height 11/21/22 1628 5\' 5"  (1.651 m)     Head Circumference --      Peak Flow --      Pain Score 11/21/22 1628 6     Pain Loc --      Pain Education --      Exclude from Growth Chart --     Most recent vital signs: Vitals:   11/21/22 1628 11/21/22 1629  BP: (!) 173/71   Pulse: 86   Resp: 20   Temp:  99 F (37.2 C)  SpO2: 95%      General: Awake, no distress.  CV:  Good peripheral perfusion.  Resp:  Normal effort.  Abd:  No distention.  She is tender in her abdomen Other:  Patient has stuttering noted with weakness on her left arm and leg   ED Results / Procedures / Treatments   Labs (all labs ordered are listed, but only abnormal results are displayed) Labs Reviewed  CBC -  Abnormal; Notable for the following components:      Result Value   WBC 10.7 (*)    RBC 3.38 (*)    Hemoglobin 11.5 (*)    HCT 34.0 (*)    MCV 100.6 (*)    All other components within normal limits  DIFFERENTIAL - Abnormal; Notable for the following components:   Neutro Abs 8.6 (*)    All other components within normal limits  PROTIME-INR  APTT  COMPREHENSIVE METABOLIC PANEL  ETHANOL  LIPASE, BLOOD  I-STAT CREATININE, ED  CBG MONITORING, ED  TROPONIN I (HIGH SENSITIVITY)     EKG  My interpretation of EKG:  EKG is sinus rate of 79 with some ST elevation in V2 V3 with some depressions in the inferior lateral leads with T wave inversions, normal intervals  RADIOLOGY I have reviewed the CT personally interpreted no evidence of intracranial hemorrhage  PROCEDURES:  Critical Care performed: No  Procedures   MEDICATIONS ORDERED  IN ED: Medications  potassium chloride 10 mEq in 100 mL IVPB (0 mEq Intravenous Stopped 11/21/22 2007)  sodium chloride flush (NS) 0.9 % injection 3 mL (3 mLs Intravenous Given 11/21/22 1757)  potassium chloride SA (KLOR-CON M) CR tablet 40 mEq (40 mEq Oral Given 11/21/22 1742)  iohexol (OMNIPAQUE) 350 MG/ML injection 60 mL (60 mLs Intravenous Contrast Given 11/21/22 1806)     IMPRESSION / MDM / ASSESSMENT AND PLAN / ED COURSE  I reviewed the triage vital signs and the nursing notes.   Patient's presentation is most consistent with acute presentation with potential threat to life or bodily function.   Patient comes in with multiple concerns differential includes intracranial hemorrhage, cervical fracture, PE, acute abdominal pathology  Case was discussed with Dr. Kirke Corin by Dr. Rosalia Hammers and did not meet STEMI criteria.  Thought to be more likely LVH.  Labs show elevated troponins 28-42.  Hemoglobin is stable.  Potassium is low at 2.4 patient to be some IV and oral repletion.  Discussed with patient and she is aware of her lung cancer.  She states  that she supposed to follow-up with oncology to see if it still in remission.  Discussed with her my concern that it still looks concerning on CT imaging and she expressed understanding.  She does report some diarrhea and I will send off stool studies given her CT is concerning for enteritis.  I will discuss the hospital team for admission I do feel like she will need MRI.  However when I did reevaluate her her leg weakness seem to be much improved and she was able to lift it up off the bed and her left arm was much stronger.  She also no longer had a stutter.  The patient is on the cardiac monitor to evaluate for evidence of arrhythmia and/or significant heart rate changes.      FINAL CLINICAL IMPRESSION(S) / ED DIAGNOSES   Final diagnoses:  Hypokalemia  Abnormal EKG  Malignant neoplasm of lung, unspecified laterality, unspecified part of lung (HCC)     Rx / DC Orders   ED Discharge Orders     None        Note:  This document was prepared using Dragon voice recognition software and may include unintentional dictation errors.   Concha Se, MD 11/21/22 2325

## 2022-11-21 NOTE — ED Notes (Signed)
Pt had called out for assistance.  She had soiled the whole bed.  Pt was changed and cleaned up, gown given and new brief placed on her.

## 2022-11-22 ENCOUNTER — Observation Stay: Payer: 59

## 2022-11-22 ENCOUNTER — Encounter: Payer: Self-pay | Admitting: Internal Medicine

## 2022-11-22 DIAGNOSIS — E43 Unspecified severe protein-calorie malnutrition: Secondary | ICD-10-CM

## 2022-11-22 DIAGNOSIS — I10 Essential (primary) hypertension: Secondary | ICD-10-CM

## 2022-11-22 DIAGNOSIS — E876 Hypokalemia: Secondary | ICD-10-CM

## 2022-11-22 DIAGNOSIS — R531 Weakness: Secondary | ICD-10-CM | POA: Diagnosis not present

## 2022-11-22 DIAGNOSIS — C3490 Malignant neoplasm of unspecified part of unspecified bronchus or lung: Secondary | ICD-10-CM | POA: Diagnosis not present

## 2022-11-22 DIAGNOSIS — N3090 Cystitis, unspecified without hematuria: Secondary | ICD-10-CM | POA: Diagnosis present

## 2022-11-22 DIAGNOSIS — E878 Other disorders of electrolyte and fluid balance, not elsewhere classified: Secondary | ICD-10-CM | POA: Insufficient documentation

## 2022-11-22 DIAGNOSIS — R9431 Abnormal electrocardiogram [ECG] [EKG]: Secondary | ICD-10-CM | POA: Diagnosis not present

## 2022-11-22 DIAGNOSIS — J410 Simple chronic bronchitis: Secondary | ICD-10-CM

## 2022-11-22 DIAGNOSIS — F8081 Childhood onset fluency disorder: Secondary | ICD-10-CM | POA: Diagnosis not present

## 2022-11-22 DIAGNOSIS — I6782 Cerebral ischemia: Secondary | ICD-10-CM | POA: Diagnosis not present

## 2022-11-22 DIAGNOSIS — R55 Syncope and collapse: Secondary | ICD-10-CM | POA: Diagnosis not present

## 2022-11-22 LAB — CBC
HCT: 33.6 % — ABNORMAL LOW (ref 36.0–46.0)
Hemoglobin: 11.2 g/dL — ABNORMAL LOW (ref 12.0–15.0)
MCH: 33.4 pg (ref 26.0–34.0)
MCHC: 33.3 g/dL (ref 30.0–36.0)
MCV: 100.3 fL — ABNORMAL HIGH (ref 80.0–100.0)
Platelets: 234 10*3/uL (ref 150–400)
RBC: 3.35 MIL/uL — ABNORMAL LOW (ref 3.87–5.11)
RDW: 12.4 % (ref 11.5–15.5)
WBC: 11.9 10*3/uL — ABNORMAL HIGH (ref 4.0–10.5)
nRBC: 0 % (ref 0.0–0.2)

## 2022-11-22 LAB — C DIFFICILE QUICK SCREEN W PCR REFLEX
C Diff antigen: NEGATIVE
C Diff interpretation: NOT DETECTED
C Diff toxin: NEGATIVE

## 2022-11-22 LAB — HIV ANTIBODY (ROUTINE TESTING W REFLEX): HIV Screen 4th Generation wRfx: NONREACTIVE

## 2022-11-22 LAB — GASTROINTESTINAL PANEL BY PCR, STOOL (REPLACES STOOL CULTURE)

## 2022-11-22 LAB — BASIC METABOLIC PANEL
Anion gap: 5 (ref 5–15)
BUN: 13 mg/dL (ref 8–23)
CO2: 24 mmol/L (ref 22–32)
Calcium: 7.4 mg/dL — ABNORMAL LOW (ref 8.9–10.3)
Chloride: 110 mmol/L (ref 98–111)
Creatinine, Ser: 0.37 mg/dL — ABNORMAL LOW (ref 0.44–1.00)
GFR, Estimated: >60 mL/min (ref >60)
Glucose, Bld: 113 mg/dL — ABNORMAL HIGH (ref 70–99)
Potassium: 2.6 mmol/L — CL (ref 3.5–5.1)
Sodium: 139 mmol/L (ref 135–145)

## 2022-11-22 LAB — URINE DRUG SCREEN, QUALITATIVE (ARMC ONLY)
Amphetamines, Ur Screen: NOT DETECTED
Barbiturates, Ur Screen: NOT DETECTED
Benzodiazepine, Ur Scrn: NOT DETECTED
Cannabinoid 50 Ng, Ur ~~LOC~~: NOT DETECTED
Cocaine Metabolite,Ur ~~LOC~~: POSITIVE — AB
MDMA (Ecstasy)Ur Screen: NOT DETECTED
Methadone Scn, Ur: NOT DETECTED
Opiate, Ur Screen: NOT DETECTED
Phencyclidine (PCP) Ur S: NOT DETECTED
Tricyclic, Ur Screen: NOT DETECTED

## 2022-11-22 LAB — URINALYSIS, COMPLETE (UACMP) WITH MICROSCOPIC
Bilirubin Urine: NEGATIVE
Glucose, UA: NEGATIVE mg/dL
Ketones, ur: NEGATIVE mg/dL
Nitrite: POSITIVE — AB
Protein, ur: 30 mg/dL — AB
Specific Gravity, Urine: 1.016 (ref 1.005–1.030)
WBC, UA: >50 WBC/hpf (ref 0–5)
pH: 6 (ref 5.0–8.0)

## 2022-11-22 LAB — HEMOGLOBIN A1C
Hgb A1c MFr Bld: 5 % (ref 4.8–5.6)
Mean Plasma Glucose: 96.8 mg/dL

## 2022-11-22 LAB — T4, FREE: Free T4: 0.91 ng/dL (ref 0.61–1.12)

## 2022-11-22 LAB — TROPONIN I (HIGH SENSITIVITY)
Troponin I (High Sensitivity): 32 ng/L — ABNORMAL HIGH (ref <18)
Troponin I (High Sensitivity): 26 ng/L — ABNORMAL HIGH (ref <18)

## 2022-11-22 LAB — PHOSPHORUS: Phosphorus: 1.9 mg/dL — ABNORMAL LOW (ref 2.5–4.6)

## 2022-11-22 LAB — TSH: TSH: 0.36 u[IU]/mL (ref 0.350–4.500)

## 2022-11-22 LAB — MAGNESIUM: Magnesium: 1.7 mg/dL (ref 1.7–2.4)

## 2022-11-22 MED ORDER — POTASSIUM CHLORIDE CRYS ER 20 MEQ PO TBCR: 40.0000 meq | EXTENDED_RELEASE_TABLET | Freq: Once | ORAL | Status: DC

## 2022-11-22 MED ORDER — PANTOPRAZOLE SODIUM 40 MG IV SOLR
40.0000 mg | Freq: Two times a day (BID) | INTRAVENOUS | Status: DC
  Administered 2022-11-22 – 2022-11-23 (×3): 40 mg via INTRAVENOUS
  Filled 2022-11-22 (×3): qty 10

## 2022-11-22 MED ORDER — SODIUM CHLORIDE 0.9% FLUSH
3.0000 mL | Freq: Two times a day (BID) | INTRAVENOUS | Status: DC
  Administered 2022-11-22 – 2022-11-23 (×3): 3 mL via INTRAVENOUS

## 2022-11-22 MED ORDER — BISACODYL 5 MG PO TBEC: 5.0000 mg | DELAYED_RELEASE_TABLET | Freq: Every day | ORAL | Status: DC | PRN

## 2022-11-22 MED ORDER — ALBUTEROL SULFATE (2.5 MG/3ML) 0.083% IN NEBU: 2.5000 mg | INHALATION_SOLUTION | RESPIRATORY_TRACT | Status: DC | PRN

## 2022-11-22 MED ORDER — HEPARIN SODIUM (PORCINE) 5000 UNIT/ML IJ SOLN
5000.0000 [IU] | Freq: Two times a day (BID) | INTRAMUSCULAR | Status: DC
Start: 2022-11-22 — End: 2022-11-23
  Administered 2022-11-22 – 2022-11-23 (×3): 5000 [IU] via SUBCUTANEOUS
  Filled 2022-11-22 (×3): qty 1

## 2022-11-22 MED ORDER — LACTATED RINGERS IV SOLN
INTRAVENOUS | Status: AC
Start: 2022-11-22 — End: 2022-11-23

## 2022-11-22 MED ORDER — POLYETHYLENE GLYCOL 3350 17 G PO PACK: 17.0000 g | PACK | Freq: Every day | ORAL | Status: DC | PRN

## 2022-11-22 MED ORDER — POTASSIUM CHLORIDE CRYS ER 20 MEQ PO TBCR
40.0000 meq | EXTENDED_RELEASE_TABLET | ORAL | Status: AC
  Administered 2022-11-22 (×2): 40 meq via ORAL
  Filled 2022-11-22 (×2): qty 2

## 2022-11-22 MED ORDER — SODIUM CHLORIDE 0.9 % IV SOLN
1.0000 g | INTRAVENOUS | Status: DC
  Administered 2022-11-22: 1 g via INTRAVENOUS
  Filled 2022-11-22 (×2): qty 10

## 2022-11-22 MED ORDER — ACETAMINOPHEN 650 MG RE SUPP: 650.0000 mg | Freq: Four times a day (QID) | RECTAL | Status: DC | PRN

## 2022-11-22 MED ORDER — MAGNESIUM SULFATE 2 GM/50ML IV SOLN
2.0000 g | Freq: Once | INTRAVENOUS | Status: AC
  Administered 2022-11-22: 2 g via INTRAVENOUS
  Filled 2022-11-22: qty 50

## 2022-11-22 MED ORDER — BUPROPION HCL ER (SR) 100 MG PO TB12
100.0000 mg | ORAL_TABLET | Freq: Two times a day (BID) | ORAL | Status: DC
  Administered 2022-11-22 – 2022-11-23 (×3): 100 mg via ORAL
  Filled 2022-11-22 (×5): qty 1

## 2022-11-22 MED ORDER — LISINOPRIL 20 MG PO TABS
40.0000 mg | ORAL_TABLET | Freq: Every day | ORAL | Status: DC
  Administered 2022-11-22 – 2022-11-23 (×2): 40 mg via ORAL
  Filled 2022-11-22: qty 2
  Filled 2022-11-22: qty 4

## 2022-11-22 MED ORDER — FLEET ENEMA RE ENEM: 1.0000 | ENEMA | Freq: Once | RECTAL | Status: DC | PRN

## 2022-11-22 MED ORDER — FLUTICASONE FUROATE-VILANTEROL 200-25 MCG/ACT IN AEPB: 1.0000 | INHALATION_SPRAY | Freq: Every day | RESPIRATORY_TRACT | Status: DC

## 2022-11-22 MED ORDER — PREGABALIN 50 MG PO CAPS
100.0000 mg | ORAL_CAPSULE | Freq: Three times a day (TID) | ORAL | Status: DC
Start: 2022-11-22 — End: 2022-11-23
  Administered 2022-11-22 – 2022-11-23 (×4): 100 mg via ORAL
  Filled 2022-11-22 (×4): qty 2

## 2022-11-22 MED ORDER — DOCUSATE SODIUM 100 MG PO CAPS
100.0000 mg | ORAL_CAPSULE | Freq: Two times a day (BID) | ORAL | Status: DC
  Administered 2022-11-22 – 2022-11-23 (×2): 100 mg via ORAL
  Filled 2022-11-22 (×3): qty 1

## 2022-11-22 MED ORDER — ENSURE ENLIVE PO LIQD
237.0000 mL | Freq: Two times a day (BID) | ORAL | Status: DC
Start: 2022-11-22 — End: 2022-11-23
  Administered 2022-11-22 – 2022-11-23 (×3): 237 mL via ORAL

## 2022-11-22 MED ORDER — ACETAMINOPHEN 325 MG PO TABS
650.0000 mg | ORAL_TABLET | Freq: Four times a day (QID) | ORAL | Status: DC | PRN
  Administered 2022-11-22 (×2): 650 mg via ORAL
  Filled 2022-11-22 (×3): qty 2

## 2022-11-22 MED ORDER — ATORVASTATIN CALCIUM 20 MG PO TABS
40.0000 mg | ORAL_TABLET | Freq: Every day | ORAL | Status: DC
  Administered 2022-11-22 – 2022-11-23 (×2): 40 mg via ORAL
  Filled 2022-11-22 (×2): qty 2

## 2022-11-22 MED ORDER — K PHOS MONO-SOD PHOS DI & MONO 155-852-130 MG PO TABS
500.0000 mg | ORAL_TABLET | Freq: Two times a day (BID) | ORAL | Status: AC
  Administered 2022-11-22 (×2): 500 mg via ORAL
  Filled 2022-11-22 (×3): qty 2

## 2022-11-22 MED ORDER — ONDANSETRON HCL 4 MG PO TABS: 4.0000 mg | ORAL_TABLET | Freq: Four times a day (QID) | ORAL | Status: DC | PRN

## 2022-11-22 MED ORDER — MIRTAZAPINE 15 MG PO TABS
15.0000 mg | ORAL_TABLET | Freq: Every day | ORAL | Status: DC
  Administered 2022-11-22: 15 mg via ORAL
  Filled 2022-11-22: qty 1

## 2022-11-22 MED ORDER — SERTRALINE HCL 50 MG PO TABS
50.0000 mg | ORAL_TABLET | Freq: Two times a day (BID) | ORAL | Status: DC
  Administered 2022-11-23: 50 mg via ORAL
  Filled 2022-11-22: qty 1

## 2022-11-22 MED ORDER — BENZONATATE 100 MG PO CAPS: 100.0000 mg | ORAL_CAPSULE | Freq: Three times a day (TID) | ORAL | Status: DC | PRN

## 2022-11-22 MED ORDER — FLUTICASONE PROPIONATE 50 MCG/ACT NA SUSP: 1.0000 | Freq: Every day | NASAL | Status: DC | PRN

## 2022-11-22 MED ORDER — ONDANSETRON HCL 4 MG/2ML IJ SOLN: 4.0000 mg | Freq: Four times a day (QID) | INTRAMUSCULAR | Status: DC | PRN

## 2022-11-22 MED ORDER — ALBUTEROL SULFATE HFA 108 (90 BASE) MCG/ACT IN AERS: 1.0000 | INHALATION_SPRAY | RESPIRATORY_TRACT | Status: DC | PRN

## 2022-11-22 NOTE — ED Notes (Signed)
Patient transported to MRI 

## 2022-11-22 NOTE — ED Notes (Signed)
This tech helped pt go to the bathroom. Pt ambulated well with walker.

## 2022-11-22 NOTE — Consult Note (Signed)
PHARMACY CONSULT NOTE - ELECTROLYTES  Pharmacy Consult for Electrolyte Monitoring and Replacement   Recent Labs: Height: 5\' 5"  (165.1 cm) Weight: 44 kg (97 lb) IBW/kg (Calculated) : 57 Estimated Creatinine Clearance: 48.7 mL/min (A) (by C-G formula based on SCr of 0.37 mg/dL (L)). Potassium (mmol/L)  Date Value  11/22/2022 2.6 (LL)  11/04/2013 3.1 (L)   Magnesium (mg/dL)  Date Value  16/10/9602 1.7   Calcium (mg/dL)  Date Value  54/09/8117 7.4 (L)   Calcium, Total (mg/dL)  Date Value  14/78/2956 8.5   Albumin (g/dL)  Date Value  21/30/8657 3.5  11/04/2013 3.3 (L)   Phosphorus (mg/dL)  Date Value  84/69/6295 1.9 (L)   Sodium (mmol/L)  Date Value  11/22/2022 139  11/04/2013 143    Assessment  [name] [name] is a 67 y.o. female presenting with multiple falls. PMH significant for HTN, HLD, lung cancer. Pharmacy has been consulted to monitor and replace electrolytes.  Diet: PO, Ensure MIVF: LR @ 50 mL/hr Pertinent medications: N/A  Goal of Therapy: Electrolytes WNL  Plan:  Updated 10/29 @ 1145: K 2.6 >> will order additional potassium chloride 40 mEq PO x 2 Mg 1.7 >> will order magnesium chloride 2 g IV x 1 Phos 1.9 >> K-Phos 500 mg PO twice daily ordered by provider (also provides ~22 mEq of potassium per dose) BMP, Mg, Phos again with AM labs  Thank you for allowing pharmacy to be a part of this patient's care.  Will M. Dareen Piano, PharmD Clinical Pharmacist 11/22/2022 11:44 AM

## 2022-11-22 NOTE — Assessment & Plan Note (Addendum)
Blood pressure mildly elevated. On amlodipine, HCTZ and lisinopril at home. -Restarting home lisinopril-will will add later agents as needed

## 2022-11-22 NOTE — Progress Notes (Signed)
Per Dr Nelson Chimes, dc NIH/stroke orders

## 2022-11-22 NOTE — Consult Note (Signed)
PHARMACY CONSULT NOTE - ELECTROLYTES  Pharmacy Consult for Electrolyte Monitoring and Replacement   Recent Labs: Height: 5\' 5"  (165.1 cm) Weight: 44 kg (97 lb) IBW/kg (Calculated) : 57 Estimated Creatinine Clearance: 48.7 mL/min (by C-G formula based on SCr of 0.56 mg/dL). Potassium (mmol/L)  Date Value  11/21/2022 2.4 (LL)  11/04/2013 3.1 (L)   Magnesium (mg/dL)  Date Value  16/10/9602 1.7   Calcium (mg/dL)  Date Value  54/09/8117 7.6 (L)   Calcium, Total (mg/dL)  Date Value  14/78/2956 8.5   Albumin (g/dL)  Date Value  21/30/8657 3.5  11/04/2013 3.3 (L)   Phosphorus (mg/dL)  Date Value  84/69/6295 1.9 (L)   Sodium (mmol/L)  Date Value  11/21/2022 139  11/04/2013 143    Assessment  [name] [name] is a 67 y.o. female presenting with multiple falls. PMH significant for HTN, HLD, lung cancer. Pharmacy has been consulted to monitor and replace electrolytes.  Diet: PO, Ensure MIVF: LR @ 50 mL/hr Pertinent medications: N/A  Goal of Therapy: Electrolytes WNL  Plan:  K 2.4 >> potassium chloride 10 mEq IV x 2 and 40 mEq PO already ordered by provider Will order additional potassium chloride 40 mEq PO  Mg 1.7 >> will order magnesium chloride 2 g IV x 1 Phos 1.9 >> K-Phos 500 mg PO twice daily ordered by provider (also provides ~22 mEq K per dose) New BMP later today per provider and will order BMP, Mg, Phos again with AM labs  Thank you for allowing pharmacy to be a part of this patient's care.  Will M. Dareen Piano, PharmD Clinical Pharmacist 11/22/2022 8:53 AM

## 2022-11-22 NOTE — ED Notes (Signed)
Pt moved from room 17 to room 30 via ed stretcher, belongings brought with pt, as well as the pt's cane, pt finished her breakfast prior to me moving her to room 30 and ate 100% of her meal. No distress noted and pt is alert and oriented, vss

## 2022-11-22 NOTE — Assessment & Plan Note (Signed)
Dietitian consult once patient is able to take p.o and Protein supplementation with meals.

## 2022-11-22 NOTE — Assessment & Plan Note (Signed)
Stable no wheezing on exam patient does have a history of lung cancer and is followed by [facility] .  As needed albuterol continue Symbicort.

## 2022-11-22 NOTE — Assessment & Plan Note (Signed)
Patient was found to have significant hypokalemia, mild hypomagnesemia and hypophosphatemia. -Replete electrolyte and monitor

## 2022-11-22 NOTE — Progress Notes (Signed)
Progress Note   Patient: [name] [name] ZOX:[id number] DOB: September 20, 1955 DOA: 11/21/2022     0 DOS: the patient was seen and examined on 11/22/2022   Brief hospital course: Taken from H&P.  [name] [name] is a 67 y.o. female with medical history significant for essential hypertension, hyperlipidemia, major depression, lung cancer, COPD coming for multiple complaints of and pain and falls.   Patient with complaint of abdominal pain, fall on Saturday, some intermittent left-sided weakness going on for about a month, no head trauma, no loss of consciousness.  Also having some intermittent diarrhea.  She has an history abdominal surgery for her stomach cancer for which she follows up with her doctor at [facility].  On presentation she has elevated blood pressure at 173/71, labs pertinent for hypokalemia 2.4, mild leukocytosis at 10.7, hemoglobin 11.5 MCV 100.6, initial troponin 28>>42, CT head and cervical spine was negative for any acute intracranial process or fractures.  CT abdomen and pelvis with no acute posttraumatic findings, did show dilatation of descending and horizontal duodenum with an air-fluid level, similar to the prior study done in May 2023 but slightly increased in size since July 2023.  Also shows a mildly dilated and enhancing small bowel in the lower abdomen, no transition point, can be due to mild ileitis/enteritis or partial obstruction.  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  10/29: Vital stable, mild hypophosphatemia at 1.9, hypomagnesemia, potassium 2.6, slight worsening of leukocytosis,  MRI brain was negative for any acute abnormality.  UDS positive for cocaine and UA positive for nitrites, leukocytes and bacteria, starting on ceftriaxone and urine cultures were sent. C. difficile colitis and GI pathogen panel negative.  Continue to have some diarrhea. PT  evaluation ordered for generalized weakness.    Assessment and Plan: * Stuttering MRI brain was negative for any acute abnormality, UDS positive for cocaine, might be some intermittent spasms secondary to cocaine but no stroke.  Will continue patient on her atorvastatin, patient does have an allergy to aspirin.   Left-sided weakness Resolved.  Likely secondary to cocaine use Do not suspect CVA., MRI negative for any any acute abnormality We will follow with neuro checks.   Abdominal pain Continue to have diarrhea, no nausea or vomiting now. C. difficile and GI pathogen panel negative. UDS positive for cocaine.  CT abdomen with concern of partial SBO or mild ileitis. -Supportive care   Protein-calorie malnutrition, severe [facility]) Dietitian consult once patient is able to take p.o and Protein supplementation with meals.   COPD (chronic obstructive pulmonary disease) (HCC) Stable no wheezing on exam patient does have a history of lung cancer and is followed by [facility] .  As needed albuterol continue Symbicort.  Essential hypertension Blood pressure mildly elevated. On amlodipine, HCTZ and lisinopril at home. -Restarting home lisinopril-will will add later agents as needed  Electrolyte abnormality Patient was found to have significant hypokalemia, mild hypomagnesemia and hypophosphatemia. -Replete electrolyte and monitor  Abnormal EKG Patient has some lateral T wave inversion but that seems chronic.  History of cocaine use.  Barely positive troponin with a flat curve. Pt has allergies to aspirin.  Cont statin.  No anginal or anginal equivalent symptoms .   Cystitis Patient has some dysuria.  UA concerning for UTI with positive nitrites, leukocytes and bacteria. -Urine cultures ordered -Start her on ceftriaxone -Monitor urine culture      Subjective: Patient continued to have some  diarrhea, having mild dysuria.  No more nausea or vomiting.  Physical Exam: Vitals:    11/22/22 1130 11/22/22 1200 11/22/22 1230 11/22/22 1245  BP: (!) 141/45 (!) 145/42 (!) 146/48   Pulse: (!) 59 62 60   Resp: 16 14 10    Temp:    98.3 F (36.8 C)  TempSrc:    Oral  SpO2: 100% 100% 100%   Weight:      Height:       General.  Emaciated lady, in no acute distress. Pulmonary.  Lungs clear bilaterally, normal respiratory effort. CV.  Regular rate and rhythm, no JVD, rub or murmur. Abdomen.  Soft, nontender, nondistended, BS positive. CNS.  Alert and oriented .  No focal neurologic deficit. Extremities.  No edema, no cyanosis, pulses intact and symmetrical. Psychiatry.  Judgment and insight appears normal.   Data Reviewed: Prior data reviewed  Family Communication: Discussed with patient  Disposition: Status is: Observation The patient will require care spanning > 2 midnights and should be moved to inpatient because: Severity of illness  Planned Discharge Destination: Home  DVT prophylaxis.  Subcu heparin Time spent: 50  minutes  This record has been created using Conservation officer, historic buildings. Errors have been sought and corrected,but may not always be located. Such creation errors do not reflect on the standard of care.   Author: Arnetha Courser, MD 11/22/2022 12:55 PM  For on call review [email or web address].

## 2022-11-22 NOTE — Assessment & Plan Note (Addendum)
Patient has some dysuria.  UA concerning for UTI with positive nitrites, leukocytes and bacteria. -Urine cultures ordered -Start her on ceftriaxone -Monitor urine culture

## 2022-11-22 NOTE — Assessment & Plan Note (Addendum)
Continue to have diarrhea, no nausea or vomiting now. C. difficile and GI pathogen panel negative. UDS positive for cocaine.  CT abdomen with concern of partial SBO or mild ileitis. -Supportive care

## 2022-11-22 NOTE — Hospital Course (Addendum)
Taken from H&P.  [name] [name] is a 67 y.o. female with medical history significant for essential hypertension, hyperlipidemia, major depression, lung cancer, COPD coming for multiple complaints of and pain and falls.   Patient with complaint of abdominal pain, fall on Saturday, some intermittent left-sided weakness going on for about a month, no head trauma, no loss of consciousness.  Also having some intermittent diarrhea.  She has an history abdominal surgery for her stomach cancer for which she follows up with her doctor at [facility].  On presentation she has elevated blood pressure at 173/71, labs pertinent for hypokalemia 2.4, mild leukocytosis at 10.7, hemoglobin 11.5 MCV 100.6, initial troponin 28>>42, CT head and cervical spine was negative for any acute intracranial process or fractures.  CT abdomen and pelvis with no acute posttraumatic findings, did show dilatation of descending and horizontal duodenum with an air-fluid level, similar to the prior study done in May 2023 but slightly increased in size since July 2023.  Also shows a mildly dilated and enhancing small bowel in the lower abdomen, no transition point, can be due to mild ileitis/enteritis or partial obstruction.  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  10/29: Vital stable, mild hypophosphatemia at 1.9, hypomagnesemia, potassium 2.6, slight worsening of leukocytosis,  MRI brain was negative for any acute abnormality.  UDS positive for cocaine and UA positive for nitrites, leukocytes and bacteria, starting on ceftriaxone and urine cultures were sent. C. difficile colitis and GI pathogen panel negative.  Continue to have some diarrhea. PT evaluation ordered for generalized weakness.  10/30: Remained hemodynamically stable.  Urine cultures are still pending but significant improvement in dysuria.  Physical therapy  is recommended home health which was ordered. Patient was counseled again for cocaine abuse as her symptoms might be related to the cerebral vessel spasms or secondary to cocaine use. Patient will need continuation of counseling against substance abuse.  She has been given 4 more days of Keflex for UTI.  Need to follow-up with primary care provider and they can follow-up her urine culture results.  She will continue on current medications and follow-up with her providers for further assistance.

## 2022-11-22 NOTE — Assessment & Plan Note (Addendum)
Patient has some lateral T wave inversion but that seems chronic.  History of cocaine use.  Barely positive troponin with a flat curve. Pt has allergies to aspirin.  Cont statin.  No anginal or anginal equivalent symptoms .

## 2022-11-23 DIAGNOSIS — R1084 Generalized abdominal pain: Secondary | ICD-10-CM | POA: Diagnosis not present

## 2022-11-23 DIAGNOSIS — Z681 Body mass index (BMI) 19 or less, adult: Secondary | ICD-10-CM | POA: Diagnosis not present

## 2022-11-23 DIAGNOSIS — Z923 Personal history of irradiation: Secondary | ICD-10-CM | POA: Diagnosis not present

## 2022-11-23 DIAGNOSIS — Z85118 Personal history of other malignant neoplasm of bronchus and lung: Secondary | ICD-10-CM | POA: Diagnosis not present

## 2022-11-23 DIAGNOSIS — E785 Hyperlipidemia, unspecified: Secondary | ICD-10-CM | POA: Diagnosis present

## 2022-11-23 DIAGNOSIS — N3090 Cystitis, unspecified without hematuria: Secondary | ICD-10-CM | POA: Diagnosis present

## 2022-11-23 DIAGNOSIS — Z8269 Family history of other diseases of the musculoskeletal system and connective tissue: Secondary | ICD-10-CM | POA: Diagnosis not present

## 2022-11-23 DIAGNOSIS — K529 Noninfective gastroenteritis and colitis, unspecified: Secondary | ICD-10-CM | POA: Diagnosis present

## 2022-11-23 DIAGNOSIS — M25562 Pain in left knee: Secondary | ICD-10-CM | POA: Diagnosis present

## 2022-11-23 DIAGNOSIS — F1410 Cocaine abuse, uncomplicated: Secondary | ICD-10-CM | POA: Diagnosis present

## 2022-11-23 DIAGNOSIS — K56600 Partial intestinal obstruction, unspecified as to cause: Secondary | ICD-10-CM | POA: Diagnosis present

## 2022-11-23 DIAGNOSIS — R4789 Other speech disturbances: Secondary | ICD-10-CM | POA: Diagnosis present

## 2022-11-23 DIAGNOSIS — F8081 Childhood onset fluency disorder: Secondary | ICD-10-CM | POA: Diagnosis not present

## 2022-11-23 DIAGNOSIS — F329 Major depressive disorder, single episode, unspecified: Secondary | ICD-10-CM | POA: Diagnosis present

## 2022-11-23 DIAGNOSIS — Z886 Allergy status to analgesic agent status: Secondary | ICD-10-CM | POA: Diagnosis not present

## 2022-11-23 DIAGNOSIS — Z7951 Long term (current) use of inhaled steroids: Secondary | ICD-10-CM | POA: Diagnosis not present

## 2022-11-23 DIAGNOSIS — W19XXXA Unspecified fall, initial encounter: Secondary | ICD-10-CM | POA: Diagnosis present

## 2022-11-23 DIAGNOSIS — I10 Essential (primary) hypertension: Secondary | ICD-10-CM | POA: Diagnosis present

## 2022-11-23 DIAGNOSIS — J449 Chronic obstructive pulmonary disease, unspecified: Secondary | ICD-10-CM | POA: Diagnosis present

## 2022-11-23 DIAGNOSIS — F17210 Nicotine dependence, cigarettes, uncomplicated: Secondary | ICD-10-CM | POA: Diagnosis present

## 2022-11-23 DIAGNOSIS — R9431 Abnormal electrocardiogram [ECG] [EKG]: Secondary | ICD-10-CM | POA: Diagnosis present

## 2022-11-23 DIAGNOSIS — E876 Hypokalemia: Secondary | ICD-10-CM | POA: Diagnosis present

## 2022-11-23 DIAGNOSIS — R531 Weakness: Secondary | ICD-10-CM | POA: Diagnosis not present

## 2022-11-23 DIAGNOSIS — J410 Simple chronic bronchitis: Secondary | ICD-10-CM | POA: Diagnosis not present

## 2022-11-23 DIAGNOSIS — E878 Other disorders of electrolyte and fluid balance, not elsewhere classified: Secondary | ICD-10-CM | POA: Diagnosis not present

## 2022-11-23 DIAGNOSIS — E43 Unspecified severe protein-calorie malnutrition: Secondary | ICD-10-CM | POA: Diagnosis present

## 2022-11-23 DIAGNOSIS — E440 Moderate protein-calorie malnutrition: Secondary | ICD-10-CM | POA: Insufficient documentation

## 2022-11-23 DIAGNOSIS — Z85028 Personal history of other malignant neoplasm of stomach: Secondary | ICD-10-CM | POA: Diagnosis not present

## 2022-11-23 DIAGNOSIS — T405X1A Poisoning by cocaine, accidental (unintentional), initial encounter: Secondary | ICD-10-CM | POA: Diagnosis present

## 2022-11-23 LAB — BASIC METABOLIC PANEL
Anion gap: 5 (ref 5–15)
BUN: 12 mg/dL (ref 8–23)
CO2: 23 mmol/L (ref 22–32)
Calcium: 7.7 mg/dL — ABNORMAL LOW (ref 8.9–10.3)
Chloride: 111 mmol/L (ref 98–111)
Creatinine, Ser: 0.69 mg/dL (ref 0.44–1.00)
GFR, Estimated: >60 mL/min (ref >60)
Glucose, Bld: 97 mg/dL (ref 70–99)
Potassium: 3.8 mmol/L (ref 3.5–5.1)
Sodium: 139 mmol/L (ref 135–145)

## 2022-11-23 LAB — PHOSPHORUS: Phosphorus: 2 mg/dL — ABNORMAL LOW (ref 2.5–4.6)

## 2022-11-23 LAB — MAGNESIUM: Magnesium: 2.3 mg/dL (ref 1.7–2.4)

## 2022-11-23 MED ORDER — ENSURE ENLIVE PO LIQD
237.0000 mL | Freq: Three times a day (TID) | ORAL | Status: DC
Start: 2022-11-23 — End: 2022-11-23

## 2022-11-23 MED ORDER — CEPHALEXIN 500 MG PO CAPS: 500.0000 mg | ORAL_CAPSULE | Freq: Three times a day (TID) | ORAL | 0 refills | Status: AC

## 2022-11-23 MED ORDER — BISACODYL 5 MG PO TBEC: 5.0000 mg | DELAYED_RELEASE_TABLET | Freq: Every day | ORAL | 0 refills | Status: AC | PRN

## 2022-11-23 MED ORDER — K PHOS MONO-SOD PHOS DI & MONO 155-852-130 MG PO TABS
500.0000 mg | ORAL_TABLET | Freq: Once | ORAL | Status: AC
  Administered 2022-11-23: 500 mg via ORAL
  Filled 2022-11-23: qty 2

## 2022-11-23 MED ORDER — POLYETHYLENE GLYCOL 3350 17 G PO PACK: 17.0000 g | PACK | Freq: Every day | ORAL | 0 refills | Status: AC | PRN

## 2022-11-23 MED ORDER — DOCUSATE SODIUM 100 MG PO CAPS: 100.0000 mg | ORAL_CAPSULE | Freq: Two times a day (BID) | ORAL | 0 refills | Status: AC

## 2022-11-23 NOTE — Plan of Care (Signed)
  Problem: Education: Goal: Knowledge of condition and prescribed therapy will improve Outcome: Progressing   Problem: Cardiac: Goal: Will achieve and/or maintain adequate cardiac output Outcome: Progressing   Problem: Physical Regulation: Goal: Complications related to the disease process, condition or treatment will be avoided or minimized Outcome: Progressing   Problem: Nutrition: Goal: Adequate nutrition will be maintained Outcome: Progressing

## 2022-11-23 NOTE — Progress Notes (Signed)
Physical Therapy Evaluation Patient Details Name: [name] [name] MRN: [id number] DOB: Dec 19, 1955 Today's Date: 11/23/2022  History of Present Illness  [name] [name] is a 67 y.o. female with medical history significant for essential hypertension, hyperlipidemia, major depression, lung cancer, COPD coming for multiple complaints of and abdominal pain, falls, and with intermittent left sided weakness. MRI brain was negative for acute abnormality.   Clinical Impression  Patient seated EOB with nursing present in room, patient pleasantly agreeable to PT evaluation. Per patient reports, prior to admission patient was Mod I with mobility and ADLs. Was using a SPC for household distances and Rollator for community distances, does endorse falls in the past six months and decreased activity tolerance over the past few months. Upon evaluation, patient presents with decreased strength noted on LLE and decreased gross motor control. Patient require Min A for transfers, bed mobility due to LLE weakness this date. Patient was able to ambulate with RW approx 40' with rest break required due to fatigue after approx 20'. Slow gait speed, narrow BOS and general unsteadiness noted with ambulation. Patient left in bed with bed alarm set and all needs in reach. RN notified of mobility status. Patient will benefit from skilled acute PT services to address functional impairments (see below for additional) and maximize functional mobility. Anticipate the need for follow up PT services upon acute hospital discharge. Will continue to follow acutely.        If plan is discharge home, recommend the following: A little help with walking and/or transfers;A little help with bathing/dressing/bathroom;Assist for transportation;Help with stairs or ramp for entrance   Can travel by private vehicle        Equipment Recommendations Other (comment) (TBD)  Recommendations for Other Services       Functional Status Assessment  Patient has had a recent decline in their functional status and demonstrates the ability to make significant improvements in function in a reasonable and predictable amount of time.     Precautions / Restrictions Precautions Precautions: Fall Restrictions Weight Bearing Restrictions: No      Mobility  Bed Mobility Overal bed mobility: Needs Assistance Bed Mobility: Sit to Supine       Sit to supine: Min assist   General bed mobility comments: assist required for LLE to get into bed; use of bed rails, HOB elevated    Transfers Overall transfer level: Needs assistance Equipment used: Rolling walker (2 wheels) Transfers: Sit to/from Stand Sit to Stand: Contact guard assist, Min assist           General transfer comment: patient able to stand from EOB with CGA on initial rep; with increasing fatigue on second rep after ambulation, patietn require Min A from lower surface height. Cues for hand placement.    Ambulation/Gait Ambulation/Gait assistance: Contact guard assist Gait Distance (Feet): 40 Feet (total; two bouts of 20 ft with short rest break required between) Assistive device: Rolling walker (2 wheels) Gait Pattern/deviations: Decreased step length - right, Decreased step length - left, Narrow base of support Gait velocity: decreased     General Gait Details: Pt with overall narrow BOS with ambulation, short step length and slow gait speed noted with use of RW. Patient with forward flexed posture. Pt able to ambulate 40 ft total with short rest break after approx 20 ft. Unable to tolerate additional distance due to fatigue.  Stairs            Wheelchair Mobility     Tilt Bed  Modified Rankin (Stroke Patients Only)       Balance Overall balance assessment: Needs assistance Sitting-balance support: Feet supported, No upper extremity supported Sitting balance-Leahy Scale: Good     Standing balance support: Bilateral upper extremity supported,  During functional activity Standing balance-Leahy Scale: Fair Standing balance comment: mild unsteadiness, reliant on AD for balance. No overt LOB noted                             Pertinent Vitals/Pain Pain Assessment Pain Assessment: 0-10 Pain Score: 5  Pain Location: L Abdomen/Side Pain Descriptors / Indicators: Aching Pain Intervention(s): Limited activity within patient's tolerance, Monitored during session    Home Living Family/patient expects to be discharged to:: Private residence Living Arrangements: Alone Available Help at Discharge: Other (Comment);Available PRN/intermittently (Brother can assist PRN; stays with patient at times) Type of Home: House Home Access: Stairs to enter Entrance Stairs-Rails: Right (ascending) Entrance Stairs-Number of Steps: 2   Home Layout: One level Home Equipment: Rollator (4 wheels);Cane - single point;Shower seat      Prior Function Prior Level of Function : Independent/Modified Independent             Mobility Comments: Pt reports she is Mod I with mobility. Use of SPC for indoor ambulation, community ambulation use of Rollator. ADLs Comments: Reports Mod I with ADLs; typically showers but recently sponge baths due to no hot water.     Extremity/Trunk Assessment   Upper Extremity Assessment Upper Extremity Assessment: Defer to OT evaluation    Lower Extremity Assessment Lower Extremity Assessment: RLE deficits/detail;LLE deficits/detail RLE Deficits / Details: 4+/5 on MMT RLE Coordination: WNL LLE Deficits / Details: deficits noted in muscle strength, coordination. 3/5 on LLE LLE Coordination: decreased gross motor       Communication   Communication Communication: No apparent difficulties  Cognition Arousal: Alert Behavior During Therapy: WFL for tasks assessed/performed Overall Cognitive Status: Within Functional Limits for tasks assessed                                 General Comments:  A&O during session        General Comments      Exercises Other Exercises Other Exercises: Educated on PT role, importance of OOB mobility, and discharge recommendations   Assessment/Plan    PT Assessment Patient needs continued PT services  PT Problem List Decreased strength;Decreased activity tolerance;Decreased balance;Decreased mobility;Decreased coordination;Pain       PT Treatment Interventions DME instruction;Gait training;Stair training;Functional mobility training;Therapeutic activities;Therapeutic exercise;Neuromuscular re-education;Balance training    PT Goals (Current goals can be found in the Care Plan section)  Acute Rehab PT Goals Patient Stated Goal: get my strrength back on my left side PT Goal Formulation: With patient Time For Goal Achievement: 12/07/22 Potential to Achieve Goals: Good    Frequency Min 1X/week     Co-evaluation               AM-PAC PT "6 Clicks" Mobility  Outcome Measure Help needed turning from your back to your side while in a flat bed without using bedrails?: None Help needed moving from lying on your back to sitting on the side of a flat bed without using bedrails?: A Little Help needed moving to and from a bed to a chair (including a wheelchair)?: A Little Help needed standing up from a chair using your arms (e.g., wheelchair  or bedside chair)?: A Little Help needed to walk in hospital room?: A Little Help needed climbing 3-5 steps with a railing? : A Lot 6 Click Score: 18    End of Session Equipment Utilized During Treatment: Gait belt Activity Tolerance: Patient limited by fatigue Patient left: in bed;with bed alarm set;with call bell/phone within reach Nurse Communication: Mobility status PT Visit Diagnosis: Unsteadiness on feet (R26.81);History of falling (Z91.81);Muscle weakness (generalized) (M62.81);Other abnormalities of gait and mobility (R26.89)    Time: 1000-1020 PT Time Calculation (min) (ACUTE ONLY): 20  min   Charges:   PT Evaluation $PT Eval Low Complexity: 1 Low   PT General Charges $$ ACUTE PT VISIT: 1 Visit        Creed Copper Fairly, PT, DPT 11/23/22 10:31 AM

## 2022-11-23 NOTE — Discharge Summary (Signed)
Physician Discharge Summary   Patient: [name] [name] MRN: [id number] DOB: [birth year]  Admit date:     11/21/2022  Discharge date: 11/23/22  Discharge Physician: Arnetha Courser   PCP: Corky Downs, MD   Recommendations at discharge:  Please obtain CBC and BMP on follow-up Follow-up with primary care provider.  Discharge Diagnoses: Principal Problem:   Stuttering Active Problems:   Left-sided weakness   Abdominal pain   Protein-calorie malnutrition, severe (HCC)   COPD (chronic obstructive pulmonary disease) (HCC)   Essential hypertension   Hypokalemia   Malignant neoplasm of lung (HCC)   Cystitis   Abnormal EKG   Electrolyte abnormality   Hospital Course: Taken from H&P.  [name] [name] is a 67 y.o. female with medical history significant for essential hypertension, hyperlipidemia, major depression, lung cancer, COPD coming for multiple complaints of and pain and falls.   Patient with complaint of abdominal pain, fall on Saturday, some intermittent left-sided weakness going on for about a month, no head trauma, no loss of consciousness.  Also having some intermittent diarrhea.  She has an history abdominal surgery for her stomach cancer for which she follows up with her doctor at [facility].  On presentation she has elevated blood pressure at 173/71, labs pertinent for hypokalemia 2.4, mild leukocytosis at 10.7, hemoglobin 11.5 MCV 100.6, initial troponin 28>>42, CT head and cervical spine was negative for any acute intracranial process or fractures.  CT abdomen and pelvis with no acute posttraumatic findings, did show dilatation of descending and horizontal duodenum with an air-fluid level, similar to the prior study done in May 2023 but slightly increased in size since July 2023.  Also shows a mildly dilated and enhancing small bowel in the lower abdomen, no transition point, can be due to mild ileitis/enteritis or partial obstruction.  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  10/29: Vital stable, mild hypophosphatemia at 1.9, hypomagnesemia, potassium 2.6, slight worsening of leukocytosis,  MRI brain was negative for any acute abnormality.  UDS positive for cocaine and UA positive for nitrites, leukocytes and bacteria, starting on ceftriaxone and urine cultures were sent. C. difficile colitis and GI pathogen panel negative.  Continue to have some diarrhea. PT evaluation ordered for generalized weakness.  10/30: Remained hemodynamically stable.  Urine cultures are still pending but significant improvement in dysuria.  Physical therapy is recommended home health which was ordered. Patient was counseled again for cocaine abuse as her symptoms might be related to the cerebral vessel spasms or secondary to cocaine use. Patient will need continuation of counseling against substance abuse.  She has been given 4 more days of Keflex for UTI.  Need to follow-up with primary care provider and they can follow-up her urine culture results.  She will continue on current medications and follow-up with her providers for further assistance.    Assessment and Plan: * Stuttering MRI brain was negative for any acute abnormality, UDS positive for cocaine, might be some intermittent spasms secondary to cocaine but no stroke.  Will continue patient on her atorvastatin, patient does have an allergy to aspirin.   Left-sided weakness Resolved.  Likely secondary to cocaine use Do not suspect CVA., MRI negative for any any acute abnormality We will follow with neuro checks.   Abdominal pain Continue to have diarrhea, no nausea or vomiting now. C. difficile and GI pathogen panel negative. UDS positive for cocaine.  CT abdomen with concern  of partial SBO or mild ileitis. -Supportive care   Protein-calorie malnutrition, severe [facility]) Dietitian consult once patient is  able to take p.o and Protein supplementation with meals.   COPD (chronic obstructive pulmonary disease) (HCC) Stable no wheezing on exam patient does have a history of lung cancer and is followed by [facility] .  As needed albuterol continue Symbicort.  Essential hypertension Blood pressure mildly elevated. On amlodipine, HCTZ and lisinopril at home. -Restarting home lisinopril-will will add later agents as needed  Electrolyte abnormality Patient was found to have significant hypokalemia, mild hypomagnesemia and hypophosphatemia.  Electrolytes were repleted before discharge.  Abnormal EKG Patient has some lateral T wave inversion but that seems chronic.  History of cocaine use.  Barely positive troponin with a flat curve. Pt has allergies to aspirin.  Cont statin.  No anginal or anginal equivalent symptoms .   Cystitis Patient has some dysuria.  UA concerning for UTI with positive nitrites, leukocytes and bacteria. -Urine cultures ordered-still pending but significant improvement in her symptoms Received ceftriaxone while in the hospital and is being discharged on Keflex  Consultants: None Procedures performed: None Disposition: Home health Diet recommendation:  Discharge Diet Orders (From admission, onward)     Start     Ordered   11/23/22 0000  Diet - low sodium heart healthy        11/23/22 1050           Cardiac diet DISCHARGE MEDICATION: Allergies as of 11/23/2022       Reactions   Aspirin Hives, Itching        Medication List     STOP taking these medications    dicyclomine 10 MG capsule Commonly known as: BENTYL   hydrochlorothiazide 12.5 MG capsule Commonly known as: MICROZIDE   omeprazole 20 MG capsule Commonly known as: PRILOSEC   Symbicort 160-4.5 MCG/ACT inhaler Generic drug: budesonide-formoterol       TAKE these medications    acetaminophen 160 MG/5ML solution Commonly known as: TYLENOL Take 5 mLs (160 mg total) by mouth every 6 (six)  hours as needed for mild pain.   albuterol (2.5 MG/3ML) 0.083% nebulizer solution Commonly known as: PROVENTIL Take 3 mLs (2.5 mg total) by nebulization 3 (three) times daily as needed for wheezing.   albuterol 108 (90 Base) MCG/ACT inhaler Commonly known as: VENTOLIN HFA Inhale 1 puff into the lungs 2 (two) times daily as needed.   amLODipine 10 MG tablet Commonly known as: NORVASC Take 1 tablet by mouth daily.   atorvastatin 40 MG tablet Commonly known as: LIPITOR Take 40 mg by mouth daily.   benzonatate 100 MG capsule Commonly known as: TESSALON TAKE 2 CAPSULES BY MOUTH 3 TIMES DAILY AS NEEDED FOR COUGH   bisacodyl 5 MG EC tablet Commonly known as: DULCOLAX Take 1 tablet (5 mg total) by mouth daily as needed for moderate constipation.   buPROPion ER 100 MG 12 hr tablet Commonly known as: Wellbutrin SR Take 1 tablet (100 mg total) by mouth 2 (two) times daily.   cephALEXin 500 MG capsule Commonly known as: KEFLEX Take 1 capsule (500 mg total) by mouth 3 (three) times daily for 4 days.   cholestyramine 4 GM/DOSE powder Commonly known as: QUESTRAN TAKE 1 SCOOPFUL(4 GRAMS) MIXED IN 4-8 OUNCES OF LIQUID AND DRINK, DAILY   cyanocobalamin 250 MCG tablet Commonly known as: VITAMIN B12 Take 1 tablet (250 mcg total) by mouth daily.   cycloSPORINE 0.05 % ophthalmic emulsion Commonly known as: RESTASIS Place 1  drop into both eyes every 12 (twelve) hours.   docusate sodium 100 MG capsule Commonly known as: COLACE Take 1 capsule (100 mg total) by mouth 2 (two) times daily.   feeding supplement Liqd Take 237 mLs by mouth 2 (two) times daily between meals.   fluticasone 50 MCG/ACT nasal spray Commonly known as: FLONASE USE 1 SPRAY IN EACH NOSTRIL 3 TIMES DAILY   hydrOXYzine 25 MG tablet Commonly known as: ATARAX Take 25 mg by mouth 3 (three) times daily as needed.   lipase/protease/amylase 12000-38000 units Cpep capsule Commonly known as: CREON Take by mouth.    lisinopril 40 MG tablet Commonly known as: ZESTRIL Take 1 tablet (40 mg total) by mouth daily.   mirtazapine 15 MG tablet Commonly known as: REMERON Take 15 mg by mouth daily.   ondansetron 4 MG tablet Commonly known as: Zofran Take 1 tablet (4 mg total) by mouth every 8 (eight) hours as needed for up to 10 doses for nausea or vomiting.   oxyCODONE-acetaminophen 5-325 MG tablet Commonly known as: Percocet Take 1 tablet by mouth every 4 (four) hours as needed for severe pain.   pantoprazole 40 MG tablet Commonly known as: PROTONIX TAKE 1 TABLET BY MOUTH ONCE DAILY   polyethylene glycol 17 g packet Commonly known as: MIRALAX / GLYCOLAX Take 17 g by mouth daily as needed for mild constipation.   pregabalin 100 MG capsule Commonly known as: LYRICA Take 100 mg by mouth 3 (three) times daily.   QUEtiapine 100 MG tablet Commonly known as: SEROQUEL Take 100 mg by mouth at bedtime.   sertraline 50 MG tablet Commonly known as: ZOLOFT TAKE 1 TABLET BY MOUTH TWICE DAILY What changed: Another medication with the same name was removed. Continue taking this medication, and follow the directions you see here.   Vitamin D (Ergocalciferol) 1.25 MG (50000 UNIT) Caps capsule Commonly known as: DRISDOL TAKE 1 CAPSULE BY MOUTH WEEKLY        Follow-up Information     Corky Downs, MD. Schedule an appointment as soon as possible for a visit in 1 week(s).   Specialties: Internal Medicine, Cardiology Contact information: [address] [place] [address] [phone]                Discharge Exam: Ceasar Mons Weights   11/21/22 1628 11/23/22 0500  Weight: 44 kg 43.6 kg   General.  Malnourished lady, in no acute distress. Pulmonary.  Lungs clear bilaterally, normal respiratory effort. CV.  Regular rate and rhythm, no JVD, rub or murmur. Abdomen.  Soft, nontender, nondistended, BS positive. CNS.  Alert and oriented .  No focal neurologic deficit. Extremities.  No edema, no  cyanosis, pulses intact and symmetrical. Psychiatry.  Judgment and insight appears normal.   Condition at discharge: stable  The results of significant diagnostics from this hospitalization (including imaging, microbiology, ancillary and laboratory) are listed below for reference.   Imaging Studies: MR BRAIN WO CONTRAST  Result Date: 11/22/2022 CLINICAL DATA:  Syncope EXAM: MRI HEAD WITHOUT CONTRAST TECHNIQUE: Multiplanar, multiecho pulse sequences of the brain and surrounding structures were obtained without intravenous contrast. COMPARISON:  None Available. FINDINGS: Brain: No acute infarct, mass effect or extra-axial collection. No acute or chronic hemorrhage. There is multifocal hyperintense T2-weighted signal within the white matter. Parenchymal volume and CSF spaces are normal. The midline structures are normal. Vascular: Suspect hypoglossal artery variant of the left ICA. Otherwise normal flow voids. Skull and upper cervical spine: Normal calvarium and skull base. Visualized upper  cervical spine and soft tissues are normal. Sinuses/Orbits:No paranasal sinus fluid levels or advanced mucosal thickening. No mastoid or middle ear effusion. Normal orbits. IMPRESSION: 1. No acute intracranial abnormality. 2. Findings of chronic small vessel ischemia. Electronically Signed   By: Deatra Robinson M.D.   On: 11/22/2022 04:02   CT ABDOMEN PELVIS W CONTRAST  Result Date: 11/21/2022 CLINICAL DATA:  Larey Seat 2 days ago, now with abdominal pain. Previous history of stomach cancer, reportedly lung cancer also. EXAM: CT ABDOMEN AND PELVIS WITH CONTRAST TECHNIQUE: Multidetector CT imaging of the abdomen and pelvis was performed using the standard protocol following bolus administration of intravenous contrast. RADIATION DOSE REDUCTION: This exam was performed according to the departmental dose-optimization program which includes automated exposure control, adjustment of the mA and/or kV according to patient size  and/or use of iterative reconstruction technique. CONTRAST:  60mL OMNIPAQUE IOHEXOL 350 MG/ML SOLN COMPARISON:  CT with IV contrast 08/03/2021, 06/11/2021. Underwent chest CTA contemporaneously today. FINDINGS: Lower chest: Lung bases are clear aside from subpleural linear atelectasis in the lower lobes. There is a subpleural bleb in the medial right base. The cardiac size is normal. Left ventricular septal wall hypertrophy are again shown. Hepatobiliary: No liver metastasis or acute abnormality are identified. Surgical absence of gallbladder again noted with mild stable intrahepatic and extrahepatic biliary prominence. Pancreas: No abnormality. Spleen: No abnormality. Adrenals/Urinary Tract: There is no adrenal mass. Small scattered right renal cysts are again noted but no mass enhancement. No follow-up imaging recommended. There is no urinary stone or obstruction. The bladder is mildly thickened but also not fully distended. Correlate with urinalysis for possible cystitis. Stomach/Bowel: The stomach is contracted, with mild chronic thickened folds. Dilatation in the descending and horizontal duodenum is noted with an air-fluid level, and is similar to 06/11/2021 but increased since 08/03/2021. Postsurgical change of the small bowel in the left mid abdomen appears similar with the small bowel mildly dilated and enhancing in lower abdomen and pelvis measuring up to 3.2 cm. A transitional segment could not be found. An appendix is not seen. There is mild to moderate fecal stasis. Vascular/Lymphatic: There is aortoiliac atherosclerosis. No aneurysm is seen. The proximal SMA and celiac artery are both occluded with reconstitution distally. This was seen previously as well as pelvic venous congestion and pelvic phleboliths. No adenopathy is seen. Reproductive: Prior supracervical hysterectomy.  No adnexal mass. Other: Chronic paucity of body fat limiting subject contrast. Multiple pelvic surgical clips. There is no  incarcerated hernia, free air, free hemorrhage or ascites. Musculoskeletal: No acute or significant osseous findings. IMPRESSION: 1. No acute posttraumatic findings are seen in the abdomen or pelvis. 2. Contracted stomach with mild chronic thickened folds. 3. Dilatation of the descending and horizontal duodenum with an air-fluid level, similar to 06/11/2021 but increased since 08/03/2021. 4. Mildly dilated and enhancing small bowel in the lower abdomen and pelvis measuring up to 3.2 cm. A transitional segment could not be found. Findings could be due to ileus/enteritis or partial obstruction. 5. Constipation. 6. Aortic and branch vessel atherosclerosis. 7. 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 8. Left ventricular and septal wall hypertrophy. 9. Chronic paucity of body fat. 10. Mild bladder thickening. Correlate with urinalysis for possible cystitis. Aortic Atherosclerosis (ICD10-I70.0). Electronically Signed   By: Almira Bar M.D.   On: 11/21/2022 23:00   CT Angio Chest PE W and/or Wo Contrast  Result Date: 11/21/2022 CLINICAL DATA:  High probability for pulmonary embolism EXAM: CT ANGIOGRAPHY  CHEST WITH CONTRAST TECHNIQUE: Multidetector CT imaging of the chest was performed using the standard protocol during bolus administration of intravenous contrast. Multiplanar CT image reconstructions and MIPs were obtained to evaluate the vascular anatomy. RADIATION DOSE REDUCTION: This exam was performed according to the departmental dose-optimization program which includes automated exposure control, adjustment of the mA and/or kV according to patient size and/or use of iterative reconstruction technique. CONTRAST:  60mL OMNIPAQUE IOHEXOL 350 MG/ML SOLN COMPARISON:  CT angiogram chest 04/09/2022 FINDINGS: Cardiovascular: Satisfactory opacification of the pulmonary arteries to the segmental level. No evidence of pulmonary embolism. Normal heart size. No pericardial  effusion. There are atherosclerotic calcifications of the aorta. Mediastinum/Nodes: No enlarged mediastinal, hilar, or axillary lymph nodes. Thyroid gland, trachea, and esophagus demonstrate no significant findings. Lungs/Pleura: Mild emphysematous changes are again seen. Ovoid spiculated nodule in the right upper lobe measures 2.7 by 2.4 cm and appears similar to the prior study. Peripheral focal density in the left upper lobe measuring 1.6 x 2.9 cm also appears unchanged. There is a stable subsolid nodule in the right upper lobe measuring 6 mm, unchanged. Fissural nodule in the right upper lobe measures 4 mm image 4/55, unchanged. There is no new focal lung infiltrate, pleural effusion or pneumothorax. There some secretions in the left mainstem bronchus. Upper Abdomen: No acute abnormality. Musculoskeletal: No chest wall abnormality. Small erosive changes in the left second rib appear stable from prior. Review of the MIP images confirms the above findings. IMPRESSION: 1. No evidence for pulmonary embolism. 2. Stable spiculated nodule in the right upper lobe. Stable focal density in the left upper lobe. Findings are concerning for malignancy. 3. Stable subsolid nodule in the right upper lobe measuring 6 mm. Aortic Atherosclerosis (ICD10-I70.0) and Emphysema (ICD10-J43.9). Electronically Signed   By: Darliss Cheney M.D.   On: 11/21/2022 21:48   CT HEAD WO CONTRAST  Result Date: 11/21/2022 CLINICAL DATA:  Fall, head and neck pain, stuttering speech and left-sided weakness EXAM: CT HEAD WITHOUT CONTRAST CT CERVICAL SPINE WITHOUT CONTRAST TECHNIQUE: Multidetector CT imaging of the head and cervical spine was performed following the standard protocol without intravenous contrast. Multiplanar CT image reconstructions of the cervical spine were also generated. RADIATION DOSE REDUCTION: This exam was performed according to the departmental dose-optimization program which includes automated exposure control, adjustment  of the mA and/or kV according to patient size and/or use of iterative reconstruction technique. COMPARISON:  10/22/2018 CT head and cervical spine FINDINGS: CT HEAD FINDINGS Brain: No evidence of acute infarct, hemorrhage, mass, mass effect, or midline shift. No hydrocephalus or extra-axial fluid collection. Periventricular white matter changes, likely the sequela of chronic small vessel ischemic disease. Vascular: No hyperdense vessel. Skull: Negative for fracture or focal lesion. Sinuses/Orbits: No acute finding. Other: The mastoid air cells are well aerated. CT CERVICAL SPINE FINDINGS Alignment: No traumatic listhesis. Straightening of the normal cervical lordosis. Skull base and vertebrae: No acute fracture or suspicious osseous lesion. Soft tissues and spinal canal: No prevertebral fluid or swelling. No visible canal hematoma. Disc levels: Degenerative changes in the cervical spine.No high-grade spinal canal stenosis. Upper chest: No focal pulmonary opacity or pleural effusion. Left-greater-than-right pleuroparenchymal scarring. IMPRESSION: 1. No acute intracranial process. 2. No acute fracture or traumatic listhesis in the cervical spine. Electronically Signed   By: Wiliam Ke M.D.   On: 11/21/2022 19:11   CT Cervical Spine Wo Contrast  Result Date: 11/21/2022 CLINICAL DATA:  Fall, head and neck pain, stuttering speech and left-sided weakness EXAM: CT HEAD  WITHOUT CONTRAST CT CERVICAL SPINE WITHOUT CONTRAST TECHNIQUE: Multidetector CT imaging of the head and cervical spine was performed following the standard protocol without intravenous contrast. Multiplanar CT image reconstructions of the cervical spine were also generated. RADIATION DOSE REDUCTION: This exam was performed according to the departmental dose-optimization program which includes automated exposure control, adjustment of the mA and/or kV according to patient size and/or use of iterative reconstruction technique. COMPARISON:  10/22/2018  CT head and cervical spine FINDINGS: CT HEAD FINDINGS Brain: No evidence of acute infarct, hemorrhage, mass, mass effect, or midline shift. No hydrocephalus or extra-axial fluid collection. Periventricular white matter changes, likely the sequela of chronic small vessel ischemic disease. Vascular: No hyperdense vessel. Skull: Negative for fracture or focal lesion. Sinuses/Orbits: No acute finding. Other: The mastoid air cells are well aerated. CT CERVICAL SPINE FINDINGS Alignment: No traumatic listhesis. Straightening of the normal cervical lordosis. Skull base and vertebrae: No acute fracture or suspicious osseous lesion. Soft tissues and spinal canal: No prevertebral fluid or swelling. No visible canal hematoma. Disc levels: Degenerative changes in the cervical spine.No high-grade spinal canal stenosis. Upper chest: No focal pulmonary opacity or pleural effusion. Left-greater-than-right pleuroparenchymal scarring. IMPRESSION: 1. No acute intracranial process. 2. No acute fracture or traumatic listhesis in the cervical spine. Electronically Signed   By: Wiliam Ke M.D.   On: 11/21/2022 19:11    Microbiology: Results for orders placed or performed during the hospital encounter of 11/21/22  Gastrointestinal Panel by PCR , Stool     Status: None   Collection Time: 11/21/22 11:21 PM   Specimen: Stool  Result Value Ref Range Status   Campylobacter species NOT DETECTED NOT DETECTED Final   Plesimonas shigelloides NOT DETECTED NOT DETECTED Final   Salmonella species NOT DETECTED NOT DETECTED Final   Yersinia enterocolitica NOT DETECTED NOT DETECTED Final   Vibrio species NOT DETECTED NOT DETECTED Final   Vibrio cholerae NOT DETECTED NOT DETECTED Final   Enteroaggregative E coli (EAEC) NOT DETECTED NOT DETECTED Final   Enteropathogenic E coli (EPEC) NOT DETECTED NOT DETECTED Final   Enterotoxigenic E coli (ETEC) NOT DETECTED NOT DETECTED Final   Shiga like toxin producing E coli (STEC) NOT DETECTED NOT  DETECTED Final   Shigella/Enteroinvasive E coli (EIEC) NOT DETECTED NOT DETECTED Final   Cryptosporidium NOT DETECTED NOT DETECTED Final   Cyclospora cayetanensis NOT DETECTED NOT DETECTED Final   Entamoeba histolytica NOT DETECTED NOT DETECTED Final   Giardia lamblia NOT DETECTED NOT DETECTED Final   Adenovirus F40/41 NOT DETECTED NOT DETECTED Final   Astrovirus NOT DETECTED NOT DETECTED Final   Norovirus GI/GII NOT DETECTED NOT DETECTED Final   Rotavirus A NOT DETECTED NOT DETECTED Final   Sapovirus (I, II, IV, and V) NOT DETECTED NOT DETECTED Final    Comment: Performed at [facility], [address] [place], [place] [address]  C Difficile Quick Screen w PCR reflex     Status: None   Collection Time: 11/21/22 11:21 PM   Specimen: Stool  Result Value Ref Range Status   C Diff antigen NEGATIVE NEGATIVE Final   C Diff toxin NEGATIVE NEGATIVE Final   C Diff interpretation No C. difficile detected.  Final    Comment: Performed at [facility], [address] [place], [place] [address]    Labs: CBC: Recent Labs  Lab 11/21/22 1631 11/22/22 0546  WBC 10.7* 11.9*  NEUTROABS 8.6*  --   HGB 11.5* 11.2*  HCT 34.0* 33.6*  MCV 100.6* 100.3*  PLT 254 234   Basic Metabolic Panel: Recent Labs  Lab 11/21/22 1631 11/22/22 0546 11/23/22 0426  NA 139 139 139  K 2.4* 2.6* 3.8  CL 109 110 111  CO2 22 24 23   GLUCOSE 112* 113* 97  BUN 14 13 12   CREATININE 0.56 0.37* 0.69  CALCIUM 7.6* 7.4* 7.7*  MG 1.8 1.7 2.3  PHOS  --  1.9* 2.0*   Liver Function Tests: Recent Labs  Lab 11/21/22 1631  AST 13*  ALT 17  ALKPHOS 96  BILITOT 0.5  PROT 6.2*  ALBUMIN 3.5   CBG: No results for input(s): "GLUCAP" in the last 168 hours.  Discharge time spent: greater than 30 minutes.  This record has been created using Conservation officer, historic buildings. Errors have been sought and corrected,but may not always be located. Such creation errors do not reflect on the standard  of care.   Signed: Arnetha Courser, MD [facility] 11/23/2022

## 2022-11-23 NOTE — Progress Notes (Signed)
Initial Nutrition Assessment  DOCUMENTATION CODES:   Underweight, Non-severe (moderate) malnutrition in context of chronic illness  INTERVENTION:   -Ensure Enlive po TID, each supplement provides 350 kcal and 20 grams of protein -MVI with minerals daily -Liberalize diet to regular for widest variety of meal selections  NUTRITION DIAGNOSIS:   Severe Malnutrition related to chronic illness (COPD, mesenteric ischemia with bowel resection) as evidenced by moderate fat depletion, mild fat depletion, mild muscle depletion, moderate muscle depletion.  GOAL:   Patient will meet greater than or equal to 90% of their needs  MONITOR:   PO intake, Supplement acceptance  REASON FOR ASSESSMENT:   Consult Assessment of nutrition requirement/status  ASSESSMENT:   Pt with medical history significant for essential hypertension, hyperlipidemia, major depression, lung cancer, COPD coming for multiple complaints of and pain and falls.  Pt admitted with stuttering, lt sided weakness, and abdominal pain.   Reviewed I/O's: +3 ml since admission  Spoke with pt at bedside, who was pleasant and in good spirits today. She reports she slept well last night. Pt reports good appetite, noted she consumed 100% of her breakfast. Per pt, she eats "all the time" at home. Meals consists of chicken/ pork, green beans, and potatoes. Pt drinks mostly water and kool aid; also drinks 3 Ensure supplements daily (started this about a year ago). Pt missing 2 bottom teeth, which sometimes cause her pain while eating, and has a dentist appointment in December to address this.   Pt shares that she often has diarrhea; loose stools often occur whenever she urinates. Pt shares that this does not impact her quality of life, however, does have a potty chair in her home to reduce accidents. Pt shares that she had bowel surgery years ago where about 75% of her bowel was removed. 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 Suspect that there may be a component of malabsorption which may be contributing to diarrhea. Pt shares that she follows with her MD and no recent concerns with her management or labs. Pt shares that she takes mirtazapine, which helps with frequent of diarrhea. Of note, pt had been on TPN in the past, but has not been since at least 2017.   Per pt, her weight "goes up and down". Her UBW is around 97#. She shares with this RD that she was weighing between 125-130# prior to her bowl surgery. No wt loss noted over the past 9 months.   Pt lives alone and is able to cook and shop for her own groceries. She denies any difficulty obtaining or preparing food other than being without hot water for the past two months because her water heater is broken.   Discussed importance of good meal and supplement intake to promote healing. Pt declined need for mechanically altered diet, but agreeable to regular diet for widest variety of meal selections. Pt also amenable to continue Ensure supplements for optimal oral intake.   Per MD, plan to d/c home today.  Medications reviewed and include colace and remeron.   Labs reviewed: CBGS: 138.  NUTRITION - FOCUSED PHYSICAL EXAM:  Flowsheet Row Most Recent Value  Orbital Region Moderate depletion  Upper Arm Region Moderate depletion  Thoracic and Lumbar Region Mild depletion  Buccal Region Moderate depletion  Temple Region Moderate depletion  Clavicle Bone Region Severe depletion  Clavicle and Acromion Bone Region Severe depletion  Scapular Bone Region Severe depletion  Dorsal Hand Moderate depletion  Patellar Region Moderate depletion  Anterior Thigh Region Moderate depletion  Posterior Calf Region Moderate depletion  Edema (RD Assessment) None  Hair Reviewed  Eyes Reviewed  Mouth Reviewed  Skin Reviewed  Nails Reviewed       Diet Order:   Diet Order              Diet regular Room service appropriate? Yes; Fluid consistency: Thin  Diet effective now           Diet - low sodium heart healthy                   EDUCATION NEEDS:   Education needs have been addressed  Skin:  Skin Assessment: Reviewed RN Assessment  Last BM:  11/22/22  Height:   Ht Readings from Last 1 Encounters:  11/21/22 5\' 5"  (1.651 m)    Weight:   Wt Readings from Last 1 Encounters:  11/23/22 43.6 kg    Ideal Body Weight:  56.8 kg  BMI:  Body mass index is 16 kg/m.  Estimated Nutritional Needs:   Kcal:  1700-1900  Protein:  90-105 grams  Fluid:  > 1.7 L    Levada Schilling, RD, LDN, CDCES Registered Dietitian III Certified Diabetes Care and Education Specialist Please refer to [facility] for RD and/or RD on-call/weekend/after hours pager

## 2022-11-23 NOTE — TOC Transition Note (Signed)
Transition of Care [facility]) - CM/SW Discharge Note   Patient Details  Name: [name] [name] MRN: [id number] Date of Birth: June 26, 1955  [facility] of [facility]) CM/SW Contact:  Garret Reddish, RN Phone Number: 11/23/2022, 12:48 PM   Clinical Narrative:    Chart reviewed.  Noted that patient is a discharge for today.   I have spoken with patient about home care services.  Patient did not have a home health preference.  I have asked [place] with [facility] to accept home health referral. [facility] will provide RN, PT, and OT at home.    I have informed staff nurse of the above information.     Final next level of care: Home w Home Health Services Barriers to Discharge: No Barriers Identified   Patient Goals and CMS Choice CMS Medicare.gov Compare Post Acute Care list provided to:: Patient Choice offered to / list presented to : Patient  Discharge Placement                      Patient and family notified of of transfer: 11/23/22  Discharge Plan and Services Additional resources added to the After Visit Summary for                  DME Arranged:  (Patient has all needed DME)         HH Arranged: RN, PT, OT [facility] Agency: [facility] Date [facility] Agency Contacted: 11/23/22   Representative spoke with at [facility] Agency: [place]  Social Determinants of Health (SDOH) Interventions SDOH Screenings   Food Insecurity: No Food Insecurity (11/22/2022)  Housing: Low Risk  (11/22/2022)  Transportation Needs: No Transportation Needs (11/22/2022)  Utilities: Not At Risk (11/22/2022)  Alcohol Screen: Low Risk  (01/11/2021)  Depression (PHQ2-9): Medium Risk (01/11/2021)  Financial Resource Strain: High Risk (01/11/2021)  Physical Activity: Insufficiently Active (01/11/2021)  Social Connections: Socially Isolated (01/11/2021)  Stress: Stress Concern Present (01/11/2021)  Tobacco Use: High Risk (11/22/2022)  Health Literacy: Medium Risk (12/09/2020)   Received from [facility]     Readmission Risk Interventions     No data to display

## 2022-11-23 NOTE — Consult Note (Signed)
PHARMACY CONSULT NOTE - ELECTROLYTES  Pharmacy Consult for Electrolyte Monitoring and Replacement   Recent Labs: Height: 5\' 5"  (165.1 cm) Weight: 43.6 kg (96 lb 1.9 oz) IBW/kg (Calculated) : 57 Estimated Creatinine Clearance: 48.3 mL/min (by C-G formula based on SCr of 0.69 mg/dL). Potassium (mmol/L)  Date Value  11/23/2022 3.8  11/04/2013 3.1 (L)   Magnesium (mg/dL)  Date Value  16/10/9602 2.3   Calcium (mg/dL)  Date Value  54/09/8117 7.7 (L)   Calcium, Total (mg/dL)  Date Value  14/78/2956 8.5   Albumin (g/dL)  Date Value  21/30/8657 3.5  11/04/2013 3.3 (L)   Phosphorus (mg/dL)  Date Value  84/69/6295 2.0 (L)   Sodium (mmol/L)  Date Value  11/23/2022 139  11/04/2013 143    Assessment  [name] [name] is a 67 y.o. female presenting with multiple falls. PMH significant for HTN, HLD, lung cancer. Pharmacy has been consulted to monitor and replace electrolytes.  Diet: PO, Ensure MIVF: LR @ 50 mL/hr Pertinent medications: N/A  Goal of Therapy: Electrolytes WNL  Plan:  Updated 10/29 @ 1145: Phos 1.9 > 2.0 K-Phos 500 mg PO x 1 dose BMP, Mg, Phos again with AM labs  Thank you for allowing pharmacy to be a part of this patient's care.  Elliot Gurney, PharmD, BCPS Clinical Pharmacist  11/23/2022 7:25 AM

## 2022-11-23 NOTE — Evaluation (Signed)
Occupational Therapy Evaluation Patient Details Name: [name] [name] MRN: [id number] DOB: 07-19-1955 Today's Date: 11/23/2022   History of Present Illness [name] [name] is a 67 y.o. female with medical history significant for essential hypertension, hyperlipidemia, major depression, lung cancer, COPD coming for multiple complaints of and abdominal pain, falls, and with intermittent left sided weakness. MRI brain was negative for acute abnormality.   Clinical Impression    Chart reviewed prior to session. Pt was seen for OT evaluation this date. Pt was alert and oriented x4. Prior to hospital admission, pt was MOD I for mobility, using a rollator for community distances and [facility] in home. Pt reports she receives PRN assistance 7 days a week for 2-3 hours, for cleaning and often bathing her. Pt lives alone in an apartment with 2 steps to enter. Pt presents to acute OT demonstrating impaired ADL performance and functional mobility (See OT problem list for additional functional deficits). Pt generally weak and reports quick fatigue after movement. Pt completed bed mobility with supervision, using bed rails/elevated HOB to assist her. Pt currently requires CGA + RW for all OOB mobility. Pt completed standing grooming tasks with CGA + RW. UB and LB dressing completed to prepare for d/c, completed in sitting on EOB with supervision + CGA when standing. Pt would benefit from skilled OT services to address noted impairments and functional limitations (see below for any additional details) in order to maximize safety and independence while minimizing falls risk and caregiver burden. OT will follow acutely.     If plan is discharge home, recommend the following: A little help with walking and/or transfers;A little help with bathing/dressing/bathroom;Assistance with cooking/housework;Assist for transportation    Functional Status Assessment  Patient has had a recent decline in their functional status and  demonstrates the ability to make significant improvements in function in a reasonable and predictable amount of time.  Equipment Recommendations  None recommended by OT    Recommendations for Other Services       Precautions / Restrictions Precautions Precautions: Fall Restrictions Weight Bearing Restrictions: No      Mobility Bed Mobility Overal bed mobility: Needs Assistance Bed Mobility: Sit to Supine       Sit to supine: Contact guard assist        Transfers Overall transfer level: Needs assistance Equipment used: Rolling walker (2 wheels) Transfers: Sit to/from Stand Sit to Stand: Contact guard assist           General transfer comment: Pt amb within room ~65ft, no LOB noted      Balance Overall balance assessment: Needs assistance Sitting-balance support: Feet supported, No upper extremity supported Sitting balance-Leahy Scale: Good Sitting balance - Comments: Dynamic sitting on EOB during dressing   Standing balance support: Bilateral upper extremity supported, During functional activity Standing balance-Leahy Scale: Fair Standing balance comment: Pt was reliant on AD for support during all OOB mobility. No overt LOB noted                           ADL either performed or assessed with clinical judgement   ADL Overall ADL's : Needs assistance/impaired     Grooming: Wash/dry hands;Wash/dry face;Standing;Contact guard assist Grooming Details (indicate cue type and reason): sink level         Upper Body Dressing : Supervision/safety;Sitting   Lower Body Dressing: Supervision/safety;Sit to/from stand;Contact guard assist Lower Body Dressing Details (indicate cue type and reason): CGA in standing Toilet Transfer:  Ambulation;Rolling walker (2 wheels);Contact guard assist Toilet Transfer Details (indicate cue type and reason): Simulated toilet t/f         Functional mobility during ADLs: Contact guard assist;Rolling walker (2 wheels)        Vision Baseline Vision/History: 1 Wears glasses       Perception Perception: Within Functional Limits       Praxis Praxis: WFL       Pertinent Vitals/Pain Pain Assessment Pain Assessment: Faces Faces Pain Scale: Hurts a little bit Pain Location: L shoulder/proximal arm Pain Descriptors / Indicators: Tingling Pain Intervention(s): Limited activity within patient's tolerance, Monitored during session     Extremity/Trunk Assessment Upper Extremity Assessment Upper Extremity Assessment: Generalized weakness;Left hand dominant LUE Deficits / Details: AROM shoulder flexion through aprpox 3/4 full AROM, elbow/wrist appear WFL; Mild weakness noted throughout including FMC/dexterity; Pt reports tingling throughout LUE   Lower Extremity Assessment Lower Extremity Assessment: Defer to PT evaluation    Cervical / Trunk Assessment Cervical / Trunk Assessment: Normal   Communication Communication Communication: No apparent difficulties Cueing Techniques: Verbal cues   Cognition Arousal: Alert Behavior During Therapy: WFL for tasks assessed/performed Overall Cognitive Status: No family/caregiver present to determine baseline cognitive functioning Area of Impairment: Awareness, Safety/judgement, Following commands                       Following Commands: Follows one step commands consistently Safety/Judgement: Decreased awareness of deficits   Problem Solving: Slow processing General Comments: A&O during session     General Comments       Exercises Other Exercises Other Exercises: edu: role of OT, safe ADL completion, d/c recs if UE tinglng symptoms persist   Shoulder Instructions      Home Living Family/patient expects to be discharged to:: Private residence Living Arrangements: Alone Available Help at Discharge: Other (Comment);Available PRN/intermittently (pt reports she has a HHA 7 days a week 3-4 hrs per day) Type of Home: Apartment Home Access:  Stairs to enter [employer] of Steps: 2 Entrance Stairs-Rails: Right Home Layout: One level     Bathroom Shower/Tub: Chief Strategy Officer: Standard Bathroom Accessibility: Yes   Home Equipment: Rollator (4 wheels);Cane - single point;Shower seat          Prior Functioning/Environment Prior Level of Function : Independent/Modified Independent             Mobility Comments: Pt reports she is Mod I with mobility. Use of SPC for indoor ambulation, community ambulation use of Rollator. ADLs Comments: Reports Mod I with ADLs; typically showers but recently sponge baths due to no hot water. Pt reports PRN in home 7 days a week for 2-3 hours, assisting with home cleaning and often bathing. Pt uses delivery service for grocery delivery.           OT Problem List: Decreased strength;Decreased knowledge of use of DME or AE;Decreased activity tolerance;Impaired balance (sitting and/or standing);Decreased safety awareness      OT Treatment/Interventions: Self-care/ADL training;Therapeutic exercise;Balance training;Energy conservation;Therapeutic activities;DME and/or AE instruction    OT Goals(Current goals can be found in the care plan section) Acute Rehab OT Goals Patient Stated Goal: to return home OT Goal Formulation: With patient Time For Goal Achievement: 12/07/22 Potential to Achieve Goals: Good ADL Goals Pt Will Perform Grooming: with modified independence Pt Will Perform Lower Body Dressing: with modified independence;sit to/from stand Pt Will Transfer to Toilet: with modified independence;ambulating Pt Will Perform Toileting - Clothing Manipulation and  hygiene: with modified independence;sit to/from stand Pt/caregiver will Perform Home Exercise Program: Increased ROM;Increased strength;Left upper extremity  OT Frequency: Min 1X/week    Co-evaluation              AM-PAC OT "6 Clicks" Daily Activity     Outcome Measure Help from another  person eating meals?: None Help from another person taking care of personal grooming?: A Little Help from another person toileting, which includes using toliet, bedpan, or urinal?: A Little Help from another person bathing (including washing, rinsing, drying)?: A Little Help from another person to put on and taking off regular upper body clothing?: None Help from another person to put on and taking off regular lower body clothing?: None 6 Click Score: 21   End of Session Equipment Utilized During Treatment: Gait belt;Rolling walker (2 wheels)  Activity Tolerance: Patient tolerated treatment well Patient left: in bed;with call bell/phone within reach;with bed alarm set  OT Visit Diagnosis: Other abnormalities of gait and mobility (R26.89);Muscle weakness (generalized) (M62.81)                Time: 1610-9604 OT Time Calculation (min): 18 min Charges:  OT General Charges $OT Visit: 1 Visit OT Evaluation $OT Eval Moderate Complexity: 1 Mod  [employer], OTS

## 2022-11-24 LAB — URINE CULTURE: Culture: >=100000 — AB

## 2022-11-29 DIAGNOSIS — Z85028 Personal history of other malignant neoplasm of stomach: Secondary | ICD-10-CM | POA: Diagnosis not present

## 2022-11-29 DIAGNOSIS — E43 Unspecified severe protein-calorie malnutrition: Secondary | ICD-10-CM | POA: Diagnosis not present

## 2022-11-29 DIAGNOSIS — I10 Essential (primary) hypertension: Secondary | ICD-10-CM | POA: Diagnosis not present

## 2022-11-29 DIAGNOSIS — Z79899 Other long term (current) drug therapy: Secondary | ICD-10-CM | POA: Diagnosis not present

## 2022-11-29 DIAGNOSIS — I429 Cardiomyopathy, unspecified: Secondary | ICD-10-CM | POA: Diagnosis not present

## 2022-11-29 DIAGNOSIS — E785 Hyperlipidemia, unspecified: Secondary | ICD-10-CM | POA: Diagnosis not present

## 2022-11-29 DIAGNOSIS — M12812 Other specific arthropathies, not elsewhere classified, left shoulder: Secondary | ICD-10-CM | POA: Diagnosis not present

## 2022-11-29 DIAGNOSIS — E876 Hypokalemia: Secondary | ICD-10-CM | POA: Diagnosis not present

## 2022-11-29 DIAGNOSIS — N3090 Cystitis, unspecified without hematuria: Secondary | ICD-10-CM | POA: Diagnosis not present

## 2022-11-29 DIAGNOSIS — R109 Unspecified abdominal pain: Secondary | ICD-10-CM | POA: Diagnosis not present

## 2022-11-29 DIAGNOSIS — R197 Diarrhea, unspecified: Secondary | ICD-10-CM | POA: Diagnosis not present

## 2022-11-29 DIAGNOSIS — K551 Chronic vascular disorders of intestine: Secondary | ICD-10-CM | POA: Diagnosis not present

## 2022-11-29 DIAGNOSIS — J449 Chronic obstructive pulmonary disease, unspecified: Secondary | ICD-10-CM | POA: Diagnosis not present

## 2022-11-29 DIAGNOSIS — F8081 Childhood onset fluency disorder: Secondary | ICD-10-CM | POA: Diagnosis not present

## 2022-11-29 DIAGNOSIS — E878 Other disorders of electrolyte and fluid balance, not elsewhere classified: Secondary | ICD-10-CM | POA: Diagnosis not present

## 2022-11-29 DIAGNOSIS — Z9181 History of falling: Secondary | ICD-10-CM | POA: Diagnosis not present

## 2022-11-29 DIAGNOSIS — Z85118 Personal history of other malignant neoplasm of bronchus and lung: Secondary | ICD-10-CM | POA: Diagnosis not present

## 2022-12-01 DIAGNOSIS — I10 Essential (primary) hypertension: Secondary | ICD-10-CM | POA: Diagnosis not present

## 2022-12-01 DIAGNOSIS — Z85028 Personal history of other malignant neoplasm of stomach: Secondary | ICD-10-CM | POA: Diagnosis not present

## 2022-12-01 DIAGNOSIS — E878 Other disorders of electrolyte and fluid balance, not elsewhere classified: Secondary | ICD-10-CM | POA: Diagnosis not present

## 2022-12-01 DIAGNOSIS — N3090 Cystitis, unspecified without hematuria: Secondary | ICD-10-CM | POA: Diagnosis not present

## 2022-12-01 DIAGNOSIS — Z79899 Other long term (current) drug therapy: Secondary | ICD-10-CM | POA: Diagnosis not present

## 2022-12-01 DIAGNOSIS — M12812 Other specific arthropathies, not elsewhere classified, left shoulder: Secondary | ICD-10-CM | POA: Diagnosis not present

## 2022-12-01 DIAGNOSIS — R109 Unspecified abdominal pain: Secondary | ICD-10-CM | POA: Diagnosis not present

## 2022-12-01 DIAGNOSIS — K551 Chronic vascular disorders of intestine: Secondary | ICD-10-CM | POA: Diagnosis not present

## 2022-12-01 DIAGNOSIS — I429 Cardiomyopathy, unspecified: Secondary | ICD-10-CM | POA: Diagnosis not present

## 2022-12-01 DIAGNOSIS — Z85118 Personal history of other malignant neoplasm of bronchus and lung: Secondary | ICD-10-CM | POA: Diagnosis not present

## 2022-12-01 DIAGNOSIS — E43 Unspecified severe protein-calorie malnutrition: Secondary | ICD-10-CM | POA: Diagnosis not present

## 2022-12-01 DIAGNOSIS — J449 Chronic obstructive pulmonary disease, unspecified: Secondary | ICD-10-CM | POA: Diagnosis not present

## 2022-12-01 DIAGNOSIS — E785 Hyperlipidemia, unspecified: Secondary | ICD-10-CM | POA: Diagnosis not present

## 2022-12-01 DIAGNOSIS — Z9181 History of falling: Secondary | ICD-10-CM | POA: Diagnosis not present

## 2022-12-01 DIAGNOSIS — R197 Diarrhea, unspecified: Secondary | ICD-10-CM | POA: Diagnosis not present

## 2022-12-01 DIAGNOSIS — F8081 Childhood onset fluency disorder: Secondary | ICD-10-CM | POA: Diagnosis not present

## 2022-12-01 DIAGNOSIS — E876 Hypokalemia: Secondary | ICD-10-CM | POA: Diagnosis not present

## 2022-12-05 DIAGNOSIS — Z79899 Other long term (current) drug therapy: Secondary | ICD-10-CM | POA: Diagnosis not present

## 2022-12-05 DIAGNOSIS — K551 Chronic vascular disorders of intestine: Secondary | ICD-10-CM | POA: Diagnosis not present

## 2022-12-05 DIAGNOSIS — J449 Chronic obstructive pulmonary disease, unspecified: Secondary | ICD-10-CM | POA: Diagnosis not present

## 2022-12-05 DIAGNOSIS — Z85118 Personal history of other malignant neoplasm of bronchus and lung: Secondary | ICD-10-CM | POA: Diagnosis not present

## 2022-12-05 DIAGNOSIS — R197 Diarrhea, unspecified: Secondary | ICD-10-CM | POA: Diagnosis not present

## 2022-12-05 DIAGNOSIS — N3090 Cystitis, unspecified without hematuria: Secondary | ICD-10-CM | POA: Diagnosis not present

## 2022-12-05 DIAGNOSIS — Z9181 History of falling: Secondary | ICD-10-CM | POA: Diagnosis not present

## 2022-12-05 DIAGNOSIS — R109 Unspecified abdominal pain: Secondary | ICD-10-CM | POA: Diagnosis not present

## 2022-12-05 DIAGNOSIS — E878 Other disorders of electrolyte and fluid balance, not elsewhere classified: Secondary | ICD-10-CM | POA: Diagnosis not present

## 2022-12-05 DIAGNOSIS — E785 Hyperlipidemia, unspecified: Secondary | ICD-10-CM | POA: Diagnosis not present

## 2022-12-05 DIAGNOSIS — I429 Cardiomyopathy, unspecified: Secondary | ICD-10-CM | POA: Diagnosis not present

## 2022-12-05 DIAGNOSIS — M12812 Other specific arthropathies, not elsewhere classified, left shoulder: Secondary | ICD-10-CM | POA: Diagnosis not present

## 2022-12-05 DIAGNOSIS — F8081 Childhood onset fluency disorder: Secondary | ICD-10-CM | POA: Diagnosis not present

## 2022-12-05 DIAGNOSIS — I10 Essential (primary) hypertension: Secondary | ICD-10-CM | POA: Diagnosis not present

## 2022-12-05 DIAGNOSIS — E43 Unspecified severe protein-calorie malnutrition: Secondary | ICD-10-CM | POA: Diagnosis not present

## 2022-12-05 DIAGNOSIS — Z85028 Personal history of other malignant neoplasm of stomach: Secondary | ICD-10-CM | POA: Diagnosis not present

## 2022-12-05 DIAGNOSIS — E876 Hypokalemia: Secondary | ICD-10-CM | POA: Diagnosis not present

## 2023-01-05 DIAGNOSIS — C3411 Malignant neoplasm of upper lobe, right bronchus or lung: Secondary | ICD-10-CM | POA: Diagnosis not present

## 2023-01-05 DIAGNOSIS — Z923 Personal history of irradiation: Secondary | ICD-10-CM | POA: Diagnosis not present

## 2023-04-04 DIAGNOSIS — H52223 Regular astigmatism, bilateral: Secondary | ICD-10-CM | POA: Diagnosis not present

## 2023-04-04 DIAGNOSIS — H2512 Age-related nuclear cataract, left eye: Secondary | ICD-10-CM | POA: Diagnosis not present

## 2023-04-04 DIAGNOSIS — H2511 Age-related nuclear cataract, right eye: Secondary | ICD-10-CM | POA: Diagnosis not present

## 2023-04-04 DIAGNOSIS — H5203 Hypermetropia, bilateral: Secondary | ICD-10-CM | POA: Diagnosis not present

## 2023-04-05 DIAGNOSIS — H5213 Myopia, bilateral: Secondary | ICD-10-CM | POA: Diagnosis not present

## 2023-04-06 NOTE — Telephone Encounter (Signed)
 [facility] Other Call ONC Phone Room Smart Lists: Cancellation/Reschedule -   Hi ,  Patient [name] [name] contacted the Communication Center to reschedule their appointment for today.  The original appointment has not been cancelled.  Cancellation Reason: Transportation   Patient's appointment requires reschedule.  Thank you, Jackson DELENA Aid [facility]  [phone]  [facility]

## 2023-05-19 DIAGNOSIS — J449 Chronic obstructive pulmonary disease, unspecified: Secondary | ICD-10-CM | POA: Diagnosis not present

## 2023-05-19 DIAGNOSIS — C3411 Malignant neoplasm of upper lobe, right bronchus or lung: Secondary | ICD-10-CM | POA: Diagnosis not present

## 2023-05-19 DIAGNOSIS — J432 Centrilobular emphysema: Secondary | ICD-10-CM | POA: Diagnosis not present

## 2023-05-19 DIAGNOSIS — Z923 Personal history of irradiation: Secondary | ICD-10-CM | POA: Diagnosis not present

## 2023-08-23 ENCOUNTER — Ambulatory Visit: Admitting: Cardiology

## 2023-09-04 ENCOUNTER — Ambulatory Visit: Admitting: Cardiology

## 2023-09-04 ENCOUNTER — Encounter: Payer: Self-pay | Admitting: Cardiology

## 2023-09-04 ENCOUNTER — Ambulatory Visit (INDEPENDENT_AMBULATORY_CARE_PROVIDER_SITE_OTHER): Admitting: Cardiology

## 2023-09-04 VITALS — BP 142/76 | HR 74 | Ht 65.0 in | Wt 90.8 lb

## 2023-09-04 DIAGNOSIS — I10 Essential (primary) hypertension: Secondary | ICD-10-CM

## 2023-09-04 DIAGNOSIS — Z716 Tobacco abuse counseling: Secondary | ICD-10-CM | POA: Diagnosis not present

## 2023-09-04 DIAGNOSIS — K551 Chronic vascular disorders of intestine: Secondary | ICD-10-CM | POA: Diagnosis not present

## 2023-09-04 DIAGNOSIS — K559 Vascular disorder of intestine, unspecified: Secondary | ICD-10-CM | POA: Diagnosis not present

## 2023-09-04 DIAGNOSIS — Z7689 Persons encountering health services in other specified circumstances: Secondary | ICD-10-CM | POA: Insufficient documentation

## 2023-09-04 DIAGNOSIS — J301 Allergic rhinitis due to pollen: Secondary | ICD-10-CM | POA: Diagnosis not present

## 2023-09-04 MED ORDER — QUETIAPINE FUMARATE 100 MG PO TABS: 100.0000 mg | ORAL_TABLET | Freq: Every day | ORAL | 1 refills | Status: AC

## 2023-09-04 MED ORDER — CHOLESTYRAMINE 4 GM/DOSE PO POWD: 4.0000 g | Freq: Every day | ORAL | 1 refills | Status: DC

## 2023-09-04 MED ORDER — ATORVASTATIN CALCIUM 40 MG PO TABS: 40.0000 mg | ORAL_TABLET | Freq: Every day | ORAL | 1 refills | Status: DC

## 2023-09-04 MED ORDER — PANTOPRAZOLE SODIUM 40 MG PO TBEC: 40.0000 mg | DELAYED_RELEASE_TABLET | Freq: Every day | ORAL | 1 refills | Status: DC

## 2023-09-04 MED ORDER — PANCRELIPASE (LIP-PROT-AMYL) 12000-38000 UNITS PO CPEP: 12000.0000 [IU] | ORAL_CAPSULE | Freq: Three times a day (TID) | ORAL | 0 refills | Status: DC

## 2023-09-04 MED ORDER — MIRTAZAPINE 15 MG PO TABS
15.0000 mg | ORAL_TABLET | Freq: Every day | ORAL | 1 refills | Status: AC
Start: 2023-09-04 — End: ?

## 2023-09-04 MED ORDER — LISINOPRIL 40 MG PO TABS: 40.0000 mg | ORAL_TABLET | Freq: Every day | ORAL | 3 refills | Status: DC

## 2023-09-04 MED ORDER — BUPROPION HCL ER (SR) 100 MG PO TB12
100.0000 mg | ORAL_TABLET | Freq: Two times a day (BID) | ORAL | 2 refills | Status: AC
Start: 2023-09-04 — End: ?

## 2023-09-04 MED ORDER — ALBUTEROL SULFATE HFA 108 (90 BASE) MCG/ACT IN AERS: 1.0000 | INHALATION_SPRAY | Freq: Two times a day (BID) | RESPIRATORY_TRACT | 6 refills | Status: AC | PRN

## 2023-09-04 MED ORDER — AMLODIPINE BESYLATE 10 MG PO TABS: 10.0000 mg | ORAL_TABLET | Freq: Every day | ORAL | 1 refills | Status: DC

## 2023-09-04 MED ORDER — SERTRALINE HCL 50 MG PO TABS: 50.0000 mg | ORAL_TABLET | Freq: Two times a day (BID) | ORAL | 1 refills | Status: DC

## 2023-09-04 MED ORDER — FLUTICASONE PROPIONATE 50 MCG/ACT NA SUSP: NASAL | 6 refills | Status: DC

## 2023-09-04 NOTE — Progress Notes (Signed)
 New Patient Office Visit  Subjective   Patient ID: [name] [name], female    DOB: 01-Sep-1955  Age: 68 y.o. MRN: [id number]  CC:  Chief Complaint  Patient presents with   Establish Care    New Patient. Having trouble with back and legs getting up and down, constantly in pain. Experiencing diarrhea due to allergic reaction from mosquitos. Has recently lost 8 pounds feeling weak. Hasn't seen a provider in 3 months currently has no medication.     HPI [name] presents to establish care Previous Primary Care provider/office:   she does have additional concerns to discuss today.   Patient in office to establish care. Patient has no complaints today. Previous provider retired.  No recent blood work done. Will check prior to next visit.  Patient has a history of intestinal angina, ischemic bowel, not currently seeing GI. Will send a referral.  Medications refilled, continue same medications.     Outpatient Encounter Medications as of 09/04/2023  Medication Sig   acetaminophen  (TYLENOL ) 160 MG/5ML solution Take 5 mLs (160 mg total) by mouth every 6 (six) hours as needed for mild pain.   albuterol  (PROVENTIL ) (2.5 MG/3ML) 0.083% nebulizer solution Take 3 mLs (2.5 mg total) by nebulization 3 (three) times daily as needed for wheezing.   benzonatate  (TESSALON ) 100 MG capsule TAKE 2 CAPSULES BY MOUTH 3 TIMES DAILY AS NEEDED FOR COUGH   cyanocobalamin  (VITAMIN B12) 250 MCG tablet Take 1 tablet (250 mcg total) by mouth daily.   cycloSPORINE  (RESTASIS ) 0.05 % ophthalmic emulsion Place 1 drop into both eyes every 12 (twelve) hours.   feeding supplement, ENSURE ENLIVE, (ENSURE ENLIVE) LIQD Take 237 mLs by mouth 2 (two) times daily between meals.   hydrOXYzine (ATARAX/VISTARIL) 25 MG tablet Take 25 mg by mouth 3 (three) times daily as needed.   ondansetron  (ZOFRAN ) 4 MG tablet Take 1 tablet (4 mg total) by mouth every 8 (eight) hours as needed for up to 10 doses for nausea or vomiting.    polyethylene glycol (MIRALAX  / GLYCOLAX ) 17 g packet Take 17 g by mouth daily as needed for mild constipation.   pregabalin  (LYRICA ) 100 MG capsule Take 100 mg by mouth 3 (three) times daily.    Vitamin D, Ergocalciferol, (DRISDOL) 1.25 MG (50000 UNIT) CAPS capsule TAKE 1 CAPSULE BY MOUTH WEEKLY   [DISCONTINUED] albuterol  (VENTOLIN  HFA) 108 (90 Base) MCG/ACT inhaler Inhale 1 puff into the lungs 2 (two) times daily as needed.   [DISCONTINUED] amLODipine  (NORVASC ) 10 MG tablet Take 1 tablet by mouth daily.   [DISCONTINUED] atorvastatin  (LIPITOR) 40 MG tablet Take 40 mg by mouth daily.   [DISCONTINUED] buPROPion  (WELLBUTRIN  SR) 100 MG 12 hr tablet Take 1 tablet (100 mg total) by mouth 2 (two) times daily.   [DISCONTINUED] cholestyramine  (QUESTRAN ) 4 GM/DOSE powder TAKE 1 SCOOPFUL(4 GRAMS) MIXED IN 4-8 OUNCES OF LIQUID AND DRINK, DAILY   [DISCONTINUED] fluticasone  (FLONASE ) 50 MCG/ACT nasal spray USE 1 SPRAY IN EACH NOSTRIL 3 TIMES DAILY   [DISCONTINUED] lipase/protease/amylase (CREON ) 12000-38000 units CPEP capsule Take by mouth.   [DISCONTINUED] lisinopril  (ZESTRIL ) 40 MG tablet Take 1 tablet (40 mg total) by mouth daily.   [DISCONTINUED] mirtazapine  (REMERON ) 15 MG tablet Take 15 mg by mouth daily.   [DISCONTINUED] pantoprazole  (PROTONIX ) 40 MG tablet TAKE 1 TABLET BY MOUTH ONCE DAILY   [DISCONTINUED] QUEtiapine  (SEROQUEL ) 100 MG tablet Take 100 mg by mouth at bedtime.   albuterol  (VENTOLIN  HFA) 108 (90 Base) MCG/ACT inhaler Inhale 1  puff into the lungs 2 (two) times daily as needed.   amLODipine  (NORVASC ) 10 MG tablet Take 1 tablet (10 mg total) by mouth daily.   atorvastatin  (LIPITOR) 40 MG tablet Take 1 tablet (40 mg total) by mouth daily.   bisacodyl  (DULCOLAX) 5 MG EC tablet Take 1 tablet (5 mg total) by mouth daily as needed for moderate constipation. (Patient not taking: Reported on 09/04/2023)   buPROPion  ER (WELLBUTRIN  SR) 100 MG 12 hr tablet Take 1 tablet (100 mg total) by mouth 2 (two)  times daily.   cholestyramine  (QUESTRAN ) 4 GM/DOSE powder Take 1 packet (4 g total) by mouth daily.   docusate sodium  (COLACE) 100 MG capsule Take 1 capsule (100 mg total) by mouth 2 (two) times daily. (Patient not taking: Reported on 09/04/2023)   fluticasone  (FLONASE ) 50 MCG/ACT nasal spray USE 1 SPRAY IN EACH NOSTRIL 3 TIMES DAILY   lipase/protease/amylase (CREON ) 12000-38000 units CPEP capsule Take 1 capsule (12,000 Units total) by mouth 3 (three) times daily before meals.   lisinopril  (ZESTRIL ) 40 MG tablet Take 1 tablet (40 mg total) by mouth daily.   mirtazapine  (REMERON ) 15 MG tablet Take 1 tablet (15 mg total) by mouth daily.   pantoprazole  (PROTONIX ) 40 MG tablet Take 1 tablet (40 mg total) by mouth daily.   QUEtiapine  (SEROQUEL ) 100 MG tablet Take 1 tablet (100 mg total) by mouth at bedtime.   sertraline  (ZOLOFT ) 50 MG tablet Take 1 tablet (50 mg total) by mouth 2 (two) times daily.   [DISCONTINUED] sertraline  (ZOLOFT ) 50 MG tablet TAKE 1 TABLET BY MOUTH TWICE DAILY (Patient not taking: Reported on 09/04/2023)   No facility-administered encounter medications on file as of 09/04/2023.    Past Medical History:  Diagnosis Date   Bowel obstruction (HCC)    x1 month ago   Cancer [facility])    stomach cancer per pt and currently lung ca   COPD (chronic obstructive pulmonary disease) (HCC)    Essential hypertension 12/02/2019   Lung cancer (HCC)    Personal history of radiation therapy 2019-2020   lung ca    Past Surgical History:  Procedure Laterality Date   COLON SURGERY      Family History  Problem Relation Age of Onset   Lupus Sister    Heart failure Neg Hx    Diabetes Neg Hx     Social History   Socioeconomic History   Marital status: Widowed    Spouse name: Not on file   Number of children: 2   Years of education: 3   Highest education level: Not on file  Occupational History   Occupation: on disability  Tobacco Use   Smoking status: Every Day    Current  packs/day: 0.25    Types: Cigarettes   Smokeless tobacco: Never  Vaping Use   Vaping status: Never Used  Substance and Sexual Activity   Alcohol use: Yes    Comment: occas   Drug use: No   Sexual activity: Not Currently    Birth control/protection: Post-menopausal  Other Topics Concern   Not on file  Social History Narrative   Not on file   Social Drivers of Health   Financial Resource Strain: High Risk (01/11/2021)   Overall Financial Resource Strain (CARDIA)    Difficulty of Paying Living Expenses: Hard  Food Insecurity: No Food Insecurity (11/22/2022)   Hunger Vital Sign    Worried About Running Out of Food in the Last Year: Never true    Ran Out  of Food in the Last Year: Never true  Transportation Needs: No Transportation Needs (11/22/2022)   PRAPARE - Administrator, Civil Service (Medical): No    Lack of Transportation (Non-Medical): No  Physical Activity: Insufficiently Active (01/11/2021)   Exercise Vital Sign    Days of Exercise per Week: 2 days    Minutes of Exercise per Session: 30 min  Stress: Stress Concern Present (01/11/2021)   [employer] of Occupational Health - Occupational Stress Questionnaire    Feeling of Stress : To some extent  Social Connections: Socially Isolated (01/11/2021)   Social Connection and Isolation Panel    Frequency of Communication with Friends and Family: Once a week    Frequency of Social Gatherings with Friends and Family: Once a week    Attends Religious Services: Never    Database administrator or Organizations: No    Attends Banker Meetings: Never    Marital Status: Widowed  Intimate Partner Violence: Not At Risk (11/22/2022)   Humiliation, Afraid, Rape, and Kick questionnaire    Fear of Current or Ex-Partner: No    Emotionally Abused: No    Physically Abused: No    Sexually Abused: No    Review of Systems  Constitutional: Negative.   HENT: Negative.    Eyes: Negative.   Respiratory:  Negative.  Negative for shortness of breath.   Cardiovascular: Negative.  Negative for chest pain.  Gastrointestinal: Negative.  Negative for abdominal pain, constipation and diarrhea.  Genitourinary: Negative.   Musculoskeletal:  Negative for joint pain and myalgias.  Skin: Negative.   Neurological: Negative.  Negative for dizziness and headaches.  Endo/Heme/Allergies: Negative.   All other systems reviewed and are negative.       Objective   BP (!) 142/76   Pulse 74   Ht 5' 5 (1.651 m)   Wt 90 lb 12.8 oz (41.2 kg)   SpO2 91%   BMI 15.11 kg/m   Physical Exam Vitals and nursing note reviewed.  Constitutional:      Appearance: Normal appearance. She is normal weight.  HENT:     Head: Normocephalic and atraumatic.     Nose: Nose normal.     Mouth/Throat:     Mouth: Mucous membranes are moist.  Eyes:     Extraocular Movements: Extraocular movements intact.     Conjunctiva/sclera: Conjunctivae normal.     Pupils: Pupils are equal, round, and reactive to light.  Cardiovascular:     Rate and Rhythm: Normal rate and regular rhythm.     Pulses: Normal pulses.     Heart sounds: Normal heart sounds.  Pulmonary:     Effort: Pulmonary effort is normal.     Breath sounds: Normal breath sounds.  Abdominal:     General: Abdomen is flat. Bowel sounds are normal.     Palpations: Abdomen is soft.  Musculoskeletal:        General: Normal range of motion.     Cervical back: Normal range of motion.  Skin:    General: Skin is warm and dry.  Neurological:     General: No focal deficit present.     Mental Status: She is alert and oriented to person, place, and time.  Psychiatric:        Mood and Affect: Mood normal.        Behavior: Behavior normal.        Thought Content: Thought content normal.        Judgment:  Judgment normal.       Assessment & Plan:  Referral sent to GI. Continue same medications.  Problem List Items Addressed This Visit       Cardiovascular and  Mediastinum   Ischemic bowel disease (HCC)   Relevant Medications   cholestyramine  (QUESTRAN ) 4 GM/DOSE powder   amLODipine  (NORVASC ) 10 MG tablet   atorvastatin  (LIPITOR) 40 MG tablet   lisinopril  (ZESTRIL ) 40 MG tablet   Other Relevant Orders   Ambulatory referral to Gastroenterology   Intestinal angina (HCC) - Primary   Relevant Medications   cholestyramine  (QUESTRAN ) 4 GM/DOSE powder   amLODipine  (NORVASC ) 10 MG tablet   atorvastatin  (LIPITOR) 40 MG tablet   lisinopril  (ZESTRIL ) 40 MG tablet   Other Relevant Orders   Ambulatory referral to Gastroenterology     Other   Tobacco abuse counseling   Relevant Medications   buPROPion  ER (WELLBUTRIN  SR) 100 MG 12 hr tablet   Encounter to establish care   Other Visit Diagnoses       Seasonal allergic rhinitis due to pollen       Relevant Medications   fluticasone  (FLONASE ) 50 MCG/ACT nasal spray       Return in about 3 weeks (around 09/25/2023).   Total time spent: 25 minutes  Google, NP  09/04/2023   This document may have been prepared by Dragon Voice Recognition software and as such may include unintentional dictation errors.

## 2023-09-26 ENCOUNTER — Ambulatory Visit: Admitting: Cardiology

## 2023-10-03 ENCOUNTER — Ambulatory Visit: Admitting: Cardiology

## 2023-10-12 ENCOUNTER — Other Ambulatory Visit: Payer: Self-pay | Admitting: Cardiology

## 2023-10-24 ENCOUNTER — Ambulatory Visit: Admitting: Cardiology

## 2023-10-24 ENCOUNTER — Ambulatory Visit
Admission: RE | Admit: 2023-10-24 | Discharge: 2023-10-24 | Disposition: A | Discharge status: Home / Self Care | Attending: Cardiology | Admitting: Cardiology

## 2023-10-24 ENCOUNTER — Encounter: Payer: Self-pay | Admitting: Cardiology

## 2023-10-24 ENCOUNTER — Ambulatory Visit
Admission: RE | Admit: 2023-10-24 | Discharge: 2023-10-24 | Disposition: A | Source: Ambulatory Visit | Attending: Cardiology

## 2023-10-24 VITALS — BP 137/89 | HR 73 | Ht 65.0 in | Wt 90.0 lb

## 2023-10-24 DIAGNOSIS — R071 Chest pain on breathing: Secondary | ICD-10-CM | POA: Diagnosis present

## 2023-10-24 DIAGNOSIS — J410 Simple chronic bronchitis: Secondary | ICD-10-CM | POA: Diagnosis present

## 2023-10-24 MED ORDER — KETOROLAC TROMETHAMINE 10 MG PO TABS: 10.0000 mg | ORAL_TABLET | Freq: Four times a day (QID) | ORAL | 0 refills | Status: DC | PRN

## 2023-10-24 NOTE — Progress Notes (Signed)
 Established Patient Office Visit  Subjective:  Patient ID: [name] [name], female    DOB: 15-Apr-1955  Age: 68 y.o. MRN: [id number]  Chief Complaint  Patient presents with   Acute Visit    Back pain started a week ago. Pt states it hurts when she breathes.     Patient in office for an acute visit, complaining of back pain, started 1 week ago. Patient reports pain when she breathes. Patient has been taking ibuprofen with no relief. Patient allergic to aspirin, tolerates ibuprofen. Will send in Toradol.  CT angio chest 10/2022 revealed 1. No evidence for pulmonary embolism. 2. Stable spiculated nodule in the right upper lobe. Stable focal density in the left upper lobe. Findings are concerning for malignancy. 3. Stable subsolid nodule in the right upper lobe measuring 6 mm. Following with [facility] for Malignant neoplasm of upper lobe of right lung. Will get a chest xray today to look for changes. Continue all other medications.  Patient understands if pain worsens, she is to go to the ED.   Back Pain This is a new problem. The current episode started in the past 7 days. The problem occurs constantly. The problem has been gradually worsening since onset. The quality of the pain is described as aching. The pain does not radiate. The pain is at a severity of 9/10. The pain is severe. The pain is The same all the time. The symptoms are aggravated by bending, coughing, position, standing and twisting. Associated symptoms include chest pain. Pertinent negatives include no abdominal pain or headaches. Risk factors include history of cancer, lack of exercise, poor posture and sedentary lifestyle.    No other concerns at this time.   Past Medical History:  Diagnosis Date   Bowel obstruction (HCC)    x1 month ago   Cancer [facility])    stomach cancer per pt and currently lung ca   COPD (chronic obstructive pulmonary disease) (HCC)    Essential hypertension 12/02/2019   Lung cancer (HCC)    Personal  history of radiation therapy 2019-2020   lung ca    Past Surgical History:  Procedure Laterality Date   COLON SURGERY      Social History   Socioeconomic History   Marital status: Widowed    Spouse name: Not on file   Number of children: 2   Years of education: 74   Highest education level: Not on file  Occupational History   Occupation: on disability  Tobacco Use   Smoking status: Every Day    Current packs/day: 0.25    Types: Cigarettes   Smokeless tobacco: Never  Vaping Use   Vaping status: Never Used  Substance and Sexual Activity   Alcohol use: Yes    Comment: occas   Drug use: No   Sexual activity: Not Currently    Birth control/protection: Post-menopausal  Other Topics Concern   Not on file  Social History Narrative   Not on file   Social Drivers of Health   Financial Resource Strain: High Risk (01/11/2021)   Overall Financial Resource Strain (CARDIA)    Difficulty of Paying Living Expenses: Hard  Food Insecurity: No Food Insecurity (11/22/2022)   Hunger Vital Sign    Worried About Running Out of Food in the Last Year: Never true    Ran Out of Food in the Last Year: Never true  Transportation Needs: No Transportation Needs (11/22/2022)   PRAPARE - Administrator, Civil Service (Medical): No  Lack of Transportation (Non-Medical): No  Physical Activity: Insufficiently Active (01/11/2021)   Exercise Vital Sign    Days of Exercise per Week: 2 days    Minutes of Exercise per Session: 30 min  Stress: Stress Concern Present (01/11/2021)   [employer] of Occupational Health - Occupational Stress Questionnaire    Feeling of Stress : To some extent  Social Connections: Socially Isolated (01/11/2021)   Social Connection and Isolation Panel    Frequency of Communication with Friends and Family: Once a week    Frequency of Social Gatherings with Friends and Family: Once a week    Attends Religious Services: Never    Database administrator or  Organizations: No    Attends Banker Meetings: Never    Marital Status: Widowed  Intimate Partner Violence: Not At Risk (11/22/2022)   Humiliation, Afraid, Rape, and Kick questionnaire    Fear of Current or Ex-Partner: No    Emotionally Abused: No    Physically Abused: No    Sexually Abused: No    Family History  Problem Relation Age of Onset   Lupus Sister    Heart failure Neg Hx    Diabetes Neg Hx     Allergies  Allergen Reactions   Aspirin Hives and Itching    Outpatient Medications Prior to Visit  Medication Sig   acetaminophen  (TYLENOL ) 160 MG/5ML solution Take 5 mLs (160 mg total) by mouth every 6 (six) hours as needed for mild pain.   albuterol  (PROVENTIL ) (2.5 MG/3ML) 0.083% nebulizer solution Take 3 mLs (2.5 mg total) by nebulization 3 (three) times daily as needed for wheezing.   albuterol  (VENTOLIN  HFA) 108 (90 Base) MCG/ACT inhaler Inhale 1 puff into the lungs 2 (two) times daily as needed.   amLODipine  (NORVASC ) 10 MG tablet TAKE 1 TABLET BY MOUTH ONCE DAILY   atorvastatin  (LIPITOR) 40 MG tablet Take 1 tablet (40 mg total) by mouth daily.   benzonatate  (TESSALON ) 100 MG capsule TAKE 2 CAPSULES BY MOUTH 3 TIMES DAILY AS NEEDED FOR COUGH   buPROPion  ER (WELLBUTRIN  SR) 100 MG 12 hr tablet Take 1 tablet (100 mg total) by mouth 2 (two) times daily.   cholestyramine  (QUESTRAN ) 4 GM/DOSE powder MIX AND DISSOLVE 4 GRAMS IN LIQUID AND DRINK ONCE DAILY. USE AS DIRECTED.   cyanocobalamin  (VITAMIN B12) 250 MCG tablet Take 1 tablet (250 mcg total) by mouth daily.   cycloSPORINE  (RESTASIS ) 0.05 % ophthalmic emulsion Place 1 drop into both eyes every 12 (twelve) hours.   feeding supplement, ENSURE ENLIVE, (ENSURE ENLIVE) LIQD Take 237 mLs by mouth 2 (two) times daily between meals.   fluticasone  (FLONASE ) 50 MCG/ACT nasal spray USE 1 SPRAY IN EACH NOSTRIL 3 TIMES DAILY   hydrOXYzine (ATARAX/VISTARIL) 25 MG tablet Take 25 mg by mouth 3 (three) times daily as needed.    lipase/protease/amylase (CREON ) 12000-38000 units CPEP capsule Take 1 capsule (12,000 Units total) by mouth 3 (three) times daily before meals.   lisinopril  (ZESTRIL ) 40 MG tablet Take 1 tablet (40 mg total) by mouth daily.   mirtazapine  (REMERON ) 15 MG tablet Take 1 tablet (15 mg total) by mouth daily.   ondansetron  (ZOFRAN ) 4 MG tablet Take 1 tablet (4 mg total) by mouth every 8 (eight) hours as needed for up to 10 doses for nausea or vomiting.   pantoprazole  (PROTONIX ) 40 MG tablet Take 1 tablet (40 mg total) by mouth daily.   polyethylene glycol (MIRALAX  / GLYCOLAX ) 17 g packet Take 17 g  by mouth daily as needed for mild constipation.   pregabalin  (LYRICA ) 100 MG capsule Take 100 mg by mouth 3 (three) times daily.    QUEtiapine  (SEROQUEL ) 100 MG tablet Take 1 tablet (100 mg total) by mouth at bedtime.   sertraline  (ZOLOFT ) 50 MG tablet Take 1 tablet (50 mg total) by mouth 2 (two) times daily.   Vitamin D, Ergocalciferol, (DRISDOL) 1.25 MG (50000 UNIT) CAPS capsule TAKE 1 CAPSULE BY MOUTH WEEKLY   bisacodyl  (DULCOLAX) 5 MG EC tablet Take 1 tablet (5 mg total) by mouth daily as needed for moderate constipation. (Patient not taking: Reported on 10/24/2023)   docusate sodium  (COLACE) 100 MG capsule Take 1 capsule (100 mg total) by mouth 2 (two) times daily. (Patient not taking: Reported on 10/24/2023)   No facility-administered medications prior to visit.    Review of Systems  Constitutional: Negative.   HENT: Negative.    Eyes: Negative.   Respiratory:  Positive for shortness of breath.   Cardiovascular:  Positive for chest pain.  Gastrointestinal: Negative.  Negative for abdominal pain, constipation and diarrhea.  Genitourinary: Negative.   Musculoskeletal:  Positive for back pain. Negative for joint pain and myalgias.  Skin: Negative.   Neurological: Negative.  Negative for dizziness and headaches.  Endo/Heme/Allergies: Negative.   All other systems reviewed and are negative.       Objective:   BP 137/89   Pulse 73   Ht 5' 5 (1.651 m)   Wt 90 lb (40.8 kg)   SpO2 97%   BMI 14.98 kg/m   Vitals:   10/24/23 1040  BP: 137/89  Pulse: 73  Height: 5' 5 (1.651 m)  Weight: 90 lb (40.8 kg)  SpO2: 97%  BMI (Calculated): 14.98    Physical Exam Vitals and nursing note reviewed.  Constitutional:      Appearance: Normal appearance. She is normal weight.  HENT:     Head: Normocephalic and atraumatic.     Nose: Nose normal.     Mouth/Throat:     Mouth: Mucous membranes are moist.  Eyes:     Extraocular Movements: Extraocular movements intact.     Conjunctiva/sclera: Conjunctivae normal.     Pupils: Pupils are equal, round, and reactive to light.  Cardiovascular:     Rate and Rhythm: Normal rate and regular rhythm.     Pulses: Normal pulses.     Heart sounds: Normal heart sounds.  Pulmonary:     Effort: Pulmonary effort is normal.     Breath sounds: Normal breath sounds.  Abdominal:     General: Abdomen is flat. Bowel sounds are normal.     Palpations: Abdomen is soft.  Musculoskeletal:        General: Normal range of motion.     Cervical back: Normal range of motion.  Skin:    General: Skin is warm and dry.  Neurological:     General: No focal deficit present.     Mental Status: She is alert and oriented to person, place, and time.  Psychiatric:        Mood and Affect: Mood normal.        Behavior: Behavior normal.        Thought Content: Thought content normal.        Judgment: Judgment normal.      No results found for any visits on 10/24/23.  No results found for this or any previous visit (from the past 2160 hours).    Assessment & Plan:  Chest xray today. Toradol for pain  Problem List Items Addressed This Visit       Respiratory   COPD (chronic obstructive pulmonary disease) (HCC) - Primary (Chronic)   Relevant Orders   DG Chest 2 View     Other   Chest pain on breathing   Relevant Orders   DG Chest 2 View    Return  in about 4 weeks (around 11/21/2023).   Total time spent: 25 minutes  Google, NP  10/24/2023   This document may have been prepared by Dragon Voice Recognition software and as such may include unintentional dictation errors.

## 2023-10-30 ENCOUNTER — Ambulatory Visit: Payer: Self-pay | Admitting: Cardiology

## 2023-10-31 NOTE — Progress Notes (Signed)
 Patient informed.

## 2023-11-16 ENCOUNTER — Encounter: Payer: Self-pay | Admitting: Student in an Organized Health Care Education/Training Program

## 2023-11-16 ENCOUNTER — Other Ambulatory Visit: Payer: Self-pay | Admitting: Cardiology

## 2023-11-16 DIAGNOSIS — J410 Simple chronic bronchitis: Secondary | ICD-10-CM

## 2023-11-16 NOTE — Progress Notes (Signed)
Pt informed

## 2023-11-16 NOTE — Progress Notes (Signed)
 Pt has not been to pulmonologist.

## 2023-11-22 ENCOUNTER — Ambulatory Visit: Admitting: Cardiology

## 2023-11-24 ENCOUNTER — Ambulatory Visit (INDEPENDENT_AMBULATORY_CARE_PROVIDER_SITE_OTHER): Admitting: Cardiology

## 2023-11-24 ENCOUNTER — Ambulatory Visit: Admitting: Cardiology

## 2023-11-24 VITALS — BP 124/80 | HR 84 | Ht 65.0 in | Wt 88.4 lb

## 2023-11-24 DIAGNOSIS — R1084 Generalized abdominal pain: Secondary | ICD-10-CM

## 2023-11-24 DIAGNOSIS — Z23 Encounter for immunization: Secondary | ICD-10-CM

## 2023-11-24 DIAGNOSIS — K559 Vascular disorder of intestine, unspecified: Secondary | ICD-10-CM | POA: Diagnosis not present

## 2023-11-24 DIAGNOSIS — K551 Chronic vascular disorders of intestine: Secondary | ICD-10-CM | POA: Diagnosis not present

## 2023-11-24 DIAGNOSIS — J301 Allergic rhinitis due to pollen: Secondary | ICD-10-CM

## 2023-11-24 DIAGNOSIS — Z0130 Encounter for examination of blood pressure without abnormal findings: Secondary | ICD-10-CM

## 2023-11-24 MED ORDER — CHOLESTYRAMINE 4 GM/DOSE PO POWD: 4.0000 g | Freq: Every day | ORAL | 1 refills | Status: AC

## 2023-11-24 MED ORDER — PANTOPRAZOLE SODIUM 40 MG PO TBEC: 40.0000 mg | DELAYED_RELEASE_TABLET | Freq: Every day | ORAL | 1 refills | Status: DC

## 2023-11-24 MED ORDER — FLUTICASONE PROPIONATE 50 MCG/ACT NA SUSP: NASAL | 6 refills | Status: AC

## 2023-11-24 MED ORDER — VITAMIN D (ERGOCALCIFEROL) 1.25 MG (50000 UNIT) PO CAPS: 50000.0000 [IU] | ORAL_CAPSULE | ORAL | 4 refills | Status: AC

## 2023-11-24 MED ORDER — KETOROLAC TROMETHAMINE 10 MG PO TABS: 10.0000 mg | ORAL_TABLET | Freq: Four times a day (QID) | ORAL | 0 refills | Status: AC | PRN

## 2023-11-24 MED ORDER — CYANOCOBALAMIN 250 MCG PO TABS: 250.0000 ug | ORAL_TABLET | Freq: Every day | ORAL | 6 refills | Status: AC

## 2023-11-24 MED ORDER — LISINOPRIL 40 MG PO TABS: 40.0000 mg | ORAL_TABLET | Freq: Every day | ORAL | 3 refills | Status: AC

## 2023-11-24 MED ORDER — PANCRELIPASE (LIP-PROT-AMYL) 12000-38000 UNITS PO CPEP: 12000.0000 [IU] | ORAL_CAPSULE | Freq: Three times a day (TID) | ORAL | 0 refills | Status: DC

## 2023-11-24 NOTE — Progress Notes (Signed)
 Established Patient Office Visit  Subjective:  Patient ID: [name] [name], female    DOB: [birth year]  Age: 68 y.o. MRN: [id number]  Chief Complaint  Patient presents with   Acute Visit    Lower back pain past a level of 10 radiating to her left side, chest pain when coughing.Having difficulty digesting food she said when she eats it feels like it gets stuck she has been having watery diarrhea for a month as well as indigestion, also experiencing weight loss no lack of appetite. Flu shot     Patient in office for an acute visit. Patient complaining of lower back pain past a level of 10 radiating to her left side, chest pain when coughing. Patient reports having difficulty digesting food, said when she eats, it feels like it gets stuck in her chest. She has been having watery diarrhea for a month as well as indigestion. Also, experiencing weight loss with no lack of appetite. Patient also requesting a flu vaccine today.  Patient has a history of ischemic bowel. Referral sent to GI previously. Patient did not hear from them to schedule, will send a new referral.     No other concerns at this time.   Past Medical History:  Diagnosis Date   Bowel obstruction (HCC)    x1 month ago   Cancer [facility])    stomach cancer per pt and currently lung ca   COPD (chronic obstructive pulmonary disease) (HCC)    Essential hypertension 12/02/2019   Lung cancer (HCC)    Personal history of radiation therapy 2019-2020   lung ca    Past Surgical History:  Procedure Laterality Date   COLON SURGERY      Social History   Socioeconomic History   Marital status: Widowed    Spouse name: Not on file   Number of children: 2   Years of education: 51   Highest education level: Not on file  Occupational History   Occupation: on disability  Tobacco Use   Smoking status: Every Day    Current packs/day: 0.25    Types: Cigarettes   Smokeless tobacco: Never  Vaping Use   Vaping status: Never Used   Substance and Sexual Activity   Alcohol use: Yes    Comment: occas   Drug use: No   Sexual activity: Not Currently    Birth control/protection: Post-menopausal  Other Topics Concern   Not on file  Social History Narrative   Not on file   Social Drivers of Health   Financial Resource Strain: High Risk (01/11/2021)   Overall Financial Resource Strain (CARDIA)    Difficulty of Paying Living Expenses: Hard  Food Insecurity: No Food Insecurity (11/22/2022)   Hunger Vital Sign    Worried About Running Out of Food in the Last Year: Never true    Ran Out of Food in the Last Year: Never true  Transportation Needs: No Transportation Needs (11/22/2022)   PRAPARE - Administrator, Civil Service (Medical): No    Lack of Transportation (Non-Medical): No  Physical Activity: Insufficiently Active (01/11/2021)   Exercise Vital Sign    Days of Exercise per Week: 2 days    Minutes of Exercise per Session: 30 min  Stress: Stress Concern Present (01/11/2021)   [employer] of Occupational [employer] - Occupational Stress Questionnaire    Feeling of Stress : To some extent  Social Connections: Socially Isolated (01/11/2021)   Social Connection and Isolation Panel    Frequency of Communication  with Friends and Family: Once a week    Frequency of Social Gatherings with Friends and Family: Once a week    Attends Religious Services: Never    Database Administrator or Organizations: No    Attends Banker Meetings: Never    Marital Status: Widowed  Intimate Partner Violence: Not At Risk (11/22/2022)   Humiliation, Afraid, Rape, and Kick questionnaire    Fear of Current or Ex-Partner: No    Emotionally Abused: No    Physically Abused: No    Sexually Abused: No    Family History  Problem Relation Age of Onset   Lupus Sister    Heart failure Neg Hx    Diabetes Neg Hx     Allergies  Allergen Reactions   Aspirin Hives and Itching    Outpatient Medications Prior to  Visit  Medication Sig   albuterol  (PROVENTIL ) (2.5 MG/3ML) 0.083% nebulizer solution Take 3 mLs (2.5 mg total) by nebulization 3 (three) times daily as needed for wheezing.   albuterol  (VENTOLIN  HFA) 108 (90 Base) MCG/ACT inhaler Inhale 1 puff into the lungs 2 (two) times daily as needed.   amLODipine  (NORVASC ) 10 MG tablet TAKE 1 TABLET BY MOUTH ONCE DAILY   benzonatate  (TESSALON ) 100 MG capsule TAKE 2 CAPSULES BY MOUTH 3 TIMES DAILY AS NEEDED FOR COUGH   buPROPion  ER (WELLBUTRIN  SR) 100 MG 12 hr tablet Take 1 tablet (100 mg total) by mouth 2 (two) times daily.   cycloSPORINE  (RESTASIS ) 0.05 % ophthalmic emulsion Place 1 drop into both eyes every 12 (twelve) hours.   feeding supplement, ENSURE ENLIVE, (ENSURE ENLIVE) LIQD Take 237 mLs by mouth 2 (two) times daily between meals.   hydrOXYzine (ATARAX/VISTARIL) 25 MG tablet Take 25 mg by mouth 3 (three) times daily as needed.   ibuprofen (ADVIL) 100 MG/5ML suspension Take 200 mg by mouth every 4 (four) hours as needed for moderate pain (pain score 4-6).   mirtazapine  (REMERON ) 15 MG tablet Take 1 tablet (15 mg total) by mouth daily.   ondansetron  (ZOFRAN ) 4 MG tablet Take 1 tablet (4 mg total) by mouth every 8 (eight) hours as needed for up to 10 doses for nausea or vomiting.   pregabalin  (LYRICA ) 100 MG capsule Take 100 mg by mouth 3 (three) times daily.    QUEtiapine  (SEROQUEL ) 100 MG tablet Take 1 tablet (100 mg total) by mouth at bedtime.   [DISCONTINUED] atorvastatin  (LIPITOR) 40 MG tablet Take 1 tablet (40 mg total) by mouth daily.   [DISCONTINUED] cholestyramine  (QUESTRAN ) 4 GM/DOSE powder MIX AND DISSOLVE 4 GRAMS IN LIQUID AND DRINK ONCE DAILY. USE AS DIRECTED.   [DISCONTINUED] cyanocobalamin  (VITAMIN B12) 250 MCG tablet Take 1 tablet (250 mcg total) by mouth daily.   [DISCONTINUED] fluticasone  (FLONASE ) 50 MCG/ACT nasal spray USE 1 SPRAY IN EACH NOSTRIL 3 TIMES DAILY   [DISCONTINUED] ketorolac (TORADOL) 10 MG tablet Take 1 tablet (10 mg  total) by mouth every 6 (six) hours as needed.   [DISCONTINUED] lipase/protease/amylase (CREON ) 12000-38000 units CPEP capsule Take 1 capsule (12,000 Units total) by mouth 3 (three) times daily before meals.   [DISCONTINUED] lisinopril  (ZESTRIL ) 40 MG tablet Take 1 tablet (40 mg total) by mouth daily.   [DISCONTINUED] pantoprazole  (PROTONIX ) 40 MG tablet Take 1 tablet (40 mg total) by mouth daily.   [DISCONTINUED] sertraline  (ZOLOFT ) 50 MG tablet Take 1 tablet (50 mg total) by mouth 2 (two) times daily.   [DISCONTINUED] Vitamin D, Ergocalciferol, (DRISDOL) 1.25 MG (50000 UNIT) CAPS  capsule TAKE 1 CAPSULE BY MOUTH WEEKLY   acetaminophen  (TYLENOL ) 160 MG/5ML solution Take 5 mLs (160 mg total) by mouth every 6 (six) hours as needed for mild pain. (Patient not taking: Reported on 11/24/2023)   bisacodyl  (DULCOLAX) 5 MG EC tablet Take 1 tablet (5 mg total) by mouth daily as needed for moderate constipation. (Patient not taking: Reported on 11/24/2023)   docusate sodium  (COLACE) 100 MG capsule Take 1 capsule (100 mg total) by mouth 2 (two) times daily. (Patient not taking: Reported on 11/24/2023)   polyethylene glycol (MIRALAX  / GLYCOLAX ) 17 g packet Take 17 g by mouth daily as needed for mild constipation. (Patient not taking: Reported on 11/24/2023)   No facility-administered medications prior to visit.    Review of Systems  Constitutional: Negative.   HENT: Negative.    Eyes: Negative.   Respiratory: Negative.  Negative for shortness of breath.   Cardiovascular: Negative.  Negative for chest pain.  Gastrointestinal:  Positive for heartburn and nausea. Negative for abdominal pain, constipation and diarrhea.  Genitourinary: Negative.   Musculoskeletal:  Positive for back pain. Negative for joint pain and myalgias.  Skin: Negative.   Neurological: Negative.  Negative for dizziness and headaches.  Endo/Heme/Allergies: Negative.   All other systems reviewed and are negative.      Objective:    BP 124/80 (BP Location: Left Arm, Patient Position: Sitting, Cuff Size: Small)   Pulse 84   Ht 5' 5 (1.651 m)   Wt 88 lb 6.4 oz (40.1 kg)   SpO2 96%   BMI 14.71 kg/m   Vitals:   11/24/23 1334 11/24/23 1401  BP: (!) 166/88 124/80  Pulse: 84   Height: 5' 5 (1.651 m)   Weight: 88 lb 6.4 oz (40.1 kg)   SpO2: 96%   BMI (Calculated): 14.71     Physical Exam Vitals and nursing note reviewed.  Constitutional:      Appearance: Normal appearance. She is normal weight.  HENT:     Head: Normocephalic and atraumatic.     Nose: Nose normal.     Mouth/Throat:     Mouth: Mucous membranes are moist.  Eyes:     Extraocular Movements: Extraocular movements intact.     Conjunctiva/sclera: Conjunctivae normal.     Pupils: Pupils are equal, round, and reactive to light.  Cardiovascular:     Rate and Rhythm: Normal rate and regular rhythm.     Pulses: Normal pulses.     Heart sounds: Normal heart sounds.  Pulmonary:     Effort: Pulmonary effort is normal.     Breath sounds: Normal breath sounds.  Abdominal:     General: Abdomen is flat. Bowel sounds are normal.     Palpations: Abdomen is soft.  Musculoskeletal:        General: Normal range of motion.     Cervical back: Normal range of motion.  Skin:    General: Skin is warm and dry.  Neurological:     General: No focal deficit present.     Mental Status: She is alert and oriented to person, place, and time.  Psychiatric:        Mood and Affect: Mood normal.        Behavior: Behavior normal.        Thought Content: Thought content normal.        Judgment: Judgment normal.      No results found for any visits on 11/24/23.  No results found for this or any  previous visit (from the past 2160 hours).    Assessment & Plan:  Referral sent to GI Flu vaccine today  Problem List Items Addressed This Visit       Cardiovascular and Mediastinum   Ischemic bowel disease - Primary   Relevant Medications   cholestyramine   (QUESTRAN ) 4 GM/DOSE powder   lisinopril  (ZESTRIL ) 40 MG tablet   Other Relevant Orders   Ambulatory referral to Gastroenterology   Intestinal angina   Relevant Medications   cholestyramine  (QUESTRAN ) 4 GM/DOSE powder   lisinopril  (ZESTRIL ) 40 MG tablet   Other Relevant Orders   Ambulatory referral to Gastroenterology     Respiratory   Seasonal allergic rhinitis due to pollen   Relevant Medications   fluticasone  (FLONASE ) 50 MCG/ACT nasal spray     Other   Abdominal pain   Relevant Orders   Ambulatory referral to Gastroenterology   Other Visit Diagnoses       Flu vaccine need       Relevant Orders   Flu Vaccine Trivalent High Dose (Fluad) (Completed)       Return if symptoms worsen or fail to improve, for as previosuly scheduled.   Total time spent: 25 minutes. This time includes review of previous notes and results and patient face to face interaction during today's visit.    Jeoffrey Pollen, NP  11/24/2023   This document may have been prepared by Dragon Voice Recognition software and as such may include unintentional dictation errors.

## 2023-11-25 ENCOUNTER — Other Ambulatory Visit: Payer: Self-pay | Admitting: Cardiology

## 2023-11-27 ENCOUNTER — Telehealth: Payer: Self-pay

## 2023-11-27 NOTE — Telephone Encounter (Signed)
 Patient LM asking for refill on ketoralac t, looks like the medication is requiring a PA, will get this worked on

## 2023-11-28 DIAGNOSIS — Z23 Encounter for immunization: Secondary | ICD-10-CM | POA: Diagnosis not present

## 2023-12-01 ENCOUNTER — Encounter: Payer: Self-pay | Admitting: Cardiology

## 2023-12-01 DIAGNOSIS — J301 Allergic rhinitis due to pollen: Secondary | ICD-10-CM | POA: Insufficient documentation

## 2024-01-13 ENCOUNTER — Other Ambulatory Visit: Payer: Self-pay | Admitting: Cardiology

## 2024-02-08 ENCOUNTER — Ambulatory Visit (INDEPENDENT_AMBULATORY_CARE_PROVIDER_SITE_OTHER): Admitting: Cardiology

## 2024-02-08 ENCOUNTER — Encounter: Payer: Self-pay | Admitting: Cardiology

## 2024-02-08 ENCOUNTER — Other Ambulatory Visit: Payer: Self-pay | Admitting: Cardiology

## 2024-02-08 VITALS — BP 132/80 | HR 74 | Ht 65.0 in | Wt 94.8 lb

## 2024-02-08 DIAGNOSIS — Z1329 Encounter for screening for other suspected endocrine disorder: Secondary | ICD-10-CM | POA: Diagnosis not present

## 2024-02-08 DIAGNOSIS — E440 Moderate protein-calorie malnutrition: Secondary | ICD-10-CM

## 2024-02-08 DIAGNOSIS — Z13220 Encounter for screening for lipoid disorders: Secondary | ICD-10-CM

## 2024-02-08 DIAGNOSIS — I10 Essential (primary) hypertension: Secondary | ICD-10-CM | POA: Diagnosis not present

## 2024-02-08 DIAGNOSIS — R531 Weakness: Secondary | ICD-10-CM | POA: Diagnosis not present

## 2024-02-08 DIAGNOSIS — M12812 Other specific arthropathies, not elsewhere classified, left shoulder: Secondary | ICD-10-CM | POA: Diagnosis not present

## 2024-02-08 DIAGNOSIS — Z131 Encounter for screening for diabetes mellitus: Secondary | ICD-10-CM | POA: Diagnosis not present

## 2024-02-08 MED ORDER — FUROSEMIDE 20 MG PO TABS: 20.0000 mg | ORAL_TABLET | Freq: Every day | ORAL | 0 refills | Status: AC

## 2024-02-08 NOTE — Progress Notes (Signed)
 "  Established Patient Office Visit  Subjective:  Patient ID: [name] [name], female    DOB: 10-17-1955  Age: 69 y.o. MRN: [id number]  Chief Complaint  Patient presents with   Follow-up    Leg/feet swelling, low back pain    Patient in office for an acute visit, patient complaining of lower back pain, leg and feet edema. Patient reports bilateral lower extremity edema has been going on for a few weeks, non pitting. Will send in furosemide  20 mg daily for 3 days, then PRN. Will do blood work today to check kidney function.  Patient complaining of lower back pain and bilateral shoulder pain. Patient uses a walker at home and when out of the house. Patient states pain started prior to using a walker, when using a cane. Will order PT for strengthening exercises.  Follow up as scheduled in 2 weeks.     No other concerns at this time.   Past Medical History:  Diagnosis Date   Bowel obstruction (HCC)    x1 month ago   Cancer [facility])    stomach cancer per pt and currently lung ca   COPD (chronic obstructive pulmonary disease) (HCC)    Essential hypertension 12/02/2019   Lung cancer (HCC)    Personal history of radiation therapy 2019-2020   lung ca    Past Surgical History:  Procedure Laterality Date   COLON SURGERY      Social History   Socioeconomic History   Marital status: Widowed    Spouse name: Not on file   Number of children: 2   Years of education: 61   Highest education level: Not on file  Occupational History   Occupation: on disability  Tobacco Use   Smoking status: Every Day    Current packs/day: 0.25    Types: Cigarettes   Smokeless tobacco: Never  Vaping Use   Vaping status: Never Used  Substance and Sexual Activity   Alcohol use: Yes    Comment: occas   Drug use: No   Sexual activity: Not Currently    Birth control/protection: Post-menopausal  Other Topics Concern   Not on file  Social History Narrative   Not on file   Social Drivers of Health    Tobacco Use: High Risk (02/08/2024)   Patient History    Smoking Tobacco Use: Every Day    Smokeless Tobacco Use: Never    Passive Exposure: Not on file  Financial Resource Strain: Not on file  Food Insecurity: No Food Insecurity (11/22/2022)   Hunger Vital Sign    Worried About Running Out of Food in the Last Year: Never true    Ran Out of Food in the Last Year: Never true  Transportation Needs: No Transportation Needs (11/22/2022)   PRAPARE - Administrator, Civil Service (Medical): No    Lack of Transportation (Non-Medical): No  Physical Activity: Not on file  Stress: Not on file  Social Connections: Not on file  Intimate Partner Violence: Not At Risk (11/22/2022)   Humiliation, Afraid, Rape, and Kick questionnaire    Fear of Current or Ex-Partner: No    Emotionally Abused: No    Physically Abused: No    Sexually Abused: No  Depression (PHQ2-9): High Risk (09/04/2023)   Depression (PHQ2-9)    PHQ-2 Score: 13  Alcohol Screen: Not on file  Housing: Low Risk (11/22/2022)   Housing    Last Housing Risk Score: 0  Utilities: Not At Risk (11/22/2022)   [facility]  Utilities    Threatened with loss of utilities: No  Health Literacy: Not on file    Family History  Problem Relation Age of Onset   Lupus Sister    Heart failure Neg Hx    Diabetes Neg Hx     Allergies[1]  Show/hide medication list[2]  Review of Systems  Constitutional: Negative.   HENT: Negative.    Eyes: Negative.   Respiratory: Negative.  Negative for shortness of breath.   Cardiovascular:  Positive for leg swelling. Negative for chest pain.  Gastrointestinal: Negative.  Negative for abdominal pain, constipation and diarrhea.  Genitourinary: Negative.   Musculoskeletal:  Positive for back pain. Negative for joint pain and myalgias.  Skin: Negative.   Neurological: Negative.  Negative for dizziness and headaches.  Endo/Heme/Allergies: Negative.   All other systems reviewed and are  negative.      Objective:   BP 132/80   Pulse 74   Ht 5' 5 (1.651 m)   Wt 94 lb 12.8 oz (43 kg)   SpO2 99%   BMI 15.78 kg/m   Vitals:   02/08/24 0945  BP: 132/80  Pulse: 74  Height: 5' 5 (1.651 m)  Weight: 94 lb 12.8 oz (43 kg)  SpO2: 99%  BMI (Calculated): 15.78    Physical Exam Vitals and nursing note reviewed.  Constitutional:      Appearance: Normal appearance. She is normal weight.  HENT:     Head: Normocephalic and atraumatic.     Nose: Nose normal.     Mouth/Throat:     Mouth: Mucous membranes are moist.  Eyes:     Extraocular Movements: Extraocular movements intact.     Conjunctiva/sclera: Conjunctivae normal.     Pupils: Pupils are equal, round, and reactive to light.  Cardiovascular:     Rate and Rhythm: Normal rate and regular rhythm.     Pulses: Normal pulses.     Heart sounds: Normal heart sounds.  Pulmonary:     Effort: Pulmonary effort is normal.     Breath sounds: Normal breath sounds.  Abdominal:     General: Abdomen is flat. Bowel sounds are normal.     Palpations: Abdomen is soft.  Musculoskeletal:        General: Normal range of motion.     Cervical back: Normal range of motion.  Skin:    General: Skin is warm and dry.  Neurological:     General: No focal deficit present.     Mental Status: She is alert and oriented to person, place, and time.  Psychiatric:        Mood and Affect: Mood normal.        Behavior: Behavior normal.        Thought Content: Thought content normal.        Judgment: Judgment normal.      No results found for any visits on 02/08/24.  No results found for this or any previous visit (from the past 2160 hours).    Assessment & Plan:  Furosemide  Lab work today Referral to home health for PT  Problem List Items Addressed This Visit       Cardiovascular and Mediastinum   Essential hypertension - Primary   Relevant Medications   furosemide  (LASIX ) 20 MG tablet   Other Relevant Orders   CMP14+EGFR      Nervous and Auditory   Left-sided weakness   Relevant Orders   Ambulatory referral to Home Health     Musculoskeletal and Integument  Rotator cuff arthropathy of left shoulder   Relevant Orders   Ambulatory referral to Home Health     Other   Malnutrition of moderate degree   Relevant Orders   CMP14+EGFR   CBC with Differential/Platelet   Other Visit Diagnoses       Diabetes mellitus screening       Relevant Orders   Hemoglobin A1c     Thyroid disorder screening       Relevant Orders   TSH     Lipid screening       Relevant Orders   Lipid Profile       Return if symptoms worsen or fail to improve, for as scheduled.   Total time spent: 25 minutes. This time includes review of previous notes and results and patient face to face interaction during today's visit.    Jeoffrey Pollen, NP  02/08/2024   This document may have been prepared by [facility] Voice Recognition software and as such may include unintentional dictation errors.     [1]  Allergies Allergen Reactions   Aspirin Hives and Itching  [2]  Outpatient Medications Prior to Visit  Medication Sig   acetaminophen  (TYLENOL ) 160 MG/5ML solution Take 5 mLs (160 mg total) by mouth every 6 (six) hours as needed for mild pain. (Patient not taking: Reported on 11/24/2023)   albuterol  (PROVENTIL ) (2.5 MG/3ML) 0.083% nebulizer solution Take 3 mLs (2.5 mg total) by nebulization 3 (three) times daily as needed for wheezing.   albuterol  (VENTOLIN  HFA) 108 (90 Base) MCG/ACT inhaler Inhale 1 puff into the lungs 2 (two) times daily as needed.   amLODipine  (NORVASC ) 10 MG tablet TAKE 1 TABLET BY MOUTH ONCE DAILY   atorvastatin  (LIPITOR) 40 MG tablet TAKE 1 TABLET BY MOUTH ONCE EVERY EVENING   benzonatate  (TESSALON ) 100 MG capsule TAKE 2 CAPSULES BY MOUTH 3 TIMES DAILY AS NEEDED FOR COUGH   bisacodyl  (DULCOLAX) 5 MG EC tablet Take 1 tablet (5 mg total) by mouth daily as needed for moderate constipation. (Patient not  taking: Reported on 11/24/2023)   buPROPion  ER (WELLBUTRIN  SR) 100 MG 12 hr tablet Take 1 tablet (100 mg total) by mouth 2 (two) times daily.   cholestyramine  (QUESTRAN ) 4 GM/DOSE powder Take 1 packet (4 g total) by mouth daily.   CREON  12000-38000 units CPEP capsule TAKE 1 CAPSULE BY MOUTH 3 TIMES DAILY BEFORE MEALS   cyanocobalamin  (VITAMIN B12) 250 MCG tablet Take 1 tablet (250 mcg total) by mouth daily.   cycloSPORINE  (RESTASIS ) 0.05 % ophthalmic emulsion Place 1 drop into both eyes every 12 (twelve) hours.   docusate sodium  (COLACE) 100 MG capsule Take 1 capsule (100 mg total) by mouth 2 (two) times daily. (Patient not taking: Reported on 11/24/2023)   feeding supplement, ENSURE ENLIVE, (ENSURE ENLIVE) LIQD Take 237 mLs by mouth 2 (two) times daily between meals.   fluticasone  (FLONASE ) 50 MCG/ACT nasal spray USE 1 SPRAY IN EACH NOSTRIL 3 TIMES DAILY   hydrOXYzine (ATARAX/VISTARIL) 25 MG tablet Take 25 mg by mouth 3 (three) times daily as needed.   ibuprofen (ADVIL) 100 MG/5ML suspension Take 200 mg by mouth every 4 (four) hours as needed for moderate pain (pain score 4-6).   ketorolac  (TORADOL ) 10 MG tablet Take 1 tablet (10 mg total) by mouth every 6 (six) hours as needed.   lisinopril  (ZESTRIL ) 40 MG tablet Take 1 tablet (40 mg total) by mouth daily.   mirtazapine  (REMERON ) 15 MG tablet Take 1 tablet (15 mg total) by  mouth daily.   ondansetron  (ZOFRAN ) 4 MG tablet Take 1 tablet (4 mg total) by mouth every 8 (eight) hours as needed for up to 10 doses for nausea or vomiting.   pantoprazole  (PROTONIX ) 40 MG tablet TAKE 1 TABLET BY MOUTH ONCE DAILY   polyethylene glycol (MIRALAX  / GLYCOLAX ) 17 g packet Take 17 g by mouth daily as needed for mild constipation. (Patient not taking: Reported on 11/24/2023)   pregabalin  (LYRICA ) 100 MG capsule Take 100 mg by mouth 3 (three) times daily.    QUEtiapine  (SEROQUEL ) 100 MG tablet Take 1 tablet (100 mg total) by mouth at bedtime.   sertraline  (ZOLOFT )  50 MG tablet TAKE 1 TABLET BY MOUTH TWICE DAILY   Vitamin D , Ergocalciferol , (DRISDOL ) 1.25 MG (50000 UNIT) CAPS capsule Take 1 capsule (50,000 Units total) by mouth once a week.   No facility-administered medications prior to visit.   "

## 2024-02-09 ENCOUNTER — Ambulatory Visit: Payer: Self-pay | Admitting: Cardiology

## 2024-02-09 LAB — LIPID PANEL
Chol/HDL Ratio: 2 ratio (ref 0.0–4.4)
Cholesterol, Total: 88 mg/dL — ABNORMAL LOW (ref 100–199)
HDL: 45 mg/dL
LDL Chol Calc (NIH): 32 mg/dL (ref 0–99)
Triglycerides: 39 mg/dL (ref 0–149)
VLDL Cholesterol Cal: 11 mg/dL (ref 5–40)

## 2024-02-09 LAB — CMP14+EGFR
ALT: 22 IU/L (ref 0–32)
AST: 15 IU/L (ref 0–40)
Albumin: 3.9 g/dL (ref 3.9–4.9)
Alkaline Phosphatase: 417 IU/L — ABNORMAL HIGH (ref 49–135)
BUN/Creatinine Ratio: 26 (ref 12–28)
BUN: 13 mg/dL (ref 8–27)
Bilirubin Total: 0.3 mg/dL (ref 0.0–1.2)
CO2: 19 mmol/L — ABNORMAL LOW (ref 20–29)
Calcium: 7.6 mg/dL — ABNORMAL LOW (ref 8.7–10.3)
Chloride: 112 mmol/L — ABNORMAL HIGH (ref 96–106)
Creatinine, Ser: 0.5 mg/dL — ABNORMAL LOW (ref 0.57–1.00)
Globulin, Total: 2.1 g/dL (ref 1.5–4.5)
Glucose: 87 mg/dL (ref 70–99)
Potassium: 4 mmol/L (ref 3.5–5.2)
Sodium: 143 mmol/L (ref 134–144)
Total Protein: 6 g/dL (ref 6.0–8.5)
eGFR: 103 mL/min/1.73

## 2024-02-09 LAB — CBC WITH DIFFERENTIAL/PLATELET
Basophils Absolute: 0 x10E3/uL (ref 0.0–0.2)
Basos: 0 %
EOS (ABSOLUTE): 0 x10E3/uL (ref 0.0–0.4)
Eos: 0 %
Hematocrit: 36.7 % (ref 34.0–46.6)
Hemoglobin: 12.1 g/dL (ref 11.1–15.9)
Immature Grans (Abs): 0 x10E3/uL (ref 0.0–0.1)
Immature Granulocytes: 0 %
Lymphocytes Absolute: 1.8 x10E3/uL (ref 0.7–3.1)
Lymphs: 25 %
MCH: 34.3 pg — ABNORMAL HIGH (ref 26.6–33.0)
MCHC: 33 g/dL (ref 31.5–35.7)
MCV: 104 fL — ABNORMAL HIGH (ref 79–97)
Monocytes Absolute: 0.4 x10E3/uL (ref 0.1–0.9)
Monocytes: 6 %
Neutrophils Absolute: 4.7 x10E3/uL (ref 1.4–7.0)
Neutrophils: 69 %
Platelets: 190 x10E3/uL (ref 150–450)
RBC: 3.53 x10E6/uL — ABNORMAL LOW (ref 3.77–5.28)
RDW: 11.4 % — ABNORMAL LOW (ref 11.7–15.4)
WBC: 6.9 x10E3/uL (ref 3.4–10.8)

## 2024-02-09 LAB — HEMOGLOBIN A1C
Est. average glucose Bld gHb Est-mCnc: 88 mg/dL
Hgb A1c MFr Bld: 4.7 % — ABNORMAL LOW (ref 4.8–5.6)

## 2024-02-09 LAB — TSH: TSH: 0.773 u[IU]/mL (ref 0.450–4.500)

## 2024-02-14 ENCOUNTER — Telehealth: Payer: Self-pay

## 2024-02-15 NOTE — Telephone Encounter (Signed)
 Angie returned call for verbal order for the patient, PT 1W x 9W and OT eval in 3 weeks  please call her back with verbal okay [phone]

## 2024-02-23 ENCOUNTER — Ambulatory Visit: Admitting: Cardiology

## 2024-02-29 ENCOUNTER — Ambulatory Visit: Admitting: Cardiology

## 2024-03-01 ENCOUNTER — Encounter: Payer: Self-pay | Admitting: Cardiology

## 2024-03-01 ENCOUNTER — Ambulatory Visit: Admitting: Cardiology

## 2024-03-01 VITALS — BP 130/80 | HR 86 | Ht 65.0 in

## 2024-03-01 DIAGNOSIS — I10 Essential (primary) hypertension: Secondary | ICD-10-CM

## 2024-03-01 DIAGNOSIS — J22 Unspecified acute lower respiratory infection: Secondary | ICD-10-CM | POA: Insufficient documentation

## 2024-03-01 MED ORDER — METHYLPREDNISOLONE 4 MG PO TBPK: ORAL_TABLET | ORAL | 0 refills | Status: AC

## 2024-03-01 MED ORDER — BENZONATATE 100 MG PO CAPS: 100.0000 mg | ORAL_CAPSULE | Freq: Three times a day (TID) | ORAL | 0 refills | Status: AC | PRN

## 2024-03-01 MED ORDER — AMOXICILLIN-POT CLAVULANATE 875-125 MG PO TABS: 1.0000 | ORAL_TABLET | Freq: Two times a day (BID) | ORAL | 0 refills | Status: AC

## 2024-03-01 NOTE — Progress Notes (Signed)
 "  Established Patient Office Visit  Subjective:  Patient ID: [name] [name], female    DOB: 09-15-1955  Age: 69 y.o. MRN: [id number]  Chief Complaint  Patient presents with   Acute Visit    Pain getting worse from last visit still coughing, and congested no fever  Constant pain,stabbing pain that get worse at night lower back and right side in rib area right under breast. Having trouble laying sitting or walking.  Coughing is also worse at night.    Patient in office for an acute visit, patient complaining of cough, congestion, pain lower back and right side. Patient reports symptoms have worsened over the past 3 days. Has been using her inhalers, Flonase , and ibuprofen as needed. Will send in Augmentin , Tessalon  pearls, and a medrol  dose pack. Drink plenty of water .  Patient understands if she feels worse over the weekend, she is to go to the ED.     No other concerns at this time.   Past Medical History:  Diagnosis Date   Bowel obstruction (HCC)    x1 month ago   Cancer [facility])    stomach cancer per pt and currently lung ca   COPD (chronic obstructive pulmonary disease) (HCC)    Essential hypertension 12/02/2019   Lung cancer (HCC)    Personal history of radiation therapy 2019-2020   lung ca    Past Surgical History:  Procedure Laterality Date   COLON SURGERY      Social History   Socioeconomic History   Marital status: Widowed    Spouse name: Not on file   Number of children: 2   Years of education: 74   Highest education level: Not on file  Occupational History   Occupation: on disability  Tobacco Use   Smoking status: Every Day    Current packs/day: 0.25    Types: Cigarettes   Smokeless tobacco: Never  Vaping Use   Vaping status: Never Used  Substance and Sexual Activity   Alcohol use: Yes    Comment: occas   Drug use: No   Sexual activity: Not Currently    Birth control/protection: Post-menopausal  Other Topics Concern   Not on file  Social History  Narrative   Not on file   Social Drivers of Health   Tobacco Use: High Risk (03/01/2024)   Patient History    Smoking Tobacco Use: Every Day    Smokeless Tobacco Use: Never    Passive Exposure: Not on file  Financial Resource Strain: Not on file  Food Insecurity: No Food Insecurity (11/22/2022)   Hunger Vital Sign    Worried About Running Out of Food in the Last Year: Never true    Ran Out of Food in the Last Year: Never true  Transportation Needs: No Transportation Needs (11/22/2022)   PRAPARE - Administrator, Civil Service (Medical): No    Lack of Transportation (Non-Medical): No  Physical Activity: Not on file  Stress: Not on file  Social Connections: Not on file  Intimate Partner Violence: Not At Risk (11/22/2022)   Humiliation, Afraid, Rape, and Kick questionnaire    Fear of Current or Ex-Partner: No    Emotionally Abused: No    Physically Abused: No    Sexually Abused: No  Depression (PHQ2-9): High Risk (09/04/2023)   Depression (PHQ2-9)    PHQ-2 Score: 13  Alcohol Screen: Not on file  Housing: Low Risk (11/22/2022)   Housing    Last Housing Risk Score: 0  Utilities:  Not At Risk (11/22/2022)   [employer] Utilities    Threatened with loss of utilities: No  Health Literacy: Not on file    Family History  Problem Relation Age of Onset   Lupus Sister    Heart failure Neg Hx    Diabetes Neg Hx     Allergies[1]  Show/hide medication list[2]  Review of Systems  Constitutional:  Positive for malaise/fatigue. Negative for fever.  HENT:  Positive for congestion. Negative for sinus pain and sore throat.   Eyes: Negative.   Respiratory:  Positive for cough, sputum production and shortness of breath.   Cardiovascular: Negative.  Negative for chest pain.  Gastrointestinal: Negative.  Negative for abdominal pain, constipation and diarrhea.  Genitourinary: Negative.   Musculoskeletal:  Negative for joint pain and myalgias.  Skin: Negative.   Neurological:  Negative.  Negative for dizziness and headaches.  Endo/Heme/Allergies: Negative.   All other systems reviewed and are negative.      Objective:   BP 130/80   Pulse 86   Ht 5' 5 (1.651 m)   SpO2 97%   BMI 15.78 kg/m   Vitals:   03/01/24 1328  BP: 130/80  Pulse: 86  Height: 5' 5 (1.651 m)  SpO2: 97%    Physical Exam Vitals and nursing note reviewed.  Constitutional:      Appearance: Normal appearance. She is normal weight.  HENT:     Head: Normocephalic and atraumatic.     Nose: Nose normal.     Mouth/Throat:     Mouth: Mucous membranes are moist.  Eyes:     Extraocular Movements: Extraocular movements intact.     Conjunctiva/sclera: Conjunctivae normal.     Pupils: Pupils are equal, round, and reactive to light.  Cardiovascular:     Rate and Rhythm: Normal rate and regular rhythm.     Pulses: Normal pulses.     Heart sounds: Normal heart sounds.  Pulmonary:     Effort: Pulmonary effort is normal.     Breath sounds: Normal breath sounds. No stridor. No wheezing, rhonchi or rales.  Abdominal:     General: Abdomen is flat. Bowel sounds are normal.     Palpations: Abdomen is soft.  Musculoskeletal:        General: Normal range of motion.     Cervical back: Normal range of motion.  Skin:    General: Skin is warm and dry.  Neurological:     General: No focal deficit present.     Mental Status: She is alert and oriented to person, place, and time.  Psychiatric:        Mood and Affect: Mood normal.        Behavior: Behavior normal.        Thought Content: Thought content normal.        Judgment: Judgment normal.      No results found for any visits on 03/01/24.  Recent Results (from the past 2160 hours)  CMP14+EGFR     Status: Abnormal   Collection Time: 02/08/24 10:17 AM  Result Value Ref Range   Glucose 87 70 - 99 mg/dL   BUN 13 8 - 27 mg/dL   Creatinine, Ser 9.49 (L) 0.57 - 1.00 mg/dL   eGFR 896 >40 fO/fpw/8.26   BUN/Creatinine Ratio 26 12 - 28    Sodium 143 134 - 144 mmol/L   Potassium 4.0 3.5 - 5.2 mmol/L   Chloride 112 (H) 96 - 106 mmol/L   CO2 19 (L) 20 -  29 mmol/L   Calcium  7.6 (L) 8.7 - 10.3 mg/dL   Total Protein 6.0 6.0 - 8.5 g/dL   Albumin 3.9 3.9 - 4.9 g/dL   Globulin, Total 2.1 1.5 - 4.5 g/dL   Bilirubin Total 0.3 0.0 - 1.2 mg/dL   Alkaline Phosphatase 417 (H) 49 - 135 IU/L   AST 15 0 - 40 IU/L   ALT 22 0 - 32 IU/L  Lipid Profile     Status: Abnormal   Collection Time: 02/08/24 10:17 AM  Result Value Ref Range   Cholesterol, Total 88 (L) 100 - 199 mg/dL   Triglycerides 39 0 - 149 mg/dL   HDL 45 >60 mg/dL   VLDL Cholesterol Cal 11 5 - 40 mg/dL   LDL Chol Calc (NIH) 32 0 - 99 mg/dL   Chol/HDL Ratio 2.0 0.0 - 4.4 ratio    Comment:                                   T. Chol/HDL Ratio                                             Men  Women                               1/2 Avg.Risk  3.4    3.3                                   Avg.Risk  5.0    4.4                                2X Avg.Risk  9.6    7.1                                3X Avg.Risk 23.4   11.0   Hemoglobin A1c     Status: Abnormal   Collection Time: 02/08/24 10:17 AM  Result Value Ref Range   Hgb A1c MFr Bld 4.7 (L) 4.8 - 5.6 %    Comment:          Prediabetes: 5.7 - 6.4          Diabetes: >6.4          Glycemic control for adults with diabetes: <7.0    Est. average glucose Bld gHb Est-mCnc 88 mg/dL  CBC with Differential/Platelet     Status: Abnormal   Collection Time: 02/08/24 10:17 AM  Result Value Ref Range   WBC 6.9 3.4 - 10.8 x10E3/uL   RBC 3.53 (L) 3.77 - 5.28 x10E6/uL   Hemoglobin 12.1 11.1 - 15.9 g/dL   Hematocrit 63.2 65.9 - 46.6 %   MCV 104 (H) 79 - 97 fL   MCH 34.3 (H) 26.6 - 33.0 pg   MCHC 33.0 31.5 - 35.7 g/dL   RDW 88.5 (L) 88.2 - 84.5 %   Platelets 190 150 - 450 x10E3/uL   Neutrophils 69 Not Estab. %   Lymphs 25 Not Estab. %   Monocytes 6 Not Estab. %  Eos 0 Not Estab. %   Basos 0 Not Estab. %   Neutrophils Absolute 4.7 1.4  - 7.0 x10E3/uL   Lymphocytes Absolute 1.8 0.7 - 3.1 x10E3/uL   Monocytes Absolute 0.4 0.1 - 0.9 x10E3/uL   EOS (ABSOLUTE) 0.0 0.0 - 0.4 x10E3/uL   Basophils Absolute 0.0 0.0 - 0.2 x10E3/uL   Immature Granulocytes 0 Not Estab. %   Immature Grans (Abs) 0.0 0.0 - 0.1 x10E3/uL  TSH     Status: None   Collection Time: 02/08/24 10:17 AM  Result Value Ref Range   TSH 0.773 0.450 - 4.500 uIU/mL      Assessment & Plan:  Augmentin  Tessalon  pearls Medrol  doe pack Supportive care Drink plenty of water   Problem List Items Addressed This Visit       Cardiovascular and Mediastinum   Essential hypertension     Respiratory   Lower resp. tract infection - Primary    Return in about 2 months (around 04/29/2024).   Total time spent: 25 minutes. This time includes review of previous notes and results and patient face to face interaction during today's visit.    Jeoffrey Pollen, NP  03/01/2024   This document may have been prepared by [facility] Voice Recognition software and as such may include unintentional dictation errors.     [1]  Allergies Allergen Reactions   Aspirin Hives and Itching  [2]  Outpatient Medications Prior to Visit  Medication Sig   albuterol  (PROVENTIL ) (2.5 MG/3ML) 0.083% nebulizer solution Take 3 mLs (2.5 mg total) by nebulization 3 (three) times daily as needed for wheezing.   albuterol  (VENTOLIN  HFA) 108 (90 Base) MCG/ACT inhaler Inhale 1 puff into the lungs 2 (two) times daily as needed.   amLODipine  (NORVASC ) 10 MG tablet TAKE 1 TABLET BY MOUTH ONCE DAILY   atorvastatin  (LIPITOR) 40 MG tablet TAKE 1 TABLET BY MOUTH ONCE EVERY EVENING   buPROPion  ER (WELLBUTRIN  SR) 100 MG 12 hr tablet Take 1 tablet (100 mg total) by mouth 2 (two) times daily.   cholestyramine  (QUESTRAN ) 4 GM/DOSE powder Take 1 packet (4 g total) by mouth daily.   CREON  12000-38000 units CPEP capsule TAKE 1 CAPSULE BY MOUTH 3 TIMES DAILY BEFORE MEALS   cyanocobalamin  (VITAMIN B12) 250 MCG tablet  Take 1 tablet (250 mcg total) by mouth daily.   cycloSPORINE  (RESTASIS ) 0.05 % ophthalmic emulsion Place 1 drop into both eyes every 12 (twelve) hours.   feeding supplement, ENSURE ENLIVE, (ENSURE ENLIVE) LIQD Take 237 mLs by mouth 2 (two) times daily between meals.   fluticasone  (FLONASE ) 50 MCG/ACT nasal spray USE 1 SPRAY IN EACH NOSTRIL 3 TIMES DAILY   furosemide  (LASIX ) 20 MG tablet Take 1 tablet (20 mg total) by mouth daily.   hydrOXYzine (ATARAX/VISTARIL) 25 MG tablet Take 25 mg by mouth 3 (three) times daily as needed.   ibuprofen (ADVIL) 100 MG/5ML suspension Take 200 mg by mouth every 4 (four) hours as needed for moderate pain (pain score 4-6).   ketorolac  (TORADOL ) 10 MG tablet Take 1 tablet (10 mg total) by mouth every 6 (six) hours as needed.   lisinopril  (ZESTRIL ) 40 MG tablet Take 1 tablet (40 mg total) by mouth daily.   mirtazapine  (REMERON ) 15 MG tablet Take 1 tablet (15 mg total) by mouth daily.   ondansetron  (ZOFRAN ) 4 MG tablet Take 1 tablet (4 mg total) by mouth every 8 (eight) hours as needed for up to 10 doses for nausea or vomiting.   pantoprazole  (PROTONIX ) 40 MG  tablet TAKE 1 TABLET BY MOUTH ONCE DAILY   pregabalin  (LYRICA ) 100 MG capsule Take 100 mg by mouth 3 (three) times daily.    QUEtiapine  (SEROQUEL ) 100 MG tablet Take 1 tablet (100 mg total) by mouth at bedtime.   sertraline  (ZOLOFT ) 50 MG tablet TAKE 1 TABLET BY MOUTH TWICE DAILY   Vitamin D , Ergocalciferol , (DRISDOL ) 1.25 MG (50000 UNIT) CAPS capsule Take 1 capsule (50,000 Units total) by mouth once a week.   [DISCONTINUED] benzonatate  (TESSALON ) 100 MG capsule TAKE 2 CAPSULES BY MOUTH 3 TIMES DAILY AS NEEDED FOR COUGH   acetaminophen  (TYLENOL ) 160 MG/5ML solution Take 5 mLs (160 mg total) by mouth every 6 (six) hours as needed for mild pain. (Patient not taking: Reported on 11/24/2023)   bisacodyl  (DULCOLAX) 5 MG EC tablet Take 1 tablet (5 mg total) by mouth daily as needed for moderate constipation. (Patient not  taking: Reported on 11/24/2023)   docusate sodium  (COLACE) 100 MG capsule Take 1 capsule (100 mg total) by mouth 2 (two) times daily. (Patient not taking: Reported on 11/24/2023)   polyethylene glycol (MIRALAX  / GLYCOLAX ) 17 g packet Take 17 g by mouth daily as needed for mild constipation. (Patient not taking: Reported on 11/24/2023)   No facility-administered medications prior to visit.   "
